# Patient Record
Sex: Male | Born: 1940
Health system: Southern US, Community
[De-identification: ages and names within clinical notes are randomized; demographics above are authoritative.]

## PROBLEM LIST (undated history)

## (undated) DIAGNOSIS — I10 Essential (primary) hypertension: Secondary | ICD-10-CM

## (undated) DIAGNOSIS — I6529 Occlusion and stenosis of unspecified carotid artery: Secondary | ICD-10-CM

## (undated) DIAGNOSIS — I639 Cerebral infarction, unspecified: Secondary | ICD-10-CM

## (undated) DIAGNOSIS — K219 Gastro-esophageal reflux disease without esophagitis: Secondary | ICD-10-CM

## (undated) HISTORY — DX: Occlusion and stenosis of unspecified carotid artery: I65.29

## (undated) HISTORY — PX: PACEMAKER IMPLANT: EP1218

## (undated) HISTORY — PX: TONSILLECTOMY: SUR1361

## (undated) HISTORY — PX: HERNIA REPAIR: SHX51

## (undated) HISTORY — PX: CATARACT EXTRACTION, BILATERAL: SHX1313

---

## 2020-09-11 ENCOUNTER — Emergency Department (HOSPITAL_COMMUNITY): Payer: Medicare Other

## 2020-09-11 ENCOUNTER — Other Ambulatory Visit: Payer: Self-pay

## 2020-09-11 ENCOUNTER — Encounter (HOSPITAL_COMMUNITY): Payer: Self-pay

## 2020-09-11 ENCOUNTER — Inpatient Hospital Stay (HOSPITAL_COMMUNITY)
Admission: EM | Admit: 2020-09-11 | Discharge: 2020-09-14 | DRG: 069 | Disposition: A | Discharge status: Home / Self Care | Payer: Medicare Other | Attending: Internal Medicine | Admitting: Internal Medicine

## 2020-09-11 DIAGNOSIS — Z950 Presence of cardiac pacemaker: Secondary | ICD-10-CM

## 2020-09-11 DIAGNOSIS — R4701 Aphasia: Secondary | ICD-10-CM | POA: Diagnosis present

## 2020-09-11 DIAGNOSIS — E785 Hyperlipidemia, unspecified: Secondary | ICD-10-CM | POA: Diagnosis present

## 2020-09-11 DIAGNOSIS — I10 Essential (primary) hypertension: Secondary | ICD-10-CM | POA: Diagnosis present

## 2020-09-11 DIAGNOSIS — K219 Gastro-esophageal reflux disease without esophagitis: Secondary | ICD-10-CM | POA: Diagnosis present

## 2020-09-11 DIAGNOSIS — G459 Transient cerebral ischemic attack, unspecified: Secondary | ICD-10-CM | POA: Diagnosis not present

## 2020-09-11 DIAGNOSIS — D649 Anemia, unspecified: Secondary | ICD-10-CM | POA: Diagnosis present

## 2020-09-11 DIAGNOSIS — Z87891 Personal history of nicotine dependence: Secondary | ICD-10-CM

## 2020-09-11 DIAGNOSIS — Z20822 Contact with and (suspected) exposure to covid-19: Secondary | ICD-10-CM | POA: Diagnosis present

## 2020-09-11 DIAGNOSIS — Z823 Family history of stroke: Secondary | ICD-10-CM

## 2020-09-11 DIAGNOSIS — I6523 Occlusion and stenosis of bilateral carotid arteries: Secondary | ICD-10-CM | POA: Diagnosis present

## 2020-09-11 DIAGNOSIS — I4892 Unspecified atrial flutter: Secondary | ICD-10-CM | POA: Diagnosis present

## 2020-09-11 DIAGNOSIS — I480 Paroxysmal atrial fibrillation: Secondary | ICD-10-CM | POA: Diagnosis present

## 2020-09-11 DIAGNOSIS — I471 Supraventricular tachycardia: Secondary | ICD-10-CM | POA: Diagnosis not present

## 2020-09-11 HISTORY — DX: Gastro-esophageal reflux disease without esophagitis: K21.9

## 2020-09-11 HISTORY — DX: Essential (primary) hypertension: I10

## 2020-09-11 LAB — CBC
HCT: 31.5 % — ABNORMAL LOW (ref 39.0–52.0)
Hemoglobin: 10.9 g/dL — ABNORMAL LOW (ref 13.0–17.0)
MCH: 34.1 pg — ABNORMAL HIGH (ref 26.0–34.0)
MCHC: 34.6 g/dL (ref 30.0–36.0)
MCV: 98.4 fL (ref 80.0–100.0)
Platelets: 156 10*3/uL (ref 150–400)
RBC: 3.2 MIL/uL — ABNORMAL LOW (ref 4.22–5.81)
RDW: 13.2 % (ref 11.5–15.5)
WBC: 4.1 10*3/uL (ref 4.0–10.5)
nRBC: 0 % (ref 0.0–0.2)

## 2020-09-11 LAB — I-STAT CHEM 8, ED
BUN: 29 mg/dL — ABNORMAL HIGH (ref 8–23)
Calcium, Ion: 1.12 mmol/L — ABNORMAL LOW (ref 1.15–1.40)
Chloride: 106 mmol/L (ref 98–111)
Creatinine, Ser: 1.2 mg/dL (ref 0.61–1.24)
Glucose, Bld: 112 mg/dL — ABNORMAL HIGH (ref 70–99)
HCT: 29 % — ABNORMAL LOW (ref 39.0–52.0)
Hemoglobin: 9.9 g/dL — ABNORMAL LOW (ref 13.0–17.0)
Potassium: 4 mmol/L (ref 3.5–5.1)
Sodium: 139 mmol/L (ref 135–145)
TCO2: 23 mmol/L (ref 22–32)

## 2020-09-11 LAB — COMPREHENSIVE METABOLIC PANEL
ALT: 23 U/L (ref 0–44)
AST: 24 U/L (ref 15–41)
Albumin: 3.4 g/dL — ABNORMAL LOW (ref 3.5–5.0)
Alkaline Phosphatase: 86 U/L (ref 38–126)
Anion gap: 7 (ref 5–15)
BUN: 30 mg/dL — ABNORMAL HIGH (ref 8–23)
CO2: 23 mmol/L (ref 22–32)
Calcium: 8.5 mg/dL — ABNORMAL LOW (ref 8.9–10.3)
Chloride: 105 mmol/L (ref 98–111)
Creatinine, Ser: 1.21 mg/dL (ref 0.61–1.24)
GFR, Estimated: >60 mL/min (ref >60)
Glucose, Bld: 114 mg/dL — ABNORMAL HIGH (ref 70–99)
Potassium: 4 mmol/L (ref 3.5–5.1)
Sodium: 135 mmol/L (ref 135–145)
Total Bilirubin: 0.9 mg/dL (ref 0.3–1.2)
Total Protein: 5.7 g/dL — ABNORMAL LOW (ref 6.5–8.1)

## 2020-09-11 LAB — CBG MONITORING, ED: Glucose-Capillary: 106 mg/dL — ABNORMAL HIGH (ref 70–99)

## 2020-09-11 LAB — DIFFERENTIAL
Abs Immature Granulocytes: 0.01 10*3/uL (ref 0.00–0.07)
Basophils Absolute: 0 10*3/uL (ref 0.0–0.1)
Basophils Relative: 1 %
Eosinophils Absolute: 0 10*3/uL (ref 0.0–0.5)
Eosinophils Relative: 0 %
Immature Granulocytes: 0 %
Lymphocytes Relative: 40 %
Lymphs Abs: 1.6 10*3/uL (ref 0.7–4.0)
Monocytes Absolute: 0.4 10*3/uL (ref 0.1–1.0)
Monocytes Relative: 10 %
Neutro Abs: 2 10*3/uL (ref 1.7–7.7)
Neutrophils Relative %: 49 %

## 2020-09-11 MED ORDER — IOHEXOL 350 MG/ML SOLN
100.0000 mL | Freq: Once | INTRAVENOUS | Status: AC | PRN
  Administered 2020-09-11: 100 mL via INTRAVENOUS

## 2020-09-11 NOTE — ED Triage Notes (Signed)
Patient brought in by ems LKW 2000 patient had a headache and was unable to find his words wife noted slurred speech as well around 2030 EMS arrival 2053 and noticed that speech was starting to clear up. Family history of TIA and Strokes.

## 2020-09-11 NOTE — ED Provider Notes (Signed)
[facility] EMERGENCY DEPARTMENT Provider Note   CSN: [id number] Arrival date & time: 09/11/20  2145     History Chief Complaint  Patient presents with  . Headache  . Aphasia    [name] is a 79 y.o. male.  Patient presents to the emergency department with a chief complaint of garbled speech.  He states the symptoms occurred at approximately 8 PM tonight.  The symptoms lasted for 20 to 30 minutes.  Symptoms were preceded by mild headache.  He denies having had numbness, weakness, or tingling in his extremities.  Denies having had any vision changes.  Denies any chest pain, shortness of breath, difficulty swallowing.  He denies any history of TIA or stroke.  He does have family history of strokes.  He takes a low-dose aspirin.  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  The history is provided by the patient. No language interpreter was used.       Past Medical History:  Diagnosis Date  . GERD (gastroesophageal reflux disease)   . Hypertension     There are no problems to display for this patient.   Past Surgical History:  Procedure Laterality Date  . HERNIA REPAIR    . PACEMAKER IMPLANT    . TONSILLECTOMY         No family history on file.  Social History   Tobacco Use  . Smoking status: Not on file  Substance Use Topics  . Alcohol use: Yes  . Drug use: Not on file    Home Medications Prior to Admission medications   Not on File    Allergies    Patient has no known allergies.  Review of Systems   Review of Systems  All other systems reviewed and are negative.   Physical Exam Updated Vital Signs BP (!) 145/61 (BP Location: Right Arm)   Pulse 60   Temp 98.6 F (37 C) (Oral)   Resp 18   Ht 5\' 10"  (1.778 m)   Wt 72.6 kg   SpO2 98%   BMI 22.96 kg/m   Physical Exam Vitals and nursing note reviewed.  Constitutional:      Appearance: He is well-developed.  HENT:     Head: Normocephalic and atraumatic.  Eyes:      Conjunctiva/sclera: Conjunctivae normal.  Cardiovascular:     Rate and Rhythm: Normal rate and regular rhythm.     Heart sounds: No murmur heard.   Pulmonary:     Effort: Pulmonary effort is normal. No respiratory distress.     Breath sounds: Normal breath sounds.  Abdominal:     Palpations: Abdomen is soft.     Tenderness: There is no abdominal tenderness.  Musculoskeletal:     Cervical back: Neck supple.  Skin:    General: Skin is warm and dry.  Neurological:     Mental Status: He is alert.     Comments: CN III-XII intact, speech is clear, normal finger-to-nose, no pronator drift, range of motion and strength of extremities is 5/5     ED Results / Procedures / Treatments   Labs (all labs ordered are listed, but only abnormal results are displayed) Labs Reviewed  CBG MONITORING, ED - Abnormal; Notable for the following components:      Result Value   Glucose-Capillary 106 (*)    All other components within normal limits    EKG None  Radiology No results found.  Procedures Procedures (including critical care time)  Medications Ordered in  ED Medications - No data to display  ED Course  I have reviewed the triage vital signs and the nursing notes.  Pertinent labs & imaging results that were available during my care of the patient were reviewed by me and considered in my medical decision making (see chart for details).  Clinical Course as of Sep 11 2229  Sat Sep 11, 2020  2216 This is an 50-year-old male present emerge department garbled speech for approximately 20 minutes this evening.  He reports he was feeling well at about 6 PM, and was talking to his wife and his speech became confused.  He said he was able to get the words out but he was mixing up words.  He denies any numbness or weakness at the time.  He denies this ever happened before.  Denies any history of stroke or TIA.  He reports he since returned to his normal baseline mental status, which is also  confirmed by his wife.  On exam he is in no acute distress.  He has a completely benign neurological exam, with no expressive or receptive aphasia.  This presentation continues to be concerning for TIA. He has no active symptoms of stroke at this time.  We'll obtain CT imaging, discuss with neurology, and keep a close eye on him   [MT]  2218 Glucose-Capillary(!): 106 [MT]    Clinical Course User Index [MT] [name], MD   MDM Rules/Calculators/A&P                          This patient complains of garbled speech, this involves an extensive number of treatment options, and is a complaint that carries with it a high risk of complications and morbidity.    Differential Dx TIA, stroke, intoxication, hypoglycemia  Pertinent Labs I ordered, reviewed, and interpreted labs, which included CBG, CBC, CMP, ethanol.  Labs are fairly reassuring.  Imaging Interpretation I ordered imaging studies which included CTA head and neck, which showed no evidence of emergent large vessel occlusion.  Other findings as listed above..   Sources Additional history obtained from spouse, states that patient had trouble saying the right word for about 20 to 30 minutes.  Consultants Dr. Thomasena Edis- Neurology - Recommends adding CTA head and neck.  Will see the patient.  Plan Admit for TIA work-up.    Final Clinical Impression(s) / ED Diagnoses Final diagnoses:  TIA (transient ischemic attack)    Rx / DC Orders ED Discharge Orders    None       [name], PA-C 09/12/20 [id number]    [name], MD 09/12/20 1115

## 2020-09-12 ENCOUNTER — Encounter (HOSPITAL_COMMUNITY): Payer: Self-pay | Admitting: Neurology

## 2020-09-12 ENCOUNTER — Observation Stay (HOSPITAL_COMMUNITY): Payer: Medicare Other

## 2020-09-12 DIAGNOSIS — Z20822 Contact with and (suspected) exposure to covid-19: Secondary | ICD-10-CM | POA: Diagnosis present

## 2020-09-12 DIAGNOSIS — I6389 Other cerebral infarction: Secondary | ICD-10-CM

## 2020-09-12 DIAGNOSIS — I471 Supraventricular tachycardia: Secondary | ICD-10-CM | POA: Diagnosis not present

## 2020-09-12 DIAGNOSIS — Z87891 Personal history of nicotine dependence: Secondary | ICD-10-CM | POA: Diagnosis not present

## 2020-09-12 DIAGNOSIS — G459 Transient cerebral ischemic attack, unspecified: Secondary | ICD-10-CM | POA: Diagnosis present

## 2020-09-12 DIAGNOSIS — E785 Hyperlipidemia, unspecified: Secondary | ICD-10-CM | POA: Diagnosis present

## 2020-09-12 DIAGNOSIS — R4701 Aphasia: Secondary | ICD-10-CM | POA: Diagnosis present

## 2020-09-12 DIAGNOSIS — I482 Chronic atrial fibrillation, unspecified: Secondary | ICD-10-CM | POA: Diagnosis not present

## 2020-09-12 DIAGNOSIS — Z823 Family history of stroke: Secondary | ICD-10-CM | POA: Diagnosis not present

## 2020-09-12 DIAGNOSIS — I6523 Occlusion and stenosis of bilateral carotid arteries: Secondary | ICD-10-CM | POA: Diagnosis present

## 2020-09-12 DIAGNOSIS — I351 Nonrheumatic aortic (valve) insufficiency: Secondary | ICD-10-CM

## 2020-09-12 DIAGNOSIS — I4892 Unspecified atrial flutter: Secondary | ICD-10-CM | POA: Diagnosis present

## 2020-09-12 DIAGNOSIS — D649 Anemia, unspecified: Secondary | ICD-10-CM | POA: Diagnosis present

## 2020-09-12 DIAGNOSIS — I480 Paroxysmal atrial fibrillation: Secondary | ICD-10-CM | POA: Diagnosis present

## 2020-09-12 DIAGNOSIS — K219 Gastro-esophageal reflux disease without esophagitis: Secondary | ICD-10-CM | POA: Diagnosis present

## 2020-09-12 DIAGNOSIS — I6522 Occlusion and stenosis of left carotid artery: Secondary | ICD-10-CM | POA: Diagnosis not present

## 2020-09-12 DIAGNOSIS — Z950 Presence of cardiac pacemaker: Secondary | ICD-10-CM

## 2020-09-12 DIAGNOSIS — R4182 Altered mental status, unspecified: Secondary | ICD-10-CM | POA: Diagnosis not present

## 2020-09-12 DIAGNOSIS — I10 Essential (primary) hypertension: Secondary | ICD-10-CM | POA: Diagnosis present

## 2020-09-12 LAB — LIPID PANEL
Cholesterol: 110 mg/dL (ref 0–200)
HDL: 36 mg/dL — ABNORMAL LOW (ref >40)
LDL Cholesterol: 58 mg/dL (ref 0–99)
Total CHOL/HDL Ratio: 3.1 RATIO
Triglycerides: 80 mg/dL (ref <150)
VLDL: 16 mg/dL (ref 0–40)

## 2020-09-12 LAB — RETICULOCYTES
Immature Retic Fract: 13.4 % (ref 2.3–15.9)
RBC.: 3.21 MIL/uL — ABNORMAL LOW (ref 4.22–5.81)
Retic Count, Absolute: 28.6 10*3/uL (ref 19.0–186.0)
Retic Ct Pct: 0.9 % (ref 0.4–3.1)

## 2020-09-12 LAB — RAPID URINE DRUG SCREEN, HOSP PERFORMED
Amphetamines: NOT DETECTED
Barbiturates: NOT DETECTED
Benzodiazepines: NOT DETECTED
Cocaine: NOT DETECTED
Opiates: NOT DETECTED
Tetrahydrocannabinol: NOT DETECTED

## 2020-09-12 LAB — URINALYSIS, ROUTINE W REFLEX MICROSCOPIC
Bilirubin Urine: NEGATIVE
Glucose, UA: NEGATIVE mg/dL
Hgb urine dipstick: NEGATIVE
Ketones, ur: NEGATIVE mg/dL
Leukocytes,Ua: NEGATIVE
Nitrite: NEGATIVE
Protein, ur: NEGATIVE mg/dL
Specific Gravity, Urine: 1.017 (ref 1.005–1.030)
pH: 7 (ref 5.0–8.0)

## 2020-09-12 LAB — ECHOCARDIOGRAM COMPLETE
AR max vel: 2.52 cm2
AV Area VTI: 2.27 cm2
AV Area mean vel: 2.08 cm2
AV Mean grad: 4 mmHg
AV Peak grad: 6.2 mmHg
Ao pk vel: 1.24 m/s
Area-P 1/2: 5.97 cm2
Height: 70 in
P 1/2 time: 458 msec
S' Lateral: 3.8 cm
Weight: 2560 oz

## 2020-09-12 LAB — ETHANOL: Alcohol, Ethyl (B): <10 mg/dL (ref <10)

## 2020-09-12 LAB — HEMOGLOBIN A1C
Hgb A1c MFr Bld: 5.4 % (ref 4.8–5.6)
Mean Plasma Glucose: 108.28 mg/dL

## 2020-09-12 LAB — RESPIRATORY PANEL BY RT PCR (FLU A&B, COVID)
Influenza A by PCR: NEGATIVE
Influenza B by PCR: NEGATIVE
SARS Coronavirus 2 by RT PCR: NEGATIVE

## 2020-09-12 LAB — PROTIME-INR
INR: 1.1 (ref 0.8–1.2)
Prothrombin Time: 13.5 seconds (ref 11.4–15.2)

## 2020-09-12 LAB — APTT: aPTT: 37 seconds — ABNORMAL HIGH (ref 24–36)

## 2020-09-12 MED ORDER — DM-GUAIFENESIN ER 30-600 MG PO TB12
1.0000 | ORAL_TABLET | Freq: Two times a day (BID) | ORAL | Status: DC | PRN
  Filled 2020-09-12: qty 1

## 2020-09-12 MED ORDER — ENOXAPARIN SODIUM 40 MG/0.4ML ~~LOC~~ SOLN
40.0000 mg | SUBCUTANEOUS | Status: DC
  Administered 2020-09-12 – 2020-09-14 (×3): 40 mg via SUBCUTANEOUS
  Filled 2020-09-12 (×3): qty 0.4

## 2020-09-12 MED ORDER — SENNOSIDES-DOCUSATE SODIUM 8.6-50 MG PO TABS: 1.0000 | ORAL_TABLET | Freq: Every evening | ORAL | Status: DC | PRN

## 2020-09-12 MED ORDER — ASPIRIN EC 81 MG PO TBEC
81.0000 mg | DELAYED_RELEASE_TABLET | Freq: Every day | ORAL | Status: DC
  Administered 2020-09-12 – 2020-09-14 (×3): 81 mg via ORAL
  Filled 2020-09-12 (×3): qty 1

## 2020-09-12 MED ORDER — ACETAMINOPHEN 325 MG PO TABS: 650.0000 mg | ORAL_TABLET | ORAL | Status: DC | PRN

## 2020-09-12 MED ORDER — ACETAMINOPHEN 160 MG/5ML PO SOLN: 650.0000 mg | ORAL | Status: DC | PRN

## 2020-09-12 MED ORDER — STROKE: EARLY STAGES OF RECOVERY BOOK
Freq: Once | Status: AC
  Filled 2020-09-12: qty 1

## 2020-09-12 MED ORDER — ACETAMINOPHEN 650 MG RE SUPP: 650.0000 mg | RECTAL | Status: DC | PRN

## 2020-09-12 MED ORDER — ATORVASTATIN CALCIUM 40 MG PO TABS
40.0000 mg | ORAL_TABLET | Freq: Every day | ORAL | Status: DC
  Administered 2020-09-12 – 2020-09-13 (×2): 40 mg via ORAL
  Filled 2020-09-12 (×2): qty 1

## 2020-09-12 MED ORDER — CLOPIDOGREL BISULFATE 75 MG PO TABS
75.0000 mg | ORAL_TABLET | Freq: Every day | ORAL | Status: DC
  Administered 2020-09-12 – 2020-09-14 (×3): 75 mg via ORAL
  Filled 2020-09-12 (×3): qty 1

## 2020-09-12 NOTE — Progress Notes (Signed)
Talked to [facility] [facility] rep and no atrial fib, did have SVT on 09/01/20 - interrogation will be placed on chart tomorrow.  Dr. Mayford Knife reviewed and one area on the 22nd may have been a fib, will have EP eval the pacer interrogation tomorrow.

## 2020-09-12 NOTE — Evaluation (Signed)
Speech Language Pathology Evaluation Patient Details Name: [name] MRN: [id number] DOB: 08-Nov-1941 Today's Date: 09/12/2020 Time: 7096-2836 SLP Time Calculation (min) (ACUTE ONLY): 15 min  Problem List:  Patient Active Problem List   Diagnosis Date Noted  . 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 Essential hypertension 09/12/2020  . Anemia 09/12/2020  . Aphasia 09/12/2020  . TIA (transient ischemic attack) 09/12/2020   Past Medical History:  Past Medical History:  Diagnosis Date  . GERD (gastroesophageal reflux disease)   . Hypertension    Past Surgical History:  Past Surgical History:  Procedure Laterality Date  . HERNIA REPAIR    . PACEMAKER IMPLANT    . TONSILLECTOMY     HPI:  79 year old with history of arrhythmia status post pacemaker, HTN, GERD admitted for slurred speech.  He follows with a cardiologist in [place].  Most of his work-up was unremarkable, LDL 58, A1c 5.4, UA, UDS both negative.  CTA head and neck negative for any high-grade stenosis or large vessel occlusion but showed left ICA plaque and thyroid nodule   Assessment / Plan / Recommendation Clinical Impression  Pt's language/cognition were assessed. Presents with clear, fluent speech that is back at baseline.  Expressive language is WNL. Comprehension WNL. Follows multistep commands; able to complete higher-level naming tasks.  Reading is WNL.  No aphasia, no persisting deficits. No SLP f/u needed. Reviewed the BE-FAST acronym.  SLP to sign off.     SLP Assessment  SLP Recommendation/Assessment: Patient does not need any further Speech Lanaguage Pathology Services    Follow Up Recommendations       Frequency and Duration           SLP Evaluation Cognition  Overall Cognitive Status: Within Functional Limits for tasks assessed Arousal/Alertness: Awake/alert Orientation Level: Oriented X4 Attention: Alternating Alternating Attention: Appears intact Memory: Appears  intact Safety/Judgment: Appears intact       Comprehension  Auditory Comprehension Overall Auditory Comprehension: Appears within functional limits for tasks assessed Yes/No Questions: Within Functional Limits Commands: Within Functional Limits Conversation: Complex Visual Recognition/Discrimination Discrimination: Within Function Limits Reading Comprehension Reading Status: Within funtional limits    Expression Expression Primary Mode of Expression: Verbal Verbal Expression Overall Verbal Expression: Appears within functional limits for tasks assessed Initiation: No impairment Level of Generative/Spontaneous Verbalization: Conversation Repetition: No impairment Naming: No impairment Pragmatics: No impairment Written Expression Written Expression: Not tested   Oral / Motor  Oral Motor/Sensory Function Overall Oral Motor/Sensory Function: Within functional limits Motor Speech Overall Motor Speech: Appears within functional limits for tasks assessed Respiration: Within functional limits Phonation: Normal Resonance: Within functional limits Articulation: Within functional limitis Intelligibility: Intelligible Motor Planning: Witnin functional limits   GO                    Blenda Mounts Laurice 09/12/2020, 4:35 PM

## 2020-09-12 NOTE — Progress Notes (Signed)
Occupational Therapy Evaluation Patient Details Name: [name] MRN: [id number] DOB: [birth year] Today's Date: 09/12/2020    History of Present Illness Pt presented with slurred speech and word finding difficulty. CT negative. Unable to undergo MRI due to pacer. PMH - pacer, HTN, scoliosis   Clinical Impression   PTA pt living with spouse, functioning at independent community level. Pt and spouse just moved to town for ILF. Their stuff is not yet fully moved in, but they will have a fully accessible apartment to return to post hospitalization. Pt demonstrates ability to complete ADL at mod I level, as well as functional mobility. He is slightly more unsteady than baseline, but able to self correct and be aware when rest breaks are warranted. Reviewed fall prevention strategies with both pt and wife. Reviewed s/s of stroke as well. No further OT needs identified, OT will sign off. Thank you for this consult.     Follow Up Recommendations  No OT follow up    Equipment Recommendations  None recommended by OT    Recommendations for Other Services       Precautions / Restrictions Precautions Precautions: Fall Restrictions Weight Bearing Restrictions: No      Mobility Bed Mobility Overal bed mobility: Modified Independent                  Transfers Overall transfer level: Modified independent                    Balance Overall balance assessment: Needs assistance Sitting-balance support: No upper extremity supported;Feet supported Sitting balance-Leahy Scale: Good     Standing balance support: No upper extremity supported;During functional activity Standing balance-Leahy Scale: Fair                             ADL either performed or assessed with clinical judgement   ADL Overall ADL's : Modified independent                                       General ADL Comments: pt demonstrates ability to complete ADL at mod I level  (increased time and effort)     Vision Patient Visual Report: No change from baseline       Perception     Praxis      Pertinent Vitals/Pain Pain Assessment: No/denies pain     Hand Dominance     Extremity/Trunk Assessment Upper Extremity Assessment Upper Extremity Assessment: Overall WFL for tasks assessed   Lower Extremity Assessment Lower Extremity Assessment: Generalized weakness       Communication Communication Communication: No difficulties   Cognition Arousal/Alertness: Awake/alert Behavior During Therapy: WFL for tasks assessed/performed Overall Cognitive Status: Within Functional Limits for tasks assessed                                     General Comments       Exercises     Shoulder Instructions      Home Living Family/patient expects to be discharged to:: Private residence Living Arrangements: Spouse/significant other Available Help at Discharge: Family;Available 24 hours/day Type of Home: Independent living facility Home Access: Level entry     Home Layout: One level     Bathroom Shower/Tub: Producer, television/film/video: Standard  Home Equipment: None   Additional Comments: Pt and spouse just moved from [place] to independent living apartment in a retirement complex  Lives With: Spouse    Prior Functioning/Environment Level of Independence: Independent                 OT Problem List: Decreased knowledge of use of DME or AE;Decreased activity tolerance;Impaired balance (sitting and/or standing)      OT Treatment/Interventions:      OT Goals(Current goals can be found in the care plan section) Acute Rehab OT Goals Patient Stated Goal: return to new home OT Goal Formulation: All assessment and education complete, DC therapy  OT Frequency:     Barriers to D/C:            Co-evaluation              AM-PAC OT "6 Clicks" Daily Activity     Outcome Measure Help from another person eating  meals?: None Help from another person taking care of personal grooming?: None Help from another person toileting, which includes using toliet, bedpan, or urinal?: None Help from another person bathing (including washing, rinsing, drying)?: None Help from another person to put on and taking off regular upper body clothing?: None Help from another person to put on and taking off regular lower body clothing?: None 6 Click Score: 24   End of Session Nurse Communication: Mobility status  Activity Tolerance: Patient tolerated treatment well Patient left: in chair;with family/visitor present;with call bell/phone within reach  OT Visit Diagnosis: Other abnormalities of gait and mobility (R26.89)                Time: 7209-4709 OT Time Calculation (min): 11 min Charges:  OT General Charges $OT Visit: 1 Visit OT Evaluation $OT Eval Low Complexity: 1 Low  Dalphine Handing, MSOT, OTR/L Acute Rehabilitation Services [facility] Office Number: [phone] Pager: [phone]  Dalphine Handing 09/12/2020, 5:55 PM

## 2020-09-12 NOTE — ED Notes (Signed)
Pt moved to [place]; report given to [place], Charity fundraiser.

## 2020-09-12 NOTE — Consult Note (Signed)
Neurology H&P  CC: word finding difficulty and slurred speech  History is obtained from: patient and his wife  HPI: [name] is a 79 y.o. male right handed recently moved to [facility] from [place] PMHx atrial fibrillation s/p pacemaker implantation 2016 was having dinner and suddenly developed word finding difficulty. Prior to symptoms patient started to feel off with associated headache ~2000 and his wife noticed that he unable to find his words and at times speech did not make sense. The had seen commercials on TV conveying symptoms of stroke and called EMS who noted improvement of speech on arrival at 2053.  His maternal and paternal family history includes TIA and Strokes.  LKW: 2000 tpa given?: No, symptoms resolved. IR Thrombectomy? No,  Modified Rankin Scale: 0-Completely asymptomatic and back to baseline post- stroke NIHSS: 1 very mild phonemic paraphasia.  ROS: A complete ROS was performed and is negative except as noted in the HPI.   Past Medical History:  Diagnosis Date  . GERD (gastroesophageal reflux disease)   . Hypertension    No family history on file.  Social History:  reports current alcohol use. No history on file for tobacco use and drug use.  Prior to Admission medications   Not on File   Exam: Current vital signs: BP 131/65   Pulse 60   Temp 98.6 F (37 C) (Oral)   Resp (!) 8   Ht 5\' 10"  (1.778 m)   Wt 72.6 kg   SpO2 93%   BMI 22.96 kg/m   Physical Exam  Constitutional: Appears well-developed and well-nourished.  Psych: Affect appropriate to situation Eyes: No scleral injection HENT: No OP obstrucion Head: Normocephalic.  Cardiovascular: Normal rate and regular rhythm.  Respiratory: Effort normal and breath sounds normal to anterior ascultation GI: Soft.  No distension. There is no tenderness.  Skin: WDI  Neuro: Mental Status: Patient is awake, alert, oriented to person, place, month, year, and situation. Patient is able to give a  clear and coherent history. Mild phonemic paraphasia. No  neglect Cranial Nerves: II: Visual Fields are full. Pupils are equal, round, and reactive to light.  III,IV, VI: EOMI without ptosis or diploplia.  V: Facial sensation is symmetric to temperature VII: Facial movement is symmetric.  VIII: hearing is intact to voice X: Uvula elevates symmetrically XI: Shoulder shrug is symmetric. XII: tongue is midline without atrophy or fasciculations.  Motor: Tone is normal. Bulk is normal. 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  Sensory: Sensation is symmetric to light touch and temperature in the arms and legs.  Deep Tendon Reflexes: 2+ and symmetric in the biceps and patellae.  Plantars: Toes are downgoing bilaterally.  Cerebellar: FNF and HKS are intact bilaterally.   Assessment: [name] is a 79 y.o. male right handed PMHx atrial fibrillation s/p pacemaker taking aspirin per cardiologist with acute onset aphasia which has mostly resolved. His wife said aphasia was severe and current exam revealed mild phonemic paraphasia and though he reports easy bruising in his arms, at this point benefit of short term clopidogrel outweighs risk.  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  Plan: - MRI brain without contrast. - CTA head and neck pending. - Recommend TTE. - Recommend labs: HbA1c, lipid panel, TSH. - Recommend Statin if LDL > 70 - Aspirin 81mg  daily per his cardiologist. - Clopidogrel 75mg  daily for 3 weeks unless thrombotic etiology is uncovered. - SBP goal -  Permissive hypertension first 24 h < 220/110. Hold home medications for now. - Telemetry monitoring per stroke protocol. - Recommend Stroke education. - Recommend PT/OT/SLP consult.   Electronically signed by: Dr. Marisue Humble Pager: [phone] 09/12/2020, 2:25 AM

## 2020-09-12 NOTE — Procedures (Signed)
Echo attempted. Patient eating. Will attempt again later. 

## 2020-09-12 NOTE — ED Notes (Signed)
ECHO at bedside.

## 2020-09-12 NOTE — Progress Notes (Signed)
Called by RN that pt had 12-13 beat run of V-tach that spontaneously resolved. Pt is asymptomatic. Hemodynamically stable and has no complaints.  Will monitor and if recurs will check potassium and magnesium levels.

## 2020-09-12 NOTE — Progress Notes (Signed)
Physical Therapy Evaluation Patient Details Name: [name] MRN: [id number] DOB: Dec 31, 1940 Today's Date: 09/12/2020   History of Present Illness  Pt presented with slurred speech and word finding difficulty. CT negative. Unable to undergo MRI due to pacer. PMH - pacer, HTN, scoliosis  Clinical Impression  Pt presents to PT with slightly unsteady gait due to illness and inactivity. Expect pt will make good progress back to baseline with mobility. Will follow acutely but doubt pt will need PT after DC.      Follow Up Recommendations No PT follow up    Equipment Recommendations  None recommended by PT    Recommendations for Other Services       Precautions / Restrictions Precautions Precautions: Fall      Mobility  Bed Mobility Overal bed mobility: Modified Independent                  Transfers Overall transfer level: Needs assistance Equipment used: None Transfers: Sit to/from Stand Sit to Stand: Supervision         General transfer comment: For safety and lines  Ambulation/Gait Ambulation/Gait assistance: Min guard Gait Distance (Feet): 175 Feet Assistive device: None Gait Pattern/deviations: Step-through pattern;Decreased stride length Gait velocity: decr Gait velocity interpretation: 1.31 - 2.62 ft/sec, indicative of limited community ambulator General Gait Details: Slightly unsteady gait initially without loss of balance. Improved with distance  Stairs            Wheelchair Mobility    Modified Rankin (Stroke Patients Only)       Balance Overall balance assessment: Needs assistance Sitting-balance support: No upper extremity supported;Feet supported Sitting balance-Leahy Scale: Good     Standing balance support: No upper extremity supported;During functional activity Standing balance-Leahy Scale: Fair                               Pertinent Vitals/Pain Pain Assessment: No/denies pain    Home Living Family/patient  expects to be discharged to:: Private residence Living Arrangements: Spouse/significant other Available Help at Discharge: Family;Available 24 hours/day Type of Home: Independent living facility Home Access: Level entry     Home Layout: One level Home Equipment: None Additional Comments: Pt and spouse just moved from [place] to independent living apartment in a retirement complex    Prior Function Level of Independence: Independent         Comments: Pt drives     Hand Dominance        Extremity/Trunk Assessment   Upper Extremity Assessment Upper Extremity Assessment: Defer to OT evaluation    Lower Extremity Assessment Lower Extremity Assessment: Generalized weakness       Communication   Communication: No difficulties  Cognition Arousal/Alertness: Awake/alert Behavior During Therapy: WFL for tasks assessed/performed Overall Cognitive Status: Within Functional Limits for tasks assessed                                        General Comments      Exercises     Assessment/Plan    PT Assessment Patient needs continued PT services  PT Problem List Decreased strength;Decreased balance;Decreased mobility       PT Treatment Interventions DME instruction;Gait training;Functional mobility training;Therapeutic activities;Therapeutic exercise;Balance training;Patient/family education    PT Goals (Current goals can be found in the Care Plan section)  Acute Rehab PT Goals Patient Stated Goal:  return to new home PT Goal Formulation: With patient Time For Goal Achievement: 09/19/20 Potential to Achieve Goals: Good    Frequency Min 3X/week   Barriers to discharge        Co-evaluation               AM-PAC PT "6 Clicks" Mobility  Outcome Measure Help needed turning from your back to your side while in a flat bed without using bedrails?: None Help needed moving from lying on your back to sitting on the side of a flat bed without using  bedrails?: None Help needed moving to and from a bed to a chair (including a wheelchair)?: A Little Help needed standing up from a chair using your arms (e.g., wheelchair or bedside chair)?: None Help needed to walk in hospital room?: A Little Help needed climbing 3-5 steps with a railing? : A Little 6 Click Score: 21    End of Session   Activity Tolerance: Patient tolerated treatment well Patient left: in chair;with call bell/phone within reach Nurse Communication: Mobility status PT Visit Diagnosis: Unsteadiness on feet (R26.81)    Time: 1007-1219 PT Time Calculation (min) (ACUTE ONLY): 18 min   Charges:   PT Evaluation $PT Eval Low Complexity: 1 Low          [facility] PT Acute Rehabilitation Services Pager [phone] Office [phone]   Angelina Ok [facility] 09/12/2020, 1:55 PM

## 2020-09-12 NOTE — ED Notes (Signed)
Dr. Nelson Chimes messaged regarding patient is unable to get MRI due to having MRI unsafe pacemaker.

## 2020-09-12 NOTE — ED Notes (Signed)
MRI called; pt with pacemaker; unable to do MRI without confirming compatibility of pacer.

## 2020-09-12 NOTE — Progress Notes (Signed)
PROGRESS NOTE    [name]  HDQ:[id number] DOB: 05/19/1941 DOA: 09/11/2020 PCP: Patient, No Pcp Per   Brief Narrative:  79 year old with history of arrhythmia status post pacemaker, HTN, GERD admitted for slurred speech.  He follows with a cardiologist in [place].  Most of his work-up was unremarkable, LDL 58, A1c 5.4, UA, UDS both negative.  CTA head and neck negative for any high-grade stenosis or large vessel occlusion but showed left ICA plaque and thyroid nodule.   Assessment & Plan:   Principal Problem:   Aphasia Active Problems:   Presence of permanent cardiac pacemaker   Essential hypertension   Anemia   Slurred speech probably due to acute TIA/CVA -Concerning for TIA/CVA.  CTA head and neck negative for any large vessel occlusion or high-grade stenosis but showed left ICA plaque -LDL 58, A1c 5.4, UA and UDS-negative -Unable to get MRI as his pacemaker is not safe for MRI -Has [employer] device in place, plans for interrogation today. -Neurology team following -PT/OT -Echocardiogram -Antiplatelet therapy per neurology.  Due to ICA plaque, he should be on statin -N.p.o. until evaluated by speech  Essential hypertension  -Pending medication review by pharmacy -Permissive hypertension for now  GERD  -Pending preadmission med review by pharmacy  Arrhythmia?  With pacemaker in place -Noncompatible with MRI   DVT prophylaxis: enoxaparin (LOVENOX) injection 40 mg Start: 09/12/20 0600  Code Status: Full Family Communication: Wife at bedside    Dispo: The patient is from: Home              Anticipated d/c is to: Home              Anticipated d/c date is: 1 day              Patient currently is not medically stable to d/c.  Ongoing work-up for acute CVA.  Not safe for discharge.       Body mass index is 22.96 kg/m.         Subjective: His symptoms are back to baseline overall feels well.  Does not know if he has any history of A. fib.  Follows  with cardiology in [place] but he moved here couple of days ago.  Review of Systems Otherwise negative except as per HPI, including: General: Denies fever, chills, night sweats or unintended weight loss. Resp: Denies cough, wheezing, shortness of breath. Cardiac: Denies chest pain, palpitations, orthopnea, paroxysmal nocturnal dyspnea. GI: Denies abdominal pain, nausea, vomiting, diarrhea or constipation GU: Denies dysuria, frequency, hesitancy or incontinence MS: Denies muscle aches, joint pain or swelling Neuro: Denies headache, neurologic deficits (focal weakness, numbness, tingling), abnormal gait Psych: Denies anxiety, depression, SI/HI/AVH Skin: Denies new rashes or lesions ID: Denies sick contacts, exotic exposures, travel  Examination:  General exam: Appears calm and comfortable  Respiratory system: Clear to auscultation. Respiratory effort normal. Cardiovascular system: S1 & S2 heard, RRR. No JVD, murmurs, rubs, gallops or clicks. No pedal edema. Gastrointestinal system: Abdomen is nondistended, soft and nontender. No organomegaly or masses felt. Normal bowel sounds heard. Central nervous system: Alert and oriented. No focal neurological deficits. Extremities: Symmetric 5 x 5 power. Skin: No rashes, lesions or ulcers Psychiatry: Judgement and insight appear normal. Mood & affect appropriate.     Objective: Vitals:   09/12/20 0430 09/12/20 0445 09/12/20 0500 09/12/20 0811  BP: 119/63 126/69 125/72 125/66  Pulse: 60 63 (!) 59 (!) 59  Resp: 13 11 12 15   Temp:      TempSrc:  SpO2: 94% 95% 97% 98%  Weight:      Height:       No intake or output data in the 24 hours ending 09/12/20 0838 Filed Weights   09/11/20 2153  Weight: 72.6 kg     Data Reviewed:   CBC: Recent Labs  Lab 09/11/20 2237 09/11/20 2243  WBC 4.1  --   NEUTROABS 2.0  --   HGB 10.9* 9.9*  HCT 31.5* 29.0*  MCV 98.4  --   PLT 156  --    Basic Metabolic Panel: Recent Labs  Lab  09/11/20 2237 09/11/20 2243  NA 135 139  K 4.0 4.0  CL 105 106  CO2 23  --   GLUCOSE 114* 112*  BUN 30* 29*  CREATININE 1.21 1.20  CALCIUM 8.5*  --    GFR: Estimated Creatinine Clearance: 50.4 mL/min (by C-G formula based on SCr of 1.2 mg/dL). Liver Function Tests: Recent Labs  Lab 09/11/20 2237  AST 24  ALT 23  ALKPHOS 86  BILITOT 0.9  PROT 5.7*  ALBUMIN 3.4*   No results for input(s): LIPASE, AMYLASE in the last 168 hours. No results for input(s): AMMONIA in the last 168 hours. Coagulation Profile: Recent Labs  Lab 09/11/20 2237  INR 1.1   Cardiac Enzymes: No results for input(s): CKTOTAL, CKMB, CKMBINDEX, TROPONINI in the last 168 hours. BNP (last 3 results) No results for input(s): PROBNP in the last 8760 hours. HbA1C: Recent Labs    09/12/20 0500  HGBA1C 5.4   CBG: Recent Labs  Lab 09/11/20 2150  GLUCAP 106*   Lipid Profile: Recent Labs    09/12/20 0500  CHOL 110  HDL 36*  LDLCALC 58  TRIG 80  CHOLHDL 3.1   Thyroid Function Tests: No results for input(s): TSH, T4TOTAL, FREET4, T3FREE, THYROIDAB in the last 72 hours. Anemia Panel: Recent Labs    09/12/20 0425  RETICCTPCT 0.9   Sepsis Labs: No results for input(s): PROCALCITON, LATICACIDVEN in the last 168 hours.  No results found for this or any previous visit (from the past 240 hour(s)).       Radiology Studies: CT Angio Head W or Wo Contrast  Result Date: 09/12/2020 CLINICAL DATA:  Stroke/TIA EXAM: CT ANGIOGRAPHY HEAD AND NECK TECHNIQUE: Multidetector CT imaging of the head and neck was performed using the standard protocol during bolus administration of intravenous contrast. Multiplanar CT image reconstructions and MIPs were obtained to evaluate the vascular anatomy. Carotid stenosis measurements (when applicable) are obtained utilizing NASCET criteria, using the distal internal carotid diameter as the denominator. CONTRAST:  100mL OMNIPAQUE IOHEXOL 350 MG/ML SOLN COMPARISON:   None. FINDINGS: CT HEAD FINDINGS Brain: There is no mass, hemorrhage or extra-axial collection. There is generalized atrophy without lobar predilection. There is hypoattenuation of the periventricular white matter, most commonly indicating chronic ischemic microangiopathy. Skull: The visualized skull base, calvarium and extracranial soft tissues are normal. Sinuses/Orbits: No fluid levels or advanced mucosal thickening of the visualized paranasal sinuses. No mastoid or middle ear effusion. The orbits are normal. CTA NECK FINDINGS SKELETON: There is no bony spinal canal stenosis. No lytic or blastic lesion. OTHER NECK: Enlarged heterogeneous right thyroid lobe. UPPER CHEST: No pneumothorax or pleural effusion. No nodules or masses. AORTIC ARCH: There is calcific atherosclerosis of the aortic arch. There is no aneurysm, dissection or hemodynamically significant stenosis of the visualized portion of the aorta. Conventional 3 vessel aortic branching pattern. The visualized proximal subclavian arteries are widely patent. RIGHT CAROTID SYSTEM: No  dissection, occlusion or aneurysm. There is calcified atherosclerosis extending into the proximal ICA, resulting in less than 50% stenosis. LEFT CAROTID SYSTEM: There is eccentric plaque at the left carotid bifurcation extending into the proximal left internal carotid artery. There is no hemodynamically significant stenosis. VERTEBRAL ARTERIES: Left dominant configuration. Both origins are clearly patent. There is no dissection, occlusion or flow-limiting stenosis to the skull base (V1-V3 segments). CTA HEAD FINDINGS POSTERIOR CIRCULATION: --Vertebral arteries: Normal V4 segments. --Inferior cerebellar arteries: Normal. --Basilar artery: Normal. --Superior cerebellar arteries: Normal. --Posterior cerebral arteries (PCA): Normal. ANTERIOR CIRCULATION: --Intracranial internal carotid arteries: Atherosclerotic calcification of the internal carotid arteries at the skull base without  hemodynamically significant stenosis. --Anterior cerebral arteries (ACA): Normal. Both A1 segments are present. Patent anterior communicating artery (a-comm). --Middle cerebral arteries (MCA): Normal. VENOUS SINUSES: As permitted by contrast timing, patent. ANATOMIC VARIANTS: None Review of the MIP images confirms the above findings. IMPRESSION: 1. No emergent large vessel occlusion or high-grade stenosis of the intracranial arteries. 2. Eccentric plaque within the proximal left internal carotid artery may be at increased risk of embolus formation. 3. Bilateral carotid bifurcation atherosclerosis without hemodynamically significant stenosis by NASCET criteria. 4. Enlarged heterogeneous right thyroid lobe. Recommend thyroid ultrasound on a nonemergent basis (ref: J Am Coll Radiol. 2015 Feb;12(2): 143-50). Aortic Atherosclerosis (ICD10-I70.0). Electronically Signed   By: Deatra Robinson M.D.   On: 09/12/2020 03:15   CT Angio Neck W and/or Wo Contrast  Result Date: 09/12/2020 CLINICAL DATA:  Stroke/TIA EXAM: CT ANGIOGRAPHY HEAD AND NECK TECHNIQUE: Multidetector CT imaging of the head and neck was performed using the standard protocol during bolus administration of intravenous contrast. Multiplanar CT image reconstructions and MIPs were obtained to evaluate the vascular anatomy. Carotid stenosis measurements (when applicable) are obtained utilizing NASCET criteria, using the distal internal carotid diameter as the denominator. CONTRAST:  OMNIPAQUE IOHEXOL 350 MG/ML SOLN COMPARISON:  None. FINDINGS: CT HEAD FINDINGS Brain: There is no mass, hemorrhage or extra-axial collection. There is generalized atrophy without lobar predilection. There is hypoattenuation of the periventricular white matter, most commonly indicating chronic ischemic microangiopathy. Skull: The visualized skull base, calvarium and extracranial soft tissues are normal. Sinuses/Orbits: No fluid levels or advanced mucosal thickening of the  visualized paranasal sinuses. No mastoid or middle ear effusion. The orbits are normal. CTA NECK FINDINGS SKELETON: There is no bony spinal canal stenosis. No lytic or blastic lesion. OTHER NECK: Enlarged heterogeneous right thyroid lobe. UPPER CHEST: No pneumothorax or pleural effusion. No nodules or masses. AORTIC ARCH: There is calcific atherosclerosis of the aortic arch. There is no aneurysm, dissection or hemodynamically significant stenosis of the visualized portion of the aorta. Conventional 3 vessel aortic branching pattern. The visualized proximal subclavian arteries are widely patent. RIGHT CAROTID SYSTEM: No dissection, occlusion or aneurysm. There is calcified atherosclerosis extending into the proximal ICA, resulting in less than 50% stenosis. LEFT CAROTID SYSTEM: There is eccentric plaque at the left carotid bifurcation extending into the proximal left internal carotid artery. There is no hemodynamically significant stenosis. VERTEBRAL ARTERIES: Left dominant configuration. Both origins are clearly patent. There is no dissection, occlusion or flow-limiting stenosis to the skull base (V1-V3 segments). CTA HEAD FINDINGS POSTERIOR CIRCULATION: --Vertebral arteries: Normal V4 segments. --Inferior cerebellar arteries: Normal. --Basilar artery: Normal. --Superior cerebellar arteries: Normal. --Posterior cerebral arteries (PCA): Normal. ANTERIOR CIRCULATION: --Intracranial internal carotid arteries: Atherosclerotic calcification of the internal carotid arteries at the skull base without hemodynamically significant stenosis. --Anterior cerebral arteries (ACA): Normal. Both A1 segments are present.  Patent anterior communicating artery (a-comm). --Middle cerebral arteries (MCA): Normal. VENOUS SINUSES: As permitted by contrast timing, patent. ANATOMIC VARIANTS: None Review of the MIP images confirms the above findings. IMPRESSION: 1. No emergent large vessel occlusion or high-grade stenosis of the intracranial  arteries. 2. Eccentric plaque within the proximal left internal carotid artery may be at increased risk of embolus formation. 3. Bilateral carotid bifurcation atherosclerosis without hemodynamically significant stenosis by NASCET criteria. 4. Enlarged heterogeneous right thyroid lobe. Recommend thyroid ultrasound on a nonemergent basis (ref: J Am Coll Radiol. 2015 Feb;12(2): 143-50). Aortic Atherosclerosis (ICD10-I70.0). Electronically Signed   By: Deatra Robinson M.D.   On: 09/12/2020 03:15        Scheduled Meds: . aspirin EC  81 mg Oral Daily  . clopidogrel  75 mg Oral Daily  . enoxaparin (LOVENOX) injection  40 mg Subcutaneous Q24H   Continuous Infusions:   LOS: 0 days   Time spent= 35 mins    Laurna Shetley Joline Maxcy, MD [facility]  If 7PM-7AM, please contact night-coverage  09/12/2020, 8:38 AM

## 2020-09-12 NOTE — Progress Notes (Signed)
STROKE TEAM PROGRESS NOTE   INTERVAL HISTORY His wife and Dr. Nelson Chimes are at the bedside.  Patient had 1 episode of speech difficulty last night, lasting 20 to 25 minutes, and then fully resolved.  Patient just moved from [place] to [place] 2 days ago, has not established any cardiac care yet in [place].  He is not sure whether or not he has A. fib, will do pacemaker interrogation.  His wife is going to give [place] his medication list.  OBJECTIVE Vitals:   09/12/20 0811 09/12/20 0900 09/12/20 0945 09/12/20 1000  BP: 125/66 (!) 148/70 (!) 156/75 (!) 147/76  Pulse: (!) 59 (!) 59 61 (!) 59  Resp: 15 15 12 16   Temp:      TempSrc:      SpO2: 98% 99% 99% 99%  Weight:      Height:        CBC:  Recent Labs  Lab 09/11/20 2237 09/11/20 2243  WBC 4.1  --   NEUTROABS 2.0  --   HGB 10.9* 9.9*  HCT 31.5* 29.0*  MCV 98.4  --   PLT 156  --     Basic Metabolic Panel:  Recent Labs  Lab 09/11/20 2237 09/11/20 2243  NA 135 139  K 4.0 4.0  CL 105 106  CO2 23  --   GLUCOSE 114* 112*  BUN 30* 29*  CREATININE 1.21 1.20  CALCIUM 8.5*  --     Lipid Panel:     Component Value Date/Time   CHOL 110 09/12/2020 0500   TRIG 80 09/12/2020 0500   HDL 36 (L) 09/12/2020 0500   CHOLHDL 3.1 09/12/2020 0500   VLDL 16 09/12/2020 0500   LDLCALC 58 09/12/2020 0500   HgbA1c:  Lab Results  Component Value Date   HGBA1C 5.4 09/12/2020   Urine Drug Screen:     Component Value Date/Time   LABOPIA NONE DETECTED 09/12/2020 0051   COCAINSCRNUR NONE DETECTED 09/12/2020 0051   LABBENZ NONE DETECTED 09/12/2020 0051   AMPHETMU NONE DETECTED 09/12/2020 0051   THCU NONE DETECTED 09/12/2020 0051   LABBARB NONE DETECTED 09/12/2020 0051    Alcohol Level     Component Value Date/Time   ETH <10 09/12/2020 0110    IMAGING  CT Angio Head W or Wo Contrast CT Angio Neck W and/or Wo Contrast 09/12/2020 IMPRESSION:  1. No emergent large vessel occlusion or high-grade stenosis of the intracranial  arteries.  2. Eccentric plaque within the proximal left internal carotid artery may be at increased risk of embolus formation.  3. Bilateral carotid bifurcation atherosclerosis without hemodynamically significant stenosis by NASCET criteria.  4. Enlarged heterogeneous right thyroid lobe. Recommend thyroid ultrasound on a nonemergent basis (ref: J Am Coll Radiol. 2015 Feb;12(2): 143-50).  Aortic Atherosclerosis (ICD10-I70.0).   Transthoracic Echocardiogram  00/00/2021 Pending  ECG - paced rate 62 BPM. (See cardiology reading for complete details)  PHYSICAL EXAM  Temp:  [98.6 F (37 C)] 98.6 F (37 C) (10/30 2150) Pulse Rate:  [59-63] 63 (10/31 1100) Resp:  [8-18] 15 (10/31 1100) BP: (119-156)/(57-80) 138/80 (10/31 1100) SpO2:  [93 %-100 %] 100 % (10/31 1100) Weight:  [72.6 kg] 72.6 kg (10/30 2153)  General - Well nourished, well developed, in no apparent distress.  Ophthalmologic - fundi not visualized due to noncooperation.  Cardiovascular - Regular rhythm and rate.  Mental Status -  Level of arousal and orientation to time, place, and person were intact. Language including expression, naming, repetition, comprehension was assessed and  found intact. Fund of Knowledge was assessed and was intact.  Cranial Nerves II - XII - II - Visual field intact OU. III, IV, VI - Extraocular movements intact. V - Facial sensation intact bilaterally. VII - Facial movement intact bilaterally. VIII - Hearing & vestibular intact bilaterally. X - Palate elevates symmetrically. XI - Chin turning & shoulder shrug intact bilaterally. XII - Tongue protrusion intact.  Motor Strength - The patient's strength was normal in all extremities and pronator drift was absent.  Bulk was normal and fasciculations were absent.   Motor Tone - Muscle tone was assessed at the neck and appendages and was normal.  Reflexes - The patient's reflexes were symmetrical in all extremities and he had no pathological  reflexes.  Sensory - Light touch, temperature/pinprick were assessed and were symmetrical.    Coordination - The patient had normal movements in the hands with no ataxia or dysmetria.  Tremor was absent.  Gait and Station - deferred.    ASSESSMENT/PLAN [name] is a 79 y.o. male who recently moved to [facility] from [place] with a PMHx of Htn, GERD, ? atrial fibrillation, bradycardia s/p pacemaker in 2016. The pt was having dinner and suddenly developed word finding difficulty. Prior to symptoms patient started to feel off with associated headache ~2000 and his wife noticed that he unable to find his words and at times speech did not make sense. He did not receive IV t-PA due to improvement in deficits.  TIA secondary to ?? AF not on AC vs. Soft plaque at left ICA bulb  CT head - no acute abnormality  MRI head - not able to do due to pacer not compatible with MRI  CTA H&N - No emergent large vessel occlusion or high-grade stenosis of the intracranial arteries. Eccentric plaque within the proximal left internal carotid artery may be at increased risk of embolus formation. Bilateral carotid bifurcation atherosclerosis without hemodynamically significant stenosis by NASCET criteria.   CT repeat in a.m.  2D Echo - pending  Pacemaker interrogation pending  Loyal Jacobson Virus 2 - pending  LDL - 58  HgbA1c - 5.4  UDS - negative  VTE prophylaxis - Lovenox  aspirin 81 mg daily prior to admission, now on aspirin 81 mg daily and clopidogrel 75 mg daily DAPT.   Patient counseled to be compliant with his antithrombotic medications  Ongoing aggressive stroke risk factor management  Therapy recommendations:  pending  Disposition:  Pending  ?? PAF  A. fib listed in his medical problem list  Patient seems not aware of it  Pacemaker interrogation pending  May request medical records from his cardiologist in [place]  On DAPT for now  If PAF confirmed, will need to  consider DOAC  Hypertension  Home BP meds: unclear  Current BP meds: none   Stable . Long-term BP goal normotensive  Hyperlipidemia  Home Lipid lowering medication: statin?  LDL 58, goal < 70  Current lipid lowering medication: Lipitor 40 mg daily   Continue statin at discharge  Other Stroke Risk Factors  Advanced age  ETOH use, advised to drink no more than 1 alcoholic beverage per day.  Family hx strokes  Other Active Problems  Code status - Full code   Anemia - Hgb - 10.9->9.9   Aortic Atherosclerosis (ICD10-I70.0)  Bradycardia s/p pacer  Hospital day # 0  Marvel Plan, MD PhD Stroke Neurology 09/12/2020 12:23 PM   To contact Stroke Continuity provider, please refer to [email or web address]. After hours, contact General  Neurology

## 2020-09-12 NOTE — ED Notes (Signed)
Bedside swallow eval was performed per MD request, this was noted in the last progress note read. Pt passed, diet will be ordered.

## 2020-09-12 NOTE — H&P (Signed)
History and Physical    [name] GXQ:[id number] DOB: [birth year] DOA: 09/11/2020  PCP: Patient, No Pcp Per Patient coming from: Home  Chief Complaint: Slurred speech, word finding difficulty  HPI: [name] is a 79 y.o. male with medical history significant of arrhythmia status post pacemaker implantation in 2016, hypertension, GERD presented to ED with complaints of slurred speech and word finding difficulty.  Patient states around 9 PM he had slurred speech and word finding difficulty.  No blurry vision.  No drooping of the face.  No focal weakness or numbness.  Denies history of prior strokes.  States he is from [place] and recently moved to [place].  He is seen by a cardiologist back in [place] as he has a pacemaker which patient states was placed because his heart rate was too slow.  Also reports history of hypertension.  He quit smoking 50 years ago.  He has no other complaints.  ED Course: Hemodynamically stable.  WBC 4.1, hemoglobin 10.9, hematocrit 31.5, MCV 98.4, platelet 156k.  Sodium 135, potassium 4.0, chloride 105, bicarb 23, BUN 30, creatinine 1.2, glucose 114.  LFTs normal.  INR 1.1.  Blood ethanol level pending.  UDS negative.  UA without signs of infection.  Neurology consulted.  CTA head and neck negative for LVO or high-grade stenosis.  Review of Systems:  All systems reviewed and apart from history of presenting illness, are negative.  Past Medical History:  Diagnosis Date  . GERD (gastroesophageal reflux disease)   . Hypertension     Past Surgical History:  Procedure Laterality Date  . HERNIA REPAIR    . PACEMAKER IMPLANT    . TONSILLECTOMY       reports current alcohol use. No history on file for tobacco use and drug use.  No Known Allergies  History reviewed. No pertinent family history.  Prior to Admission medications   Not on File    Physical Exam: Vitals:   09/11/20 2300 09/11/20 2315 09/11/20 2330 09/12/20 0305  BP: (!) 143/59 129/60  131/65 129/61  Pulse: 60 60 60 60  Resp: 17 (!) 8 (!) 8 14  Temp:      TempSrc:      SpO2: 96% 96% 93% 96%  Weight:      Height:        Physical Exam Constitutional:      General: He is not in acute distress. HENT:     Head: Normocephalic and atraumatic.  Eyes:     Extraocular Movements: Extraocular movements intact.     Pupils: Pupils are equal, round, and reactive to light.  Cardiovascular:     Rate and Rhythm: Normal rate and regular rhythm.     Pulses: Normal pulses.  Pulmonary:     Effort: Pulmonary effort is normal. No respiratory distress.     Breath sounds: Normal breath sounds. No wheezing or rales.  Abdominal:     General: Bowel sounds are normal. There is no distension.     Palpations: Abdomen is soft.     Tenderness: There is no abdominal tenderness.  Musculoskeletal:        General: No tenderness.     Cervical back: Normal range of motion and neck supple.     Right lower leg: Edema present.     Left lower leg: Edema present.     Comments: +1 pedal edema  Skin:    General: Skin is warm and dry.  Neurological:     General: No focal deficit present.  Mental Status: He is alert and oriented to person, place, and time.     Sensory: No sensory deficit.     Motor: No weakness.     Labs on Admission: I have personally reviewed following labs and imaging studies  CBC: Recent Labs  Lab 09/11/20 2237 09/11/20 2243  WBC 4.1  --   NEUTROABS 2.0  --   HGB 10.9* 9.9*  HCT 31.5* 29.0*  MCV 98.4  --   PLT 156  --    Basic Metabolic Panel: Recent Labs  Lab 09/11/20 2237 09/11/20 2243  NA 135 139  K 4.0 4.0  CL 105 106  CO2 23  --   GLUCOSE 114* 112*  BUN 30* 29*  CREATININE 1.21 1.20  CALCIUM 8.5*  --    GFR: Estimated Creatinine Clearance: 50.4 mL/min (by C-G formula based on SCr of 1.2 mg/dL). Liver Function Tests: Recent Labs  Lab 09/11/20 2237  AST 24  ALT 23  ALKPHOS 86  BILITOT 0.9  PROT 5.7*  ALBUMIN 3.4*   No results for  input(s): LIPASE, AMYLASE in the last 168 hours. No results for input(s): AMMONIA in the last 168 hours. Coagulation Profile: Recent Labs  Lab 09/11/20 2237  INR 1.1   Cardiac Enzymes: No results for input(s): CKTOTAL, CKMB, CKMBINDEX, TROPONINI in the last 168 hours. BNP (last 3 results) No results for input(s): PROBNP in the last 8760 hours. HbA1C: No results for input(s): HGBA1C in the last 72 hours. CBG: Recent Labs  Lab 09/11/20 2150  GLUCAP 106*   Lipid Profile: No results for input(s): CHOL, HDL, LDLCALC, TRIG, CHOLHDL, LDLDIRECT in the last 72 hours. Thyroid Function Tests: No results for input(s): TSH, T4TOTAL, FREET4, T3FREE, THYROIDAB in the last 72 hours. Anemia Panel: No results for input(s): VITAMINB12, FOLATE, FERRITIN, TIBC, IRON, RETICCTPCT in the last 72 hours. Urine analysis:    Component Value Date/Time   COLORURINE STRAW (A) 09/12/2020 0052   APPEARANCEUR CLEAR 09/12/2020 0052   LABSPEC 1.017 09/12/2020 0052   PHURINE 7.0 09/12/2020 0052   GLUCOSEU NEGATIVE 09/12/2020 0052   HGBUR NEGATIVE 09/12/2020 0052   BILIRUBINUR NEGATIVE 09/12/2020 0052   KETONESUR NEGATIVE 09/12/2020 0052   PROTEINUR NEGATIVE 09/12/2020 0052   NITRITE NEGATIVE 09/12/2020 0052   LEUKOCYTESUR NEGATIVE 09/12/2020 0052    Radiological Exams on Admission: CT Angio Head W or Wo Contrast  Result Date: 09/12/2020 CLINICAL DATA:  Stroke/TIA EXAM: CT ANGIOGRAPHY HEAD AND NECK TECHNIQUE: Multidetector CT imaging of the head and neck was performed using the standard protocol during bolus administration of intravenous contrast. Multiplanar CT image reconstructions and MIPs were obtained to evaluate the vascular anatomy. Carotid stenosis measurements (when applicable) are obtained utilizing NASCET criteria, using the distal internal carotid diameter as the denominator. CONTRAST:  OMNIPAQUE IOHEXOL 350 MG/ML SOLN COMPARISON:  None. FINDINGS: CT HEAD FINDINGS Brain: There is no mass,  hemorrhage or extra-axial collection. There is generalized atrophy without lobar predilection. There is hypoattenuation of the periventricular white matter, most commonly indicating chronic ischemic microangiopathy. Skull: The visualized skull base, calvarium and extracranial soft tissues are normal. Sinuses/Orbits: No fluid levels or advanced mucosal thickening of the visualized paranasal sinuses. No mastoid or middle ear effusion. The orbits are normal. CTA NECK FINDINGS SKELETON: There is no bony spinal canal stenosis. No lytic or blastic lesion. OTHER NECK: Enlarged heterogeneous right thyroid lobe. UPPER CHEST: No pneumothorax or pleural effusion. No nodules or masses. AORTIC ARCH: There is calcific atherosclerosis of the aortic arch. There  is no aneurysm, dissection or hemodynamically significant stenosis of the visualized portion of the aorta. Conventional 3 vessel aortic branching pattern. The visualized proximal subclavian arteries are widely patent. RIGHT CAROTID SYSTEM: No dissection, occlusion or aneurysm. There is calcified atherosclerosis extending into the proximal ICA, resulting in less than 50% stenosis. LEFT CAROTID SYSTEM: There is eccentric plaque at the left carotid bifurcation extending into the proximal left internal carotid artery. There is no hemodynamically significant stenosis. VERTEBRAL ARTERIES: Left dominant configuration. Both origins are clearly patent. There is no dissection, occlusion or flow-limiting stenosis to the skull base (V1-V3 segments). CTA HEAD FINDINGS POSTERIOR CIRCULATION: --Vertebral arteries: Normal V4 segments. --Inferior cerebellar arteries: Normal. --Basilar artery: Normal. --Superior cerebellar arteries: Normal. --Posterior cerebral arteries (PCA): Normal. ANTERIOR CIRCULATION: --Intracranial internal carotid arteries: Atherosclerotic calcification of the internal carotid arteries at the skull base without hemodynamically significant stenosis. --Anterior cerebral  arteries (ACA): Normal. Both A1 segments are present. Patent anterior communicating artery (a-comm). --Middle cerebral arteries (MCA): Normal. VENOUS SINUSES: As permitted by contrast timing, patent. ANATOMIC VARIANTS: None Review of the MIP images confirms the above findings. IMPRESSION: 1. No emergent large vessel occlusion or high-grade stenosis of the intracranial arteries. 2. Eccentric plaque within the proximal left internal carotid artery may be at increased risk of embolus formation. 3. Bilateral carotid bifurcation atherosclerosis without hemodynamically significant stenosis by NASCET criteria. 4. Enlarged heterogeneous right thyroid lobe. Recommend thyroid ultrasound on a nonemergent basis (ref: J Am Coll Radiol. 2015 Feb;12(2): 143-50). Aortic Atherosclerosis (ICD10-I70.0). Electronically Signed   By: Deatra RobinsonKevin  Herman M.D.   On: 09/12/2020 03:15   CT Angio Neck W and/or Wo Contrast  Result Date: 09/12/2020 CLINICAL DATA:  Stroke/TIA EXAM: CT ANGIOGRAPHY HEAD AND NECK TECHNIQUE: Multidetector CT imaging of the head and neck was performed using the standard protocol during bolus administration of intravenous contrast. Multiplanar CT image reconstructions and MIPs were obtained to evaluate the vascular anatomy. Carotid stenosis measurements (when applicable) are obtained utilizing NASCET criteria, using the distal internal carotid diameter as the denominator. CONTRAST:  100mL OMNIPAQUE IOHEXOL 350 MG/ML SOLN COMPARISON:  None. FINDINGS: CT HEAD FINDINGS Brain: There is no mass, hemorrhage or extra-axial collection. There is generalized atrophy without lobar predilection. There is hypoattenuation of the periventricular white matter, most commonly indicating chronic ischemic microangiopathy. Skull: The visualized skull base, calvarium and extracranial soft tissues are normal. Sinuses/Orbits: No fluid levels or advanced mucosal thickening of the visualized paranasal sinuses. No mastoid or middle ear effusion.  The orbits are normal. CTA NECK FINDINGS SKELETON: There is no bony spinal canal stenosis. No lytic or blastic lesion. OTHER NECK: Enlarged heterogeneous right thyroid lobe. UPPER CHEST: No pneumothorax or pleural effusion. No nodules or masses. AORTIC ARCH: There is calcific atherosclerosis of the aortic arch. There is no aneurysm, dissection or hemodynamically significant stenosis of the visualized portion of the aorta. Conventional 3 vessel aortic branching pattern. The visualized proximal subclavian arteries are widely patent. RIGHT CAROTID SYSTEM: No dissection, occlusion or aneurysm. There is calcified atherosclerosis extending into the proximal ICA, resulting in less than 50% stenosis. LEFT CAROTID SYSTEM: There is eccentric plaque at the left carotid bifurcation extending into the proximal left internal carotid artery. There is no hemodynamically significant stenosis. VERTEBRAL ARTERIES: Left dominant configuration. Both origins are clearly patent. There is no dissection, occlusion or flow-limiting stenosis to the skull base (V1-V3 segments). CTA HEAD FINDINGS POSTERIOR CIRCULATION: --Vertebral arteries: Normal V4 segments. --Inferior cerebellar arteries: Normal. --Basilar artery: Normal. --Superior cerebellar arteries: Normal. --Posterior cerebral arteries (  PCA): Normal. ANTERIOR CIRCULATION: --Intracranial internal carotid arteries: Atherosclerotic calcification of the internal carotid arteries at the skull base without hemodynamically significant stenosis. --Anterior cerebral arteries (ACA): Normal. Both A1 segments are present. Patent anterior communicating artery (a-comm). --Middle cerebral arteries (MCA): Normal. VENOUS SINUSES: As permitted by contrast timing, patent. ANATOMIC VARIANTS: None Review of the MIP images confirms the above findings. IMPRESSION: 1. No emergent large vessel occlusion or high-grade stenosis of the intracranial arteries. 2. Eccentric plaque within the proximal left internal  carotid artery may be at increased risk of embolus formation. 3. Bilateral carotid bifurcation atherosclerosis without hemodynamically significant stenosis by NASCET criteria. 4. Enlarged heterogeneous right thyroid lobe. Recommend thyroid ultrasound on a nonemergent basis (ref: J Am Coll Radiol. 2015 Feb;12(2): 143-50). Aortic Atherosclerosis (ICD10-I70.0). Electronically Signed   By: Deatra Robinson M.D.   On: 09/12/2020 03:15    EKG: Independently reviewed.  Sinus rhythm, artifact in multiple leads.  No prior tracing for comparison.  Assessment/Plan Principal Problem:   Aphasia Active Problems:   Presence of permanent cardiac pacemaker   Essential hypertension   Anemia   Mild phonemic aphasia Presenting with complaints of slurred speech and word finding difficulty.  Speech has now improved drastically.  Seen by neurology and exam revealed mild phonemic paraphasia. CTA head and neck negative for LVO or high-grade stenosis. -Neurology following, appreciate recommendations -Telemetry monitoring -Allow for permissive hypertension-treat only if systolic blood pressures are greater than 220/110 -MRI of the brain without contrast -2D echocardiogram -Hemoglobin A1c, fasting lipid panel -Antiplatelet therapy recommendations per neurology: Aspirin 81 mg daily and start Plavix 75 mg daily x3 weeks -Start statin if LDL >70 -Frequent neurochecks -PT, OT, speech therapy. -N.p.o. until cleared by bedside swallow evaluation or formal speech evaluation  Arrhythmia status post PPM implantation in 2016 Followed by cardiology in [place], no records available at this time. -Currently in sinus rhythm  Hypertension -Allow permissive hypertension at this time  Normocytic anemia Hemoglobin 10.9, hematocrit 31.5, MCV 98.4.  No prior labs for comparison.  No signs of active bleeding. -Anemia panel and FOBT  DVT prophylaxis: Lovenox Code Status: Patient wishes to be full code. Family Communication:  Wife at bedside. Disposition Plan: Status is: Observation  The patient remains OBS appropriate and will d/c before 2 midnights.  Dispo: The patient is from: Home              Anticipated d/c is to: Home              Anticipated d/c date is: 2 days              Patient currently is not medically stable to d/c.  The medical decision making on this patient was of high complexity and the patient is at high risk for clinical deterioration, therefore this is a level 3 visit.  John Giovanni MD [facility]  If 7PM-7AM, please contact night-coverage [email or web address]  09/12/2020, 4:29 AM

## 2020-09-12 NOTE — Progress Notes (Signed)
Pt has an MRI UNSAFE pacemaker.   [address] [employer]e Model # [id number] Serial [id number] lead1- 2088 Lead 2- [id number]

## 2020-09-12 NOTE — ED Notes (Signed)
[facility] contacted and provided with pacemaker information. They will research compatibility and advise further.

## 2020-09-12 NOTE — Progress Notes (Signed)
  Echocardiogram 2D Echocardiogram has been performed.  Gerda Diss 09/12/2020, 1:34 PM

## 2020-09-13 ENCOUNTER — Inpatient Hospital Stay (HOSPITAL_COMMUNITY): Payer: Medicare Other

## 2020-09-13 LAB — IRON AND TIBC
Iron: 75 ug/dL (ref 45–182)
Saturation Ratios: 23 % (ref 17.9–39.5)
TIBC: 329 ug/dL (ref 250–450)
UIBC: 254 ug/dL

## 2020-09-13 LAB — BASIC METABOLIC PANEL
Anion gap: 7 (ref 5–15)
BUN: 21 mg/dL (ref 8–23)
CO2: 24 mmol/L (ref 22–32)
Calcium: 8.8 mg/dL — ABNORMAL LOW (ref 8.9–10.3)
Chloride: 107 mmol/L (ref 98–111)
Creatinine, Ser: 1.05 mg/dL (ref 0.61–1.24)
GFR, Estimated: >60 mL/min (ref >60)
Glucose, Bld: 96 mg/dL (ref 70–99)
Potassium: 4.1 mmol/L (ref 3.5–5.1)
Sodium: 138 mmol/L (ref 135–145)

## 2020-09-13 LAB — FERRITIN: Ferritin: 45 ng/mL (ref 24–336)

## 2020-09-13 LAB — CBC
HCT: 33.7 % — ABNORMAL LOW (ref 39.0–52.0)
Hemoglobin: 11.6 g/dL — ABNORMAL LOW (ref 13.0–17.0)
MCH: 33 pg (ref 26.0–34.0)
MCHC: 34.4 g/dL (ref 30.0–36.0)
MCV: 95.7 fL (ref 80.0–100.0)
Platelets: 148 10*3/uL — ABNORMAL LOW (ref 150–400)
RBC: 3.52 MIL/uL — ABNORMAL LOW (ref 4.22–5.81)
RDW: 13.2 % (ref 11.5–15.5)
WBC: 4.2 10*3/uL (ref 4.0–10.5)
nRBC: 0 % (ref 0.0–0.2)

## 2020-09-13 LAB — FOLATE: Folate: 28.1 ng/mL (ref >5.9)

## 2020-09-13 LAB — MAGNESIUM: Magnesium: 2 mg/dL (ref 1.7–2.4)

## 2020-09-13 LAB — TSH: TSH: 1.066 u[IU]/mL (ref 0.350–4.500)

## 2020-09-13 LAB — VITAMIN B12: Vitamin B-12: 240 pg/mL (ref 180–914)

## 2020-09-13 MED ORDER — PRAVASTATIN SODIUM 40 MG PO TABS: 40.0000 mg | ORAL_TABLET | Freq: Every day | ORAL | Status: DC

## 2020-09-13 MED ORDER — CALCIUM GLUCONATE-NACL 2-0.675 GM/100ML-% IV SOLN
2.0000 g | Freq: Once | INTRAVENOUS | Status: AC
  Administered 2020-09-13: 2000 mg via INTRAVENOUS
  Filled 2020-09-13 (×2): qty 100

## 2020-09-13 NOTE — Progress Notes (Signed)
PROGRESS NOTE    [name]  PPI:[id number] DOB: 06-Mar-1941 DOA: 09/11/2020 PCP: Patient, No Pcp Per   Brief Narrative:  79 year old with history of arrhythmia status post pacemaker, HTN, GERD admitted for slurred speech.  He follows with a cardiologist in [place].  Most of his work-up was unremarkable, LDL 58, A1c 5.4, UA, UDS both negative.  CTA head and neck negative for any high-grade stenosis or large vessel occlusion but showed left ICA plaque and thyroid nodule.Pacer was interrogated. Echo showed ef 60%.    Assessment & Plan:   Principal Problem:   Aphasia Active Problems:   Presence of permanent cardiac pacemaker   Essential hypertension   Anemia   TIA (transient ischemic attack)   Slurred speech probably due to acute TIA/CVA -Concerning for TIA/CVA.  CTA head and neck negative for any large vessel occlusion or high-grade stenosis but showed left ICA plaque -LDL 58, A1c 5.4, UA and UDS-negative -Unable to get MRI as his pacemaker is not safe for MRI -Has [employer] device interrogated- General Cardiology to discuss with EP regarding the results.  -Neurology team following. ASA and plavix for now.  -PT/OT - no follow up.  -Echocardiogram = ef 60% with mild LVH -Antiplatelet therapy per neurology.  Due to ICA plaque, he should be on statin  Essential hypertension  -Pending medication review by pharmacy -Permissive hypertension for now  GERD  -Pending preadmission med review by pharmacy  Arrhythmia?  With pacemaker in place -Noncompatible with MRI   DVT prophylaxis: enoxaparin (LOVENOX) injection 40 mg Start: 09/12/20 0600  Code Status: Full Family Communication: Wife at bedside    Dispo: The patient is from: Home              Anticipated d/c is to: Home              Anticipated d/c date is: 1 day              Patient currently is not medically stable to d/c.  On going work up for A fib investigation. Once cleared by Neurology will discharge the patient.  Either later today or tomorrow.   Body mass index is 22.96 kg/m.         Subjective: Patient feels well, nocomplaints. Wants to try and go home later today.   Examination: Constitutional: Not in acute distress Respiratory: Clear to auscultation bilaterally Cardiovascular: Normal sinus rhythm, no rubs Abdomen: Nontender nondistended good bowel sounds Musculoskeletal: No edema noted Skin: No rashes seen Neurologic: CN 2-12 grossly intact.  And nonfocal Psychiatric: Normal judgment and insight. Alert and oriented x 3. Normal mood.     Objective: Vitals:   09/12/20 1542 09/12/20 1543 09/12/20 1749 09/13/20 0537  BP:  (!) 146/73 136/65 (!) 144/64  Pulse:  61 (!) 59 (!) 59  Resp: 16 16 16    Temp:  98.1 F (36.7 C) 98.1 F (36.7 C) (!) 97.4 F (36.3 C)  TempSrc: Oral Oral Oral Oral  SpO2:  99% 99% 96%  Weight:      Height:        Intake/Output Summary (Last 24 hours) at 09/13/2020 1015 Last data filed at 09/12/2020 1200 Gross per 24 hour  Intake --  Output 500 ml  Net -500 ml   Filed Weights   09/11/20 2153  Weight: 72.6 kg     Data Reviewed:   CBC: Recent Labs  Lab 09/11/20 2237 09/11/20 2243 09/13/20 0629  WBC 4.1  --  4.2  NEUTROABS 2.0  --   --  HGB 10.9* 9.9* 11.6*  HCT 31.5* 29.0* 33.7*  MCV 98.4  --  95.7  PLT 156  --  148*   Basic Metabolic Panel: Recent Labs  Lab 09/11/20 2237 09/11/20 2243 09/13/20 0629  NA 135 139 138  K 4.0 4.0 4.1  CL 105 106 107  CO2 23  --  24  GLUCOSE 114* 112* 96  BUN 30* 29* 21  CREATININE 1.21 1.20 1.05  CALCIUM 8.5*  --  8.8*  MG  --   --  2.0   GFR: Estimated Creatinine Clearance: 57.6 mL/min (by C-G formula based on SCr of 1.05 mg/dL). Liver Function Tests: Recent Labs  Lab 09/11/20 2237  AST 24  ALT 23  ALKPHOS 86  BILITOT 0.9  PROT 5.7*  ALBUMIN 3.4*   No results for input(s): LIPASE, AMYLASE in the last 168 hours. No results for input(s): AMMONIA in the last 168 hours. Coagulation  Profile: Recent Labs  Lab 09/11/20 2237  INR 1.1   Cardiac Enzymes: No results for input(s): CKTOTAL, CKMB, CKMBINDEX, TROPONINI in the last 168 hours. BNP (last 3 results) No results for input(s): PROBNP in the last 8760 hours. HbA1C: Recent Labs    09/12/20 0500  HGBA1C 5.4   CBG: Recent Labs  Lab 09/11/20 2150  GLUCAP 106*   Lipid Profile: Recent Labs    09/12/20 0500  CHOL 110  HDL 36*  LDLCALC 58  TRIG 80  CHOLHDL 3.1   Thyroid Function Tests: Recent Labs    09/13/20 0626  TSH 1.066   Anemia Panel: Recent Labs    09/12/20 0425 09/13/20 0626  VITAMINB12  --  240  FOLATE  --  28.1  FERRITIN  --  45  TIBC  --  329  IRON  --  75  RETICCTPCT 0.9  --    Sepsis Labs: No results for input(s): PROCALCITON, LATICACIDVEN in the last 168 hours.  Recent Results (from the past 240 hour(s))  Respiratory Panel by RT PCR (Flu A&B, Covid) - Nasopharyngeal Swab     Status: None   Collection Time: 09/12/20 11:28 AM   Specimen: Nasopharyngeal Swab  Result Value Ref Range Status   SARS Coronavirus 2 by RT PCR NEGATIVE NEGATIVE Final    Comment: (NOTE) SARS-CoV-2 target nucleic acids are NOT DETECTED.  The SARS-CoV-2 RNA is generally detectable in upper respiratoy specimens during the acute phase of infection. The lowest concentration of SARS-CoV-2 viral copies this assay can detect is 131 copies/mL. A negative result does not preclude SARS-Cov-2 infection and should not be used as the sole basis for treatment or other patient management decisions. A negative result may occur with  improper specimen collection/handling, submission of specimen other than nasopharyngeal swab, presence of viral mutation(s) within the areas targeted by this assay, and inadequate number of viral copies (<131 copies/mL). A negative result must be combined with clinical observations, patient history, and epidemiological information. The expected result is Negative.  Fact Sheet for  Patients:  https://www.moore.com/  Fact Sheet for Healthcare Providers:  https://www.young.biz/  This test is no t yet approved or cleared by the [place] FDA and  has been authorized for detection and/or diagnosis of SARS-CoV-2 by FDA under an Emergency Use Authorization (EUA). This EUA will remain  in effect (meaning this test can be used) for the duration of the COVID-19 declaration under Section 564(b)(1) of the Act, 21 U.S.C. section 360bbb-3(b)(1), unless the authorization is terminated or revoked sooner.     Influenza A  by PCR NEGATIVE NEGATIVE Final   Influenza B by PCR NEGATIVE NEGATIVE Final    Comment: (NOTE) The Xpert Xpress SARS-CoV-2/FLU/RSV assay is intended as an aid in  the diagnosis of influenza from Nasopharyngeal swab specimens and  should not be used as a sole basis for treatment. Nasal washings and  aspirates are unacceptable for Xpert Xpress SARS-CoV-2/FLU/RSV  testing.  Fact Sheet for Patients: https://www.moore.com/  Fact Sheet for Healthcare Providers: https://www.young.biz/  This test is not yet approved or cleared by the [place] FDA and  has been authorized for detection and/or diagnosis of SARS-CoV-2 by  FDA under an Emergency Use Authorization (EUA). This EUA will remain  in effect (meaning this test can be used) for the duration of the  Covid-19 declaration under Section 564(b)(1) of the Act, 21  U.S.C. section 360bbb-3(b)(1), unless the authorization is  terminated or revoked. Performed at [facility], [address], [place], [place] [address]          Radiology Studies: CT Angio Head W or Wo Contrast  Result Date: 09/12/2020 CLINICAL DATA:  Stroke/TIA EXAM: CT ANGIOGRAPHY HEAD AND NECK TECHNIQUE: Multidetector CT imaging of the head and neck was performed using the standard protocol during bolus administration of intravenous contrast.  Multiplanar CT image reconstructions and MIPs were obtained to evaluate the vascular anatomy. Carotid stenosis measurements (when applicable) are obtained utilizing NASCET criteria, using the distal internal carotid diameter as the denominator. CONTRAST:  OMNIPAQUE IOHEXOL 350 MG/ML SOLN COMPARISON:  None. FINDINGS: CT HEAD FINDINGS Brain: There is no mass, hemorrhage or extra-axial collection. There is generalized atrophy without lobar predilection. There is hypoattenuation of the periventricular white matter, most commonly indicating chronic ischemic microangiopathy. Skull: The visualized skull base, calvarium and extracranial soft tissues are normal. Sinuses/Orbits: No fluid levels or advanced mucosal thickening of the visualized paranasal sinuses. No mastoid or middle ear effusion. The orbits are normal. CTA NECK FINDINGS SKELETON: There is no bony spinal canal stenosis. No lytic or blastic lesion. OTHER NECK: Enlarged heterogeneous right thyroid lobe. UPPER CHEST: No pneumothorax or pleural effusion. No nodules or masses. AORTIC ARCH: There is calcific atherosclerosis of the aortic arch. There is no aneurysm, dissection or hemodynamically significant stenosis of the visualized portion of the aorta. Conventional 3 vessel aortic branching pattern. The visualized proximal subclavian arteries are widely patent. RIGHT CAROTID SYSTEM: No dissection, occlusion or aneurysm. There is calcified atherosclerosis extending into the proximal ICA, resulting in less than 50% stenosis. LEFT CAROTID SYSTEM: There is eccentric plaque at the left carotid bifurcation extending into the proximal left internal carotid artery. There is no hemodynamically significant stenosis. VERTEBRAL ARTERIES: Left dominant configuration. Both origins are clearly patent. There is no dissection, occlusion or flow-limiting stenosis to the skull base (V1-V3 segments). CTA HEAD FINDINGS POSTERIOR CIRCULATION: --Vertebral arteries: Normal V4  segments. --Inferior cerebellar arteries: Normal. --Basilar artery: Normal. --Superior cerebellar arteries: Normal. --Posterior cerebral arteries (PCA): Normal. ANTERIOR CIRCULATION: --Intracranial internal carotid arteries: Atherosclerotic calcification of the internal carotid arteries at the skull base without hemodynamically significant stenosis. --Anterior cerebral arteries (ACA): Normal. Both A1 segments are present. Patent anterior communicating artery (a-comm). --Middle cerebral arteries (MCA): Normal. VENOUS SINUSES: As permitted by contrast timing, patent. ANATOMIC VARIANTS: None Review of the MIP images confirms the above findings. IMPRESSION: 1. No emergent large vessel occlusion or high-grade stenosis of the intracranial arteries. 2. Eccentric plaque within the proximal left internal carotid artery may be at increased risk of embolus formation. 3. Bilateral carotid  bifurcation atherosclerosis without hemodynamically significant stenosis by NASCET criteria. 4. Enlarged heterogeneous right thyroid lobe. Recommend thyroid ultrasound on a nonemergent basis (ref: J Am Coll Radiol. 2015 Feb;12(2): 143-50). Aortic Atherosclerosis (ICD10-I70.0). Electronically Signed   By: Deatra RobinsonKevin  Herman M.D.   On: 09/12/2020 03:15   CT HEAD WO CONTRAST  Result Date: 09/13/2020 CLINICAL DATA:  Follow-up TIA EXAM: CT HEAD WITHOUT CONTRAST TECHNIQUE: Contiguous axial images were obtained from the base of the skull through the vertex without intravenous contrast. COMPARISON:  CT a of the head neck from yesterday FINDINGS: Brain: No evidence of acute infarction, hemorrhage, hydrocephalus, extra-axial collection or mass lesion/mass effect. Generalized atrophy and confluent chronic small vessel ischemic low-density in the white matter, especially about the frontal horns. Vascular: No hyperdense vessel or unexpected calcification. Skull: Normal. Negative for fracture or focal lesion. Sinuses/Orbits: Negative IMPRESSION: No  hemorrhage or visible infarct. Aging brain. Electronically Signed   By: Marnee SpringJonathon  Watts M.D.   On: 09/13/2020 05:42   CT Angio Neck W and/or Wo Contrast  Result Date: 09/12/2020 CLINICAL DATA:  Stroke/TIA EXAM: CT ANGIOGRAPHY HEAD AND NECK TECHNIQUE: Multidetector CT imaging of the head and neck was performed using the standard protocol during bolus administration of intravenous contrast. Multiplanar CT image reconstructions and MIPs were obtained to evaluate the vascular anatomy. Carotid stenosis measurements (when applicable) are obtained utilizing NASCET criteria, using the distal internal carotid diameter as the denominator. CONTRAST:  100mL OMNIPAQUE IOHEXOL 350 MG/ML SOLN COMPARISON:  None. FINDINGS: CT HEAD FINDINGS Brain: There is no mass, hemorrhage or extra-axial collection. There is generalized atrophy without lobar predilection. There is hypoattenuation of the periventricular white matter, most commonly indicating chronic ischemic microangiopathy. Skull: The visualized skull base, calvarium and extracranial soft tissues are normal. Sinuses/Orbits: No fluid levels or advanced mucosal thickening of the visualized paranasal sinuses. No mastoid or middle ear effusion. The orbits are normal. CTA NECK FINDINGS SKELETON: There is no bony spinal canal stenosis. No lytic or blastic lesion. OTHER NECK: Enlarged heterogeneous right thyroid lobe. UPPER CHEST: No pneumothorax or pleural effusion. No nodules or masses. AORTIC ARCH: There is calcific atherosclerosis of the aortic arch. There is no aneurysm, dissection or hemodynamically significant stenosis of the visualized portion of the aorta. Conventional 3 vessel aortic branching pattern. The visualized proximal subclavian arteries are widely patent. RIGHT CAROTID SYSTEM: No dissection, occlusion or aneurysm. There is calcified atherosclerosis extending into the proximal ICA, resulting in less than 50% stenosis. LEFT CAROTID SYSTEM: There is eccentric plaque  at the left carotid bifurcation extending into the proximal left internal carotid artery. There is no hemodynamically significant stenosis. VERTEBRAL ARTERIES: Left dominant configuration. Both origins are clearly patent. There is no dissection, occlusion or flow-limiting stenosis to the skull base (V1-V3 segments). CTA HEAD FINDINGS POSTERIOR CIRCULATION: --Vertebral arteries: Normal V4 segments. --Inferior cerebellar arteries: Normal. --Basilar artery: Normal. --Superior cerebellar arteries: Normal. --Posterior cerebral arteries (PCA): Normal. ANTERIOR CIRCULATION: --Intracranial internal carotid arteries: Atherosclerotic calcification of the internal carotid arteries at the skull base without hemodynamically significant stenosis. --Anterior cerebral arteries (ACA): Normal. Both A1 segments are present. Patent anterior communicating artery (a-comm). --Middle cerebral arteries (MCA): Normal. VENOUS SINUSES: As permitted by contrast timing, patent. ANATOMIC VARIANTS: None Review of the MIP images confirms the above findings. IMPRESSION: 1. No emergent large vessel occlusion or high-grade stenosis of the intracranial arteries. 2. Eccentric plaque within the proximal left internal carotid artery may be at increased risk of embolus formation. 3. Bilateral carotid bifurcation atherosclerosis without hemodynamically significant stenosis  by NASCET criteria. 4. Enlarged heterogeneous right thyroid lobe. Recommend thyroid ultrasound on a nonemergent basis (ref: J Am Coll Radiol. 2015 Feb;12(2): 143-50). Aortic Atherosclerosis (ICD10-I70.0). Electronically Signed   By: Deatra Robinson M.D.   On: 09/12/2020 03:15   ECHOCARDIOGRAM COMPLETE  Result Date: 09/12/2020    ECHOCARDIOGRAM REPORT   Patient Name:   [name] Date of Exam: 09/12/2020 Medical Rec #:  [id number]   Height:       70.0 in Accession #:    [id number]  Weight:       160.0 lb Date of Birth:  Nov 06, 1941   BSA:          1.898 m Patient Age:    79 years    BP:            125/72 mmHg Patient Gender: M           HR:           60 bpm. Exam Location:  Inpatient Procedure: 2D Echo, Cardiac Doppler and Color Doppler Indications:    Stroke  History:        Patient has no prior history of Echocardiogram examinations.                 Pacemaker; Risk Factors:Hypertension. GERD.  Sonographer:    Ross Ludwig RDCS (AE) Referring Phys: [id number] VASUNDHRA RATHORE IMPRESSIONS  1. Left ventricular ejection fraction, by estimation, is 55 to 60%. The left ventricle has normal function. The left ventricle has no regional wall motion abnormalities. There is mild concentric left ventricular hypertrophy. Left ventricular diastolic parameters are indeterminate.  2. Right ventricular systolic function is normal. The right ventricular size is normal. There is normal pulmonary artery systolic pressure. The estimated right ventricular systolic pressure is 21.8 mmHg.  3. The mitral valve is normal in structure. Trivial mitral valve regurgitation. No evidence of mitral stenosis.  4. The aortic valve is tricuspid. Aortic valve regurgitation is mild. Mild aortic valve sclerosis is present, with no evidence of aortic valve stenosis.  5. Aortic dilatation noted. There is mild dilatation of the aortic root, measuring 39 mm.  6. The inferior vena cava is normal in size with greater than 50% respiratory variability, suggesting right atrial pressure of 3 mmHg. FINDINGS  Left Ventricle: Left ventricular ejection fraction, by estimation, is 55 to 60%. The left ventricle has normal function. The left ventricle has no regional wall motion abnormalities. The left ventricular internal cavity size was normal in size. There is  mild concentric left ventricular hypertrophy. Left ventricular diastolic parameters are indeterminate. Normal left ventricular filling pressure. Right Ventricle: The right ventricular size is normal. No increase in right ventricular wall thickness. Right ventricular systolic function is normal.  There is normal pulmonary artery systolic pressure. The tricuspid regurgitant velocity is 2.17 m/s, and  with an assumed right atrial pressure of 3 mmHg, the estimated right ventricular systolic pressure is 21.8 mmHg. Left Atrium: Left atrial size was normal in size. Right Atrium: Right atrial size was normal in size. Pericardium: There is no evidence of pericardial effusion. Mitral Valve: The mitral valve is normal in structure. Trivial mitral valve regurgitation. No evidence of mitral valve stenosis. Tricuspid Valve: The tricuspid valve is normal in structure. Tricuspid valve regurgitation is trivial. No evidence of tricuspid stenosis. Aortic Valve: The aortic valve is tricuspid. Aortic valve regurgitation is mild. Aortic regurgitation PHT measures 458 msec. Mild aortic valve sclerosis is present, with no evidence of aortic valve stenosis. Aortic valve mean  gradient measures 4.0 mmHg. Aortic valve peak gradient measures 6.2 mmHg. Aortic valve area, by VTI measures 2.27 cm. Pulmonic Valve: The pulmonic valve was normal in structure. Pulmonic valve regurgitation is trivial. No evidence of pulmonic stenosis. Aorta: Aortic dilatation noted. There is mild dilatation of the aortic root, measuring 39 mm. Venous: The inferior vena cava is normal in size with greater than 50% respiratory variability, suggesting right atrial pressure of 3 mmHg. IAS/Shunts: No atrial level shunt detected by color flow Doppler.  LEFT VENTRICLE PLAX 2D LVIDd:         5.20 cm  Diastology LVIDs:         3.80 cm  LV e' medial:    5.77 cm/s LV PW:         1.20 cm  LV E/e' medial:  10.2 LV IVS:        1.30 cm  LV e' lateral:   10.70 cm/s LVOT diam:     2.10 cm  LV E/e' lateral: 5.5 LV SV:         65 LV SV Index:   34 LVOT Area:     3.46 cm  RIGHT VENTRICLE             IVC RV S prime:     15.20 cm/s  IVC diam: 1.30 cm TAPSE (M-mode): 2.4 cm LEFT ATRIUM           Index       RIGHT ATRIUM           Index LA diam:      4.10 cm 2.16 cm/m  RA Area:      21.50 cm LA Vol (A2C): 56.5 ml 29.76 ml/m RA Volume:   57.30 ml  30.18 ml/m LA Vol (A4C): 52.8 ml 27.81 ml/m  AORTIC VALVE AV Area (Vmax):    2.52 cm AV Area (Vmean):   2.08 cm AV Area (VTI):     2.27 cm AV Vmax:           124.00 cm/s AV Vmean:          93.100 cm/s AV VTI:            0.287 m AV Peak Grad:      6.2 mmHg AV Mean Grad:      4.0 mmHg LVOT Vmax:         90.30 cm/s LVOT Vmean:        56.000 cm/s LVOT VTI:          0.188 m LVOT/AV VTI ratio: 0.66 AI PHT:            458 msec  AORTA Ao Root diam: 3.90 cm Ao Asc diam:  3.60 cm MITRAL VALVE               TRICUSPID VALVE MV Area (PHT): 5.97 cm    TR Peak grad:   18.8 mmHg MV Decel Time: 127 msec    TR Vmax:        217.00 cm/s MV E velocity: 58.60 cm/s MV A velocity: 53.50 cm/s  SHUNTS MV E/A ratio:  1.10        Systemic VTI:  0.19 m                            Systemic Diam: 2.10 cm Armanda Magic MD Electronically signed by Armanda Magic MD Signature Date/Time: 09/12/2020/2:17:08 PM    Final  Scheduled Meds: . aspirin EC  81 mg Oral Daily  . atorvastatin  40 mg Oral Daily  . clopidogrel  75 mg Oral Daily  . enoxaparin (LOVENOX) injection  40 mg Subcutaneous Q24H   Continuous Infusions: . calcium gluconate 2,000 mg (09/13/20 0931)     LOS: 1 day   Time spent= 35 mins    Tate Jerkins Joline Maxcy, MD [facility]  If 7PM-7AM, please contact night-coverage  09/13/2020, 10:15 AM

## 2020-09-13 NOTE — Progress Notes (Signed)
Physical Therapy Treatment Patient Details Name: [name] MRN: [id number] DOB: [birth year] Today's Date: 09/13/2020    History of Present Illness Pt presented with slurred speech and word finding difficulty. CT negative. Unable to undergo MRI due to pacer. PMH - pacer, HTN, scoliosis    PT Comments    Pt progressing towards physical therapy goals. Was able to perform transfers with modified independence and ambulation with supervision for safety. Pt antalgic and with L lateral lean 2 scoliosis, however likely at/near baseline of function. Pt enthusiastic about exercising, stating he and his wife walk for exercise at home, however distance has been limited by SOB lately. Pt with questions regarding exercise at home, walking with hand weights, group classes, etc. Therapist advised not to walk with hand weights due to current DOE (VSS with O2 96% and HR 58 bpm), however feel a group exercise class vs personal training with staff at [facility]) would be appropriate and beneficial. Will continue to follow and update therapy recommendations to align with medical POC if needed after Cardiology consult is in.   Follow Up Recommendations  No PT follow up     Equipment Recommendations  None recommended by PT    Recommendations for Other Services       Precautions / Restrictions Precautions Precautions: Fall Restrictions Weight Bearing Restrictions: No    Mobility  Bed Mobility Overal bed mobility: Modified Independent             General bed mobility comments: No assist required to transition to EOB.  Transfers Overall transfer level: Modified independent Equipment used: None Transfers: Sit to/from Stand Sit to Stand: Supervision         General transfer comment: For safety and lines  Ambulation/Gait Ambulation/Gait assistance: Supervision Gait Distance (Feet): 600 Feet Assistive device: None Gait Pattern/deviations: Step-through  pattern;Decreased stride length;Antalgic Gait velocity: Decreased Gait velocity interpretation: >2.62 ft/sec, indicative of community ambulatory General Gait Details: Antalgic with R lateral lean at baseline due to scoliosis. Pt appears unsteady at times however pt ambulating quickly and confidently, stating he is at baseline.    Stairs             Wheelchair Mobility    Modified Rankin (Stroke Patients Only) Modified Rankin (Stroke Patients Only) Pre-Morbid Rankin Score: No significant disability Modified Rankin: Slight disability     Balance Overall balance assessment: Needs assistance Sitting-balance support: No upper extremity supported;Feet supported Sitting balance-Leahy Scale: Good     Standing balance support: No upper extremity supported;During functional activity Standing balance-Leahy Scale: Fair                              Cognition Arousal/Alertness: Awake/alert Behavior During Therapy: WFL for tasks assessed/performed Overall Cognitive Status: Within Functional Limits for tasks assessed                                        Exercises      General Comments        Pertinent Vitals/Pain Pain Assessment: No/denies pain    Home Living                      Prior Function            PT Goals (current goals can now be found in the care plan section) Acute Rehab  PT Goals Patient Stated Goal: return to new home PT Goal Formulation: With patient Time For Goal Achievement: 09/19/20 Potential to Achieve Goals: Good Progress towards PT goals: Progressing toward goals    Frequency    Min 3X/week      PT Plan Current plan remains appropriate    Co-evaluation              AM-PAC PT "6 Clicks" Mobility   Outcome Measure  Help needed turning from your back to your side while in a flat bed without using bedrails?: None Help needed moving from lying on your back to sitting on the side of a flat bed  without using bedrails?: None Help needed moving to and from a bed to a chair (including a wheelchair)?: None Help needed standing up from a chair using your arms (e.g., wheelchair or bedside chair)?: None Help needed to walk in hospital room?: None Help needed climbing 3-5 steps with a railing? : A Little 6 Click Score: 23    End of Session Equipment Utilized During Treatment: Gait belt Activity Tolerance: Patient tolerated treatment well Patient left: in chair;with call bell/phone within reach Nurse Communication: Mobility status PT Visit Diagnosis: Unsteadiness on feet (R26.81)     Time: 5397-6734 PT Time Calculation (min) (ACUTE ONLY): 33 min  Charges:  $Gait Training: 23-37 mins                     Conni Slipper, PT, DPT Acute Rehabilitation Services Pager: [phone] Office: [phone]    Marylynn Pearson 09/13/2020, 3:08 PM

## 2020-09-13 NOTE — Progress Notes (Signed)
STROKE TEAM PROGRESS NOTE   INTERVAL HISTORY Wife at the bedside. Pt walked with PT in the hallway, doing well. Pacemaker interrogation showed SVT and possible afib, pending EP to evaluate. Overnight tele also concerned unsustained Vtach but asymptomatic.  OBJECTIVE Vitals:   09/12/20 1542 09/12/20 1543 09/12/20 1749 09/13/20 0537  BP:  (!) 146/73 136/65 (!) 144/64  Pulse:  61 (!) 59 (!) 59  Resp: 16 16 16    Temp:  98.1 F (36.7 C) 98.1 F (36.7 C) (!) 97.4 F (36.3 C)  TempSrc: Oral Oral Oral Oral  SpO2:  99% 99% 96%  Weight:      Height:       CBC:  Recent Labs  Lab 09/11/20 2237 09/11/20 2237 09/11/20 2243 09/13/20 0629  WBC 4.1  --   --  4.2  NEUTROABS 2.0  --   --   --   HGB 10.9*   < > 9.9* 11.6*  HCT 31.5*   < > 29.0* 33.7*  MCV 98.4  --   --  95.7  PLT 156  --   --  148*   < > = values in this interval not displayed.   Basic Metabolic Panel:  Recent Labs  Lab 09/11/20 2237 09/11/20 2237 09/11/20 2243 09/13/20 0629  NA 135   < > 139 138  K 4.0   < > 4.0 4.1  CL 105   < > 106 107  CO2 23  --   --  24  GLUCOSE 114*   < > 112* 96  BUN 30*   < > 29* 21  CREATININE 1.21   < > 1.20 1.05  CALCIUM 8.5*  --   --  8.8*  MG  --   --   --  2.0   < > = values in this interval not displayed.   Lipid Panel:     Component Value Date/Time   CHOL 110 09/12/2020 0500   TRIG 80 09/12/2020 0500   HDL 36 (L) 09/12/2020 0500   CHOLHDL 3.1 09/12/2020 0500   VLDL 16 09/12/2020 0500   LDLCALC 58 09/12/2020 0500   HgbA1c:  Lab Results  Component Value Date   HGBA1C 5.4 09/12/2020   Urine Drug Screen:     Component Value Date/Time   LABOPIA NONE DETECTED 09/12/2020 0051   COCAINSCRNUR NONE DETECTED 09/12/2020 0051   LABBENZ NONE DETECTED 09/12/2020 0051   AMPHETMU NONE DETECTED 09/12/2020 0051   THCU NONE DETECTED 09/12/2020 0051   LABBARB NONE DETECTED 09/12/2020 0051    Alcohol Level     Component Value Date/Time   ETH <10 09/12/2020 0110     IMAGING  CT Angio Head W or Wo Contrast CT Angio Neck W and/or Wo Contrast 09/12/2020 1. No emergent large vessel occlusion or high-grade stenosis of the intracranial arteries.  2. Eccentric plaque within the proximal left internal carotid artery may be at increased risk of embolus formation.  3. Bilateral carotid bifurcation atherosclerosis without hemodynamically significant stenosis by NASCET criteria.  4. Enlarged heterogeneous right thyroid lobe. Recommend thyroid ultrasound on a nonemergent basis (ref: J Am Coll Radiol. 2015 Feb;12(2): 143-50).  Aortic Atherosclerosis (ICD10-I70.0).   CT HEAD WO CONTRAST 09/13/2020 No hemorrhage or visible infarct. Aging brain.   ECHOCARDIOGRAM COMPLETE 09/12/2020 1. Left ventricular ejection fraction, by estimation, is 55 to 60%. The left ventricle has normal function. The left ventricle has no regional wall motion abnormalities. There is mild concentric left ventricular hypertrophy. Left ventricular diastolic parameters  are indeterminate.   2. Right ventricular systolic function is normal. The right ventricular size is normal. There is normal pulmonary artery systolic pressure. The estimated right ventricular systolic pressure is 21.8 mmHg.   3. The mitral valve is normal in structure. Trivial mitral valve regurgitation. No evidence of mitral stenosis.   4. The aortic valve is tricuspid. Aortic valve regurgitation is mild. Mild aortic valve sclerosis is present, with no evidence of aortic valve stenosis.   5. Aortic dilatation noted. There is mild dilatation of the aortic root, measuring 39 mm.   6. The inferior vena cava is normal in size with greater than 50% respiratory variability, suggesting right atrial pressure of 3 mmHg.   ECG - paced rate 62 BPM. (See cardiology reading for complete details)   PHYSICAL EXAM   Temp:  [97.4 F (36.3 C)-98.1 F (36.7 C)] 97.4 F (36.3 C) (11/01 0537) Pulse Rate:  [59-63] 59 (11/01 0537) Resp:   [13-16] 16 (10/31 1749) BP: (118-146)/(58-73) 144/64 (11/01 0537) SpO2:  [96 %-99 %] 96 % (11/01 0537)  General - Well nourished, well developed, in no apparent distress.  Ophthalmologic - fundi not visualized due to noncooperation.  Cardiovascular - Regular rhythm and rate.  Mental Status -  Level of arousal and orientation to time, place, and person were intact. Language including expression, naming, repetition, comprehension was assessed and found intact. Fund of Knowledge was assessed and was intact.  Cranial Nerves II - XII - II - Visual field intact OU. III, IV, VI - Extraocular movements intact. V - Facial sensation intact bilaterally. VII - Facial movement intact bilaterally. VIII - Hearing & vestibular intact bilaterally. X - Palate elevates symmetrically. XI - Chin turning & shoulder shrug intact bilaterally. XII - Tongue protrusion intact.  Motor Strength - The patient's strength was normal in all extremities and pronator drift was absent.  Bulk was normal and fasciculations were absent.   Motor Tone - Muscle tone was assessed at the neck and appendages and was normal.  Reflexes - The patient's reflexes were symmetrical in all extremities and he had no pathological reflexes.  Sensory - Light touch, temperature/pinprick were assessed and were symmetrical.    Coordination - The patient had normal movements in the hands with no ataxia or dysmetria.  Tremor was absent.  Gait and Station - walking with PT in hallway, no fall tendency, chronic limp on the left side.    ASSESSMENT/PLAN [name] is a 79 y.o. male who recently moved to [facility] from [place] with a PMHx of Htn, GERD, ? atrial fibrillation, bradycardia s/p pacemaker in 2016. The pt was having dinner and suddenly developed word finding difficulty. Prior to symptoms patient started to feel off with associated headache ~2000 and his wife noticed that he unable to find his words and at times speech did  not make sense. He did not receive IV t-PA due to improvement in deficits.  TIA secondary to ?? AF not on AC vs. Soft plaque at left ICA bulb  CT head - no acute abnormality  MRI head - not able to do due to pacer not compatible with MRI  CTA H&N - No emergent large vessel occlusion or high-grade stenosis of the intracranial arteries. Eccentric plaque within the proximal left internal carotid artery may be at increased risk of embolus formation. Bilateral carotid bifurcation atherosclerosis without hemodynamically significant stenosis by NASCET criteria.   CT repeat Unremarkable   2D Echo - EF 55-60%. No source of embolus  Pacemaker interrogation +SVT, ? AF per Dr. Mayford Knife. EP eval pending    [employer] Virus 2 - neg  LDL - 58  HgbA1c - 5.4  UDS - negative  VTE prophylaxis - Lovenox  aspirin 81 mg daily prior to admission, now on aspirin 81 mg daily and clopidogrel 75 mg daily DAPT   Therapy recommendations:  None  Disposition:  Pending  ?? PAF Vtach  SVT  A. fib listed in his medical problem list  Patient seems not aware of it  Pacemaker interrogation +SVT, ? AF per Dr. Mayford Knife. EP eval pending    Asymptomatic Vtach 12-13 beats 10/31  If PAF confirmed, will need to consider DOAC  Pt moved from [place] to [place] days ago, needs cardiology EP follow up as outpt ASAP.   Hypertension  Home BP meds: unclear  Current BP meds: none   Stable . Long-term BP goal normotensive  Hyperlipidemia  Home Lipid lowering medication: pravastatin 40  LDL 58, goal < 70  Resumed pravastatin 40 mg daily   Continue statin at discharge  Other Stroke Risk Factors  Advanced age  ETOH use, advised to drink no more than 2 alcoholic beverage per day.  Family hx strokes    Other Active Problems  Code status - Full code   Anemia - Hgb - 10.9->9.9->11.6  Aortic Atherosclerosis (ICD10-I70.0)  Bradycardia s/p pacer  GERD  Hospital day # 1  Marvel Plan, MD  PhD Stroke Neurology 09/13/2020 11:26 AM   To contact Stroke Continuity provider, please refer to [email or web address]. After hours, contact General Neurology

## 2020-09-14 ENCOUNTER — Inpatient Hospital Stay (HOSPITAL_COMMUNITY)
Admission: EM | Admit: 2020-09-14 | Discharge: 2020-09-17 | DRG: 038 | Disposition: A | Discharge status: Home Health | Payer: Medicare Other | Attending: Internal Medicine | Admitting: Internal Medicine

## 2020-09-14 ENCOUNTER — Other Ambulatory Visit: Payer: Self-pay

## 2020-09-14 ENCOUNTER — Emergency Department (HOSPITAL_COMMUNITY): Payer: Medicare Other

## 2020-09-14 ENCOUNTER — Ambulatory Visit: Payer: Self-pay | Admitting: *Deleted

## 2020-09-14 ENCOUNTER — Observation Stay (HOSPITAL_COMMUNITY): Payer: Medicare Other

## 2020-09-14 ENCOUNTER — Encounter (HOSPITAL_COMMUNITY): Payer: Self-pay | Admitting: Internal Medicine

## 2020-09-14 ENCOUNTER — Encounter (HOSPITAL_COMMUNITY): Payer: Self-pay

## 2020-09-14 DIAGNOSIS — R569 Unspecified convulsions: Secondary | ICD-10-CM | POA: Diagnosis present

## 2020-09-14 DIAGNOSIS — Z823 Family history of stroke: Secondary | ICD-10-CM

## 2020-09-14 DIAGNOSIS — G459 Transient cerebral ischemic attack, unspecified: Secondary | ICD-10-CM | POA: Diagnosis not present

## 2020-09-14 DIAGNOSIS — Z20822 Contact with and (suspected) exposure to covid-19: Secondary | ICD-10-CM | POA: Diagnosis present

## 2020-09-14 DIAGNOSIS — Z79899 Other long term (current) drug therapy: Secondary | ICD-10-CM

## 2020-09-14 DIAGNOSIS — R4701 Aphasia: Secondary | ICD-10-CM | POA: Diagnosis present

## 2020-09-14 DIAGNOSIS — I700 Atherosclerosis of aorta: Secondary | ICD-10-CM | POA: Diagnosis present

## 2020-09-14 DIAGNOSIS — I10 Essential (primary) hypertension: Secondary | ICD-10-CM | POA: Diagnosis present

## 2020-09-14 DIAGNOSIS — E785 Hyperlipidemia, unspecified: Secondary | ICD-10-CM | POA: Diagnosis present

## 2020-09-14 DIAGNOSIS — Z7901 Long term (current) use of anticoagulants: Secondary | ICD-10-CM

## 2020-09-14 DIAGNOSIS — I480 Paroxysmal atrial fibrillation: Secondary | ICD-10-CM | POA: Diagnosis present

## 2020-09-14 DIAGNOSIS — R001 Bradycardia, unspecified: Secondary | ICD-10-CM | POA: Diagnosis present

## 2020-09-14 DIAGNOSIS — K219 Gastro-esophageal reflux disease without esophagitis: Secondary | ICD-10-CM | POA: Diagnosis present

## 2020-09-14 DIAGNOSIS — Z950 Presence of cardiac pacemaker: Secondary | ICD-10-CM

## 2020-09-14 LAB — CBC
HCT: 34.5 % — ABNORMAL LOW (ref 39.0–52.0)
HCT: 38 % — ABNORMAL LOW (ref 39.0–52.0)
Hemoglobin: 12 g/dL — ABNORMAL LOW (ref 13.0–17.0)
Hemoglobin: 13.1 g/dL (ref 13.0–17.0)
MCH: 33.1 pg (ref 26.0–34.0)
MCH: 33.9 pg (ref 26.0–34.0)
MCHC: 34.8 g/dL (ref 30.0–36.0)
MCHC: 34.5 g/dL (ref 30.0–36.0)
MCV: 95.3 fL (ref 80.0–100.0)
MCV: 98.4 fL (ref 80.0–100.0)
Platelets: 161 10*3/uL (ref 150–400)
Platelets: 167 10*3/uL (ref 150–400)
RBC: 3.62 MIL/uL — ABNORMAL LOW (ref 4.22–5.81)
RBC: 3.86 MIL/uL — ABNORMAL LOW (ref 4.22–5.81)
RDW: 13 % (ref 11.5–15.5)
RDW: 13.3 % (ref 11.5–15.5)
WBC: 4.1 10*3/uL (ref 4.0–10.5)
WBC: 5.3 10*3/uL (ref 4.0–10.5)
nRBC: 0 % (ref 0.0–0.2)
nRBC: 0 % (ref 0.0–0.2)

## 2020-09-14 LAB — COMPREHENSIVE METABOLIC PANEL
ALT: 27 U/L (ref 0–44)
AST: 31 U/L (ref 15–41)
Albumin: 3.7 g/dL (ref 3.5–5.0)
Alkaline Phosphatase: 91 U/L (ref 38–126)
Anion gap: 8 (ref 5–15)
BUN: 24 mg/dL — ABNORMAL HIGH (ref 8–23)
CO2: 24 mmol/L (ref 22–32)
Calcium: 9.2 mg/dL (ref 8.9–10.3)
Chloride: 105 mmol/L (ref 98–111)
Creatinine, Ser: 1.12 mg/dL (ref 0.61–1.24)
GFR, Estimated: >60 mL/min (ref >60)
Glucose, Bld: 89 mg/dL (ref 70–99)
Potassium: 4.2 mmol/L (ref 3.5–5.1)
Sodium: 137 mmol/L (ref 135–145)
Total Bilirubin: 0.9 mg/dL (ref 0.3–1.2)
Total Protein: 6.7 g/dL (ref 6.5–8.1)

## 2020-09-14 LAB — DIFFERENTIAL
Abs Immature Granulocytes: 0 10*3/uL (ref 0.00–0.07)
Basophils Absolute: 0 10*3/uL (ref 0.0–0.1)
Basophils Relative: 1 %
Eosinophils Absolute: 0 10*3/uL (ref 0.0–0.5)
Eosinophils Relative: 0 %
Immature Granulocytes: 0 %
Lymphocytes Relative: 42 %
Lymphs Abs: 2.2 10*3/uL (ref 0.7–4.0)
Monocytes Absolute: 0.5 10*3/uL (ref 0.1–1.0)
Monocytes Relative: 10 %
Neutro Abs: 2.6 10*3/uL (ref 1.7–7.7)
Neutrophils Relative %: 47 %

## 2020-09-14 LAB — I-STAT CHEM 8, ED
BUN: 25 mg/dL — ABNORMAL HIGH (ref 8–23)
Calcium, Ion: 1.13 mmol/L — ABNORMAL LOW (ref 1.15–1.40)
Chloride: 103 mmol/L (ref 98–111)
Creatinine, Ser: 1.1 mg/dL (ref 0.61–1.24)
Glucose, Bld: 81 mg/dL (ref 70–99)
HCT: 36 % — ABNORMAL LOW (ref 39.0–52.0)
Hemoglobin: 12.2 g/dL — ABNORMAL LOW (ref 13.0–17.0)
Potassium: 4.1 mmol/L (ref 3.5–5.1)
Sodium: 137 mmol/L (ref 135–145)
TCO2: 23 mmol/L (ref 22–32)

## 2020-09-14 LAB — BASIC METABOLIC PANEL
Anion gap: 8 (ref 5–15)
BUN: 21 mg/dL (ref 8–23)
CO2: 25 mmol/L (ref 22–32)
Calcium: 9.1 mg/dL (ref 8.9–10.3)
Chloride: 105 mmol/L (ref 98–111)
Creatinine, Ser: 1.02 mg/dL (ref 0.61–1.24)
GFR, Estimated: >60 mL/min (ref >60)
Glucose, Bld: 108 mg/dL — ABNORMAL HIGH (ref 70–99)
Potassium: 4.2 mmol/L (ref 3.5–5.1)
Sodium: 138 mmol/L (ref 135–145)

## 2020-09-14 LAB — APTT: aPTT: 41 seconds — ABNORMAL HIGH (ref 24–36)

## 2020-09-14 LAB — PROTIME-INR
INR: 1.2 (ref 0.8–1.2)
Prothrombin Time: 14.4 seconds (ref 11.4–15.2)

## 2020-09-14 LAB — CBG MONITORING, ED: Glucose-Capillary: 86 mg/dL (ref 70–99)

## 2020-09-14 LAB — MAGNESIUM: Magnesium: 1.9 mg/dL (ref 1.7–2.4)

## 2020-09-14 MED ORDER — APIXABAN (ELIQUIS) VTE STARTER PACK (10MG AND 5MG): ORAL_TABLET | ORAL | 0 refills | Status: DC

## 2020-09-14 MED ORDER — IOHEXOL 350 MG/ML SOLN
75.0000 mL | Freq: Once | INTRAVENOUS | Status: AC | PRN
  Administered 2020-09-14: 75 mL via INTRAVENOUS

## 2020-09-14 MED ORDER — ACETAMINOPHEN 650 MG RE SUPP: 650.0000 mg | RECTAL | Status: DC | PRN

## 2020-09-14 MED ORDER — APIXABAN 5 MG PO TABS
5.0000 mg | ORAL_TABLET | Freq: Two times a day (BID) | ORAL | 1 refills | Status: DC
Start: 2020-09-14 — End: 2020-12-27

## 2020-09-14 MED ORDER — APIXABAN 5 MG PO TABS
5.0000 mg | ORAL_TABLET | Freq: Two times a day (BID) | ORAL | Status: DC
  Administered 2020-09-14: 5 mg via ORAL
  Filled 2020-09-14: qty 1

## 2020-09-14 MED ORDER — VITAMIN D 25 MCG (1000 UNIT) PO TABS
1000.0000 [IU] | ORAL_TABLET | Freq: Every day | ORAL | Status: DC
  Administered 2020-09-15 – 2020-09-16 (×3): 1000 [IU] via ORAL
  Filled 2020-09-14 (×3): qty 1

## 2020-09-14 MED ORDER — STROKE: EARLY STAGES OF RECOVERY BOOK: Freq: Once | Status: DC

## 2020-09-14 MED ORDER — BUDESONIDE 0.25 MG/2ML IN SUSP
0.2500 mg | Freq: Two times a day (BID) | RESPIRATORY_TRACT | Status: DC
  Administered 2020-09-15: 0.25 mg via RESPIRATORY_TRACT
  Filled 2020-09-14 (×4): qty 2

## 2020-09-14 MED ORDER — ACETAMINOPHEN 325 MG PO TABS: 650.0000 mg | ORAL_TABLET | ORAL | Status: DC | PRN

## 2020-09-14 MED ORDER — ALBUTEROL SULFATE HFA 108 (90 BASE) MCG/ACT IN AERS
2.0000 | INHALATION_SPRAY | Freq: Four times a day (QID) | RESPIRATORY_TRACT | Status: DC | PRN
  Filled 2020-09-14: qty 6.7

## 2020-09-14 MED ORDER — SODIUM CHLORIDE 0.9% FLUSH: 3.0000 mL | Freq: Once | INTRAVENOUS | Status: DC

## 2020-09-14 MED ORDER — SODIUM CHLORIDE 0.9 % IV SOLN: INTRAVENOUS | Status: DC

## 2020-09-14 MED ORDER — ACETAMINOPHEN 160 MG/5ML PO SOLN: 650.0000 mg | ORAL | Status: DC | PRN

## 2020-09-14 MED FILL — ELIQUIS 5 MG TABLET: 5 | 30 days supply | Qty: 60 | Fill #0

## 2020-09-14 NOTE — ED Notes (Signed)
Pt given a sandwich bag and diet coke

## 2020-09-14 NOTE — Telephone Encounter (Signed)
Pt reports just got home from hospital, TIA.  States showered and was bending over, got lightheaded."From the heat of shower."  Duration 5 minutes or less. States laid down, resolved. Had pt stand while on call, no dizziness. Reminded to hydrate. Unable to check BP at home. [facility] moved into retirement home. Advised to alert nurse of facility of history and any further dizziness go to ED. Advised S/S stroke, pt and wife verbalizes understanding.  Advised to CB if needed, no PCP.   Reason for Disposition . [1] MODERATE dizziness (e.g., interferes with normal activities) AND [2] has NOT been evaluated by physician for this  (Exception: dizziness caused by heat exposure, sudden standing, or poor fluid intake)    No PCP. Will go to ED if reoccurs  Answer Assessment - Initial Assessment Questions 1. DESCRIPTION: "Describe your dizziness."     Dizzy when bending over 2. LIGHTHEADED: "Do you feel lightheaded?" (e.g., somewhat faint, woozy, weak upon standing)     yes 3. VERTIGO: "Do you feel like either you or the room is spinning or tilting?" (i.e. vertigo)    no 4. SEVERITY: "How bad is it?"  "Do you feel like you are going to faint?" "Can you stand and walk?"   - MILD: Feels slightly dizzy, but walking normally.   - MODERATE: Feels very unsteady when walking, but not falling; interferes with normal activities (e.g., school, work) .   - SEVERE: Unable to walk without falling, or requires assistance to walk without falling; feels like passing out now.      moderate 5. ONSET:  "When did the dizziness begin?"    1 episode 5 minutes 6. AGGRAVATING FACTORS: "Does anything make it worse?" (e.g., standing, change in head position)     Bend ing over 7. HEART RATE: "Can you tell me your heart rate?" "How many beats in 15 seconds?"  (Note: not all patients can do this)       no 8. CAUSE: "What do you think is causing the dizziness?"     heat 9. RECURRENT SYMPTOM: "Have you had dizziness before?" If Yes,  ask: "When was the last time?" "What happened that time?"    no 10. OTHER SYMPTOMS: "Do you have any other symptoms?" (e.g., fever, chest pain, vomiting, diarrhea, bleeding)      no  Protocols used: DIZZINESS [facility]

## 2020-09-14 NOTE — Discharge Instructions (Signed)

## 2020-09-14 NOTE — ED Triage Notes (Signed)
Pt arrived to ED via [facility] from home as a Code Stroke w/ aphasia that started today LKW 1250. Per EMS s/s improved en route. Pt reports previous episode of the same on Saturday 09-11-20.

## 2020-09-14 NOTE — Progress Notes (Signed)
EEG Completed; Results Pending  

## 2020-09-14 NOTE — Progress Notes (Signed)
STROKE TEAM PROGRESS NOTE   INTERVAL HISTORY Wife at the bedside. Pt lying in bed, AAO x 3. Neuro intact. Dr. Nelson Chimes has contacted his cardiology in [place] and confirmed he did have short (secs) afib on pacemaker but very low burden, no AC started. This admission, Dr. Lalla Brothers also saw aflutter on pacer interrogation, short duration. Given the TIA episode, will start eliquis for him.   OBJECTIVE Vitals:   09/13/20 1216 09/13/20 1830 09/13/20 2013 09/14/20 0536  BP: 139/67 125/64 131/67 137/68  Pulse: 60 60 63 60  Resp: 16 17 16 15   Temp: 97.6 F (36.4 C) (!) 97.5 F (36.4 C) 98.4 F (36.9 C) 97.9 F (36.6 C)  TempSrc: Oral Oral Oral Oral  SpO2: 98% 97%  97%  Weight:      Height:       CBC:  Recent Labs  Lab 09/11/20 2237 09/11/20 2243 09/13/20 0629 09/14/20 0129  WBC 4.1   < > 4.2 4.1  NEUTROABS 2.0  --   --   --   HGB 10.9*   < > 11.6* 12.0*  HCT 31.5*   < > 33.7* 34.5*  MCV 98.4   < > 95.7 95.3  PLT 156   < > 148* 161   < > = values in this interval not displayed.   Basic Metabolic Panel:  Recent Labs  Lab 09/13/20 0629 09/14/20 0129  NA 138 138  K 4.1 4.2  CL 107 105  CO2 24 25  GLUCOSE 96 108*  BUN 21 21  CREATININE 1.05 1.02  CALCIUM 8.8* 9.1  MG 2.0 1.9   Lipid Panel:     Component Value Date/Time   CHOL 110 09/12/2020 0500   TRIG 80 09/12/2020 0500   HDL 36 (L) 09/12/2020 0500   CHOLHDL 3.1 09/12/2020 0500   VLDL 16 09/12/2020 0500   LDLCALC 58 09/12/2020 0500   HgbA1c:  Lab Results  Component Value Date   HGBA1C 5.4 09/12/2020   Urine Drug Screen:     Component Value Date/Time   LABOPIA NONE DETECTED 09/12/2020 0051   COCAINSCRNUR NONE DETECTED 09/12/2020 0051   LABBENZ NONE DETECTED 09/12/2020 0051   AMPHETMU NONE DETECTED 09/12/2020 0051   THCU NONE DETECTED 09/12/2020 0051   LABBARB NONE DETECTED 09/12/2020 0051    Alcohol Level     Component Value Date/Time   ETH <10 09/12/2020 0110    IMAGING  CT Angio Head W or Wo  Contrast CT Angio Neck W and/or Wo Contrast 09/12/2020 1. No emergent large vessel occlusion or high-grade stenosis of the intracranial arteries.  2. Eccentric plaque within the proximal left internal carotid artery may be at increased risk of embolus formation.  3. Bilateral carotid bifurcation atherosclerosis without hemodynamically significant stenosis by NASCET criteria.  4. Enlarged heterogeneous right thyroid lobe. Recommend thyroid ultrasound on a nonemergent basis (ref: J Am Coll Radiol. 2015 Feb;12(2): 143-50).  Aortic Atherosclerosis (ICD10-I70.0).   CT HEAD WO CONTRAST 09/13/2020 No hemorrhage or visible infarct. Aging brain.   ECHOCARDIOGRAM COMPLETE 09/12/2020 1. Left ventricular ejection fraction, by estimation, is 55 to 60%. The left ventricle has normal function. The left ventricle has no regional wall motion abnormalities. There is mild concentric left ventricular hypertrophy. Left ventricular diastolic parameters are indeterminate.   2. Right ventricular systolic function is normal. The right ventricular size is normal. There is normal pulmonary artery systolic pressure. The estimated right ventricular systolic pressure is 21.8 mmHg.   3. The mitral valve is normal  in structure. Trivial mitral valve regurgitation. No evidence of mitral stenosis.   4. The aortic valve is tricuspid. Aortic valve regurgitation is mild. Mild aortic valve sclerosis is present, with no evidence of aortic valve stenosis.   5. Aortic dilatation noted. There is mild dilatation of the aortic root, measuring 39 mm.   6. The inferior vena cava is normal in size with greater than 50% respiratory variability, suggesting right atrial pressure of 3 mmHg.   ECG - paced rate 62 BPM. (See cardiology reading for complete details)   PHYSICAL EXAM   Temp:  [97.5 F (36.4 C)-98.4 F (36.9 C)] 97.9 F (36.6 C) (11/02 0536) Pulse Rate:  [60-63] 60 (11/02 0536) Resp:  [15-17] 15 (11/02 0536) BP:  (125-139)/(64-68) 137/68 (11/02 0536) SpO2:  [97 %-98 %] 97 % (11/02 0536)  General - Well nourished, well developed, in no apparent distress.  Ophthalmologic - fundi not visualized due to noncooperation.  Cardiovascular - Regular rhythm and rate.  Mental Status -  Level of arousal and orientation to time, place, and person were intact. Language including expression, naming, repetition, comprehension was assessed and found intact. Fund of Knowledge was assessed and was intact.  Cranial Nerves II - XII - II - Visual field intact OU. III, IV, VI - Extraocular movements intact. V - Facial sensation intact bilaterally. VII - Facial movement intact bilaterally. VIII - Hearing & vestibular intact bilaterally. X - Palate elevates symmetrically. XI - Chin turning & shoulder shrug intact bilaterally. XII - Tongue protrusion intact.  Motor Strength - The patient's strength was normal in all extremities and pronator drift was absent.  Bulk was normal and fasciculations were absent.   Motor Tone - Muscle tone was assessed at the neck and appendages and was normal.  Reflexes - The patient's reflexes were symmetrical in all extremities and he had no pathological reflexes.  Sensory - Light touch, temperature/pinprick were assessed and were symmetrical.    Coordination - The patient had normal movements in the hands with no ataxia or dysmetria.  Tremor was absent.  Gait and Station - walking with PT in hallway, no fall tendency, chronic limp on the left side.    ASSESSMENT/PLAN [name] is a 79 y.o. male who recently moved to [facility] from [place] with a PMHx of Htn, GERD, ? atrial fibrillation, bradycardia s/p pacemaker in 2016. The pt was having dinner and suddenly developed word finding difficulty. Prior to symptoms patient started to feel off with associated headache ~2000 and his wife noticed that he unable to find his words and at times speech did not make sense. He did not  receive IV t-PA due to improvement in deficits.  TIA secondary to AF not on AC vs. Soft plaque at left ICA bulb  CT head - no acute abnormality  MRI head - not able to do due to pacer not compatible with MRI  CTA H&N - No emergent large vessel occlusion or high-grade stenosis of the intracranial arteries. Eccentric plaque within the proximal left internal carotid artery may be at increased risk of embolus formation. Bilateral carotid bifurcation atherosclerosis without hemodynamically significant stenosis by NASCET criteria.   CT repeat Unremarkable   2D Echo - EF 55-60%. No source of embolus   Pacemaker interrogation +SVT, short run of Aflutter per Dr. Shelle [employer] Virus 2 - neg  LDL - 58  HgbA1c - 5.4  UDS - negative  VTE prophylaxis - Lovenox  aspirin 81 mg daily prior  to admission, now on aspirin 81 mg daily and clopidogrel 75 mg daily DAPT   Therapy recommendations:  None  Disposition:  Pending  PAF/Aflutter Vtach  SVT  A. fib listed in his medical problem list  Pt cardiologist in [place] Dr. Virgina Organ contacted by Dr. Nelson Chimes and confirmed that pt did have short run of afib but low burden in the past. No AC started   Pacemaker interrogation +SVT, short run of aflutter per Dr. Lalla Brothers    Asymptomatic Vtach 12-13 beats 10/31  Will start eliquis for stroke prevention  OK to continue metoprolol as home meds  Pt moved from [place] to [place] days ago, will set up with Dr. Lalla Brothers follow up as outpt.   Hypertension  Home BP meds: metoprolol 25 daily  Current BP meds: none   OK to continue metoprolol home meds on discharge  Stable . Long-term BP goal normotensive  Hyperlipidemia  Home Lipid lowering medication: pravastatin 40  LDL 58, goal < 70  Resumed pravastatin 40 mg daily   Continue statin at discharge  Other Stroke Risk Factors  Advanced age  ETOH use, advised to drink no more than 2 alcoholic beverage per day.  Family hx  strokes  Other Active Problems  Code status - Full code   Anemia - Hgb - 10.9->9.9->11.6  Aortic Atherosclerosis (ICD10-I70.0)  Bradycardia s/p pacer  GERD  Hospital day # 2  Neurology will sign off. Please call with questions. Pt will follow up with stroke clinic NP at [facility] in about 4 weeks. Thanks for the consult.   Marvel Plan, MD PhD Stroke Neurology 09/14/2020 10:40 AM   To contact Stroke Continuity provider, please refer to [email or web address]. After hours, contact General Neurology

## 2020-09-14 NOTE — H&P (Signed)
History and Physical   [name]:[id number] DOB: July 15, 1941 DOA: 09/14/2020  PCP: Patient, No Pcp Per  Patient coming from: Home  I have personally briefly reviewed patient's old medical records in [facility] EMR.  Chief Concern: Expressive aphasia  HPI: [name] is a 79 y.o. male with medical history significant for hypertension, paroxysmal A. fib, TIA, presented to the emergency department via EMS for chief concern of garbled speech within 15 minutes of arriving home from the hospital.  Patient and spouse unsure of duration of garbled speech, however states EMS says that patient had difficulty speaking for likely an hour.  Spouse further reports that prior to patient's garbled speech he did have a blank stare into the distance for about 15 to 20 minutes.  The blank stare is new.  Review of system was negative for headache, fever, chills, cough, chest pain, shortness of breath, abdominal pain, dysuria, hematuria, diarrhea, constipation, difficulty walking, insomnia.  ED Course: Discussed with ED provider.  Patient was recently discharged today with admission for TIA.  ED provider contacted neurology and neurology recommends observation admission for EEG and reevaluation of CTA for possible vascular consult  Review of Systems: As per HPI otherwise 10 point review of systems negative.  Past Medical History:  Diagnosis Date  . GERD (gastroesophageal reflux disease)   . Hypertension    Past Surgical History:  Procedure Laterality Date  . HERNIA REPAIR    . PACEMAKER IMPLANT    . TONSILLECTOMY     Social History:  reports current alcohol use. No history on file for tobacco use and drug use.  No Known Allergies  History reviewed. No pertinent family history. Family history: Family history reviewed and not pertinent  Prior to Admission medications   Medication Sig Start Date End Date Taking? Authorizing Provider  albuterol (VENTOLIN HFA) 108 (90 Base) MCG/ACT inhaler  Inhale 2 puffs into the lungs 4 (four) times daily as needed for wheezing or shortness of breath.   Yes [provider]  apixaban (ELIQUIS) 5 MG TABS tablet Take 1 tablet (5 mg total) by mouth 2 (two) times daily. 09/14/20  Yes Amin, Loura Halt, MD  cholecalciferol (VITAMIN D) 25 MCG (1000 UNIT) tablet Take 1,000 Units by mouth at bedtime.   Yes [provider]  Fluticasone Furoate (ARNUITY ELLIPTA) 200 MCG/ACT AEPB Inhale 1 puff into the lungs every morning.   Yes [provider]  Magnesium 250 MG TABS Take 250 mg by mouth every morning.   Yes [provider]  metoprolol tartrate (LOPRESSOR) 50 MG tablet Take 25 mg by mouth daily.   Yes [provider]  Multiple Vitamin (MULTIVITAMIN WITH MINERALS) TABS tablet Take 1 tablet by mouth every morning.   Yes [provider]  Omega-3 Fatty Acids (FISH OIL PO) Take 1 capsule by mouth every morning.   Yes [provider]  omeprazole (PRILOSEC) 40 MG capsule Take 40 mg by mouth daily before supper.   Yes [provider]  pravastatin (PRAVACHOL) 40 MG tablet Take 40 mg by mouth at bedtime.   Yes [provider]   Physical Exam: Vitals:   09/14/20 1500 09/14/20 1515 09/14/20 1530 09/14/20 1600  BP: 125/65 130/61 120/87 122/84  Pulse: (!) 59 60 64 62  Resp: 18 14 (!) 23 (!) 24  Temp:      TempSrc:      SpO2: 100% 97% 95% 100%  Weight:      Height:  Constitutional: NAD, calm, comfortable Eyes: PERRL, lids and conjunctivae normal ENMT: Mucous membranes are moist. Posterior pharynx clear of any exudate or lesions.Normal dentition.  Neck: normal, supple, no masses, no thyromegaly Respiratory: clear to auscultation bilaterally, no wheezing, no crackles. Normal respiratory effort. No accessory muscle use.  Cardiovascular: Regular rate and rhythm, no murmurs / rubs / gallops. No extremity edema. 2+ pedal pulses. No carotid bruits.  Abdomen: no tenderness, no masses  palpated. No hepatosplenomegaly. Bowel sounds positive.  Musculoskeletal: no clubbing / cyanosis. No joint deformity upper and lower extremities. Good ROM, no contractures. Normal muscle tone.  Skin: no rashes, lesions, ulcers. No induration Neurologic: CN 2-12 grossly intact. Sensation intact. Strength 5/5 in all 4.  Psychiatric: Normal judgment and insight. Alert and oriented x 3. Normal mood.   Labs on Admission: I have personally reviewed following labs and imaging studies  CBC: Recent Labs  Lab 09/11/20 2237 09/11/20 2237 09/11/20 2243 09/13/20 0629 09/14/20 0129 09/14/20 1355 09/14/20 1408  WBC 4.1  --   --  4.2 4.1 5.3  --   NEUTROABS 2.0  --   --   --   --  2.6  --   HGB 10.9*   < > 9.9* 11.6* 12.0* 13.1 12.2*  HCT 31.5*   < > 29.0* 33.7* 34.5* 38.0* 36.0*  MCV 98.4  --   --  95.7 95.3 98.4  --   PLT 156  --   --  148* 161 167  --    < > = values in this interval not displayed.   Basic Metabolic Panel: Recent Labs  Lab 09/11/20 2237 09/11/20 2237 09/11/20 2243 09/13/20 0629 09/14/20 0129 09/14/20 1355 09/14/20 1408  NA 135   < > 139 138 138 137 137  K 4.0   < > 4.0 4.1 4.2 4.2 4.1  CL 105   < > 106 107 105 105 103  CO2 23  --   --  24 25 24   --   GLUCOSE 114*   < > 112* 96 108* 89 81  BUN 30*   < > 29* 21 21 24* 25*  CREATININE 1.21   < > 1.20 1.05 1.02 1.12 1.10  CALCIUM 8.5*  --   --  8.8* 9.1 9.2  --   MG  --   --   --  2.0 1.9  --   --    < > = values in this interval not displayed.   GFR: Estimated Creatinine Clearance: 55 mL/min (by C-G formula based on SCr of 1.1 mg/dL). Liver Function Tests: Recent Labs  Lab 09/11/20 2237 09/14/20 1355  AST 24 31  ALT 23 27  ALKPHOS 86 91  BILITOT 0.9 0.9  PROT 5.7* 6.7  ALBUMIN 3.4* 3.7   No results for input(s): LIPASE, AMYLASE in the last 168 hours. No results for input(s): AMMONIA in the last 168 hours. Coagulation Profile: Recent Labs  Lab 09/11/20 2237 09/14/20 1355  INR 1.1 1.2    HbA1C: Recent Labs    09/12/20 0500  HGBA1C 5.4   CBG: Recent Labs  Lab 09/11/20 2150 09/14/20 1354  GLUCAP 106* 86   Lipid Profile: Recent Labs    09/12/20 0500  CHOL 110  HDL 36*  LDLCALC 58  TRIG 80  CHOLHDL 3.1   Thyroid Function Tests: Recent Labs    09/13/20 0626  TSH 1.066   Anemia Panel: Recent Labs    09/12/20 0425 09/13/20 0626  VITAMINB12  --  240  FOLATE  --  28.1  FERRITIN  --  45  TIBC  --  329  IRON  --  75  RETICCTPCT 0.9  --    Urine analysis:    Component Value Date/Time   COLORURINE STRAW (A) 09/12/2020 0052   APPEARANCEUR CLEAR 09/12/2020 0052   LABSPEC 1.017 09/12/2020 0052   PHURINE 7.0 09/12/2020 0052   GLUCOSEU NEGATIVE 09/12/2020 0052   HGBUR NEGATIVE 09/12/2020 0052   BILIRUBINUR NEGATIVE 09/12/2020 0052   KETONESUR NEGATIVE 09/12/2020 0052   PROTEINUR NEGATIVE 09/12/2020 0052   NITRITE NEGATIVE 09/12/2020 0052   LEUKOCYTESUR NEGATIVE 09/12/2020 0052   Radiological Exams on Admission: Personally reviewed and I agree with radiologist reading as below.  CT Code Stroke CTA Head W/WO contrast  Result Date: 09/14/2020 CLINICAL DATA:  Aphasia EXAM: CT ANGIOGRAPHY HEAD AND NECK TECHNIQUE: Multidetector CT imaging of the head and neck was performed using the standard protocol during bolus administration of intravenous contrast. Multiplanar CT image reconstructions and MIPs were obtained to evaluate the vascular anatomy. Carotid stenosis measurements (when applicable) are obtained utilizing NASCET criteria, using the distal internal carotid diameter as the denominator. CONTRAST:  75mL OMNIPAQUE IOHEXOL 350 MG/ML SOLN COMPARISON:  09/12/2020 FINDINGS: CTA NECK Aortic arch: Great vessel origins are patent. Right carotid system: Patent. Calcified plaque at the ICA origin with less than 50% stenosis. Left carotid system: Patent. Calcified and irregular fibrofatty plaque with possible ulceration causing less than 50% stenosis. Vertebral  arteries: Patent.  Left vertebral artery is dominant. Skeleton: Stable advanced degenerative changes of the cervical spine. Other neck: No new findings.  Right thyroid as previously described. Upper chest: No apical lung mass. Review of the MIP images confirms the above findings CTA HEAD Anterior circulation: Intracranial internal carotid arteries are patent with mild calcified plaque. Anterior cerebral arteries are patent with anterior communicating artery present. Left A1 ACA is dominant. Middle cerebral arteries are patent. Posterior circulation: Intracranial vertebral arteries are patent with mild calcified plaque on the left. Patent PICA origins. Basilar artery is patent. Superior cerebellar artery origins are patent. Posterior cerebral arteries are patent. Small left posterior communicating artery is present. Venous sinuses: Patent as allowed by contrast bolus timing. Review of the MIP images confirms the above findings IMPRESSION: No significant change since 09/12/2020. No new large vessel occlusion or hemodynamically significant stenosis. 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 Electronically Signed   By: Guadlupe SpanishPraneil  Patel M.D.   On: 09/14/2020 16:08   CT HEAD WO CONTRAST  Result Date: 09/13/2020 CLINICAL DATA:  Follow-up TIA EXAM: CT HEAD WITHOUT CONTRAST TECHNIQUE: Contiguous axial images were obtained from the base of the skull through the vertex without intravenous contrast. COMPARISON:  CT a of the head neck from yesterday FINDINGS: Brain: No evidence of acute infarction, hemorrhage, hydrocephalus, extra-axial collection or mass lesion/mass effect. Generalized atrophy and confluent chronic small vessel ischemic low-density in the white matter, especially about the frontal horns. Vascular: No hyperdense vessel or unexpected calcification. Skull: Normal. Negative for fracture or focal lesion. Sinuses/Orbits: Negative IMPRESSION: No hemorrhage or  visible infarct. Aging brain. Electronically Signed   By: Marnee SpringJonathon  Watts M.D.   On: 09/13/2020 05:42   CT Code Stroke CTA Neck W/WO contrast  Result Date: 09/14/2020 CLINICAL DATA:  Aphasia EXAM: CT ANGIOGRAPHY HEAD AND NECK TECHNIQUE: Multidetector CT imaging of the head and neck was performed using the standard protocol during bolus administration of intravenous contrast. Multiplanar CT  image reconstructions and MIPs were obtained to evaluate the vascular anatomy. Carotid stenosis measurements (when applicable) are obtained utilizing NASCET criteria, using the distal internal carotid diameter as the denominator. CONTRAST:  75mL OMNIPAQUE IOHEXOL 350 MG/ML SOLN COMPARISON:  09/12/2020 FINDINGS: CTA NECK Aortic arch: Great vessel origins are patent. Right carotid system: Patent. Calcified plaque at the ICA origin with less than 50% stenosis. Left carotid system: Patent. Calcified and irregular fibrofatty plaque with possible ulceration causing less than 50% stenosis. Vertebral arteries: Patent.  Left vertebral artery is dominant. Skeleton: Stable advanced degenerative changes of the cervical spine. Other neck: No new findings.  Right thyroid as previously described. Upper chest: No apical lung mass. Review of the MIP images confirms the above findings CTA HEAD Anterior circulation: Intracranial internal carotid arteries are patent with mild calcified plaque. Anterior cerebral arteries are patent with anterior communicating artery present. Left A1 ACA is dominant. Middle cerebral arteries are patent. Posterior circulation: Intracranial vertebral arteries are patent with mild calcified plaque on the left. Patent PICA origins. Basilar artery is patent. Superior cerebellar artery origins are patent. Posterior cerebral arteries are patent. Small left posterior communicating artery is present. Venous sinuses: Patent as allowed by contrast bolus timing. Review of the MIP images confirms the above findings IMPRESSION:  No significant change since 09/12/2020. No new large vessel occlusion or hemodynamically significant stenosis. 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 Electronically Signed   By: Guadlupe Spanish M.D.   On: 09/14/2020 16:08   CT HEAD CODE STROKE WO CONTRAST  Result Date: 09/14/2020 CLINICAL DATA:  Code stroke.  Follow-up stroke presentation. EXAM: CT HEAD WITHOUT CONTRAST TECHNIQUE: Contiguous axial images were obtained from the base of the skull through the vertex without intravenous contrast. COMPARISON:  CT studies 09/12/2020 and 09/14/2019. FINDINGS: Brain: Age related atrophy. Chronic small-vessel ischemic changes of the cerebral hemispheric white matter. No sign of acute infarction, mass lesion, hemorrhage, hydrocephalus or extra-axial collection. Vascular: There is atherosclerotic calcification of the major vessels at the base of the brain. Skull: Negative Sinuses/Orbits: Clear/normal Other: None ASPECTS (Alberta Stroke Program Early CT Score) - Ganglionic level infarction (caudate, lentiform nuclei, internal capsule, insula, M1-M3 cortex): 7 - Supraganglionic infarction (M4-M6 cortex): 3 Total score (0-10 with 10 being normal): 10 IMPRESSION: 1. No acute finding by CT. Age related atrophy and chronic small-vessel ischemic changes of the white matter. 2. ASPECTS is 10. Electronically Signed   By: Paulina Fusi M.D.   On: 09/14/2020 14:05   EKG: Independently reviewed, showing atrial paced rhythm, no acute ST-T wave changes  Assessment/Plan  Active Problems:   TIA (transient ischemic attack)   Aphasia suspect TIA Recent hospitalization for similar symptoms reviewed 09/11/2020 to 09/14/2020: CTA of the head and neck negative for large vessel occlusion or high-grade stenosis but showed left ICA plaque, no MRI due to presence of an older pacemaker, [employer] device interrogated possible a flutter on interrogation for short duration, and  neurology recommended Eliquis twice daily; PT OT no follow-up; echo ordered showed EF of 60% with mild LVH -Follows cardiology in [place] -Neurology has been consulted -Orthostatic vitals completed and negative -Neurology recommends EEG, repeat CTA of the head and neck to evaluate for left ICA clot stability, per neurology should there be significant interval development of stenosis neurology will discuss with vascular surgery -Admit to observation overnight -A1c on 09/12/2020 was 5.4 -Lipid panel on 09/12/2020 showed cholesterol 110, triglycerides 80, HDL 36, LDL  58 -B12 on 09/13/2020 was 240 -TSH was within normal limits, folate 28.1, ferritin 45, normal iron panel on 09/13/2020  Minimal burden A. fib-Eliquis 5 mg twice daily, hold at this time Hypertension-controlled, home medications were not resumed  DVT prophylaxis: On home Eliquis 5 mg twice daily, on hold Code Status: Full code Diet: Resume diet once speech swallow has passed Family Communication: Discussed with wife at bedside Disposition Plan: Pending clinical course Consults called: Neurology Admission status: Observation with telemetry  Nedra Mcinnis N Hasten Sweitzer D.O. [facility]  If 7AM-7PM, please contact day-coverage provider [email or web address]  09/14/2020, 4:52 PM

## 2020-09-14 NOTE — Code Documentation (Signed)
Stroke Response Nurse Documentation Code Documentation  [name] is a 79 y.o. male arriving to [facility] ED via [facility] on 11/2 with past medical hx of TIA. Code stroke was activated by EMS. Patient from home where he was LKW at 1250 and now complaining of trouble speaking. On aspirin 81 mg daily, clopidogrel 75 mg daily and Eliquis (apixaban) daily.   Stroke team at the bedside on patient arrival. Labs drawn and patient cleared for CT by Dr. Charm Barges. Patient to CT with team. NIHSS 0, see documentation for details and code stroke times. No symptoms upon assessment. The following imaging was completed: CT. Patient is not a candidate for tPA due to fully resolving and contraindicated on Eliquis. Care/Plan: q2 Hour mNIHSS/VS. Bedside handoff with ED RN Irving Burton.    Lucila Maine  Stroke Response RN

## 2020-09-14 NOTE — Discharge Summary (Signed)
Physician Discharge Summary  [name] TMH:[id number] DOB: [birth year] DOA: 09/11/2020  PCP: Patient, No Pcp Per  Admit date: 09/11/2020 Discharge date: 09/14/2020  Admitted From: Home Disposition: Home  Recommendations for Outpatient Follow-up:  1. Follow up with PCP in 1-2 weeks 2. Please obtain BMP/CBC in one week your next doctors visit.  3. Eliquis 5 mg twice daily   Discharge Condition: Stable CODE STATUS: Full code Diet recommendation: Heart healthy  Brief/Interim Summary: 79 year old with history of arrhythmia status post pacemaker, HTN, GERD admitted for slurred speech.  He follows with a cardiologist in [place].  Most of his work-up was unremarkable, LDL 58, A1c 5.4, UA, UDS both negative.  CTA head and neck negative for any high-grade stenosis or large vessel occlusion but showed left ICA plaque and thyroid nodule.Pacer was interrogated. Echo showed ef 60%.  There was concerns of A. fib.  Also spoke with Dr. Virgina Organ RN, who discussed the case with him, stated patient had minimal burden of A. fib last year but did not start him on anticoagulation as he was a low risk.  Neurology recommended starting patient on Eliquis.  Patient stable to be discharged outpatient follow-up.   Assessment & Plan:   Principal Problem:   Aphasia Active Problems:   Presence of permanent cardiac pacemaker   Essential hypertension   Anemia   TIA (transient ischemic attack)   Slurred speech probably due to acute TIA/CVA -Concerning for TIA/CVA.  CTA head and neck negative for any large vessel occlusion or high-grade stenosis but showed left ICA plaque -LDL 58, A1c 5.4, UA and UDS-negative -Unable to get MRI as his pacemaker is not safe for MRI -Has [employer] device interrogated-possible A-flutter on interrogation for short duration.  I also discussed case with his primary cardiologist in [place] who stated he had minimal burden A. fib last year but did not warrant anticoagulation at  that time. -Neurology recommending Eliquis twice daily -PT/OT - no follow up.  -Echocardiogram = ef 60% with mild LVH   Essential hypertension  -Resume home meds  GERD  -Resume home meds  Arrhythmia?  With pacemaker in place -Noncompatible with MRI   Body mass index is 22.96 kg/m.         Discharge Diagnoses:  Principal Problem:   Aphasia Active Problems:   Presence of permanent cardiac pacemaker   Essential hypertension   Anemia   TIA (transient ischemic attack)     Subjective: Feels good no complaints wants to go home.  Wife is at bedside as well.  He is at baseline.  Discharge Exam: Vitals:   09/13/20 2013 09/14/20 0536  BP: 131/67 137/68  Pulse: 63 60  Resp: 16 15  Temp: 98.4 F (36.9 C) 97.9 F (36.6 C)  SpO2:  97%   Vitals:   09/13/20 1216 09/13/20 1830 09/13/20 2013 09/14/20 0536  BP: 139/67 125/64 131/67 137/68  Pulse: 60 60 63 60  Resp: 16 17 16 15   Temp: 97.6 F (36.4 C) (!) 97.5 F (36.4 C) 98.4 F (36.9 C) 97.9 F (36.6 C)  TempSrc: Oral Oral Oral Oral  SpO2: 98% 97%  97%  Weight:      Height:        General: Pt is alert, awake, not in acute distress Cardiovascular: RRR, S1/S2 +, no rubs, no gallops Respiratory: CTA bilaterally, no wheezing, no rhonchi Abdominal: Soft, NT, ND, bowel sounds + Extremities: no edema, no cyanosis  Discharge Instructions  Discharge Instructions    Ambulatory referral to  Cardiology   Complete by: As directed    Follow up with Dr. Lalla Brothers in 2 weeks. Thanks.   Ambulatory referral to Neurology   Complete by: As directed    Follow up with stroke clinic NP (Jessica Vanschaick or Darrol Angel, if both not available, consider Manson Allan, or Ahern) at [facility] in about 4 weeks. Thanks.     Allergies as of 09/14/2020   No Known Allergies     Medication List    STOP taking these medications   Advil 200 MG tablet Generic drug: ibuprofen   aspirin EC 81 MG tablet     TAKE these  medications   albuterol 108 (90 Base) MCG/ACT inhaler Commonly known as: VENTOLIN HFA Inhale 2 puffs into the lungs 4 (four) times daily as needed for wheezing or shortness of breath.   apixaban 5 MG Tabs tablet Commonly known as: ELIQUIS Take 1 tablet (5 mg total) by mouth 2 (two) times daily.   Arnuity Ellipta 200 MCG/ACT Aepb Generic drug: Fluticasone Furoate Inhale 1 puff into the lungs every morning.   cholecalciferol 25 MCG (1000 UNIT) tablet Commonly known as: VITAMIN D Take 1,000 Units by mouth at bedtime.   FISH OIL PO Take 1 capsule by mouth every morning.   Magnesium 250 MG Tabs Take 250 mg by mouth every morning.   metoprolol tartrate 50 MG tablet Commonly known as: LOPRESSOR Take 25 mg by mouth daily.   multivitamin with minerals Tabs tablet Take 1 tablet by mouth every morning.   omeprazole 40 MG capsule Commonly known as: PRILOSEC Take 40 mg by mouth daily before supper.   pravastatin 40 MG tablet Commonly known as: PRAVACHOL Take 40 mg by mouth at bedtime.       Follow-up Information    [facility]. Schedule an appointment as soon as possible for a visit in 4 week(s).   Specialty: Neurology Contact information: [address] [place] [address] [phone]       Lanier Prude, MD. Schedule an appointment as soon as possible for a visit in 2 week(s).   Specialties: Cardiology, Radiology Contact information: [address] [place] [address] [phone]              No Known Allergies  You were cared for by a hospitalist during your hospital stay. If you have any questions about your discharge medications or the care you received while you were in the hospital after you are discharged, you can call the unit and asked to speak with the hospitalist on call if the hospitalist that took care of you is not available. Once you are discharged, your primary care physician will handle  any further medical issues. Please note that no refills for any discharge medications will be authorized once you are discharged, as it is imperative that you return to your primary care physician (or establish a relationship with a primary care physician if you do not have one) for your aftercare needs so that they can reassess your need for medications and monitor your lab values.   Procedures/Studies: CT Angio Head W or Wo Contrast  Result Date: 09/12/2020 CLINICAL DATA:  Stroke/TIA EXAM: CT ANGIOGRAPHY HEAD AND NECK TECHNIQUE: Multidetector CT imaging of the head and neck was performed using the standard protocol during bolus administration of intravenous contrast. Multiplanar CT image reconstructions and MIPs were obtained to evaluate the vascular anatomy. Carotid stenosis measurements (when applicable) are obtained utilizing NASCET criteria, using  the distal internal carotid diameter as the denominator. CONTRAST:  OMNIPAQUE IOHEXOL 350 MG/ML SOLN COMPARISON:  None. FINDINGS: CT HEAD FINDINGS Brain: There is no mass, hemorrhage or extra-axial collection. There is generalized atrophy without lobar predilection. There is hypoattenuation of the periventricular white matter, most commonly indicating chronic ischemic microangiopathy. Skull: The visualized skull base, calvarium and extracranial soft tissues are normal. Sinuses/Orbits: No fluid levels or advanced mucosal thickening of the visualized paranasal sinuses. No mastoid or middle ear effusion. The orbits are normal. CTA NECK FINDINGS SKELETON: There is no bony spinal canal stenosis. No lytic or blastic lesion. OTHER NECK: Enlarged heterogeneous right thyroid lobe. UPPER CHEST: No pneumothorax or pleural effusion. No nodules or masses. AORTIC ARCH: There is calcific atherosclerosis of the aortic arch. There is no aneurysm, dissection or hemodynamically significant stenosis of the visualized portion of the aorta. Conventional 3 vessel aortic  branching pattern. The visualized proximal subclavian arteries are widely patent. RIGHT CAROTID SYSTEM: No dissection, occlusion or aneurysm. There is calcified atherosclerosis extending into the proximal ICA, resulting in less than 50% stenosis. LEFT CAROTID SYSTEM: There is eccentric plaque at the left carotid bifurcation extending into the proximal left internal carotid artery. There is no hemodynamically significant stenosis. VERTEBRAL ARTERIES: Left dominant configuration. Both origins are clearly patent. There is no dissection, occlusion or flow-limiting stenosis to the skull base (V1-V3 segments). CTA HEAD FINDINGS POSTERIOR CIRCULATION: --Vertebral arteries: Normal V4 segments. --Inferior cerebellar arteries: Normal. --Basilar artery: Normal. --Superior cerebellar arteries: Normal. --Posterior cerebral arteries (PCA): Normal. ANTERIOR CIRCULATION: --Intracranial internal carotid arteries: Atherosclerotic calcification of the internal carotid arteries at the skull base without hemodynamically significant stenosis. --Anterior cerebral arteries (ACA): Normal. Both A1 segments are present. Patent anterior communicating artery (a-comm). --Middle cerebral arteries (MCA): Normal. VENOUS SINUSES: As permitted by contrast timing, patent. ANATOMIC VARIANTS: None Review of the MIP images confirms the above findings. IMPRESSION: 1. No emergent large vessel occlusion or high-grade stenosis of the intracranial arteries. 2. Eccentric plaque within the proximal left internal carotid artery may be at increased risk of embolus formation. 3. Bilateral carotid bifurcation atherosclerosis without hemodynamically significant stenosis by NASCET criteria. 4. Enlarged heterogeneous right thyroid lobe. Recommend thyroid ultrasound on a nonemergent basis (ref: J Am Coll Radiol. 2015 Feb;12(2): 143-50). Aortic Atherosclerosis (ICD10-I70.0). Electronically Signed   By: Deatra Robinson M.D.   On: 09/12/2020 03:15   CT HEAD WO  CONTRAST  Result Date: 09/13/2020 CLINICAL DATA:  Follow-up TIA EXAM: CT HEAD WITHOUT CONTRAST TECHNIQUE: Contiguous axial images were obtained from the base of the skull through the vertex without intravenous contrast. COMPARISON:  CT a of the head neck from yesterday FINDINGS: Brain: No evidence of acute infarction, hemorrhage, hydrocephalus, extra-axial collection or mass lesion/mass effect. Generalized atrophy and confluent chronic small vessel ischemic low-density in the white matter, especially about the frontal horns. Vascular: No hyperdense vessel or unexpected calcification. Skull: Normal. Negative for fracture or focal lesion. Sinuses/Orbits: Negative IMPRESSION: No hemorrhage or visible infarct. Aging brain. Electronically Signed   By: Marnee Spring M.D.   On: 09/13/2020 05:42   CT Angio Neck W and/or Wo Contrast  Result Date: 09/12/2020 CLINICAL DATA:  Stroke/TIA EXAM: CT ANGIOGRAPHY HEAD AND NECK TECHNIQUE: Multidetector CT imaging of the head and neck was performed using the standard protocol during bolus administration of intravenous contrast. Multiplanar CT image reconstructions and MIPs were obtained to evaluate the vascular anatomy. Carotid stenosis measurements (when applicable) are obtained utilizing NASCET criteria, using the distal internal carotid diameter as  the denominator. CONTRAST:  OMNIPAQUE IOHEXOL 350 MG/ML SOLN COMPARISON:  None. FINDINGS: CT HEAD FINDINGS Brain: There is no mass, hemorrhage or extra-axial collection. There is generalized atrophy without lobar predilection. There is hypoattenuation of the periventricular white matter, most commonly indicating chronic ischemic microangiopathy. Skull: The visualized skull base, calvarium and extracranial soft tissues are normal. Sinuses/Orbits: No fluid levels or advanced mucosal thickening of the visualized paranasal sinuses. No mastoid or middle ear effusion. The orbits are normal. CTA NECK FINDINGS SKELETON: There is no  bony spinal canal stenosis. No lytic or blastic lesion. OTHER NECK: Enlarged heterogeneous right thyroid lobe. UPPER CHEST: No pneumothorax or pleural effusion. No nodules or masses. AORTIC ARCH: There is calcific atherosclerosis of the aortic arch. There is no aneurysm, dissection or hemodynamically significant stenosis of the visualized portion of the aorta. Conventional 3 vessel aortic branching pattern. The visualized proximal subclavian arteries are widely patent. RIGHT CAROTID SYSTEM: No dissection, occlusion or aneurysm. There is calcified atherosclerosis extending into the proximal ICA, resulting in less than 50% stenosis. LEFT CAROTID SYSTEM: There is eccentric plaque at the left carotid bifurcation extending into the proximal left internal carotid artery. There is no hemodynamically significant stenosis. VERTEBRAL ARTERIES: Left dominant configuration. Both origins are clearly patent. There is no dissection, occlusion or flow-limiting stenosis to the skull base (V1-V3 segments). CTA HEAD FINDINGS POSTERIOR CIRCULATION: --Vertebral arteries: Normal V4 segments. --Inferior cerebellar arteries: Normal. --Basilar artery: Normal. --Superior cerebellar arteries: Normal. --Posterior cerebral arteries (PCA): Normal. ANTERIOR CIRCULATION: --Intracranial internal carotid arteries: Atherosclerotic calcification of the internal carotid arteries at the skull base without hemodynamically significant stenosis. --Anterior cerebral arteries (ACA): Normal. Both A1 segments are present. Patent anterior communicating artery (a-comm). --Middle cerebral arteries (MCA): Normal. VENOUS SINUSES: As permitted by contrast timing, patent. ANATOMIC VARIANTS: None Review of the MIP images confirms the above findings. IMPRESSION: 1. No emergent large vessel occlusion or high-grade stenosis of the intracranial arteries. 2. Eccentric plaque within the proximal left internal carotid artery may be at increased risk of embolus formation. 3.  Bilateral carotid bifurcation atherosclerosis without hemodynamically significant stenosis by NASCET criteria. 4. Enlarged heterogeneous right thyroid lobe. Recommend thyroid ultrasound on a nonemergent basis (ref: J Am Coll Radiol. 2015 Feb;12(2): 143-50). Aortic Atherosclerosis (ICD10-I70.0). Electronically Signed   By: Deatra Robinson M.D.   On: 09/12/2020 03:15   ECHOCARDIOGRAM COMPLETE  Result Date: 09/12/2020    ECHOCARDIOGRAM REPORT   Patient Name:   ALSON MCPHEETERS Date of Exam: 09/12/2020 Medical Rec #:  [id number]   Height:       70.0 in Accession #:    [id number]  Weight:       160.0 lb Date of Birth:  1941-07-05   BSA:          1.898 m Patient Age:    79 years    BP:           125/72 mmHg Patient Gender: M           HR:           60 bpm. Exam Location:  Inpatient Procedure: 2D Echo, Cardiac Doppler and Color Doppler Indications:    Stroke  History:        Patient has no prior history of Echocardiogram examinations.                 Pacemaker; Risk Factors:Hypertension. GERD.  Sonographer:    Ross Ludwig RDCS (AE) Referring Phys: [id number] VASUNDHRA RATHORE IMPRESSIONS  1. Left ventricular ejection fraction,  by estimation, is 55 to 60%. The left ventricle has normal function. The left ventricle has no regional wall motion abnormalities. There is mild concentric left ventricular hypertrophy. Left ventricular diastolic parameters are indeterminate.  2. Right ventricular systolic function is normal. The right ventricular size is normal. There is normal pulmonary artery systolic pressure. The estimated right ventricular systolic pressure is 21.8 mmHg.  3. The mitral valve is normal in structure. Trivial mitral valve regurgitation. No evidence of mitral stenosis.  4. The aortic valve is tricuspid. Aortic valve regurgitation is mild. Mild aortic valve sclerosis is present, with no evidence of aortic valve stenosis.  5. Aortic dilatation noted. There is mild dilatation of the aortic root, measuring 39 mm.  6. The  inferior vena cava is normal in size with greater than 50% respiratory variability, suggesting right atrial pressure of 3 mmHg. FINDINGS  Left Ventricle: Left ventricular ejection fraction, by estimation, is 55 to 60%. The left ventricle has normal function. The left ventricle has no regional wall motion abnormalities. The left ventricular internal cavity size was normal in size. There is  mild concentric left ventricular hypertrophy. Left ventricular diastolic parameters are indeterminate. Normal left ventricular filling pressure. Right Ventricle: The right ventricular size is normal. No increase in right ventricular wall thickness. Right ventricular systolic function is normal. There is normal pulmonary artery systolic pressure. The tricuspid regurgitant velocity is 2.17 m/s, and  with an assumed right atrial pressure of 3 mmHg, the estimated right ventricular systolic pressure is 21.8 mmHg. Left Atrium: Left atrial size was normal in size. Right Atrium: Right atrial size was normal in size. Pericardium: There is no evidence of pericardial effusion. Mitral Valve: The mitral valve is normal in structure. Trivial mitral valve regurgitation. No evidence of mitral valve stenosis. Tricuspid Valve: The tricuspid valve is normal in structure. Tricuspid valve regurgitation is trivial. No evidence of tricuspid stenosis. Aortic Valve: The aortic valve is tricuspid. Aortic valve regurgitation is mild. Aortic regurgitation PHT measures 458 msec. Mild aortic valve sclerosis is present, with no evidence of aortic valve stenosis. Aortic valve mean gradient measures 4.0 mmHg. Aortic valve peak gradient measures 6.2 mmHg. Aortic valve area, by VTI measures 2.27 cm. Pulmonic Valve: The pulmonic valve was normal in structure. Pulmonic valve regurgitation is trivial. No evidence of pulmonic stenosis. Aorta: Aortic dilatation noted. There is mild dilatation of the aortic root, measuring 39 mm. Venous: The inferior vena cava is normal  in size with greater than 50% respiratory variability, suggesting right atrial pressure of 3 mmHg. IAS/Shunts: No atrial level shunt detected by color flow Doppler.  LEFT VENTRICLE PLAX 2D LVIDd:         5.20 cm  Diastology LVIDs:         3.80 cm  LV e' medial:    5.77 cm/s LV PW:         1.20 cm  LV E/e' medial:  10.2 LV IVS:        1.30 cm  LV e' lateral:   10.70 cm/s LVOT diam:     2.10 cm  LV E/e' lateral: 5.5 LV SV:         65 LV SV Index:   34 LVOT Area:     3.46 cm  RIGHT VENTRICLE             IVC RV S prime:     15.20 cm/s  IVC diam: 1.30 cm TAPSE (M-mode): 2.4 cm LEFT ATRIUM  Index       RIGHT ATRIUM           Index LA diam:      4.10 cm 2.16 cm/m  RA Area:     21.50 cm LA Vol (A2C): 56.5 ml 29.76 ml/m RA Volume:   57.30 ml  30.18 ml/m LA Vol (A4C): 52.8 ml 27.81 ml/m  AORTIC VALVE AV Area (Vmax):    2.52 cm AV Area (Vmean):   2.08 cm AV Area (VTI):     2.27 cm AV Vmax:           124.00 cm/s AV Vmean:          93.100 cm/s AV VTI:            0.287 m AV Peak Grad:      6.2 mmHg AV Mean Grad:      4.0 mmHg LVOT Vmax:         90.30 cm/s LVOT Vmean:        56.000 cm/s LVOT VTI:          0.188 m LVOT/AV VTI ratio: 0.66 AI PHT:            458 msec  AORTA Ao Root diam: 3.90 cm Ao Asc diam:  3.60 cm MITRAL VALVE               TRICUSPID VALVE MV Area (PHT): 5.97 cm    TR Peak grad:   18.8 mmHg MV Decel Time: 127 msec    TR Vmax:        217.00 cm/s MV E velocity: 58.60 cm/s MV A velocity: 53.50 cm/s  SHUNTS MV E/A ratio:  1.10        Systemic VTI:  0.19 m                            Systemic Diam: 2.10 cm Armanda Magic MD Electronically signed by Armanda Magic MD Signature Date/Time: 09/12/2020/2:17:08 PM    Final       The results of significant diagnostics from this hospitalization (including imaging, microbiology, ancillary and laboratory) are listed below for reference.     Microbiology: Recent Results (from the past 240 hour(s))  Respiratory Panel by RT PCR (Flu A&B, Covid) -  Nasopharyngeal Swab     Status: None   Collection Time: 09/12/20 11:28 AM   Specimen: Nasopharyngeal Swab  Result Value Ref Range Status   SARS Coronavirus 2 by RT PCR NEGATIVE NEGATIVE Final    Comment: (NOTE) SARS-CoV-2 target nucleic acids are NOT DETECTED.  The SARS-CoV-2 RNA is generally detectable in upper respiratoy specimens during the acute phase of infection. The lowest concentration of SARS-CoV-2 viral copies this assay can detect is 131 copies/mL. A negative result does not preclude SARS-Cov-2 infection and should not be used as the sole basis for treatment or other patient management decisions. A negative result may occur with  improper specimen collection/handling, submission of specimen other than nasopharyngeal swab, presence of viral mutation(s) within the areas targeted by this assay, and inadequate number of viral copies (<131 copies/mL). A negative result must be combined with clinical observations, patient history, and epidemiological information. The expected result is Negative.  Fact Sheet for Patients:  https://www.moore.com/  Fact Sheet for Healthcare Providers:  https://www.young.biz/  This test is no t yet approved or cleared by the [place] FDA and  has been authorized for detection and/or diagnosis of SARS-CoV-2 by FDA under  an Emergency Use Authorization (EUA). This EUA will remain  in effect (meaning this test can be used) for the duration of the COVID-19 declaration under Section 564(b)(1) of the Act, 21 U.S.C. section 360bbb-3(b)(1), unless the authorization is terminated or revoked sooner.     Influenza A by PCR NEGATIVE NEGATIVE Final   Influenza B by PCR NEGATIVE NEGATIVE Final    Comment: (NOTE) The Xpert Xpress SARS-CoV-2/FLU/RSV assay is intended as an aid in  the diagnosis of influenza from Nasopharyngeal swab specimens and  should not be used as a sole basis for treatment. Nasal washings and   aspirates are unacceptable for Xpert Xpress SARS-CoV-2/FLU/RSV  testing.  Fact Sheet for Patients: https://www.moore.com/  Fact Sheet for Healthcare Providers: https://www.young.biz/  This test is not yet approved or cleared by the [place] FDA and  has been authorized for detection and/or diagnosis of SARS-CoV-2 by  FDA under an Emergency Use Authorization (EUA). This EUA will remain  in effect (meaning this test can be used) for the duration of the  Covid-19 declaration under Section 564(b)(1) of the Act, 21  U.S.C. section 360bbb-3(b)(1), unless the authorization is  terminated or revoked. Performed at [facility], [address] [place], [place] [address]      Labs: BNP (last 3 results) No results for input(s): BNP in the last 8760 hours. Basic Metabolic Panel: Recent Labs  Lab 09/11/20 2237 09/11/20 2243 09/13/20 0629 09/14/20 0129  NA 135 139 138 138  K 4.0 4.0 4.1 4.2  CL 105 106 107 105  CO2 23  --  24 25  GLUCOSE 114* 112* 96 108*  BUN 30* 29* 21 21  CREATININE 1.21 1.20 1.05 1.02  CALCIUM 8.5*  --  8.8* 9.1  MG  --   --  2.0 1.9   Liver Function Tests: Recent Labs  Lab 09/11/20 2237  AST 24  ALT 23  ALKPHOS 86  BILITOT 0.9  PROT 5.7*  ALBUMIN 3.4*   No results for input(s): LIPASE, AMYLASE in the last 168 hours. No results for input(s): AMMONIA in the last 168 hours. CBC: Recent Labs  Lab 09/11/20 2237 09/11/20 2243 09/13/20 0629 09/14/20 0129  WBC 4.1  --  4.2 4.1  NEUTROABS 2.0  --   --   --   HGB 10.9* 9.9* 11.6* 12.0*  HCT 31.5* 29.0* 33.7* 34.5*  MCV 98.4  --  95.7 95.3  PLT 156  --  148* 161   Cardiac Enzymes: No results for input(s): CKTOTAL, CKMB, CKMBINDEX, TROPONINI in the last 168 hours. BNP: Invalid input(s): POCBNP CBG: Recent Labs  Lab 09/11/20 2150  GLUCAP 106*   D-Dimer No results for input(s): DDIMER in the last 72 hours. Hgb A1c Recent Labs     09/12/20 0500  HGBA1C 5.4   Lipid Profile Recent Labs    09/12/20 0500  CHOL 110  HDL 36*  LDLCALC 58  TRIG 80  CHOLHDL 3.1   Thyroid function studies Recent Labs    09/13/20 0626  TSH 1.066   Anemia work up Recent Labs    09/12/20 0425 09/13/20 0626  VITAMINB12  --  240  FOLATE  --  28.1  FERRITIN  --  45  TIBC  --  329  IRON  --  75  RETICCTPCT 0.9  --    Urinalysis    Component Value Date/Time   COLORURINE STRAW (A) 09/12/2020 0052   APPEARANCEUR CLEAR 09/12/2020 0052   LABSPEC 1.017 09/12/2020 0052  PHURINE 7.0 09/12/2020 0052   GLUCOSEU NEGATIVE 09/12/2020 0052   HGBUR NEGATIVE 09/12/2020 0052   BILIRUBINUR NEGATIVE 09/12/2020 0052   KETONESUR NEGATIVE 09/12/2020 0052   PROTEINUR NEGATIVE 09/12/2020 0052   NITRITE NEGATIVE 09/12/2020 0052   LEUKOCYTESUR NEGATIVE 09/12/2020 0052   Sepsis Labs Invalid input(s): PROCALCITONIN,  WBC,  LACTICIDVEN Microbiology Recent Results (from the past 240 hour(s))  Respiratory Panel by RT PCR (Flu A&B, Covid) - Nasopharyngeal Swab     Status: None   Collection Time: 09/12/20 11:28 AM   Specimen: Nasopharyngeal Swab  Result Value Ref Range Status   SARS Coronavirus 2 by RT PCR NEGATIVE NEGATIVE Final    Comment: (NOTE) SARS-CoV-2 target nucleic acids are NOT DETECTED.  The SARS-CoV-2 RNA is generally detectable in upper respiratoy specimens during the acute phase of infection. The lowest concentration of SARS-CoV-2 viral copies this assay can detect is 131 copies/mL. A negative result does not preclude SARS-Cov-2 infection and should not be used as the sole basis for treatment or other patient management decisions. A negative result may occur with  improper specimen collection/handling, submission of specimen other than nasopharyngeal swab, presence of viral mutation(s) within the areas targeted by this assay, and inadequate number of viral copies (<131 copies/mL). A negative result must be combined with  clinical observations, patient history, and epidemiological information. The expected result is Negative.  Fact Sheet for Patients:  https://www.moore.com/https://www.fda.gov/media/142436/download  Fact Sheet for Healthcare Providers:  https://www.young.biz/https://www.fda.gov/media/142435/download  This test is no t yet approved or cleared by the [place] FDA and  has been authorized for detection and/or diagnosis of SARS-CoV-2 by FDA under an Emergency Use Authorization (EUA). This EUA will remain  in effect (meaning this test can be used) for the duration of the COVID-19 declaration under Section 564(b)(1) of the Act, 21 U.S.C. section 360bbb-3(b)(1), unless the authorization is terminated or revoked sooner.     Influenza A by PCR NEGATIVE NEGATIVE Final   Influenza B by PCR NEGATIVE NEGATIVE Final    Comment: (NOTE) The Xpert Xpress SARS-CoV-2/FLU/RSV assay is intended as an aid in  the diagnosis of influenza from Nasopharyngeal swab specimens and  should not be used as a sole basis for treatment. Nasal washings and  aspirates are unacceptable for Xpert Xpress SARS-CoV-2/FLU/RSV  testing.  Fact Sheet for Patients: https://www.moore.com/https://www.fda.gov/media/142436/download  Fact Sheet for Healthcare Providers: https://www.young.biz/https://www.fda.gov/media/142435/download  This test is not yet approved or cleared by the [place] FDA and  has been authorized for detection and/or diagnosis of SARS-CoV-2 by  FDA under an Emergency Use Authorization (EUA). This EUA will remain  in effect (meaning this test can be used) for the duration of the  Covid-19 declaration under Section 564(b)(1) of the Act, 21  U.S.C. section 360bbb-3(b)(1), unless the authorization is  terminated or revoked. Performed at [facility], [address] [place], [place] [address]      Time coordinating discharge:  I have spent 35 minutes face to face with the patient and on the ward discussing the patients care, assessment, plan and disposition with other care  givers. >50% of the time was devoted counseling the patient about the risks and benefits of treatment/Discharge disposition and coordinating care.   SIGNED:   Dimple NanasAnkit Chirag Lisle Skillman, MD  [facility] 09/14/2020, 11:43 AM   If 7PM-7AM, please contact night-coverage

## 2020-09-14 NOTE — Care Management (Signed)
Eliquis 5mg  PO BID benefit check submitted.  First prescription sent to [facility]. [facility] Pharm[facility] will use 30 day free card and bring 30 day supply to patient's room.   [facility] RN

## 2020-09-14 NOTE — ED Provider Notes (Signed)
[facility] EMERGENCY DEPARTMENT Provider Note   CSN: [id number] Arrival date & time: 09/14/20  1352     History No chief complaint on file.   [name] is a 79 y.o. male.  He was just discharged earlier today after being admitted for a TIA that involved his speech.  He said acutely at 12:50 PM he began having difficulty with word finding.  EMS states that during transport his word finding difficulties resolved.  The history is provided by the patient and the EMS personnel.  Cerebrovascular Accident This is a recurrent problem. The current episode started 1 to 2 hours ago. The problem has been resolved. Pertinent negatives include no chest pain, no abdominal pain, no headaches and no shortness of breath. Nothing aggravates the symptoms. Nothing relieves the symptoms. He has tried nothing for the symptoms. The treatment provided no relief.       Past Medical History:  Diagnosis Date  . GERD (gastroesophageal reflux disease)   . Hypertension     Patient Active Problem List   Diagnosis Date Noted  . 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 Essential hypertension 09/12/2020  . Anemia 09/12/2020  . Aphasia 09/12/2020  . TIA (transient ischemic attack) 09/12/2020    Past Surgical History:  Procedure Laterality Date  . HERNIA REPAIR    . PACEMAKER IMPLANT    . TONSILLECTOMY         No family history on file.  Social History   Tobacco Use  . Smoking status: Not on file  Substance Use Topics  . Alcohol use: Yes  . Drug use: Not on file    Home Medications Prior to Admission medications   Medication Sig Start Date End Date Taking? Authorizing Provider  albuterol (VENTOLIN HFA) 108 (90 Base) MCG/ACT inhaler Inhale 2 puffs into the lungs 4 (four) times daily as needed for wheezing or shortness of breath.    [provider]  apixaban (ELIQUIS) 5 MG TABS tablet Take 1 tablet (5 mg total) by mouth 2 (two) times daily. 09/14/20    Amin, Loura Halt, MD  cholecalciferol (VITAMIN D) 25 MCG (1000 UNIT) tablet Take 1,000 Units by mouth at bedtime.    [provider]  Fluticasone Furoate (ARNUITY ELLIPTA) 200 MCG/ACT AEPB Inhale 1 puff into the lungs every morning.    [provider]  Magnesium 250 MG TABS Take 250 mg by mouth every morning.    [provider]  metoprolol tartrate (LOPRESSOR) 50 MG tablet Take 25 mg by mouth daily.    [provider]  Multiple Vitamin (MULTIVITAMIN WITH MINERALS) TABS tablet Take 1 tablet by mouth every morning.    [provider]  Omega-3 Fatty Acids (FISH OIL PO) Take 1 capsule by mouth every morning.    [provider]  omeprazole (PRILOSEC) 40 MG capsule Take 40 mg by mouth daily before supper.    [provider]  pravastatin (PRAVACHOL) 40 MG tablet Take 40 mg by mouth at bedtime.    [provider]    Allergies    Patient has no known allergies.  Review of Systems   Review of Systems  Constitutional: Negative for fever.  HENT: Negative for sore throat.   Eyes: Negative for visual disturbance.  Respiratory: Negative for shortness of breath.   Cardiovascular: Negative for chest pain.  Gastrointestinal: Negative for abdominal pain.  Genitourinary: Negative for dysuria.  Musculoskeletal: Negative for neck pain.  Skin: Negative for rash.  Neurological: Positive  for speech difficulty. Negative for headaches.    Physical Exam Updated Vital Signs BP 117/64   Pulse (!) 59   Temp 98.2 F (36.8 C) (Oral)   Resp 20   Ht 5\' 10"  (1.778 m)   Wt 72.6 kg Comment: Actual weight not obtained per neurology d/t pt not tPa candidate  SpO2 98%   BMI 22.96 kg/m   Physical Exam Vitals and nursing note reviewed.  Constitutional:      Appearance: Normal appearance. He is well-developed.  HENT:     Head: Normocephalic and atraumatic.  Eyes:     Conjunctiva/sclera: Conjunctivae normal.  Cardiovascular:     Rate and  Rhythm: Normal rate and regular rhythm.     Heart sounds: No murmur heard.   Pulmonary:     Effort: Pulmonary effort is normal. No respiratory distress.     Breath sounds: Normal breath sounds.  Abdominal:     Palpations: Abdomen is soft.     Tenderness: There is no abdominal tenderness.  Musculoskeletal:        General: No deformity or signs of injury. Normal range of motion.     Cervical back: Neck supple.  Skin:    General: Skin is warm and dry.     Capillary Refill: Capillary refill takes less than 2 seconds.  Neurological:     General: No focal deficit present.     Mental Status: He is alert.     Cranial Nerves: No cranial nerve deficit.     Sensory: No sensory deficit.     Motor: No weakness.     ED Results / Procedures / Treatments   Labs (all labs ordered are listed, but only abnormal results are displayed) Labs Reviewed  APTT - Abnormal; Notable for the following components:      Result Value   aPTT 41 (*)    All other components within normal limits  CBC - Abnormal; Notable for the following components:   RBC 3.86 (*)    HCT 38.0 (*)    All other components within normal limits  COMPREHENSIVE METABOLIC PANEL - Abnormal; Notable for the following components:   BUN 24 (*)    All other components within normal limits  I-STAT CHEM 8, ED - Abnormal; Notable for the following components:   BUN 25 (*)    Calcium, Ion 1.13 (*)    Hemoglobin 12.2 (*)    HCT 36.0 (*)    All other components within normal limits  PROTIME-INR  DIFFERENTIAL  CBG MONITORING, ED    EKG EKG Interpretation  Date/Time:  Tuesday September 14 2020 14:32:57 EDT Ventricular Rate:  63 PR Interval:    QRS Duration: 103 QT Interval:  412 QTC Calculation: 422 R Axis:   7 Text Interpretation: Atrial-paced rhythm no acute ST or T wave changes Confirmed by Meridee ScoreButler, Quaran Kedzierski [phone]([id number]) on 09/14/2020 2:35:52 PM   Radiology CT Code Stroke CTA Head W/WO contrast  Result Date: 09/14/2020 CLINICAL  DATA:  Aphasia EXAM: CT ANGIOGRAPHY HEAD AND NECK TECHNIQUE: Multidetector CT imaging of the head and neck was performed using the standard protocol during bolus administration of intravenous contrast. Multiplanar CT image reconstructions and MIPs were obtained to evaluate the vascular anatomy. Carotid stenosis measurements (when applicable) are obtained utilizing NASCET criteria, using the distal internal carotid diameter as the denominator. CONTRAST:  75mL OMNIPAQUE IOHEXOL 350 MG/ML SOLN COMPARISON:  09/12/2020 FINDINGS: CTA NECK Aortic arch: Great vessel origins are patent. Right carotid system: Patent. Calcified plaque at the  ICA origin with less than 50% stenosis. Left carotid system: Patent. Calcified and irregular fibrofatty plaque with possible ulceration causing less than 50% stenosis. Vertebral arteries: Patent.  Left vertebral artery is dominant. Skeleton: Stable advanced degenerative changes of the cervical spine. Other neck: No new findings.  Right thyroid as previously described. Upper chest: No apical lung mass. Review of the MIP images confirms the above findings CTA HEAD Anterior circulation: Intracranial internal carotid arteries are patent with mild calcified plaque. Anterior cerebral arteries are patent with anterior communicating artery present. Left A1 ACA is dominant. Middle cerebral arteries are patent. Posterior circulation: Intracranial vertebral arteries are patent with mild calcified plaque on the left. Patent PICA origins. Basilar artery is patent. Superior cerebellar artery origins are patent. Posterior cerebral arteries are patent. Small left posterior communicating artery is present. Venous sinuses: Patent as allowed by contrast bolus timing. Review of the MIP images confirms the above findings IMPRESSION: No significant change since 09/12/2020. No new large vessel occlusion or hemodynamically significant stenosis. 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 Electronically Signed   By: Guadlupe Spanish M.D.   On: 09/14/2020 16:08   CT HEAD WO CONTRAST  Result Date: 09/13/2020 CLINICAL DATA:  Follow-up TIA EXAM: CT HEAD WITHOUT CONTRAST TECHNIQUE: Contiguous axial images were obtained from the base of the skull through the vertex without intravenous contrast. COMPARISON:  CT a of the head neck from yesterday FINDINGS: Brain: No evidence of acute infarction, hemorrhage, hydrocephalus, extra-axial collection or mass lesion/mass effect. Generalized atrophy and confluent chronic small vessel ischemic low-density in the white matter, especially about the frontal horns. Vascular: No hyperdense vessel or unexpected calcification. Skull: Normal. Negative for fracture or focal lesion. Sinuses/Orbits: Negative IMPRESSION: No hemorrhage or visible infarct. Aging brain. Electronically Signed   By: Marnee Spring M.D.   On: 09/13/2020 05:42   CT Code Stroke CTA Neck W/WO contrast  Result Date: 09/14/2020 CLINICAL DATA:  Aphasia EXAM: CT ANGIOGRAPHY HEAD AND NECK TECHNIQUE: Multidetector CT imaging of the head and neck was performed using the standard protocol during bolus administration of intravenous contrast. Multiplanar CT image reconstructions and MIPs were obtained to evaluate the vascular anatomy. Carotid stenosis measurements (when applicable) are obtained utilizing NASCET criteria, using the distal internal carotid diameter as the denominator. CONTRAST:  58mL OMNIPAQUE IOHEXOL 350 MG/ML SOLN COMPARISON:  09/12/2020 FINDINGS: CTA NECK Aortic arch: Great vessel origins are patent. Right carotid system: Patent. Calcified plaque at the ICA origin with less than 50% stenosis. Left carotid system: Patent. Calcified and irregular fibrofatty plaque with possible ulceration causing less than 50% stenosis. Vertebral arteries: Patent.  Left vertebral artery is dominant. Skeleton: Stable advanced degenerative changes of the cervical spine. Other  neck: No new findings.  Right thyroid as previously described. Upper chest: No apical lung mass. Review of the MIP images confirms the above findings CTA HEAD Anterior circulation: Intracranial internal carotid arteries are patent with mild calcified plaque. Anterior cerebral arteries are patent with anterior communicating artery present. Left A1 ACA is dominant. Middle cerebral arteries are patent. Posterior circulation: Intracranial vertebral arteries are patent with mild calcified plaque on the left. Patent PICA origins. Basilar artery is patent. Superior cerebellar artery origins are patent. Posterior cerebral arteries are patent. Small left posterior communicating artery is present. Venous sinuses: Patent as allowed by contrast bolus timing. Review of the MIP images confirms the above findings IMPRESSION: No significant change since 09/12/2020. No new large vessel  occlusion or hemodynamically significant stenosis. 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 Electronically Signed   By: Guadlupe Spanish M.D.   On: 09/14/2020 16:08   CT HEAD CODE STROKE WO CONTRAST  Result Date: 09/14/2020 CLINICAL DATA:  Code stroke.  Follow-up stroke presentation. EXAM: CT HEAD WITHOUT CONTRAST TECHNIQUE: Contiguous axial images were obtained from the base of the skull through the vertex without intravenous contrast. COMPARISON:  CT studies 09/12/2020 and 09/14/2019. FINDINGS: Brain: Age related atrophy. Chronic small-vessel ischemic changes of the cerebral hemispheric white matter. No sign of acute infarction, mass lesion, hemorrhage, hydrocephalus or extra-axial collection. Vascular: There is atherosclerotic calcification of the major vessels at the base of the brain. Skull: Negative Sinuses/Orbits: Clear/normal Other: None ASPECTS (Alberta Stroke Program Early CT Score) - Ganglionic level infarction (caudate, lentiform nuclei, internal capsule, insula, M1-M3 cortex): 7  - Supraganglionic infarction (M4-M6 cortex): 3 Total score (0-10 with 10 being normal): 10 IMPRESSION: 1. No acute finding by CT. Age related atrophy and chronic small-vessel ischemic changes of the white matter. 2. ASPECTS is 10. Electronically Signed   By: Paulina Fusi M.D.   On: 09/14/2020 14:05    Procedures Procedures (including critical care time)  Medications Ordered in ED Medications  sodium chloride flush (NS) 0.9 % injection 3 mL (has no administration in time range)    ED Course  I have reviewed the triage vital signs and the nursing notes.  Pertinent labs & imaging results that were available during my care of the patient were reviewed by me and considered in my medical decision making (see chart for details).  Clinical Course as of Sep 14 1852  Tue Sep 14, 2020  1441 Discussed case with neurology Dr. Iver Nestle.  He did not get his MRI during last admission because he is a pacemaker.  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  She is going to confer with one of the other neurologist if he would be a vascular candidate.  Still unclear if he needs readmission if there is no intervention to be done as he has had a full work-up already.   [MB]  1452 Neurology got back to me and they said the recommendation is going to be for the patient to be readmitted he will need another CTA to make sure the plaque is stable ,EEG.   [MB]    Clinical Course User Index [MB] Terrilee Files, MD   MDM Rules/Calculators/A&P                         This patient complains of acute word finding difficulties that started just about an hour prior to arrival; this involves an extensive number of treatment Options and is a complaint that carries with it a high risk of complications and Morbidity. The differential includes stroke, TIA, metabolic derangement, arrhythmia, hypoglycemia  I ordered,  reviewed and interpreted labs, which included CBC with a normal white count stable hemoglobin, chemistries fairly normal, LFTs normal, glucose normal I ordered imaging studies which included CT head CTA angio head and neck and I independently    visualized and interpreted imaging which showed no significant change from prior imaging Additional history obtained from EMS Previous records obtained and reviewed in epic including recent admission and discharge for similar I consulted neurology Dr. Iver Nestle  and discussed lab and imaging findings  Critical Interventions: None  After the interventions stated above, I reevaluated the patient and found patient to be currently asymptomatic.  Per neuro recommendations he is going to be admitted to the hospital for serial neuro eval and possible seizure work-up discussed with [facility] Dr. Sedalia Muta who will evaluate the patient for admission.  Patient agreeable to plan.   Final Clinical Impression(s) / ED Diagnoses Final diagnoses:  TIA (transient ischemic attack)    Rx / DC Orders ED Discharge Orders    None       Terrilee Files, MD 09/14/20 1857

## 2020-09-14 NOTE — ED Notes (Signed)
Pt transported to CT ?

## 2020-09-14 NOTE — Consult Note (Signed)
Neurology Consultation Reason for Consult: Code stroke for aphasia Referring Physician:   CC: Transient aphasia  History is obtained from: Patient, wife and chart review  HPI: [name] is a 79 y.o. male with a past medical history significant for hypertension, paroxysmal atrial fibrillation started on anticoagulation today, left internal carotid artery eccentric plaque, cardiac arrhythmia status post pacemaker.  He originally presented on Saturday afternoon with episode of aphasia at home.  He had a TIA work-up and was started on Eliquis.  On his return home he took a shower and when he was leaving the shower felt "dizzy."  He sat down on the bed and was unable to respond to his wife's questions although he retains awareness and understanding of her questions for 5 to 10 minutes.  EMS was activated, while awaiting their arrival his symptoms resolved.  However upon ambulation from his bed to the EMS stretcher once in the ambulance he again had an episode of aphasia which again had largely resolved by the time of my evaluation in [facility] ED  On review of systems he denies any other new symptoms since his discharge.  He additionally denies any prior episodes of seizure activity or any similar spells of aphasia prior to Saturday.  LKW: 12:50 PM  tPA given?: No, due to symptoms rapidly resolving Premorbid modified rankin scale:     0 - No symptoms.  ROS: A 14 point ROS was performed and is negative except as noted in the HPI.   Past Medical History:  Diagnosis Date  . GERD (gastroesophageal reflux disease)   . Hypertension    Past Surgical History:  Procedure Laterality Date  . HERNIA REPAIR    . PACEMAKER IMPLANT    . TONSILLECTOMY     He does have a family history of strokes  Social History:  reports current alcohol use. No history on file for tobacco use and drug use.   Exam: Current vital signs: BP 119/65   Pulse 61   Temp 98.2 F (36.8 C) (Oral)   Resp 20   Ht 5\' 10"   (1.778 m)   Wt 72.6 kg Comment: Actual weight not obtained per neurology d/t pt not tPa candidate  SpO2 100%   BMI 22.96 kg/m  Vital signs in last 24 hours: Temp:  [97.5 F (36.4 C)-98.4 F (36.9 C)] 98.2 F (36.8 C) (11/02 1424) Pulse Rate:  [60-67] 61 (11/02 1445) Resp:  [12-47] 20 (11/02 1445) BP: (119-147)/(64-80) 119/65 (11/02 1445) SpO2:  [97 %-100 %] 100 % (11/02 1445) Weight:  [72.6 kg] 72.6 kg (11/02 1428)   Physical Exam  Constitutional: Appears well-developed and well-nourished.  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  Respiratory: Effort normal, non-labored breathing GI: Soft.  No distension. There is no tenderness.  Skin: WDI  Neuro: Mental Status: Patient is awake, alert, oriented to person, place, month, year, and situation. Patient is able to give a clear and coherent history. On initial evaluation was making some mild errors such as "I feel you telephone me" instead of I feel you touching me, but this also quickly resolved Cranial Nerves: II: Visual Fields are full. Pupils are equal, round, and reactive to light.   III,IV, VI: EOMI with mild bilateral ptosis left greater than right baseline per wife and patient V: Facial sensation is symmetric to temperature VII: Facial movement is symmetric.  VIII: hearing is intact  to voice X: Uvula elevates symmetrically XI: Shoulder shrug is symmetric. XII: tongue is midline without atrophy or fasciculations.  Motor: Tone is normal. Bulk is normal. 5/5 strength was present in all four extremities, mildly pain limited in the shoulder Sensory: Sensation is symmetric to light touch and temperature in the arms and legs. Deep Tendon Reflexes: 2+ and symmetric in the biceps and patellae.  Plantars: Toes are downgoing bilaterally.  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 Considered the possibility of presyncope; given orthostatics are negative this seems unlikely. Will evaluate for the possibility of seizures with EEG and repeat CTA head and neck to determine if the clot previously seen has changed enough to meet criteria for intervention (unlikely but prudent in the setting of recurrent symptoms x 2 episodes today).   Recommendations: - Orthostatic vitals completed, negative  - EEG - CTA head and neck to evaluate for L ICA clot stability   - Should there be a significant interval development of stenosis, will discuss w/ vascular surgery - Observation overnight  - Stroke team will follow  Brooke Dare MD-PhD [facility] [phone]

## 2020-09-15 DIAGNOSIS — R4182 Altered mental status, unspecified: Secondary | ICD-10-CM | POA: Diagnosis not present

## 2020-09-15 DIAGNOSIS — R569 Unspecified convulsions: Secondary | ICD-10-CM | POA: Diagnosis present

## 2020-09-15 DIAGNOSIS — Z79899 Other long term (current) drug therapy: Secondary | ICD-10-CM | POA: Diagnosis not present

## 2020-09-15 DIAGNOSIS — I482 Chronic atrial fibrillation, unspecified: Secondary | ICD-10-CM

## 2020-09-15 DIAGNOSIS — Z20822 Contact with and (suspected) exposure to covid-19: Secondary | ICD-10-CM | POA: Diagnosis present

## 2020-09-15 DIAGNOSIS — G459 Transient cerebral ischemic attack, unspecified: Secondary | ICD-10-CM | POA: Diagnosis present

## 2020-09-15 DIAGNOSIS — Z7901 Long term (current) use of anticoagulants: Secondary | ICD-10-CM | POA: Diagnosis not present

## 2020-09-15 DIAGNOSIS — I700 Atherosclerosis of aorta: Secondary | ICD-10-CM | POA: Diagnosis present

## 2020-09-15 DIAGNOSIS — Z950 Presence of cardiac pacemaker: Secondary | ICD-10-CM | POA: Diagnosis not present

## 2020-09-15 DIAGNOSIS — R4701 Aphasia: Secondary | ICD-10-CM | POA: Diagnosis present

## 2020-09-15 DIAGNOSIS — R001 Bradycardia, unspecified: Secondary | ICD-10-CM | POA: Diagnosis present

## 2020-09-15 DIAGNOSIS — Z823 Family history of stroke: Secondary | ICD-10-CM | POA: Diagnosis not present

## 2020-09-15 DIAGNOSIS — E785 Hyperlipidemia, unspecified: Secondary | ICD-10-CM | POA: Diagnosis present

## 2020-09-15 DIAGNOSIS — K219 Gastro-esophageal reflux disease without esophagitis: Secondary | ICD-10-CM | POA: Diagnosis present

## 2020-09-15 DIAGNOSIS — I10 Essential (primary) hypertension: Secondary | ICD-10-CM | POA: Diagnosis present

## 2020-09-15 DIAGNOSIS — I6522 Occlusion and stenosis of left carotid artery: Secondary | ICD-10-CM

## 2020-09-15 DIAGNOSIS — I480 Paroxysmal atrial fibrillation: Secondary | ICD-10-CM | POA: Diagnosis present

## 2020-09-15 LAB — GLUCOSE, CAPILLARY: Glucose-Capillary: 103 mg/dL — ABNORMAL HIGH (ref 70–99)

## 2020-09-15 MED ORDER — HEPARIN (PORCINE) 25000 UT/250ML-% IV SOLN
1000.0000 [IU]/h | INTRAVENOUS | Status: DC
  Administered 2020-09-16: 1050 [IU]/h via INTRAVENOUS
  Filled 2020-09-15: qty 250

## 2020-09-15 MED ORDER — DOCUSATE SODIUM 100 MG PO CAPS
100.0000 mg | ORAL_CAPSULE | Freq: Two times a day (BID) | ORAL | Status: DC | PRN
  Administered 2020-09-15: 100 mg via ORAL
  Filled 2020-09-15: qty 1

## 2020-09-15 MED ORDER — APIXABAN 5 MG PO TABS
5.0000 mg | ORAL_TABLET | Freq: Two times a day (BID) | ORAL | Status: DC
  Administered 2020-09-15: 5 mg via ORAL
  Filled 2020-09-15: qty 1

## 2020-09-15 MED ORDER — ATORVASTATIN CALCIUM 40 MG PO TABS
40.0000 mg | ORAL_TABLET | Freq: Every day | ORAL | Status: DC
  Administered 2020-09-15 – 2020-09-17 (×2): 40 mg via ORAL
  Filled 2020-09-15 (×3): qty 1

## 2020-09-15 NOTE — Consult Note (Addendum)
Hospital Consult    Reason for Consult:  TIA, carotid stenosis Requesting Physician:  MRN #:  [id number]  History of Present Illness: This is a 79 y.o. male who moved to [place] just last week with his wife. He has past medical history of hypertension and pacemaker for atrial fibrillation who presented on Saturday with an episode of aphasia while at home eating dinner. He reports he started to not feel well and then was unable to find his words. His wife says that his syllables were all switched and was "jumbling his words". She immediately called 911. By the time EMS arrived his symptoms had resolved within 30 minutes. He had a full workup for TIA/ Stroke while admitted.CTA head and neck were negative for any high-grade stenosis or large vessel occlusion but showed left ICA irregular plaque. Echo showed EF 60%. There was concerns of A. fib. He was initiated on Eliquis per Neurology recommendation and was felt stable for discharge home.  He was subsequently discharged yesterday, 09/14/20 around noon and reports that he had gone home and had just taken a shower and told his wife that he felt dizzy so he sat down on the bed and had recurrence of his symptoms. His wife reports that he sort of sat there with a blank stare and unable to speak for about 3-4 minutes. His wife called EMS and by time they arrived his symptoms had resolved. They think the time frame was again about 30 min - 1 hour.   He presently feels back to his baseline. He does not report any amaurosis fugax, facial drooping, upper or lower extremity weakness or numbness.  Repeat CTA continues to show same irregular mixed plaque in the left internal carotid artery otherwise no large vessel occlusion or high grade stenosis of the intracranial arteries.   Vascular surgery has been consulted to evaluate patients carotid stenosis  Past Medical History:  Diagnosis Date  . GERD (gastroesophageal reflux disease)   . Hypertension      Past Surgical History:  Procedure Laterality Date  . HERNIA REPAIR    . PACEMAKER IMPLANT    . TONSILLECTOMY      No Known Allergies  Prior to Admission medications   Medication Sig Start Date End Date Taking? Authorizing Provider  albuterol (VENTOLIN HFA) 108 (90 Base) MCG/ACT inhaler Inhale 2 puffs into the lungs 4 (four) times daily as needed for wheezing or shortness of breath.   Yes [provider]  apixaban (ELIQUIS) 5 MG TABS tablet Take 1 tablet (5 mg total) by mouth 2 (two) times daily. 09/14/20  Yes Amin, Loura Halt, MD  cholecalciferol (VITAMIN D) 25 MCG (1000 UNIT) tablet Take 1,000 Units by mouth at bedtime.   Yes [provider]  Fluticasone Furoate (ARNUITY ELLIPTA) 200 MCG/ACT AEPB Inhale 1 puff into the lungs every morning.   Yes [provider]  Magnesium 250 MG TABS Take 250 mg by mouth every morning.   Yes [provider]  metoprolol tartrate (LOPRESSOR) 50 MG tablet Take 25 mg by mouth daily.   Yes [provider]  Multiple Vitamin (MULTIVITAMIN WITH MINERALS) TABS tablet Take 1 tablet by mouth every morning.   Yes [provider]  Omega-3 Fatty Acids (FISH OIL PO) Take 1 capsule by mouth every morning.   Yes [provider]  omeprazole (PRILOSEC) 40 MG capsule Take 40 mg by mouth daily before supper.   Yes [provider]  pravastatin (PRAVACHOL) 40 MG tablet Take 40 mg  by mouth at bedtime.   Yes [provider]    Social History   Socioeconomic History  . Marital status: Married    Spouse name: Not on file  . Number of children: Not on file  . Years of education: Not on file  . Highest education level: Not on file  Occupational History  . Not on file  Tobacco Use  . Smoking status: Not on file  Substance and Sexual Activity  . Alcohol use: Yes  . Drug use: Not on file  . Sexual activity: Not on file  Other Topics Concern  . Not on file  Social History Narrative  .  Not on file   Social Determinants of Health   Financial Resource Strain:   . Difficulty of Paying Living Expenses: Not on file  Food Insecurity:   . Worried About Programme researcher, broadcasting/film/video in the Last Year: Not on file  . Ran Out of Food in the Last Year: Not on file  Transportation Needs:   . Lack of Transportation (Medical): Not on file  . Lack of Transportation (Non-Medical): Not on file  Physical Activity:   . Days of Exercise per Week: Not on file  . Minutes of Exercise per Session: Not on file  Stress:   . Feeling of Stress : Not on file  Social Connections:   . Frequency of Communication with Friends and Family: Not on file  . Frequency of Social Gatherings with Friends and Family: Not on file  . Attends Religious Services: Not on file  . Active Member of Clubs or Organizations: Not on file  . Attends Banker Meetings: Not on file  . Marital Status: Not on file  Intimate Partner Violence:   . Fear of Current or Ex-Partner: Not on file  . Emotionally Abused: Not on file  . Physically Abused: Not on file  . Sexually Abused: Not on file     History reviewed. No pertinent family history.  ROS: Otherwise negative unless mentioned in HPI  Physical Examination  Vitals:   09/15/20 1300 09/15/20 1330  BP: 107/86 131/71  Pulse: 65 62  Resp: 16 15  Temp:    SpO2: 97% 99%   Body mass index is 22.96 kg/m.  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   CBC    Component Value Date/Time   WBC 5.3 09/14/2020 1355   RBC 3.86 (L) 09/14/2020 1355   HGB 12.2 (L) 09/14/2020 1408    HCT 36.0 (L) 09/14/2020 1408   PLT 167 09/14/2020 1355   MCV 98.4 09/14/2020 1355   MCH 33.9 09/14/2020 1355   MCHC 34.5 09/14/2020 1355   RDW 13.3 09/14/2020 1355   LYMPHSABS 2.2 09/14/2020 1355   MONOABS 0.5 09/14/2020 1355   EOSABS 0.0 09/14/2020 1355   BASOSABS 0.0 09/14/2020 1355    BMET    Component Value Date/Time   NA 137 09/14/2020 1408   K 4.1 09/14/2020 1408   CL 103 09/14/2020 1408   CO2 24 09/14/2020 1355   GLUCOSE 81 09/14/2020 1408   BUN 25 (H) 09/14/2020 1408   CREATININE 1.10 09/14/2020 1408   CALCIUM 9.2  09/14/2020 1355   GFRNONAA >60 09/14/2020 1355    COAGS: Lab Results  Component Value Date   INR 1.2 09/14/2020   INR 1.1 09/11/2020     Non-Invasive Vascular Imaging:   CT ANGIOGRAPHY HEAD AND NECK  TECHNIQUE: Multidetector CT imaging of the head and neck was performed using the standard protocol during bolus administration of intravenous contrast. Multiplanar CT image reconstructions and MIPs were obtained to evaluate the vascular anatomy. Carotid stenosis measurements (when applicable) are obtained utilizing NASCET criteria, using the distal internal carotid diameter as the denominator.  CONTRAST:  68mL OMNIPAQUE IOHEXOL 350 MG/ML SOLN  COMPARISON:  09/12/2020  FINDINGS: CTA NECK  Aortic arch: Great vessel origins are patent.  Right carotid system: Patent. Calcified plaque at the ICA origin with less than 50% stenosis.  Left carotid system: Patent. Calcified and irregular fibrofatty plaque with possible ulceration causing less than 50% stenosis.  Vertebral arteries: Patent.  Left vertebral artery is dominant.  Skeleton: Stable advanced degenerative changes of the cervical spine.  Other neck: No new findings.  Right thyroid as previously described.  Upper chest: No apical lung mass.  Review of the MIP images confirms the above findings  CTA HEAD  Anterior circulation: Intracranial internal carotid arteries  are patent with mild calcified plaque. Anterior cerebral arteries are patent with anterior communicating artery present. Left A1 ACA is dominant. Middle cerebral arteries are patent.  Posterior circulation: Intracranial vertebral arteries are patent with mild calcified plaque on the left. Patent PICA origins. Basilar artery is patent. Superior cerebellar artery origins are patent. Posterior cerebral arteries are patent. Small left posterior communicating artery is present.  Venous sinuses: Patent as allowed by contrast bolus timing.  Review of the MIP images confirms the above findings  IMPRESSION: No significant change since 09/12/2020. No new large vessel occlusion or hemodynamically significant stenosis. 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  Statin:  Yes.   Beta Blocker:  Yes.   Aspirin:  No. ACEI:  No. ARB:  No. CCB use:  No Other antiplatelets/anticoagulants:  Yes.   Eliquis   ASSESSMENT/PLAN: This is a 79 y.o. male who presents with recurrent episodes of aphasia within 4 days. His symptoms have since resolved and he is back to his baseline. CTA neck shows irregular mixed plaque in the left ICA. There is no severe narrowing but there is calcification and what appears to be thrombus. Patient was initiated on Eliquis at time of discharge and had recurrence despite anticoagulant medication. It is possible that this came from cardiac source however with two similar episodes it is more concerning that this is from the left ICA. Patient was seen and evaluated with Dr. Arbie Cookey. Dr. Arbie Cookey discussed with patient surgery vs medical management. Dr. Arbie Cookey plans to talk to Neurology as far as recommendations on proceeding with surgical intervention vs medically optimizing patient. Patient should continue statin we will stop Eliquis and transition to Heparin gtt pending surgery. Dr. Arbie Cookey will provide further recommendations after  he has discussed patient with Neurologist   Graceann Congress PA-C [facility] [phone] 09/15/2020  2:06 PM

## 2020-09-15 NOTE — Procedures (Signed)
Patient Name: [name]  MRN: [id number]  Epilepsy Attending: Charlsie Quest  Referring Physician/Provider: Dr. Londell Moh Date: 09/14/2020 Duration: 23.41 minutes  Patient history: 79 year old male who presented with 15-minute episode of garbled speech as well as blank stare.  EEG to evaluate for seizures.  Level of alertness: Awake,asleep  AEDs during EEG study: None  Technical aspects: This EEG study was done with scalp electrodes positioned according to the 10-20 International system of electrode placement. Electrical activity was acquired at a sampling rate of 500Hz  and reviewed with a high frequency filter of 70Hz  and a low frequency filter of 1Hz . EEG data were recorded continuously and digitally stored.   Description: The posterior dominant rhythm consists of 9 Hz activity of moderate voltage (25-35 uV) seen predominantly in posterior head regions, symmetric and reactive to eye opening and eye closing. Sleep was characterized by vertex waves, sleep spindles (12 to 14 Hz), maximal frontocentral region. Hyperventilation and photic stimulation were not performed.     IMPRESSION: This study is within normal limits. No seizures or epileptiform discharges were seen throughout the recording.  [name] 

## 2020-09-15 NOTE — Progress Notes (Signed)
ANTICOAGULATION CONSULT NOTE  Pharmacy Consult for Heparin Indication: atrial fibrillation  No Known Allergies  Patient Measurements: Height: 5\' 10"  (177.8 cm) Weight: 72.6 kg (160 lb) (Actual weight not obtained per neurology d/t pt not tPa candidate) IBW/kg (Calculated) : 73 Heparin Dosing Weight: 72.6 kg  Vital Signs: Temp: 97.7 F (36.5 C) (11/03 1155) Temp Source: Oral (11/03 1155) BP: 131/71 (11/03 1330) Pulse Rate: 62 (11/03 1330)  Labs: Recent Labs    09/13/20 0629 09/13/20 0629 09/14/20 0129 09/14/20 0129 09/14/20 1355 09/14/20 1408  HGB 11.6*   < > 12.0*   < > 13.1 12.2*  HCT 33.7*   < > 34.5*  --  38.0* 36.0*  PLT 148*  --  161  --  167  --   APTT  --   --   --   --  41*  --   LABPROT  --   --   --   --  14.4  --   INR  --   --   --   --  1.2  --   CREATININE 1.05   < > 1.02  --  1.12 1.10   < > = values in this interval not displayed.    Estimated Creatinine Clearance: 55 mL/min (by C-G formula based on SCr of 1.1 mg/dL).   Medical History: Past Medical History:  Diagnosis Date  . GERD (gastroesophageal reflux disease)   . Hypertension     Assessment: 79 y.o. recently admitted 10/30-11/2/21 after presenting with slurred speech concerning for TIA/CVA and started on Eliquis 5 mg BID for atrial fibrillation per neurology. Patient presented again on 09/14/20 after having recurrence of symptoms. Last dose of Eliquis 09/15/20 at 1315. Transitioning to heparin per vascular surgery pending possible surgery. Pharmacy has been consulted to dose heparin. Will start heparin when next Eliquis dose would be scheduled, with no bolus. Hgb stable ~12, platelet wnl. Baseline aPTT 41. DOAC will falsely elevate heparin levels, will monitor aPTT for now.   Goal of Therapy:  Heparin level 0.3-0.7 units/ml aPTT 66-102 seconds Monitor platelets by anticoagulation protocol: Yes   Plan:  Start IV heparin at 1050 units/hr  6h aPTT Daily CBC, aPTT until heparin levels  correlate F/u surgery plans and transition back to [facility], PharmD PGY1 Pharmacy Resident 09/15/2020 2:50 PM  Please check [email or web address] for unit-specific pharmacy phone numbers.

## 2020-09-15 NOTE — Progress Notes (Signed)
STROKE TEAM PROGRESS NOTE   INTERVAL HISTORY Wife at the bedside.  Patient readmitted yesterday due to episode of dizziness, expressive aphasia.  Now resolved.  CTA head and neck repeat unchanged left ICA soft plaque.  Due to concern of high risk plaque, consulted vascular surgery Dr. Arbie Cookey, will consider left CEA tomorrow.  Patient and family agreed with the plan.  Hold off Eliquis, on heparin IV in provision for left CEA tomorrow.  Vitals:   09/15/20 0600 09/15/20 0645 09/15/20 0700 09/15/20 0816  BP: 137/65  (!) 136/59 125/74  Pulse: 63 62 62 72  Resp: 13 17 18 15   Temp:      TempSrc:      SpO2: 94% 96% 98% 100%  Weight:      Height:       CBC:  Recent Labs  Lab 09/11/20 2237 09/11/20 2243 09/14/20 0129 09/14/20 0129 09/14/20 1355 09/14/20 1408  WBC 4.1   < > 4.1  --  5.3  --   NEUTROABS 2.0  --   --   --  2.6  --   HGB 10.9*   < > 12.0*   < > 13.1 12.2*  HCT 31.5*   < > 34.5*   < > 38.0* 36.0*  MCV 98.4   < > 95.3  --  98.4  --   PLT 156   < > 161  --  167  --    < > = values in this interval not displayed.   Basic Metabolic Panel:  Recent Labs  Lab 09/13/20 0629 09/13/20 0629 09/14/20 0129 09/14/20 0129 09/14/20 1355 09/14/20 1408  NA 138   < > 138   < > 137 137  K 4.1   < > 4.2   < > 4.2 4.1  CL 107   < > 105   < > 105 103  CO2 24   < > 25  --  24  --   GLUCOSE 96   < > 108*   < > 89 81  BUN 21   < > 21   < > 24* 25*  CREATININE 1.05   < > 1.02   < > 1.12 1.10  CALCIUM 8.8*   < > 9.1  --  9.2  --   MG 2.0  --  1.9  --   --   --    < > = values in this interval not displayed.   Lipid Panel:  Recent Labs  Lab 09/12/20 0500  CHOL 110  TRIG 80  HDL 36*  CHOLHDL 3.1  VLDL 16  LDLCALC 58   HgbA1c:  Recent Labs  Lab 09/12/20 0500  HGBA1C 5.4   Urine Drug Screen:  Recent Labs  Lab 09/12/20 0051  LABOPIA NONE DETECTED  COCAINSCRNUR NONE DETECTED  LABBENZ NONE DETECTED  AMPHETMU NONE DETECTED  THCU NONE DETECTED  LABBARB NONE DETECTED     Alcohol Level  Recent Labs  Lab 09/12/20 0110  ETH <10    IMAGING past 24 hours CT Code Stroke CTA Head W/WO contrast  Result Date: 09/14/2020 CLINICAL DATA:  Aphasia EXAM: CT ANGIOGRAPHY HEAD AND NECK TECHNIQUE: Multidetector CT imaging of the head and neck was performed using the standard protocol during bolus administration of intravenous contrast. Multiplanar CT image reconstructions and MIPs were obtained to evaluate the vascular anatomy. Carotid stenosis measurements (when applicable) are obtained utilizing NASCET criteria, using the distal internal carotid diameter as the denominator. CONTRAST:  52mL OMNIPAQUE IOHEXOL  350 MG/ML SOLN COMPARISON:  09/12/2020 FINDINGS: CTA NECK Aortic arch: Great vessel origins are patent. Right carotid system: Patent. Calcified plaque at the ICA origin with less than 50% stenosis. Left carotid system: Patent. Calcified and irregular fibrofatty plaque with possible ulceration causing less than 50% stenosis. Vertebral arteries: Patent.  Left vertebral artery is dominant. Skeleton: Stable advanced degenerative changes of the cervical spine. Other neck: No new findings.  Right thyroid as previously described. Upper chest: No apical lung mass. Review of the MIP images confirms the above findings CTA HEAD Anterior circulation: Intracranial internal carotid arteries are patent with mild calcified plaque. Anterior cerebral arteries are patent with anterior communicating artery present. Left A1 ACA is dominant. Middle cerebral arteries are patent. Posterior circulation: Intracranial vertebral arteries are patent with mild calcified plaque on the left. Patent PICA origins. Basilar artery is patent. Superior cerebellar artery origins are patent. Posterior cerebral arteries are patent. Small left posterior communicating artery is present. Venous sinuses: Patent as allowed by contrast bolus timing. Review of the MIP images confirms the above findings IMPRESSION: No significant  change since 09/12/2020. No new large vessel occlusion or hemodynamically significant stenosis. 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 Electronically Signed   By: Guadlupe Spanish M.D.   On: 09/14/2020 16:08   CT Code Stroke CTA Neck W/WO contrast  Result Date: 09/14/2020 CLINICAL DATA:  Aphasia EXAM: CT ANGIOGRAPHY HEAD AND NECK TECHNIQUE: Multidetector CT imaging of the head and neck was performed using the standard protocol during bolus administration of intravenous contrast. Multiplanar CT image reconstructions and MIPs were obtained to evaluate the vascular anatomy. Carotid stenosis measurements (when applicable) are obtained utilizing NASCET criteria, using the distal internal carotid diameter as the denominator. CONTRAST:  68mL OMNIPAQUE IOHEXOL 350 MG/ML SOLN COMPARISON:  09/12/2020 FINDINGS: CTA NECK Aortic arch: Great vessel origins are patent. Right carotid system: Patent. Calcified plaque at the ICA origin with less than 50% stenosis. Left carotid system: Patent. Calcified and irregular fibrofatty plaque with possible ulceration causing less than 50% stenosis. Vertebral arteries: Patent.  Left vertebral artery is dominant. Skeleton: Stable advanced degenerative changes of the cervical spine. Other neck: No new findings.  Right thyroid as previously described. Upper chest: No apical lung mass. Review of the MIP images confirms the above findings CTA HEAD Anterior circulation: Intracranial internal carotid arteries are patent with mild calcified plaque. Anterior cerebral arteries are patent with anterior communicating artery present. Left A1 ACA is dominant. Middle cerebral arteries are patent. Posterior circulation: Intracranial vertebral arteries are patent with mild calcified plaque on the left. Patent PICA origins. Basilar artery is patent. Superior cerebellar artery origins are patent. Posterior cerebral arteries are patent. Small left  posterior communicating artery is present. Venous sinuses: Patent as allowed by contrast bolus timing. Review of the MIP images confirms the above findings IMPRESSION: No significant change since 09/12/2020. No new large vessel occlusion or hemodynamically significant stenosis. 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 Electronically Signed   By: Guadlupe Spanish M.D.   On: 09/14/2020 16:08   CT HEAD CODE STROKE WO CONTRAST  Result Date: 09/14/2020 CLINICAL DATA:  Code stroke.  Follow-up stroke presentation. EXAM: CT HEAD WITHOUT CONTRAST TECHNIQUE: Contiguous axial images were obtained from the base of the skull through the vertex without intravenous contrast. COMPARISON:  CT studies 09/12/2020 and 09/14/2019. FINDINGS: Brain: Age related atrophy. Chronic small-vessel ischemic changes of  the cerebral hemispheric white matter. No sign of acute infarction, mass lesion, hemorrhage, hydrocephalus or extra-axial collection. Vascular: There is atherosclerotic calcification of the major vessels at the base of the brain. Skull: Negative Sinuses/Orbits: Clear/normal Other: None ASPECTS (Alberta Stroke Program Early CT Score) - Ganglionic level infarction (caudate, lentiform nuclei, internal capsule, insula, M1-M3 cortex): 7 - Supraganglionic infarction (M4-M6 cortex): 3 Total score (0-10 with 10 being normal): 10 IMPRESSION: 1. No acute finding by CT. Age related atrophy and chronic small-vessel ischemic changes of the white matter. 2. ASPECTS is 10. Electronically Signed   By: Paulina Fusi M.D.   On: 09/14/2020 14:05    PHYSICAL EXAM General - Well nourished, well developed, in no apparent distress.  Ophthalmologic - fundi not visualized due to noncooperation.  Cardiovascular - Regular rhythm and rate.  Mental Status -  Level of arousal and orientation to time, place, and person were intact. Language including expression, naming, repetition,  comprehension was assessed and found intact. Fund of Knowledge was assessed and was intact.  Cranial Nerves II - XII - II - Visual field intact OU. III, IV, VI - Extraocular movements intact. V - Facial sensation intact bilaterally. VII - Facial movement intact bilaterally. VIII - Hearing & vestibular intact bilaterally. X - Palate elevates symmetrically. XI - Chin turning & shoulder shrug intact bilaterally. XII - Tongue protrusion intact.  Motor Strength - The patient's strength was normal in all extremities and pronator drift was absent.  Bulk was normal and fasciculations were absent.   Motor Tone - Muscle tone was assessed at the neck and appendages and was normal.  Reflexes - The patient's reflexes were symmetrical in all extremities and he had no pathological reflexes.  Sensory - Light touch, temperature/pinprick were assessed and were symmetrical.    Coordination - The patient had normal movements in the hands with no ataxia or dysmetria.  Tremor was absent.  Gait and Station - walking with PT in hallway, no fall tendency, chronic limp on the left side.    ASSESSMENT/PLAN [name] is a 79 y.o. male with history of HTN, GERD, bradycardia s/p pacemaker in 2016, L ICA plaque who was discharged from [facility]. [facility] earler that day after presenting with word finding difficulty. Dx w/ TIA and started on Eliquis for confirmed afib. After arrival home, he took a shower and had subsequent recurrent transient aphasia.   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 ASPECTS 10  CTA H&N repeat - stable. No LVO or significant stenosis. Irregular L ICA plaque at origin, unchanged.   LDL - 58  HgbA1c - 5.4  UDS - negative  VTE prophylaxis - Eliquis  aspirin 81 mg daily prior to admission, now on Eliquis.  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 Arbie Cookey on board  Plan for left CEA tomorrow  On heparin IV, holding off Eliquis tonight  PAF/Aflutter Vtach SVT  A. fib listed in his medical problem list  Pt cardiologist in [place] Dr. Virgina Organ contacted by Dr. Nelson Chimes and confirmed that pt did have short run of afib but low burden in the  past. No AC started   Pacemaker interrogation +SVT, short run of aflutter per Dr. Lalla BrothersLambert    Asymptomatic Vtach 12-13 beats 10/31  On eliquis for stroke prevention, holding off tonight, on heparin IV in preparation for tomorrow procedure  [place] moved from [place] to [place] days ago, set up with Dr. Lalla BrothersLambert follow up as outpt.   Hypertension  Home BP meds: metoprolol 25 daily  BP stable   BP goal normotensive  Hyperlipidemia  Home Lipid lowering medication: pravastatin 40  LDL 58, goal < 70  Change to Lipitor 40 mg daily   Continue statin at discharge  Other Stroke Risk Factors  Advanced age  ETOH use, advised to drink no more than 2 alcoholic beverage per day.  Family hx strokes  Other Active Problems  Aortic Atherosclerosis   Bradycardia s/p pacer  GERD  Hospital day # 0  Patient condition critical, has developed recurrent expressive aphasia. I discussed with Dr. Arbie CookeyEarly from vascular surgery.  I also discussed with Dr. Renford DillsAdhikari attending physician. I spent  35 minutes in total face-to-face time with the patient, more than 50% of which was spent in counseling and coordination of care, reviewing test results, images and medication, and discussing the diagnosis, treatment plan and potential prognosis. This patient's care requiresreview of multiple databases, neurological assessment, discussion with family, other  specialists and medical decision making of high complexity. I had long discussion with patient and wife at bedside, updated pt current condition, treatment plan and potential prognosis, and answered all the questions.  They expressed understanding and appreciation.   Marvel PlanJindong Natash Berman, MD PhD Stroke Neurology 09/15/2020 8:37 PM    To contact Stroke Continuity provider, please refer to [email or web address]. After hours, contact General Neurology

## 2020-09-15 NOTE — H&P (View-Only) (Signed)
Patient ID: [name], male   DOB: 09/13/1941, 79 y.o.   MRN: [id number] I discussed the patient with Dr.Xu.  I do feel the most prudent treatment would be left carotid endarterectomy since this is the most likely source of his 2 TIA events.  I again discussed this with the patient and his wife in detail including the details of the surgery and potential risk to include intraoperative stroke, cranial nerve injury, bleeding and infection.  I did also explain the expected recovery following surgery.  I am not in town tomorrow for the rest of the week.  I did review his films with Dr.Cain who will be doing the surgery tomorrow.  I discussed this with the patient and his wife as well 

## 2020-09-15 NOTE — Progress Notes (Signed)
PROGRESS NOTE    [name]  MAU:[id number] DOB: Dec 16, 1940 DOA: 09/14/2020 PCP: Patient, No Pcp Per   Chief Complain: Slurred speech  Brief Narrative: Patient is a 79 year old male with history of arrhythmia status post pacemaker placement, hypertension, GERD who was just discharged from here after management of TIA when he presented with slurred speech.  He presented with same complaint this time.  On his last admission CTA head and neck was negative for large vessel occlusion or high-grade stenosis but showed left ICA plaque.  His pacemaker was interrogated on last admission which shows possible atrial flutter and was started on Eliquis.  As soon as he went home, he started having symptoms of slurred speech and presented back to the emergency department.  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  Vascular surgery was consulted at this time, now the plan is for left carotid endarterectomy tomorrow.  Assessment & Plan:   Active Problems:   Presence of permanent cardiac pacemaker   Essential hypertension   TIA (transient ischemic attack)   Slurred speech/TIA:  On his last admission CTA head and neck was negative for large vessel occlusion or high-grade stenosis but showed left ICA plaque.  His pacemaker was interrogated on last admission which shows possible atrial flutter and was started on Eliquis.  As soon as he went home, he started having symptoms of slurred speech and presented back to the emergency department.  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  Vascular surgery was consulted at this time, now the plan is for left carotid endarterectomy tomorrow. Neurology following.  PT/OT did not recommend any follow-up on last admission. Echocardiogram on  last admission showed ejection fraction of 60% with mild left ventricular hypertrophy. LDL of 58, hemoglobin A1C of 5.4.  Continue Lipitor. EEG was done for the suspicion of seizures but did not show any epileptiform activities or seizures. His speech is  clear now.  He does not have any focal neurological deficits.  History of arrhythmia: Follows with cardiologist at [facility].  Has pacemaker.  As per his cardiologist, he has history of minimal burden A. fib so was not put on any anticoagulation.  Eliquis started on last admission, currently on hold for surgery..  Hypertension: Monitor blood pressure.  Currently blood pressure stable.  Antihypertensives on hold.           DVT prophylaxis:eliquis Code Status: Full Family Communication: Wife at bedside Status is: Observation  The patient remains OBS appropriate and will d/c before 2 midnights.  Dispo: The patient is from: Home              Anticipated d/c is to: Home              Anticipated d/c date is: 2 days              Patient currently is not medically stable to d/c.     Consultants: Vascular surgery, neurology  Procedures: None  Antimicrobials:  Anti-infectives (From admission, onward)   None      Subjective: Patient seen and examined at the bedside this afternoon in the emerge department.  Hemodynamically stable during my evaluation.  Speech is clear.  Denies any complaints.  Objective: Vitals:   09/15/20 1300 09/15/20 1330  09/15/20 1400 09/15/20 1430  BP: 107/86 131/71 (!) 150/63 (!) 143/62  Pulse: 65 62 63 62  Resp: 16 15 15 15   Temp:      TempSrc:      SpO2: 97% 99% 99% 99%  Weight:      Height:       No intake or output data in the 24 hours ending 09/15/20 1501 Filed Weights   09/14/20 1428  Weight: 72.6 kg    Examination:  General exam: Appears calm and comfortable ,Not in distress,average built HEENT:PERRL,Oral mucosa moist, Ear/Nose normal on gross exam Respiratory system: Bilateral  equal air entry, normal vesicular breath sounds, no wheezes or crackles  Cardiovascular system: S1 & S2 heard, RRR. No JVD, murmurs, rubs, gallops or clicks. No pedal edema. Gastrointestinal system: Abdomen is nondistended, soft and nontender. No organomegaly or masses felt. Normal bowel sounds heard. Central nervous system: Alert and oriented. No focal neurological deficits. Extremities: No edema, no clubbing ,no cyanosis Skin: No rashes, lesions or ulcers,no icterus ,no pallor   Data Reviewed: I have personally reviewed following labs and imaging studies  CBC: Recent Labs  Lab 09/11/20 2237 09/11/20 2237 09/11/20 2243 09/13/20 0629 09/14/20 0129 09/14/20 1355 09/14/20 1408  WBC 4.1  --   --  4.2 4.1 5.3  --   NEUTROABS 2.0  --   --   --   --  2.6  --   HGB 10.9*   < > 9.9* 11.6* 12.0* 13.1 12.2*  HCT 31.5*   < > 29.0* 33.7* 34.5* 38.0* 36.0*  MCV 98.4  --   --  95.7 95.3 98.4  --   PLT 156  --   --  148* 161 167  --    < > = values in this interval not displayed.   Basic Metabolic Panel: Recent Labs  Lab 09/11/20 2237 09/11/20 2237 09/11/20 2243 09/13/20 0629 09/14/20 0129 09/14/20 1355 09/14/20 1408  NA 135   < > 139 138 138 137 137  K 4.0   < > 4.0 4.1 4.2 4.2 4.1  CL 105   < > 106 107 105 105 103  CO2 23  --   --  24 25 24   --   GLUCOSE 114*   < > 112* 96 108* 89 81  BUN 30*   < > 29* 21 21 24* 25*  CREATININE 1.21   < > 1.20 1.05 1.02 1.12 1.10  CALCIUM 8.5*  --   --  8.8* 9.1 9.2  --   MG  --   --   --  2.0 1.9  --   --    < > = values in this interval not displayed.   GFR: Estimated Creatinine Clearance: 55 mL/min (by C-G formula based on SCr of 1.1 mg/dL). Liver Function Tests: Recent Labs  Lab 09/11/20 2237 09/14/20 1355  AST 24 31  ALT 23 27  ALKPHOS 86 91  BILITOT 0.9 0.9  PROT 5.7* 6.7  ALBUMIN 3.4* 3.7   No results for input(s): LIPASE, AMYLASE in the last 168 hours. No results for input(s): AMMONIA in the last 168 hours. Coagulation  Profile: Recent Labs  Lab 09/11/20 2237 09/14/20 1355  INR 1.1 1.2   Cardiac Enzymes: No results for input(s): CKTOTAL, CKMB, CKMBINDEX, TROPONINI in the last 168 hours. BNP (last 3 results) No results for input(s): PROBNP in the last 8760 hours. HbA1C: No results for input(s): HGBA1C in the last 72 hours. CBG: Recent Labs  Lab 09/11/20 2150 09/14/20 1354  GLUCAP 106* 86   Lipid Profile: No results for input(s): CHOL, HDL, LDLCALC, TRIG, CHOLHDL, LDLDIRECT in the last 72 hours. Thyroid Function Tests: Recent Labs    09/13/20 0626  TSH 1.066   Anemia Panel: Recent Labs    09/13/20 0626  VITAMINB12 240  FOLATE 28.1  FERRITIN 45  TIBC 329  IRON 75   Sepsis Labs: No results for input(s): PROCALCITON, LATICACIDVEN in the last 168 hours.  Recent Results (from the past 240 hour(s))  Respiratory Panel by RT PCR (Flu A&B, Covid) - Nasopharyngeal Swab     Status: None   Collection Time: 09/12/20 11:28 AM   Specimen: Nasopharyngeal Swab  Result Value Ref Range Status   SARS Coronavirus 2 by RT PCR NEGATIVE NEGATIVE Final    Comment: (NOTE) SARS-CoV-2 target nucleic acids are NOT DETECTED.  The SARS-CoV-2 RNA is generally detectable in upper respiratoy specimens during the acute phase of infection. The lowest concentration of SARS-CoV-2 viral copies this assay can detect is 131 copies/mL. A negative result does not preclude SARS-Cov-2 infection and should not be used as the sole basis for treatment or other patient management decisions. A negative result may occur with  improper specimen collection/handling, submission of specimen other than nasopharyngeal swab, presence of viral mutation(s) within the areas targeted by this assay, and inadequate number of viral copies (<131 copies/mL). A negative result must be combined with clinical observations, patient history, and epidemiological information. The expected result is Negative.  Fact Sheet for Patients:    https://www.moore.com/  Fact Sheet for Healthcare Providers:  https://www.young.biz/  This test is no t yet approved or cleared by the [place] FDA and  has been authorized for detection and/or diagnosis of SARS-CoV-2 by FDA under an Emergency Use Authorization (EUA). This EUA will remain  in effect (meaning this test can be used) for the duration of the COVID-19 declaration under Section 564(b)(1) of the Act, 21 U.S.C. section 360bbb-3(b)(1), unless the authorization is terminated or revoked sooner.     Influenza A by PCR NEGATIVE NEGATIVE Final   Influenza B by PCR NEGATIVE NEGATIVE Final    Comment: (NOTE) The Xpert Xpress SARS-CoV-2/FLU/RSV assay is intended as an aid in  the diagnosis of influenza from Nasopharyngeal swab specimens and  should not be used as a sole basis for treatment. Nasal washings and  aspirates are unacceptable for Xpert Xpress SARS-CoV-2/FLU/RSV  testing.  Fact Sheet for Patients: https://www.moore.com/  Fact Sheet for Healthcare Providers: https://www.young.biz/  This test is not yet approved or cleared by the [place] FDA and  has been authorized for detection and/or diagnosis of SARS-CoV-2 by  FDA under an Emergency Use Authorization (EUA). This EUA will remain  in effect (meaning this test can be used) for the duration of the  Covid-19 declaration under Section 564(b)(1) of the Act, 21  U.S.C. section 360bbb-3(b)(1), unless the authorization is  terminated or revoked. Performed at [facility], [address] [place], [place] [address]          Radiology Studies: EEG  Result Date: 09/15/2020 Charlsie Quest, MD     09/15/2020  9:58 AM Patient Name: [name] MRN: [id number] Epilepsy Attending: Charlsie Quest Referring Physician/Provider: Dr. Londell Moh Date: 09/14/2020 Duration: 23.41 minutes Patient history: 79 year old male who presented with  15-minute episode of garbled speech as well as blank stare.  EEG to evaluate for seizures. Level of alertness: Awake,asleep AEDs during EEG study: None  Technical aspects: This EEG study was done with scalp electrodes positioned according to the 10-20 International system of electrode placement. Electrical activity was acquired at a sampling rate of  and reviewed with a high frequency filter of  and a low frequency filter of . EEG data were recorded continuously and digitally stored. Description: The posterior dominant rhythm consists of 9 Hz activity of moderate voltage (25-35 uV) seen predominantly in posterior head regions, symmetric and reactive to eye opening and eye closing. Sleep was characterized by vertex waves, sleep spindles (12 to 14 Hz), maximal frontocentral region. Hyperventilation and photic stimulation were not performed.   IMPRESSION: This study is within normal limits. No seizures or epileptiform discharges were seen throughout the recording. Charlsie Quest   CT Code Stroke CTA Head W/WO contrast  Result Date: 09/14/2020 CLINICAL DATA:  Aphasia EXAM: CT ANGIOGRAPHY HEAD AND NECK TECHNIQUE: Multidetector CT imaging of the head and neck was performed using the standard protocol during bolus administration of intravenous contrast. Multiplanar CT image reconstructions and MIPs were obtained to evaluate the vascular anatomy. Carotid stenosis measurements (when applicable) are obtained utilizing NASCET criteria, using the distal internal carotid diameter as the denominator. CONTRAST:  75mL OMNIPAQUE IOHEXOL 350 MG/ML SOLN COMPARISON:  09/12/2020 FINDINGS: CTA NECK Aortic arch: Great vessel origins are patent. Right carotid system: Patent. Calcified plaque at the ICA origin with less than 50% stenosis. Left carotid system: Patent. Calcified and irregular fibrofatty plaque with possible ulceration causing less than 50% stenosis. Vertebral arteries: Patent.  Left vertebral artery is  dominant. Skeleton: Stable advanced degenerative changes of the cervical spine. Other neck: No new findings.  Right thyroid as previously described. Upper chest: No apical lung mass. Review of the MIP images confirms the above findings CTA HEAD Anterior circulation: Intracranial internal carotid arteries are patent with mild calcified plaque. Anterior cerebral arteries are patent with anterior communicating artery present. Left A1 ACA is dominant. Middle cerebral arteries are patent. Posterior circulation: Intracranial vertebral arteries are patent with mild calcified plaque on the left. Patent PICA origins. Basilar artery is patent. Superior cerebellar artery origins are patent. Posterior cerebral arteries are patent. Small left posterior communicating artery is present. Venous sinuses: Patent as allowed by contrast bolus timing. Review of the MIP images confirms the above findings IMPRESSION: No significant change since 09/12/2020. No new large vessel occlusion or hemodynamically significant stenosis. 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 Electronically Signed   By: Guadlupe Spanish M.D.   On: 09/14/2020 16:08   CT Code Stroke CTA Neck W/WO contrast  Result Date: 09/14/2020 CLINICAL DATA:  Aphasia EXAM: CT ANGIOGRAPHY HEAD AND NECK TECHNIQUE: Multidetector CT imaging of the head and neck was performed using the standard protocol during bolus administration of intravenous contrast. Multiplanar CT image reconstructions and MIPs were obtained to evaluate the vascular anatomy. Carotid stenosis measurements (when applicable) are obtained utilizing NASCET criteria, using the distal internal carotid diameter as the denominator. CONTRAST:  75mL OMNIPAQUE IOHEXOL 350 MG/ML SOLN COMPARISON:  09/12/2020 FINDINGS: CTA NECK Aortic arch: Great vessel origins are patent. Right carotid system: Patent. Calcified plaque at the ICA origin with less than 50% stenosis.  Left carotid system: Patent. Calcified and irregular fibrofatty plaque with possible ulceration causing less than 50% stenosis. Vertebral arteries: Patent.  Left vertebral artery is dominant. Skeleton: Stable advanced degenerative changes of the cervical spine. Other neck: No new findings.  Right thyroid as previously described. Upper chest: No  apical lung mass. Review of the MIP images confirms the above findings CTA HEAD Anterior circulation: Intracranial internal carotid arteries are patent with mild calcified plaque. Anterior cerebral arteries are patent with anterior communicating artery present. Left A1 ACA is dominant. Middle cerebral arteries are patent. Posterior circulation: Intracranial vertebral arteries are patent with mild calcified plaque on the left. Patent PICA origins. Basilar artery is patent. Superior cerebellar artery origins are patent. Posterior cerebral arteries are patent. Small left posterior communicating artery is present. Venous sinuses: Patent as allowed by contrast bolus timing. Review of the MIP images confirms the above findings IMPRESSION: No significant change since 09/12/2020. No new large vessel occlusion or hemodynamically significant stenosis. 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 Electronically Signed   By: Guadlupe Spanish M.D.   On: 09/14/2020 16:08   CT HEAD CODE STROKE WO CONTRAST  Result Date: 09/14/2020 CLINICAL DATA:  Code stroke.  Follow-up stroke presentation. EXAM: CT HEAD WITHOUT CONTRAST TECHNIQUE: Contiguous axial images were obtained from the base of the skull through the vertex without intravenous contrast. COMPARISON:  CT studies 09/12/2020 and 09/14/2019. FINDINGS: Brain: Age related atrophy. Chronic small-vessel ischemic changes of the cerebral hemispheric white matter. No sign of acute infarction, mass lesion, hemorrhage, hydrocephalus or extra-axial collection. Vascular: There is  atherosclerotic calcification of the major vessels at the base of the brain. Skull: Negative Sinuses/Orbits: Clear/normal Other: None ASPECTS (Alberta Stroke Program Early CT Score) - Ganglionic level infarction (caudate, lentiform nuclei, internal capsule, insula, M1-M3 cortex): 7 - Supraganglionic infarction (M4-M6 cortex): 3 Total score (0-10 with 10 being normal): 10 IMPRESSION: 1. No acute finding by CT. Age related atrophy and chronic small-vessel ischemic changes of the white matter. 2. ASPECTS is 10. Electronically Signed   By: Paulina Fusi M.D.   On: 09/14/2020 14:05        Scheduled Meds: .  stroke: mapping our early stages of recovery book   Does not apply Once  . atorvastatin  40 mg Oral Daily  . budesonide  0.25 mg Nebulization BID  . cholecalciferol  1,000 Units Oral QHS  . sodium chloride flush  3 mL Intravenous Once   Continuous Infusions:   LOS: 0 days    Time spent: 25 mins.More than 50% of that time was spent in counseling and/or coordination of care.      Burnadette Pop, MD [facility] P11/01/2020, 3:01 PM

## 2020-09-15 NOTE — Progress Notes (Signed)
Patient ID: [name], male   DOB: [birth year], 79 y.o.   MRN: [id number] I discussed the patient with Dr.Xu.  I do feel the most prudent treatment would be left carotid endarterectomy since this is the most likely source of his 2 TIA events.  I again discussed this with the patient and his wife in detail including the details of the surgery and potential risk to include intraoperative stroke, cranial nerve injury, bleeding and infection.  I did also explain the expected recovery following surgery.  I am not in town tomorrow for the rest of the week.  I did review his films with Dr.Cain who will be doing the surgery tomorrow.  I discussed this with the patient and his wife as well

## 2020-09-16 ENCOUNTER — Inpatient Hospital Stay (HOSPITAL_COMMUNITY): Payer: Medicare Other | Admitting: Anesthesiology

## 2020-09-16 ENCOUNTER — Encounter (HOSPITAL_COMMUNITY)
Admission: EM | Disposition: A | Discharge status: Home Health | Payer: Self-pay | Source: Home / Self Care | Attending: Internal Medicine

## 2020-09-16 HISTORY — PX: ENDARTERECTOMY: SHX5162

## 2020-09-16 LAB — CBC
HCT: 34.6 % — ABNORMAL LOW (ref 39.0–52.0)
Hemoglobin: 12 g/dL — ABNORMAL LOW (ref 13.0–17.0)
MCH: 33.5 pg (ref 26.0–34.0)
MCHC: 34.7 g/dL (ref 30.0–36.0)
MCV: 96.6 fL (ref 80.0–100.0)
Platelets: 165 10*3/uL (ref 150–400)
RBC: 3.58 MIL/uL — ABNORMAL LOW (ref 4.22–5.81)
RDW: 13 % (ref 11.5–15.5)
WBC: 5.2 10*3/uL (ref 4.0–10.5)
nRBC: 0 % (ref 0.0–0.2)

## 2020-09-16 LAB — ABO/RH: ABO/RH(D): O POS

## 2020-09-16 LAB — TYPE AND SCREEN
ABO/RH(D): O POS
Antibody Screen: NEGATIVE

## 2020-09-16 LAB — APTT: aPTT: 98 seconds — ABNORMAL HIGH (ref 24–36)

## 2020-09-16 LAB — MRSA PCR SCREENING: MRSA by PCR: NEGATIVE

## 2020-09-16 SURGERY — ENDARTERECTOMY, CAROTID
Anesthesia: General | Site: Neck | Laterality: Left

## 2020-09-16 MED ORDER — HEPARIN SODIUM (PORCINE) 1000 UNIT/ML IJ SOLN
INTRAMUSCULAR | Status: DC | PRN
  Administered 2020-09-16: 8000 [IU] via INTRAVENOUS

## 2020-09-16 MED ORDER — SODIUM CHLORIDE 0.9 % IV SOLN
INTRAVENOUS | Status: DC | PRN
  Administered 2020-09-16: 500 mL

## 2020-09-16 MED ORDER — PHENYLEPHRINE 40 MCG/ML (10ML) SYRINGE FOR IV PUSH (FOR BLOOD PRESSURE SUPPORT)
PREFILLED_SYRINGE | INTRAVENOUS | Status: AC
  Filled 2020-09-16: qty 10

## 2020-09-16 MED ORDER — ALUM & MAG HYDROXIDE-SIMETH 200-200-20 MG/5ML PO SUSP: 15.0000 mL | ORAL | Status: DC | PRN

## 2020-09-16 MED ORDER — ROCURONIUM BROMIDE 10 MG/ML (PF) SYRINGE
PREFILLED_SYRINGE | INTRAVENOUS | Status: DC | PRN
  Administered 2020-09-16: 50 mg via INTRAVENOUS

## 2020-09-16 MED ORDER — 0.9 % SODIUM CHLORIDE (POUR BTL) OPTIME
TOPICAL | Status: DC | PRN
  Administered 2020-09-16 (×3): 1000 mL

## 2020-09-16 MED ORDER — DOCUSATE SODIUM 100 MG PO CAPS
100.0000 mg | ORAL_CAPSULE | Freq: Every day | ORAL | Status: DC
  Administered 2020-09-17: 100 mg via ORAL
  Filled 2020-09-16: qty 1

## 2020-09-16 MED ORDER — LIDOCAINE HCL (PF) 1 % IJ SOLN
INTRAMUSCULAR | Status: AC
  Filled 2020-09-16: qty 30

## 2020-09-16 MED ORDER — LIDOCAINE 2% (20 MG/ML) 5 ML SYRINGE
INTRAMUSCULAR | Status: AC
  Filled 2020-09-16: qty 5

## 2020-09-16 MED ORDER — BUDESONIDE 0.25 MG/2ML IN SUSP
0.2500 mg | Freq: Two times a day (BID) | RESPIRATORY_TRACT | Status: DC
  Administered 2020-09-16 – 2020-09-17 (×3): 0.25 mg via RESPIRATORY_TRACT
  Filled 2020-09-16 (×3): qty 2

## 2020-09-16 MED ORDER — SODIUM CHLORIDE 0.9 % IV SOLN: INTRAVENOUS | Status: DC

## 2020-09-16 MED ORDER — FENTANYL CITRATE (PF) 100 MCG/2ML IJ SOLN: 25.0000 ug | INTRAMUSCULAR | Status: DC | PRN

## 2020-09-16 MED ORDER — CEFAZOLIN SODIUM-DEXTROSE 2-4 GM/100ML-% IV SOLN
2.0000 g | Freq: Three times a day (TID) | INTRAVENOUS | Status: AC
  Administered 2020-09-16 – 2020-09-17 (×2): 2 g via INTRAVENOUS
  Filled 2020-09-16 (×2): qty 100

## 2020-09-16 MED ORDER — PHENOL 1.4 % MT LIQD
1.0000 | OROMUCOSAL | Status: DC | PRN
  Administered 2020-09-17: 1 via OROMUCOSAL
  Filled 2020-09-16: qty 177

## 2020-09-16 MED ORDER — EPHEDRINE 5 MG/ML INJ
INTRAVENOUS | Status: AC
  Filled 2020-09-16: qty 10

## 2020-09-16 MED ORDER — OXYCODONE HCL 5 MG PO TABS
5.0000 mg | ORAL_TABLET | ORAL | Status: DC | PRN
  Administered 2020-09-16: 5 mg via ORAL
  Administered 2020-09-16 – 2020-09-17 (×2): 10 mg via ORAL
  Filled 2020-09-16: qty 2
  Filled 2020-09-16: qty 1
  Filled 2020-09-16: qty 2

## 2020-09-16 MED ORDER — DEXAMETHASONE SODIUM PHOSPHATE 10 MG/ML IJ SOLN
INTRAMUSCULAR | Status: DC | PRN
  Administered 2020-09-16: 5 mg via INTRAVENOUS

## 2020-09-16 MED ORDER — PHENYLEPHRINE HCL-NACL 10-0.9 MG/250ML-% IV SOLN
INTRAVENOUS | Status: DC | PRN
  Administered 2020-09-16: 25 ug/min via INTRAVENOUS

## 2020-09-16 MED ORDER — ONDANSETRON HCL 4 MG/2ML IJ SOLN
INTRAMUSCULAR | Status: DC | PRN
  Administered 2020-09-16: 4 mg via INTRAVENOUS

## 2020-09-16 MED ORDER — ROCURONIUM BROMIDE 10 MG/ML (PF) SYRINGE
PREFILLED_SYRINGE | INTRAVENOUS | Status: AC
  Filled 2020-09-16: qty 10

## 2020-09-16 MED ORDER — FENTANYL CITRATE (PF) 100 MCG/2ML IJ SOLN
INTRAMUSCULAR | Status: AC
  Administered 2020-09-16: 50 ug via INTRAVENOUS
  Filled 2020-09-16: qty 2

## 2020-09-16 MED ORDER — LIDOCAINE 2% (20 MG/ML) 5 ML SYRINGE
INTRAMUSCULAR | Status: DC | PRN
  Administered 2020-09-16: 80 mg via INTRAVENOUS

## 2020-09-16 MED ORDER — SODIUM CHLORIDE 0.9 % IV SOLN: 500.0000 mL | Freq: Once | INTRAVENOUS | Status: DC | PRN

## 2020-09-16 MED ORDER — MAGNESIUM SULFATE 2 GM/50ML IV SOLN: 2.0000 g | Freq: Every day | INTRAVENOUS | Status: DC | PRN

## 2020-09-16 MED ORDER — SODIUM CHLORIDE 0.9 % IV SOLN
0.0125 ug/kg/min | INTRAVENOUS | Status: DC
  Administered 2020-09-16: .1 ug/kg/min via INTRAVENOUS
  Filled 2020-09-16: qty 2000

## 2020-09-16 MED ORDER — BISACODYL 5 MG PO TBEC: 5.0000 mg | DELAYED_RELEASE_TABLET | Freq: Every day | ORAL | Status: DC | PRN

## 2020-09-16 MED ORDER — ONDANSETRON HCL 4 MG/2ML IJ SOLN
INTRAMUSCULAR | Status: AC
  Filled 2020-09-16: qty 2

## 2020-09-16 MED ORDER — PROPOFOL 10 MG/ML IV BOLUS
INTRAVENOUS | Status: DC | PRN
  Administered 2020-09-16: 140 mg via INTRAVENOUS

## 2020-09-16 MED ORDER — POTASSIUM CHLORIDE CRYS ER 20 MEQ PO TBCR: 20.0000 meq | EXTENDED_RELEASE_TABLET | Freq: Every day | ORAL | Status: DC | PRN

## 2020-09-16 MED ORDER — LABETALOL HCL 5 MG/ML IV SOLN: 10.0000 mg | INTRAVENOUS | Status: DC | PRN

## 2020-09-16 MED ORDER — HYDROMORPHONE HCL 1 MG/ML IJ SOLN: 0.5000 mg | INTRAMUSCULAR | Status: DC | PRN

## 2020-09-16 MED ORDER — PHENYLEPHRINE 40 MCG/ML (10ML) SYRINGE FOR IV PUSH (FOR BLOOD PRESSURE SUPPORT)
PREFILLED_SYRINGE | INTRAVENOUS | Status: DC | PRN
  Administered 2020-09-16: 80 ug via INTRAVENOUS

## 2020-09-16 MED ORDER — SUGAMMADEX SODIUM 200 MG/2ML IV SOLN
INTRAVENOUS | Status: DC | PRN
  Administered 2020-09-16: 200 mg via INTRAVENOUS

## 2020-09-16 MED ORDER — PROTAMINE SULFATE 10 MG/ML IV SOLN
INTRAVENOUS | Status: DC | PRN
  Administered 2020-09-16: 20 mg via INTRAVENOUS
  Administered 2020-09-16: 30 mg via INTRAVENOUS

## 2020-09-16 MED ORDER — CHLORHEXIDINE GLUCONATE 0.12 % MT SOLN
OROMUCOSAL | Status: AC
  Administered 2020-09-16: 15 mL via OROMUCOSAL
  Filled 2020-09-16: qty 15

## 2020-09-16 MED ORDER — HYDRALAZINE HCL 20 MG/ML IJ SOLN: 5.0000 mg | INTRAMUSCULAR | Status: DC | PRN

## 2020-09-16 MED ORDER — PROPOFOL 10 MG/ML IV BOLUS
INTRAVENOUS | Status: AC
  Filled 2020-09-16: qty 20

## 2020-09-16 MED ORDER — CHLORHEXIDINE GLUCONATE 0.12 % MT SOLN
15.0000 mL | Freq: Once | OROMUCOSAL | Status: AC
  Filled 2020-09-16: qty 15

## 2020-09-16 MED ORDER — METOPROLOL TARTRATE 5 MG/5ML IV SOLN: 2.0000 mg | INTRAVENOUS | Status: DC | PRN

## 2020-09-16 MED ORDER — LACTATED RINGERS IV SOLN: INTRAVENOUS | Status: DC

## 2020-09-16 MED ORDER — ACETAMINOPHEN 500 MG PO TABS
1000.0000 mg | ORAL_TABLET | Freq: Once | ORAL | Status: AC
  Administered 2020-09-16: 1000 mg via ORAL
  Filled 2020-09-16: qty 2

## 2020-09-16 MED ORDER — HEMOSTATIC AGENTS (NO CHARGE) OPTIME
TOPICAL | Status: DC | PRN
  Administered 2020-09-16 (×2): 1 via TOPICAL

## 2020-09-16 MED ORDER — CEFAZOLIN SODIUM-DEXTROSE 2-3 GM-%(50ML) IV SOLR
INTRAVENOUS | Status: DC | PRN
  Administered 2020-09-16: 2 g via INTRAVENOUS

## 2020-09-16 MED ORDER — PROTAMINE SULFATE 10 MG/ML IV SOLN
INTRAVENOUS | Status: AC
  Filled 2020-09-16: qty 5

## 2020-09-16 MED ORDER — SODIUM CHLORIDE 0.9 % IV SOLN
INTRAVENOUS | Status: AC
  Filled 2020-09-16: qty 1.2

## 2020-09-16 MED ORDER — PANTOPRAZOLE SODIUM 40 MG PO TBEC
40.0000 mg | DELAYED_RELEASE_TABLET | Freq: Every day | ORAL | Status: DC
  Administered 2020-09-16 – 2020-09-17 (×2): 40 mg via ORAL
  Filled 2020-09-16 (×2): qty 1

## 2020-09-16 MED ORDER — ASPIRIN EC 81 MG PO TBEC
81.0000 mg | DELAYED_RELEASE_TABLET | Freq: Every day | ORAL | Status: DC
  Administered 2020-09-17: 81 mg via ORAL
  Filled 2020-09-16: qty 1

## 2020-09-16 MED ORDER — ONDANSETRON HCL 4 MG/2ML IJ SOLN: 4.0000 mg | Freq: Four times a day (QID) | INTRAMUSCULAR | Status: DC | PRN

## 2020-09-16 MED ORDER — GUAIFENESIN-DM 100-10 MG/5ML PO SYRP: 15.0000 mL | ORAL_SOLUTION | ORAL | Status: DC | PRN

## 2020-09-16 MED ORDER — SENNOSIDES-DOCUSATE SODIUM 8.6-50 MG PO TABS: 1.0000 | ORAL_TABLET | Freq: Every evening | ORAL | Status: DC | PRN

## 2020-09-16 MED ORDER — ONDANSETRON HCL 4 MG/2ML IJ SOLN: 4.0000 mg | Freq: Once | INTRAMUSCULAR | Status: DC | PRN

## 2020-09-16 SURGICAL SUPPLY — 47 items
BAG DECANTER FOR FLEXI CONT (MISCELLANEOUS) ×3 IMPLANT
CANISTER SUCT 3000ML PPV (MISCELLANEOUS) ×3 IMPLANT
CATH ROBINSON RED A/P 18FR (CATHETERS) ×3 IMPLANT
CLIP VESOCCLUDE MED 24/CT (CLIP) ×3 IMPLANT
CLIP VESOCCLUDE SM WIDE 24/CT (CLIP) ×3 IMPLANT
COVER PROBE W GEL 5X96 (DRAPES) ×3 IMPLANT
COVER WAND RF STERILE (DRAPES) ×1 IMPLANT
DERMABOND ADVANCED (GAUZE/BANDAGES/DRESSINGS) ×2
DERMABOND ADVANCED .7 DNX12 (GAUZE/BANDAGES/DRESSINGS) ×1 IMPLANT
DRAIN CHANNEL 15F RND FF W/TCR (WOUND CARE) IMPLANT
ELECT REM PT RETURN 9FT ADLT (ELECTROSURGICAL) ×3
ELECTRODE REM PT RTRN 9FT ADLT (ELECTROSURGICAL) ×1 IMPLANT
EVACUATOR SILICONE 100CC (DRAIN) IMPLANT
GLOVE BIO SURGEON STRL SZ7.5 (GLOVE) ×3 IMPLANT
GOWN STRL REUS W/ TWL LRG LVL3 (GOWN DISPOSABLE) ×2 IMPLANT
GOWN STRL REUS W/ TWL XL LVL3 (GOWN DISPOSABLE) ×1 IMPLANT
GOWN STRL REUS W/TWL LRG LVL3 (GOWN DISPOSABLE) ×4
GOWN STRL REUS W/TWL XL LVL3 (GOWN DISPOSABLE) ×2
HEMOSTAT SNOW SURGICEL 2X4 (HEMOSTASIS) ×4 IMPLANT
INSERT FOGARTY SM (MISCELLANEOUS) IMPLANT
IV ADAPTER SYR DOUBLE MALE LL (MISCELLANEOUS) IMPLANT
KIT BASIN OR (CUSTOM PROCEDURE TRAY) ×3 IMPLANT
KIT SHUNT ARGYLE CAROTID ART 6 (VASCULAR PRODUCTS) ×3 IMPLANT
KIT TURNOVER KIT B (KITS) ×3 IMPLANT
NDL HYPO 25GX1X1/2 BEV (NEEDLE) IMPLANT
NDL SPNL 20GX3.5 QUINCKE YW (NEEDLE) IMPLANT
NEEDLE HYPO 25GX1X1/2 BEV (NEEDLE) IMPLANT
NEEDLE SPNL 20GX3.5 QUINCKE YW (NEEDLE) IMPLANT
NS IRRIG 1000ML POUR BTL (IV SOLUTION) ×9 IMPLANT
PACK CAROTID (CUSTOM PROCEDURE TRAY) ×3 IMPLANT
PAD ARMBOARD 7.5X6 YLW CONV (MISCELLANEOUS) ×6 IMPLANT
PATCH VASC XENOSURE 1CMX6CM (Vascular Products) ×2 IMPLANT
PATCH VASC XENOSURE 1X6 (Vascular Products) IMPLANT
POSITIONER HEAD DONUT 9IN (MISCELLANEOUS) ×3 IMPLANT
STOPCOCK 4 WAY LG BORE MALE ST (IV SETS) IMPLANT
SUT ETHILON 3 0 PS 1 (SUTURE) IMPLANT
SUT MNCRL AB 4-0 PS2 18 (SUTURE) ×3 IMPLANT
SUT PROLENE 6 0 BV (SUTURE) ×5 IMPLANT
SUT SILK 3 0 (SUTURE)
SUT SILK 3-0 18XBRD TIE 12 (SUTURE) IMPLANT
SUT VIC AB 3-0 SH 27 (SUTURE) ×2
SUT VIC AB 3-0 SH 27X BRD (SUTURE) ×1 IMPLANT
SYR BULB IRRIG 60ML STRL (SYRINGE) ×2 IMPLANT
SYR CONTROL 10ML LL (SYRINGE) IMPLANT
TOWEL GREEN STERILE (TOWEL DISPOSABLE) ×3 IMPLANT
TUBING ART PRESS 48 MALE/FEM (TUBING) IMPLANT
WATER STERILE IRR 1000ML POUR (IV SOLUTION) ×3 IMPLANT

## 2020-09-16 NOTE — Progress Notes (Signed)
ANTICOAGULATION CONSULT NOTE  Pharmacy Consult for Heparin Indication: atrial fibrillation  No Known Allergies  Patient Measurements: Height: 5\' 10"  (177.8 cm) Weight: 72.6 kg (160 lb) (Actual weight not obtained per neurology d/t pt not tPa candidate) IBW/kg (Calculated) : 73 Heparin Dosing Weight: 72.6 kg  Vital Signs: Temp: 97.9 F (36.6 C) (11/04 0803) Temp Source: Oral (11/04 0803) BP: 119/62 (11/04 0803) Pulse Rate: 60 (11/04 0803)  Labs: Recent Labs    09/14/20 0129 09/14/20 0129 09/14/20 1355 09/14/20 1355 09/14/20 1408 09/16/20 0557  HGB 12.0*   < > 13.1   < > 12.2* 12.0*  HCT 34.5*   < > 38.0*  --  36.0* 34.6*  PLT 161  --  167  --   --  165  APTT  --   --  41*  --   --  98*  LABPROT  --   --  14.4  --   --   --   INR  --   --  1.2  --   --   --   CREATININE 1.02  --  1.12  --  1.10  --    < > = values in this interval not displayed.    Estimated Creatinine Clearance: 55 mL/min (by C-G formula based on SCr of 1.1 mg/dL).   Medical History: Past Medical History:  Diagnosis Date  . GERD (gastroesophageal reflux disease)   . Hypertension     Assessment: 79 y.o. M recently admitted 10/30-11/2/21 after presenting with slurred speech concerning for TIA/CVA and started on Eliquis 5 mg BID for atrial fibrillation per neurology. Patient returned to ED on 09/14/20 after having recurrence of symptoms. Last dose of Eliquis 09/15/20 at 1315.   Pt was started on heparin 11/4 0100 with plans for L CEA today.  aPTT utilized for heparin dosing given recent apixaban use.  aPTT near upper end of goal.  No bleeding noted.  Goal of Therapy:  Heparin level 0.3-0.7 units/ml aPTT 66-102 seconds Monitor platelets by anticoagulation protocol: Yes   Plan:  Reduce heparin to 1000 units/hr  OR scheduled for 11AM Follow-up anticoag plans after OR.   13/4, Pharm.D., BCPS Clinical Pharmacist Clinical phone for 09/16/2020 from 8:30-4:00 is [phone].  **Pharmacist  phone directory can be found on [email or web address] listed under [facility].  09/16/2020 9:34 AM

## 2020-09-16 NOTE — Anesthesia Postprocedure Evaluation (Signed)
Anesthesia Post Note  Patient: Wardell Pokorski  Procedure(s) Performed: LEFT ENDARTERECTOMY CAROTID (Left Neck)     Patient location during evaluation: PACU Anesthesia Type: General Level of consciousness: awake Pain management: pain level controlled Vital Signs Assessment: post-procedure vital signs reviewed and stable Respiratory status: spontaneous breathing, nonlabored ventilation, respiratory function stable and patient connected to nasal cannula oxygen Cardiovascular status: blood pressure returned to baseline and stable Postop Assessment: no apparent nausea or vomiting Anesthetic complications: no   No complications documented.  Last Vitals:  Vitals:   09/16/20 1520 09/16/20 1556  BP: (!) 117/52 130/66  Pulse: 60   Resp: 13   Temp:  36.6 C  SpO2: 95%     Last Pain:  Vitals:   09/16/20 1556  TempSrc: Oral  PainSc:                  Tarrence Enck P Zander Ingham

## 2020-09-16 NOTE — Progress Notes (Signed)
SLP Cancellation Note  Patient Details Name: Elek Holderness MRN: [id number] DOB: 1941/04/11   Cancelled treatment:       Reason Eval/Treat Not Completed: Patient at procedure or test/unavailable; pt in surgery when SLP attempted evaluation; will continue efforts.   Tressie Stalker, M.S., CCC-SLP 09/16/2020, 12:06 PM

## 2020-09-16 NOTE — Progress Notes (Signed)
Spoke to [place] with [employer] about patients pacemaker. According to Arlys John, nothing needs to be done to the pacemaker. Dr. Desmond Lope was informed as well.

## 2020-09-16 NOTE — Progress Notes (Signed)
PROGRESS NOTE    [name]  ZOX:[id number] DOB: 27-Oct-1941 DOA: 09/14/2020 PCP: Patient, No Pcp Per   Chief Complain: Slurred speech  Brief Narrative: Patient is a 79 year old male with history of arrhythmia status post pacemaker placement, hypertension, GERD who was just discharged from here after management of TIA when he presented with slurred speech.  He presented with same complaint this time.  On his last admission CTA head and neck was negative for large vessel occlusion or high-grade stenosis but showed left ICA plaque.  His pacemaker was interrogated on last admission which shows possible atrial flutter and was started on Eliquis.  As soon as he went home, he started having symptoms of slurred speech and presented back to the emergency department.  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  Vascular surgery was consulted at this time, now the plan is for left carotid endarterectomy today.  Assessment & Plan:   Active Problems:   Presence of permanent cardiac pacemaker   Essential hypertension   TIA (transient ischemic attack)   Slurred speech/TIA:  On his last admission CTA head and neck was negative for large vessel occlusion or high-grade stenosis but showed left ICA plaque.  His pacemaker was interrogated on last admission which shows possible atrial flutter and was started on Eliquis.  As soon as he went home, he started having symptoms of slurred speech and presented back to the emergency department.  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  Vascular surgery was consulted at this time, now the plan is for left carotid endarterectomy today. Neurology following.  PT/OT did not recommend any follow-up on last admission. Echocardiogram on last  admission showed ejection fraction of 60% with mild left ventricular hypertrophy. LDL of 58, hemoglobin A1C of 5.4.  Continue Lipitor. EEG was done for the suspicion of seizures but did not show any epileptiform activities or seizures. His speech is  clear now.  He does not have any focal neurological deficits.  History of arrhythmia: Follows with [facility]ologist at [facility].  Has pacemaker.  As per his cardiologist, he has history of minimal burden A. fib so was not put on any anticoagulation.  Eliquis started on last admission, currently on hold for surgery..  Hypertension: Monitor blood pressure.  Currently blood pressure stable.  Antihypertensives on hold.           DVT prophylaxis:eliquis Code Status: Full Family Communication: Wife at bedside Status is: Inpatient    Dispo: The patient is from: Home              Anticipated d/c is to: Home              Anticipated d/c date is: 1 day              Patient currently is not medically stable to d/c.     Consultants: Vascular surgery, neurology  Procedures: None  Antimicrobials:  Anti-infectives (From admission, onward)   None      Subjective: Patient seen and examined at the bedside this morning.  Hemodynamically stable.  Comfortable without any new complaints.  Waiting for surgery  Objective: Vitals:   09/15/20 1730 09/15/20 2107 09/16/20 0051 09/16/20 0803  BP: (!) 129/59 123/60 128/75 119/62  Pulse: 60 78  64 60  Resp:  Temp: 98 F (36.7 C) 98.2 F (36.8 C) 98.3 F (36.8 C) 97.9 F (36.6 C)  TempSrc: Oral Oral Oral Oral  SpO2: 100% 98%    Weight:      Height:        Intake/Output Summary (Last 24 hours) at 09/16/2020 0848 Last data filed at 09/16/2020 1610 Gross per 24 hour  Intake 52.5 ml  Output 2300 ml  Net -2247.5 ml   Filed Weights   09/14/20 1428  Weight: 72.6 kg    Examination:   General exam: Appears calm and comfortable ,Not in distress,average built HEENT:PERRL,Oral mucosa  moist, Ear/Nose normal on gross exam Respiratory system: Bilateral equal air entry, normal vesicular breath sounds, no wheezes or crackles  Cardiovascular system: S1 & S2 heard, RRR. No JVD, murmurs, rubs, gallops or clicks. Gastrointestinal system: Abdomen is nondistended, soft and nontender. No organomegaly or masses felt. Normal bowel sounds heard. Central nervous system: Alert and oriented. No focal neurological deficits. Extremities: No edema, no clubbing ,no cyanosis Skin: No rashes, lesions or ulcers,no icterus ,no pallor  Data Reviewed: I have personally reviewed following labs and imaging studies  CBC: Recent Labs  Lab 09/11/20 2237 09/11/20 2243 09/13/20 0629 09/14/20 0129 09/14/20 1355 09/14/20 1408 09/16/20 0557  WBC 4.1  --  4.2 4.1 5.3  --  5.2  NEUTROABS 2.0  --   --   --  2.6  --   --   HGB 10.9*   < > 11.6* 12.0* 13.1 12.2* 12.0*  HCT 31.5*   < > 33.7* 34.5* 38.0* 36.0* 34.6*  MCV 98.4  --  95.7 95.3 98.4  --  96.6  PLT 156  --  148* 161 167  --  165   < > = values in this interval not displayed.   Basic Metabolic Panel: Recent Labs  Lab 09/11/20 2237 09/11/20 2237 09/11/20 2243 09/13/20 0629 09/14/20 0129 09/14/20 1355 09/14/20 1408  NA 135   < > 139 138 138 137 137  K 4.0   < > 4.0 4.1 4.2 4.2 4.1  CL 105   < > 106 107 105 105 103  CO2 23  --   --  --   GLUCOSE 114*   < > 112* 96 108* 89 81  BUN 30*   < > 29* 21 21 24* 25*  CREATININE 1.21   < > 1.20 1.05 1.02 1.12 1.10  CALCIUM 8.5*  --   --  8.8* 9.1 9.2  --   MG  --   --   --  2.0 1.9  --   --    < > = values in this interval not displayed.   GFR: Estimated Creatinine Clearance: 55 mL/min (by C-G formula based on SCr of 1.1 mg/dL). Liver Function Tests: Recent Labs  Lab 09/11/20 2237 09/14/20 1355  AST 24 31  ALT 23 27  ALKPHOS 86 91  BILITOT 0.9 0.9  PROT 5.7* 6.7  ALBUMIN 3.4* 3.7   No results for input(s): LIPASE, AMYLASE in the last 168 hours. No results for input(s):  AMMONIA in the last 168 hours. Coagulation Profile: Recent Labs  Lab 09/11/20 2237 09/14/20 1355  INR 1.1 1.2   Cardiac Enzymes: No results for input(s): CKTOTAL, CKMB, CKMBINDEX, TROPONINI in the last 168 hours. BNP (last 3 results) No results for input(s): PROBNP in the last 8760 hours. HbA1C: No results for input(s): HGBA1C in  the last 72 hours. CBG: Recent Labs  Lab 09/11/20 2150 09/14/20 1354 09/15/20 2110  GLUCAP 106* 86 103*   Lipid Profile: No results for input(s): CHOL, HDL, LDLCALC, TRIG, CHOLHDL, LDLDIRECT in the last 72 hours. Thyroid Function Tests: No results for input(s): TSH, T4TOTAL, FREET4, T3FREE, THYROIDAB in the last 72 hours. Anemia Panel: No results for input(s): VITAMINB12, FOLATE, FERRITIN, TIBC, IRON, RETICCTPCT in the last 72 hours. Sepsis Labs: No results for input(s): PROCALCITON, LATICACIDVEN in the last 168 hours.  Recent Results (from the past 240 hour(s))  Respiratory Panel by RT PCR (Flu A&B, Covid) - Nasopharyngeal Swab     Status: None   Collection Time: 09/12/20 11:28 AM   Specimen: Nasopharyngeal Swab  Result Value Ref Range Status   SARS Coronavirus 2 by RT PCR NEGATIVE NEGATIVE Final    Comment: (NOTE) SARS-CoV-2 target nucleic acids are NOT DETECTED.  The SARS-CoV-2 RNA is generally detectable in upper respiratoy specimens during the acute phase of infection. The lowest concentration of SARS-CoV-2 viral copies this assay can detect is 131 copies/mL. A negative result does not preclude SARS-Cov-2 infection and should not be used as the sole basis for treatment or other patient management decisions. A negative result may occur with  improper specimen collection/handling, submission of specimen other than nasopharyngeal swab, presence of viral mutation(s) within the areas targeted by this assay, and inadequate number of viral copies (<131 copies/mL). A negative result must be combined with clinical observations, patient history,  and epidemiological information. The expected result is Negative.  Fact Sheet for Patients:  https://www.moore.com/  Fact Sheet for Healthcare Providers:  https://www.young.biz/  This test is no t yet approved or cleared by the [place] FDA and  has been authorized for detection and/or diagnosis of SARS-CoV-2 by FDA under an Emergency Use Authorization (EUA). This EUA will remain  in effect (meaning this test can be used) for the duration of the COVID-19 declaration under Section 564(b)(1) of the Act, 21 U.S.C. section 360bbb-3(b)(1), unless the authorization is terminated or revoked sooner.     Influenza A by PCR NEGATIVE NEGATIVE Final   Influenza B by PCR NEGATIVE NEGATIVE Final    Comment: (NOTE) The Xpert Xpress SARS-CoV-2/FLU/RSV assay is intended as an aid in  the diagnosis of influenza from Nasopharyngeal swab specimens and  should not be used as a sole basis for treatment. Nasal washings and  aspirates are unacceptable for Xpert Xpress SARS-CoV-2/FLU/RSV  testing.  Fact Sheet for Patients: https://www.moore.com/  Fact Sheet for Healthcare Providers: https://www.young.biz/  This test is not yet approved or cleared by the [place] FDA and  has been authorized for detection and/or diagnosis of SARS-CoV-2 by  FDA under an Emergency Use Authorization (EUA). This EUA will remain  in effect (meaning this test can be used) for the duration of the  Covid-19 declaration under Section 564(b)(1) of the Act, 21  U.S.C. section 360bbb-3(b)(1), unless the authorization is  terminated or revoked. Performed at [facility], [address], [place], [place] [address]   MRSA PCR Screening     Status: None   Collection Time: 09/15/20  6:03 PM   Specimen: Nasopharyngeal  Result Value Ref Range Status   MRSA by PCR NEGATIVE NEGATIVE Final    Comment:        The GeneXpert MRSA Assay  (FDA approved for NASAL specimens only), is one component of a comprehensive MRSA colonization surveillance program. It is not intended to diagnose MRSA infection nor to  guide or monitor treatment for MRSA infections. Performed at [facility], [address] [place], [place] [address]          Radiology Studies: EEG  Result Date: 09/15/2020 Charlsie Quest, MD     09/15/2020  9:58 AM Patient Name: Bonham Zingale MRN: [id number] Epilepsy Attending: Charlsie Quest Referring Physician/Provider: Dr. Londell Moh Date: 09/14/2020 Duration: 23.41 minutes Patient history: 79 year old male who presented with 15-minute episode of garbled speech as well as blank stare.  EEG to evaluate for seizures. Level of alertness: Awake,asleep AEDs during EEG study: None Technical aspects: This EEG study was done with scalp electrodes positioned according to the 10-20 International system of electrode placement. Electrical activity was acquired at a sampling rate of 500Hz  and reviewed with a high frequency filter of 70Hz  and a low frequency filter of 1Hz . EEG data were recorded continuously and digitally stored. Description: The posterior dominant rhythm consists of 9 Hz activity of moderate voltage (25-35 uV) seen predominantly in posterior head regions, symmetric and reactive to eye opening and eye closing. Sleep was characterized by vertex waves, sleep spindles (12 to 14 Hz), maximal frontocentral region. Hyperventilation and photic stimulation were not performed.   IMPRESSION: This study is within normal limits. No seizures or epileptiform discharges were seen throughout the recording.   CT Code Stroke CTA Head W/WO contrast  Result Date: 09/14/2020 CLINICAL DATA:  Aphasia EXAM: CT ANGIOGRAPHY HEAD AND NECK TECHNIQUE: Multidetector CT imaging of the head and neck was performed using the standard protocol during bolus administration of intravenous contrast. Multiplanar CT image  reconstructions and MIPs were obtained to evaluate the vascular anatomy. Carotid stenosis measurements (when applicable) are obtained utilizing NASCET criteria, using the distal internal carotid diameter as the denominator. CONTRAST:  21mL OMNIPAQUE IOHEXOL 350 MG/ML SOLN COMPARISON:  09/12/2020 FINDINGS: CTA NECK Aortic arch: Great vessel origins are patent. Right carotid system: Patent. Calcified plaque at the ICA origin with less than 50% stenosis. Left carotid system: Patent. Calcified and irregular fibrofatty plaque with possible ulceration causing less than 50% stenosis. Vertebral arteries: Patent.  Left vertebral artery is dominant. Skeleton: Stable advanced degenerative changes of the cervical spine. Other neck: No new findings.  Right thyroid as previously described. Upper chest: No apical lung mass. Review of the MIP images confirms the above findings CTA HEAD Anterior circulation: Intracranial internal carotid arteries are patent with mild calcified plaque. Anterior cerebral arteries are patent with anterior communicating artery present. Left A1 ACA is dominant. Middle cerebral arteries are patent. Posterior circulation: Intracranial vertebral arteries are patent with mild calcified plaque on the left. Patent PICA origins. Basilar artery is patent. Superior cerebellar artery origins are patent. Posterior cerebral arteries are patent. Small left posterior communicating artery is present. Venous sinuses: Patent as allowed by contrast bolus timing. Review of the MIP images confirms the above findings IMPRESSION: No significant change since 09/12/2020. No new large vessel occlusion or hemodynamically significant stenosis. 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 Electronically Signed   By: 72m M.D.   On: 09/14/2020 16:08   CT Code Stroke CTA Neck W/WO contrast  Result Date: 09/14/2020 CLINICAL DATA:  Aphasia EXAM: CT ANGIOGRAPHY HEAD  AND NECK TECHNIQUE: Multidetector CT imaging of the head and neck was performed using the standard protocol during bolus administration of intravenous contrast. Multiplanar CT image reconstructions and MIPs were obtained to evaluate the vascular anatomy. Carotid stenosis measurements (  when applicable) are obtained utilizing NASCET criteria, using the distal internal carotid diameter as the denominator. CONTRAST:  75mL OMNIPAQUE IOHEXOL 350 MG/ML SOLN COMPARISON:  09/12/2020 FINDINGS: CTA NECK Aortic arch: Great vessel origins are patent. Right carotid system: Patent. Calcified plaque at the ICA origin with less than 50% stenosis. Left carotid system: Patent. Calcified and irregular fibrofatty plaque with possible ulceration causing less than 50% stenosis. Vertebral arteries: Patent.  Left vertebral artery is dominant. Skeleton: Stable advanced degenerative changes of the cervical spine. Other neck: No new findings.  Right thyroid as previously described. Upper chest: No apical lung mass. Review of the MIP images confirms the above findings CTA HEAD Anterior circulation: Intracranial internal carotid arteries are patent with mild calcified plaque. Anterior cerebral arteries are patent with anterior communicating artery present. Left A1 ACA is dominant. Middle cerebral arteries are patent. Posterior circulation: Intracranial vertebral arteries are patent with mild calcified plaque on the left. Patent PICA origins. Basilar artery is patent. Superior cerebellar artery origins are patent. Posterior cerebral arteries are patent. Small left posterior communicating artery is present. Venous sinuses: Patent as allowed by contrast bolus timing. Review of the MIP images confirms the above findings IMPRESSION: No significant change since 09/12/2020. No new large vessel occlusion or hemodynamically significant stenosis. 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  Electronically Signed   By: Guadlupe SpanishPraneil  Patel M.D.   On: 09/14/2020 16:08   CT HEAD CODE STROKE WO CONTRAST  Result Date: 09/14/2020 CLINICAL DATA:  Code stroke.  Follow-up stroke presentation. EXAM: CT HEAD WITHOUT CONTRAST TECHNIQUE: Contiguous axial images were obtained from the base of the skull through the vertex without intravenous contrast. COMPARISON:  CT studies 09/12/2020 and 09/14/2019. FINDINGS: Brain: Age related atrophy. Chronic small-vessel ischemic changes of the cerebral hemispheric white matter. No sign of acute infarction, mass lesion, hemorrhage, hydrocephalus or extra-axial collection. Vascular: There is atherosclerotic calcification of the major vessels at the base of the brain. Skull: Negative Sinuses/Orbits: Clear/normal Other: None ASPECTS ([place] Stroke Program Early CT Score) - Ganglionic level infarction (caudate, lentiform nuclei, internal capsule, insula, M1-M3 cortex): 7 - Supraganglionic infarction (M4-M6 cortex): 3 Total score (0-10 with 10 being normal): 10 IMPRESSION: 1. No acute finding by CT. Age related atrophy and chronic small-vessel ischemic changes of the white matter. 2. ASPECTS is 10. Electronically Signed   By: Paulina FusiMark  Shogry M.D.   On: 09/14/2020 14:05        Scheduled Meds: .  stroke: mapping our early stages of recovery book   Does not apply Once  . acetaminophen  1,000 mg Oral Once  . atorvastatin  40 mg Oral Daily  . budesonide  0.25 mg Nebulization BID  . cholecalciferol  1,000 Units Oral QHS  . sodium chloride flush  3 mL Intravenous Once   Continuous Infusions: . heparin 1,050 Units/hr (09/16/20 0109)     LOS: 1 day    Time spent: 25 mins.More than 50% of that time was spent in counseling and/or coordination of care.      Burnadette PopAmrit Zahriah Roes, MD [facility] P11/02/2020, 8:48 AM

## 2020-09-16 NOTE — Progress Notes (Signed)
Dr. Randie Heinz questioned regarding Heparin drip. New order received and carried out to turn Heparin off at this time prior to surgery.

## 2020-09-16 NOTE — Discharge Instructions (Signed)
   [facility]  Discharge Instructions   Carotid Endarterectomy (CEA)  Please refer to the following instructions for your post-procedure care. Your surgeon or physician assistant will discuss any changes with you.  Activity  You are encouraged to walk as much as you can. You can slowly return to normal activities but must avoid strenuous activity and heavy lifting until your doctor tell you it's OK. Avoid activities such as vacuuming or swinging a golf club. You can drive after one week if you are comfortable and you are no longer taking prescription pain medications. It is normal to feel tired for serval weeks after your surgery. It is also normal to have difficulty with sleep habits, eating, and bowel movements after surgery. These will go away with time.  Bathing/Showering  You may shower after you come home. Do not soak in a bathtub, hot tub, or swim until the incision heals completely.  Incision Care  Shower every day. Clean your incision with mild soap and water. Pat the area dry with a clean towel. You do not need a bandage unless otherwise instructed. Do not apply any ointments or creams to your incision. You may have skin glue on your incision. Do not peel it off. It will come off on its own in about one week. Your incision may feel thickened and raised for several weeks after your surgery. This is normal and the skin will soften over time. For Men Only: It's OK to shave around the incision but do not shave the incision itself for 2 weeks. It is common to have numbness under your chin that could last for several months.  Diet  Resume your normal diet. There are no special food restrictions following this procedure. A low fat/low cholesterol diet is recommended for all patients with vascular disease. In order to heal from your surgery, it is CRITICAL to get adequate nutrition. Your body requires vitamins, minerals, and protein. Vegetables are the best  source of vitamins and minerals. Vegetables also provide the perfect balance of protein. Processed food has little nutritional value, so try to avoid this.        Medications  Resume taking all of your medications unless your doctor or physician assistant tells you not to. If your incision is causing pain, you may take over-the- counter pain relievers such as acetaminophen (Tylenol). If you were prescribed a stronger pain medication, please be aware these medications can cause nausea and constipation. Prevent nausea by taking the medication with a snack or meal. Avoid constipation by drinking plenty of fluids and eating foods with a high amount of fiber, such as fruits, vegetables, and grains. Do not take Tylenol if you are taking prescription pain medications.  Follow Up  Our office will schedule a follow up appointment 2-3 weeks following discharge.  Please call us immediately for any of the following conditions  Increased pain, redness, drainage (pus) from your incision site. Fever of 101 degrees or higher. If you should develop stroke (slurred speech, difficulty swallowing, weakness on one side of your body, loss of vision) you should call 911 and go to the nearest emergency room.  Reduce your risk of vascular disease:  Stop smoking. If you would like help call [facility] at 1-800-QUIT-NOW (1-800-784-8669) or [facility] at 336-586-4000. Manage your cholesterol Maintain a desired weight Control your diabetes Keep your blood pressure down  If you have any questions, please call the office at [phone].   

## 2020-09-16 NOTE — Anesthesia Procedure Notes (Signed)
Arterial Line Insertion Start/End11/02/2020 10:55 AM, 09/16/2020 11:05 AM Performed by: Tillman Abide, CRNA, CRNA  Preanesthetic checklist: patient identified, IV checked, site marked, risks and benefits discussed, surgical consent, monitors and equipment checked, pre-op evaluation, timeout performed and anesthesia consent Right, radial was placed Catheter size: 20 G Hand hygiene performed , maximum sterile barriers used  and Seldinger technique used Allen's test indicative of satisfactory collateral circulation Attempts: 1 Procedure performed without using ultrasound guided technique. Ultrasound Notes:anatomy identified, needle tip was noted to be adjacent to the nerve/plexus identified and no ultrasound evidence of intravascular and/or intraneural injection Following insertion, dressing applied and Biopatch. Post procedure assessment: normal  Patient tolerated the procedure well with no immediate complications.

## 2020-09-16 NOTE — Transfer of Care (Signed)
Immediate Anesthesia Transfer of Care Note  Patient: [name]  Procedure(s) Performed: LEFT ENDARTERECTOMY CAROTID (Left Neck)  Patient Location: PACU  Anesthesia Type:General  Level of Consciousness: awake, alert , oriented and patient cooperative  Airway & Oxygen Therapy: Patient Spontanous Breathing and Patient connected to face mask oxygen  Post-op Assessment: Report given to RN, Post -op Vital signs reviewed and stable and Patient moving all extremities  Post vital signs: Reviewed and stable  Last Vitals:  Vitals Value Taken Time  BP 129/52 09/16/20 1350  Temp 36.4 C 09/16/20 1350  Pulse 60 09/16/20 1353  Resp 15 09/16/20 1353  SpO2 99 % 09/16/20 1353  Vitals shown include unvalidated device data.  Last Pain:  Vitals:   09/16/20 0803  TempSrc: Oral  PainSc:          Complications: No complications documented.

## 2020-09-16 NOTE — Progress Notes (Signed)
Patient arrived to 4E09 from PACU. VSS, incision c/d/i and NIH 0. Education provided to patient about NIH Stroke Scale and frequent assessments. Patient oriented to room and equipment. Bed low and locked, and call bell placed within reach of patient. Will continue to monitor closely.   Allegra Grana RN

## 2020-09-16 NOTE — Progress Notes (Signed)
STROKE TEAM PROGRESS NOTE   INTERVAL HISTORY Wife at bedside. Pt lying in bed, no distress. AAO x 3. He had left carotid endarterectomy with bovine pericardial patch angioplasty performed by Dr Randie Heinz today. Procedure successful.   Vitals:   09/16/20 1450 09/16/20 1505 09/16/20 1520 09/16/20 1556  BP: (!) 124/50 (!) 123/52 (!) 117/52 130/66  Pulse: 65 63 60   Resp: 20 20 13    Temp:    97.8 F (36.6 C)  TempSrc:    Oral  SpO2: 95% 94% 95%   Weight:      Height:       CBC:  Recent Labs  Lab 09/11/20 2237 09/11/20 2243 09/14/20 1355 09/14/20 1355 09/14/20 1408 09/16/20 0557  WBC 4.1   < > 5.3  --   --  5.2  NEUTROABS 2.0  --  2.6  --   --   --   HGB 10.9*   < > 13.1   < > 12.2* 12.0*  HCT 31.5*   < > 38.0*   < > 36.0* 34.6*  MCV 98.4   < > 98.4  --   --  96.6  PLT 156   < > 167  --   --  165   < > = values in this interval not displayed.   Basic Metabolic Panel:  Recent Labs  Lab 09/13/20 0629 09/13/20 0629 09/14/20 0129 09/14/20 0129 09/14/20 1355 09/14/20 1408  NA 138   < > 138   < > 137 137  K 4.1   < > 4.2   < > 4.2 4.1  CL 107   < > 105   < > 105 103  CO2 24   < > 25  --  24  --   GLUCOSE 96   < > 108*   < > 89 81  BUN 21   < > 21   < > 24* 25*  CREATININE 1.05   < > 1.02   < > 1.12 1.10  CALCIUM 8.8*   < > 9.1  --  9.2  --   MG 2.0  --  1.9  --   --   --    < > = values in this interval not displayed.   Lipid Panel:  Recent Labs  Lab 09/12/20 0500  CHOL 110  TRIG 80  HDL 36*  CHOLHDL 3.1  VLDL 16  LDLCALC 58   HgbA1c:  Recent Labs  Lab 09/12/20 0500  HGBA1C 5.4   Urine Drug Screen:  Recent Labs  Lab 09/12/20 0051  LABOPIA NONE DETECTED  COCAINSCRNUR NONE DETECTED  LABBENZ NONE DETECTED  AMPHETMU NONE DETECTED  THCU NONE DETECTED  LABBARB NONE DETECTED    Alcohol Level  Recent Labs  Lab 09/12/20 0110  ETH <10    IMAGING past 24 hours No results found.  PHYSICAL EXAM General - Well nourished, well developed, in no apparent  distress. Left neck wound dry clean  Ophthalmologic - fundi not visualized due to noncooperation.  Cardiovascular - Regular rhythm and rate.  Mental Status -  Level of arousal and orientation to time, place, and person were intact. Language including expression, naming, repetition, comprehension was assessed and found intact. Fund of Knowledge was assessed and was intact.  Cranial Nerves II - XII - II - Visual field intact OU. III, IV, VI - Extraocular movements intact. V - Facial sensation intact bilaterally. VII - Facial movement intact bilaterally. VIII - Hearing & vestibular intact bilaterally.  X - Palate elevates symmetrically. XI - Chin turning & shoulder shrug intact bilaterally. XII - Tongue protrusion intact.  Motor Strength - The patient's strength was normal in all extremities and pronator drift was absent.  Bulk was normal and fasciculations were absent.   Motor Tone - Muscle tone was assessed at the neck and appendages and was normal.  Reflexes - The patient's reflexes were symmetrical in all extremities and he had no pathological reflexes.  Sensory - Light touch, temperature/pinprick were assessed and were symmetrical.    Coordination - The patient had normal movements in the hands with no ataxia or dysmetria.  Tremor was absent.  Gait and Station - walking with PT in hallway, no fall tendency, chronic limp on the left side.    ASSESSMENT/PLAN [name] is a 79 y.o. male with history of HTN, GERD, bradycardia s/p pacemaker in 2016, L ICA plaque who was discharged from [facility]. [facility] earler that day after presenting with word finding difficulty. Dx w/ TIA and started on Eliquis for confirmed afib. After arrival home, he took a shower and had subsequent recurrent transient aphasia.   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 ASPECTS 10  CTA H&N repeat - stable. No  LVO or significant stenosis. Irregular L ICA plaque at origin, unchanged.   LDL - 58  HgbA1c - 5.4  UDS - negative  VTE prophylaxis - Eliquis  aspirin 81 mg daily prior to admission, on ASA 81mg  post left CEA.  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 on board  s/p left CEA 11/4  On ASA 81 post op  PAF/Aflutter Vtach SVT  A. fib listed in his medical problem list  [place] cardiologist in [place]. [place] contacted by Dr. Virgina Organ and confirmed that pt did have short run of afib but low burden in the past. No AC started   Pacemaker interrogation +SVT, short run of aflutter per Dr. Nelson Chimes    Asymptomatic Vtach 12-13 beats 10/31  Was put on eliquis, currently off pos op  Pt moved from [place] to [place] days ago, set up with Dr. [place] follow up as outpt.   Hypertension  Home BP meds: metoprolol 25 daily  BP stable   BP goal normotensive  Hyperlipidemia  Home Lipid lowering medication: pravastatin 40  LDL 58, goal < 70  Change to Lipitor 40 mg daily   Continue statin at discharge  Other Stroke Risk Factors  Advanced age  ETOH use, advised to drink no more than 2 alcoholic beverage per day.  Family hx strokes  Other Active Problems  Aortic Atherosclerosis   Bradycardia s/p pacer  GERD  Hospital day # 1  Lalla Brothers, MD PhD Stroke Neurology 09/16/2020 8:56 PM      To contact Stroke Continuity provider, please refer to 13/02/2020. After hours, contact General Neurology

## 2020-09-16 NOTE — Anesthesia Preprocedure Evaluation (Addendum)
Anesthesia Evaluation  Patient identified by MRN, date of birth, ID band Patient awake    Reviewed: Allergy & Precautions, NPO status , Patient's Chart, lab work & pertinent test results, reviewed documented beta blocker date and time   Airway Mallampati: II  TM Distance: >3 FB Neck ROM: Full    Dental  (+) Teeth Intact, Dental Advisory Given   Pulmonary neg pulmonary ROS,    Pulmonary exam normal breath sounds clear to auscultation       Cardiovascular hypertension, Pt. on home beta blockers Normal cardiovascular exam+ pacemaker  Rhythm:Regular Rate:Normal     Neuro/Psych TIAnegative psych ROS   GI/Hepatic Neg liver ROS, GERD  Medicated,  Endo/Other  negative endocrine ROS  Renal/GU negative Renal ROS     Musculoskeletal negative musculoskeletal ROS (+)   Abdominal   Peds  Hematology  (+) Blood dyscrasia (Eliquis), anemia ,   Anesthesia Other Findings Day of surgery medications reviewed with the patient.  Reproductive/Obstetrics                            Anesthesia Physical Anesthesia Plan  ASA: III  Anesthesia Plan: General   Post-op Pain Management:    Induction: Intravenous  PONV Risk Score and Plan: 2 and Dexamethasone and Ondansetron  Airway Management Planned: Oral ETT  Additional Equipment: Arterial line  Intra-op Plan:   Post-operative Plan: Extubation in OR  Informed Consent: I have reviewed the patients History and Physical, chart, labs and discussed the procedure including the risks, benefits and alternatives for the proposed anesthesia with the patient or authorized representative who has indicated his/her understanding and acceptance.       Plan Discussed with: CRNA  Anesthesia Plan Comments:         Anesthesia Quick Evaluation

## 2020-09-16 NOTE — Interval H&P Note (Signed)
History and Physical Interval Note:  09/16/2020 11:34 AM  [name]  has presented today for surgery, with the diagnosis of TIA.  The various methods of treatment have been discussed with the patient and family. After consideration of risks, benefits and other options for treatment, the patient has consented to  Procedure(s): LEFT ENDARTERECTOMY CAROTID (Left) as a surgical intervention.  The patient's history has been reviewed, patient examined, no change in status, stable for surgery.  I have reviewed the patient's chart and labs.  Questions were answered to the patient's satisfaction.     Lemar Livings

## 2020-09-16 NOTE — Progress Notes (Signed)
   Left neck incision clean and dry without hematoma Moving all 4 ext. No tongue deviation or facial droop.  S/P left CEA for symptomatic carotid stenosis   Mosetta Pigeon PA-C

## 2020-09-16 NOTE — Op Note (Signed)
    Patient name: [name] MRN: [id number] DOB: 1941-03-11 Sex: male  09/16/2020 Pre-operative Diagnosis: Symptomatic left ICA stenosis Post-operative diagnosis:  Same Surgeon:  Apolinar Junes C. Randie Heinz, MD Assistant: Clinton Gallant, PA Procedure Performed:  Left carotid endarterectomy with bovine pericardial patch angioplasty  Indications: 79 year old male admitted with 2 episodes of aphasia thought to be secondary to the left carotid artery what appeared to be friable plaque.  He is indicated for left carotid endarterectomy.  Assistant was necessary for suction, retraction, assistance with anastomosis and wound closure.  Findings: There is very friable appearing plaque right at the carotid bifurcation.  At completion there were expected signals with flow throughout diastole in the ICA distally patient was neurologically intact upon awakening.   Procedure:  The patient was identified in the holding area and taken to the operating was placed supine on operative table and general anesthesia was induced.  He was sterilely prepped and draped in the neck and chest in the usual fashion antibiotics were ministered a timeout was called.  Longitudinal incision was made along the anterior border the sternocleidomastoid.  We dissected down reflected the sternocleidomastoid laterally.  There were multiple vein branches in the neck these were divided between clips and ties.  We identified the common carotid artery we placed umbilical tape around this patient was fully heparinized at that time.  We then began dissecting through even more branches there was one large facial branch that was divided between ties.  Ultimately identified the hypoglossal nerve I was unable to protect this dissected out the external carotid artery.  His vessels were noted to be quite large at the bifurcation.  A vessel loop was placed around the external carotid artery.  We then dissected higher onto the internal carotid artery.  Where it looked  normal we placed a vessel loop around this.  The vessels were quite large we prepared a 10 [place] shunt.  We clamped the ICA followed by common carotid artery followed by ECA.  We opened the vessel longitudinally.  We irrigated with heparinized saline where there was friable plaque and suctioned this out.  I then attempted to place the 10 [place] shunt distally but I could not get it to pass more than 1 cm.  We did have pulsatile backbleeding and with this I elected not to shunt.  I reclamped the ICA again irrigated with heparinized saline.  I proceeded with endarterectomy including eversion of the external.  We had smooth tapering distally.  We irrigated the endarterectomy bed with heparinized saline.  We then prepared a bovine pericardial patch and sewed this in place with 6-0 Prolene suture.  Prior to completion we allowed flushing all directions.  Upon completion we then opened our clamp on the external followed by the common carotid artery and after several cardiac cycles the internal.  Then placed one repair stitch.  Doppler was used there was good signal in the ICA distally.  There was a high resistance waveform in the ECA.  50 mg of protamine was administered.  We obtain hemostasis irrigated the wound and closed in layers of Vicryl and Monocryl.  Patient was awakened from anesthesia he was neurologically intact in the operating room transferred to the recovery room in stable condition.  All counts were correct at completion.  EBL: 100 cc     Eilam Shrewsbury C. Randie Heinz, MD [facility] Office: [phone] Pager: [phone]

## 2020-09-16 NOTE — Anesthesia Procedure Notes (Signed)
Procedure Name: Intubation Date/Time: 09/16/2020 11:54 AM Performed by: Myna Bright, CRNA Pre-anesthesia Checklist: Patient identified, Emergency Drugs available, Suction available and Patient being monitored Patient Re-evaluated:Patient Re-evaluated prior to induction Oxygen Delivery Method: Circle system utilized Preoxygenation: Pre-oxygenation with 100% oxygen Induction Type: IV induction Ventilation: Mask ventilation without difficulty Laryngoscope Size: Mac and 4 Grade View: Grade I Tube type: Oral Tube size: 7.5 mm Number of attempts: 1 Airway Equipment and Method: Stylet Placement Confirmation: ETT inserted through vocal cords under direct vision,  positive ETCO2 and breath sounds checked- equal and bilateral Secured at: 22 cm Tube secured with: Tape Dental Injury: Teeth and Oropharynx as per pre-operative assessment

## 2020-09-17 ENCOUNTER — Encounter (HOSPITAL_COMMUNITY): Payer: Self-pay | Admitting: Vascular Surgery

## 2020-09-17 LAB — CBC
HCT: 31.8 % — ABNORMAL LOW (ref 39.0–52.0)
Hemoglobin: 11.2 g/dL — ABNORMAL LOW (ref 13.0–17.0)
MCH: 34.1 pg — ABNORMAL HIGH (ref 26.0–34.0)
MCHC: 35.2 g/dL (ref 30.0–36.0)
MCV: 97 fL (ref 80.0–100.0)
Platelets: 153 10*3/uL (ref 150–400)
RBC: 3.28 MIL/uL — ABNORMAL LOW (ref 4.22–5.81)
RDW: 13 % (ref 11.5–15.5)
WBC: 5.9 10*3/uL (ref 4.0–10.5)
nRBC: 0 % (ref 0.0–0.2)

## 2020-09-17 LAB — BASIC METABOLIC PANEL
Anion gap: 10 (ref 5–15)
BUN: 17 mg/dL (ref 8–23)
CO2: 22 mmol/L (ref 22–32)
Calcium: 8.6 mg/dL — ABNORMAL LOW (ref 8.9–10.3)
Chloride: 104 mmol/L (ref 98–111)
Creatinine, Ser: 0.99 mg/dL (ref 0.61–1.24)
GFR, Estimated: >60 mL/min (ref >60)
Glucose, Bld: 123 mg/dL — ABNORMAL HIGH (ref 70–99)
Potassium: 4.4 mmol/L (ref 3.5–5.1)
Sodium: 136 mmol/L (ref 135–145)

## 2020-09-17 LAB — POCT ACTIVATED CLOTTING TIME: Activated Clotting Time: 252 seconds

## 2020-09-17 MED ORDER — APIXABAN 5 MG PO TABS
5.0000 mg | ORAL_TABLET | Freq: Two times a day (BID) | ORAL | Status: DC
  Administered 2020-09-17: 5 mg via ORAL
  Filled 2020-09-17: qty 1

## 2020-09-17 MED ORDER — ASPIRIN EC 81 MG PO TBEC: 81.0000 mg | DELAYED_RELEASE_TABLET | Freq: Every day | ORAL | 1 refills | Status: AC

## 2020-09-17 MED ORDER — SENNOSIDES-DOCUSATE SODIUM 8.6-50 MG PO TABS: 1.0000 | ORAL_TABLET | Freq: Every evening | ORAL | Status: DC | PRN

## 2020-09-17 MED ORDER — ATORVASTATIN CALCIUM 40 MG PO TABS
40.0000 mg | ORAL_TABLET | Freq: Every day | ORAL | 1 refills | Status: DC
Start: 2020-09-18 — End: 2022-04-17

## 2020-09-17 NOTE — TOC Transition Note (Signed)
Transition of Care (TOC) - CM/SW Discharge Note Donn Pierini RN, BSN Transitions of Care Unit 4E- RN Case Manager See Treatment Team for direct phone #    Patient Details  Name: [name] MRN: [id number] Date of Birth: 09/15/1941  Transition of Care [facility]) CM/SW Contact:  Darrold Span, RN Phone Number: 09/17/2020, 11:57 AM   Clinical Narrative:    Pt stable for transition home today with wife. Per bedside RN pt will need RW for home, order placed and call made to [employer] line- RW to be delivered to room prior to discharge.  Notified by Tiffany with [facility] that they received a pre-op referral for any HH needs- they will f/u with pt post discharge.    Final next level of care: Home w Home Health Services Barriers to Discharge: No Barriers Identified   Patient Goals and CMS Choice Patient states their goals for this hospitalization and ongoing recovery are:: return home   Choice offered to / list presented to : NA  Discharge Placement                 Home with [facility]      Discharge Plan and Services   Discharge Planning Services: CM Consult Post Acute Care Choice: Durable Medical Equipment          DME Arranged: Walker rolling DME Agency: [employer] Date DME Agency Contacted: 09/17/20 Time DME Agency Contacted: [phone] Representative spoke with at DME Agency: Silvio Pate HH Arranged: RN HH Agency: [facility] Date HH Agency Contacted: 09/17/20 Time HH Agency Contacted: [phone] Representative spoke with at [facility] Agency: Tiffany  Social Determinants of Health (SDOH) Interventions     Readmission Risk Interventions Readmission Risk Prevention Plan 09/17/2020  Transportation Screening Complete  PCP or Specialist Appt within 5-7 Days Complete  Home Care Screening Complete  Medication Review (RN CM) Complete

## 2020-09-17 NOTE — Discharge Summary (Addendum)
Physician Discharge Summary  [name] GNF:[id number] DOB: 1941/03/07 DOA: 09/14/2020  PCP: Patient, No Pcp Per  Admit date: 09/14/2020 Discharge date: 09/17/2020  Admitted From: Home Disposition:  Home  Discharge Condition:Stable CODE STATUS:FULL Diet recommendation: Heart Healthy  Brief/Interim Summary:   Patient is a 79 year old male with history of arrhythmia status post pacemaker placement, hypertension, GERD who was just discharged from here after management of TIA when he presented with slurred speech.  He presented with same complaint this time.  On his last admission CTA head and neck was negative for large vessel occlusion or high-grade stenosis but showed left ICA plaque.  His pacemaker was interrogated on last admission which shows possible atrial flutter and was started on Eliquis.  As soon as he went home, he started having symptoms of slurred speech and presented back to the emergency department.  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  Vascular surgery was consulted at this time, now the plan is for left carotid endarterectomy on 09/16/20.  Patient is medically stable for discharge home today.  Following problems were addressed during his hospitalization:  Slurred speech/TIA:  On his last admission CTA head and neck was negative for large vessel occlusion or high-grade stenosis but showed left ICA plaque.  His pacemaker was interrogated on last admission which shows possible atrial flutter and was started on Eliquis.  As soon as he went home, he started having symptoms of slurred speech and presented back to the emergency department.  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  Vascular surgery was consulted and he underwent left carotid  endarterectomy . Neurology were following.  PT/OT did not recommend any follow-up on last admission. Echocardiogram on last admission showed ejection fraction of 60% with mild left ventricular hypertrophy. LDL of 58, hemoglobin A1C of 5.4.  Continue Lipitor. EEG was also done for the suspicion of seizures on last admission but did not show any epileptiform activities or seizures. His speech is  clear now.  He does not have any focal neurological deficits.Also started aspirin.  History of arrhythmia: Follows with cardiologist at [facility].  Has pacemaker.  As per his cardiologist, he has history of minimal burden A. fib so was not put on any anticoagulation.  Eliquis started on last admission  Hypertension: Continue blood pressure medication at home.     Discharge Diagnoses:  Active Problems:   Presence of permanent cardiac pacemaker   Essential hypertension   TIA (transient ischemic attack)    Discharge Instructions  Discharge Instructions    Diet - low sodium heart healthy   Complete by: As directed    Discharge instructions   Complete by: As directed    1) Please follow up with vascular surgery as an outpatient in 2 weeks.  Name and number the provider has been attached 2)Follow up with your PCP in a week. 3)Take prescribed medication as instructed.   Increase activity slowly   Complete by: As directed    No wound care   Complete by: As directed      Allergies as of 09/17/2020   No Known Allergies     Medication List    STOP taking these medications   pravastatin 40 MG tablet Commonly known as: PRAVACHOL  TAKE these medications   albuterol 108 (90 Base) MCG/ACT inhaler Commonly known as: VENTOLIN HFA Inhale 2 puffs into the lungs 4 (four) times daily as needed for wheezing or shortness of breath.   apixaban 5 MG Tabs tablet Commonly known as: ELIQUIS Take 1 tablet (5 mg total) by mouth 2 (two) times daily.   Arnuity Ellipta 200 MCG/ACT Aepb Generic  drug: Fluticasone Furoate Inhale 1 puff into the lungs every morning.   aspirin EC 81 MG tablet Take 1 tablet (81 mg total) by mouth daily. Swallow whole.   atorvastatin 40 MG tablet Commonly known as: LIPITOR Take 1 tablet (40 mg total) by mouth daily. Start taking on: September 18, 2020   cholecalciferol 25 MCG (1000 UNIT) tablet Commonly known as: VITAMIN D Take 1,000 Units by mouth at bedtime.   FISH OIL PO Take 1 capsule by mouth every morning.   Magnesium 250 MG Tabs Take 250 mg by mouth every morning.   metoprolol tartrate 50 MG tablet Commonly known as: LOPRESSOR Take 25 mg by mouth daily.   multivitamin with minerals Tabs tablet Take 1 tablet by mouth every morning.   omeprazole 40 MG capsule Commonly known as: PRILOSEC Take 40 mg by mouth daily before supper.            Durable Medical Equipment  (From admission, onward)         Start     Ordered   09/17/20 1053  For home use only DME Walker rolling  Once       Comments: Post op  Question Answer Comment  Walker: With 5 Inch Wheels   Patient needs a walker to treat with the following condition Weakness      09/17/20 1053          Follow-up Information    Maeola Harman, MD In 2 weeks.   Specialties: Vascular Surgery, Cardiology Why: Office will call you to arrange your appt (sent) Contact information: [address] [place] [address] [phone]        [facility] Follow up.   Why: rolling walker arranged- to be delivered to room prior to discharge Contact information[address] [place] [address] [phone]        [facility] Follow up.   Specialty: Home Health Services Why: Pre-op referral by vascular given for [facility] needs- they will call you post discharge to check in for any [facility] needs Contact information: [address] [place] [address] [phone]              No Known  Allergies  Consultations:  Neurology, vascular surgery   Procedures/Studies: EEG  Result Date: 09/15/2020 Charlsie Quest, MD     09/15/2020  9:58 AM Patient Name: [name] MRN: [id number] Epilepsy Attending: Charlsie Quest Referring Physician/Provider: Dr. Londell Moh Date: 09/14/2020 Duration: 23.41 minutes Patient history: 79 year old male who presented with 15-minute episode of garbled speech as well as blank stare.  EEG to evaluate for seizures. Level of alertness: Awake,asleep AEDs during EEG study: None Technical aspects: This EEG study was done with scalp electrodes positioned according to the 10-20 International system of electrode placement. Electrical activity was acquired at a sampling rate of 500Hz  and reviewed with a high frequency filter of 70Hz  and a low frequency filter of 1Hz . EEG data were recorded continuously and digitally stored. Description: The posterior dominant rhythm consists of 9 Hz activity of moderate voltage (25-35 uV) seen predominantly  in posterior head regions, symmetric and reactive to eye opening and eye closing. Sleep was characterized by vertex waves, sleep spindles (12 to 14 Hz), maximal frontocentral region. Hyperventilation and photic stimulation were not performed.   IMPRESSION: This study is within normal limits. No seizures or epileptiform discharges were seen throughout the recording. Charlsie Quest   CT Code Stroke CTA Head W/WO contrast  Result Date: 09/14/2020 CLINICAL DATA:  Aphasia EXAM: CT ANGIOGRAPHY HEAD AND NECK TECHNIQUE: Multidetector CT imaging of the head and neck was performed using the standard protocol during bolus administration of intravenous contrast. Multiplanar CT image reconstructions and MIPs were obtained to evaluate the vascular anatomy. Carotid stenosis measurements (when applicable) are obtained utilizing NASCET criteria, using the distal internal carotid diameter as the denominator. CONTRAST:  75mL OMNIPAQUE IOHEXOL 350 MG/ML  SOLN COMPARISON:  09/12/2020 FINDINGS: CTA NECK Aortic arch: Great vessel origins are patent. Right carotid system: Patent. Calcified plaque at the ICA origin with less than 50% stenosis. Left carotid system: Patent. Calcified and irregular fibrofatty plaque with possible ulceration causing less than 50% stenosis. Vertebral arteries: Patent.  Left vertebral artery is dominant. Skeleton: Stable advanced degenerative changes of the cervical spine. Other neck: No new findings.  Right thyroid as previously described. Upper chest: No apical lung mass. Review of the MIP images confirms the above findings CTA HEAD Anterior circulation: Intracranial internal carotid arteries are patent with mild calcified plaque. Anterior cerebral arteries are patent with anterior communicating artery present. Left A1 ACA is dominant. Middle cerebral arteries are patent. Posterior circulation: Intracranial vertebral arteries are patent with mild calcified plaque on the left. Patent PICA origins. Basilar artery is patent. Superior cerebellar artery origins are patent. Posterior cerebral arteries are patent. Small left posterior communicating artery is present. Venous sinuses: Patent as allowed by contrast bolus timing. Review of the MIP images confirms the above findings IMPRESSION: No significant change since 09/12/2020. No new large vessel occlusion or hemodynamically significant stenosis. 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 Electronically Signed   By: Guadlupe Spanish M.D.   On: 09/14/2020 16:08   CT Angio Head W or Wo Contrast  Result Date: 09/12/2020 CLINICAL DATA:  Stroke/TIA EXAM: CT ANGIOGRAPHY HEAD AND NECK TECHNIQUE: Multidetector CT imaging of the head and neck was performed using the standard protocol during bolus administration of intravenous contrast. Multiplanar CT image reconstructions and MIPs were obtained to evaluate the vascular anatomy. Carotid stenosis  measurements (when applicable) are obtained utilizing NASCET criteria, using the distal internal carotid diameter as the denominator. CONTRAST:  OMNIPAQUE IOHEXOL 350 MG/ML SOLN COMPARISON:  None. FINDINGS: CT HEAD FINDINGS Brain: There is no mass, hemorrhage or extra-axial collection. There is generalized atrophy without lobar predilection. There is hypoattenuation of the periventricular white matter, most commonly indicating chronic ischemic microangiopathy. Skull: The visualized skull base, calvarium and extracranial soft tissues are normal. Sinuses/Orbits: No fluid levels or advanced mucosal thickening of the visualized paranasal sinuses. No mastoid or middle ear effusion. The orbits are normal. CTA NECK FINDINGS SKELETON: There is no bony spinal canal stenosis. No lytic or blastic lesion. OTHER NECK: Enlarged heterogeneous right thyroid lobe. UPPER CHEST: No pneumothorax or pleural effusion. No nodules or masses. AORTIC ARCH: There is calcific atherosclerosis of the aortic arch. There is no aneurysm, dissection or hemodynamically significant stenosis of the visualized portion of the aorta. Conventional 3 vessel aortic branching pattern. The visualized proximal subclavian arteries are widely patent. RIGHT CAROTID  SYSTEM: No dissection, occlusion or aneurysm. There is calcified atherosclerosis extending into the proximal ICA, resulting in less than 50% stenosis. LEFT CAROTID SYSTEM: There is eccentric plaque at the left carotid bifurcation extending into the proximal left internal carotid artery. There is no hemodynamically significant stenosis. VERTEBRAL ARTERIES: Left dominant configuration. Both origins are clearly patent. There is no dissection, occlusion or flow-limiting stenosis to the skull base (V1-V3 segments). CTA HEAD FINDINGS POSTERIOR CIRCULATION: --Vertebral arteries: Normal V4 segments. --Inferior cerebellar arteries: Normal. --Basilar artery: Normal. --Superior cerebellar arteries: Normal.  --Posterior cerebral arteries (PCA): Normal. ANTERIOR CIRCULATION: --Intracranial internal carotid arteries: Atherosclerotic calcification of the internal carotid arteries at the skull base without hemodynamically significant stenosis. --Anterior cerebral arteries (ACA): Normal. Both A1 segments are present. Patent anterior communicating artery (a-comm). --Middle cerebral arteries (MCA): Normal. VENOUS SINUSES: As permitted by contrast timing, patent. ANATOMIC VARIANTS: None Review of the MIP images confirms the above findings. IMPRESSION: 1. No emergent large vessel occlusion or high-grade stenosis of the intracranial arteries. 2. Eccentric plaque within the proximal left internal carotid artery may be at increased risk of embolus formation. 3. Bilateral carotid bifurcation atherosclerosis without hemodynamically significant stenosis by NASCET criteria. 4. Enlarged heterogeneous right thyroid lobe. Recommend thyroid ultrasound on a nonemergent basis (ref: J Am Coll Radiol. 2015 Feb;12(2): 143-50). Aortic Atherosclerosis (ICD10-I70.0). Electronically Signed   By: Deatra Robinson M.D.   On: 09/12/2020 03:15   CT HEAD WO CONTRAST  Result Date: 09/13/2020 CLINICAL DATA:  Follow-up TIA EXAM: CT HEAD WITHOUT CONTRAST TECHNIQUE: Contiguous axial images were obtained from the base of the skull through the vertex without intravenous contrast. COMPARISON:  CT a of the head neck from yesterday FINDINGS: Brain: No evidence of acute infarction, hemorrhage, hydrocephalus, extra-axial collection or mass lesion/mass effect. Generalized atrophy and confluent chronic small vessel ischemic low-density in the white matter, especially about the frontal horns. Vascular: No hyperdense vessel or unexpected calcification. Skull: Normal. Negative for fracture or focal lesion. Sinuses/Orbits: Negative IMPRESSION: No hemorrhage or visible infarct. Aging brain. Electronically Signed   By: Marnee Spring M.D.   On: 09/13/2020 05:42   CT  Code Stroke CTA Neck W/WO contrast  Result Date: 09/14/2020 CLINICAL DATA:  Aphasia EXAM: CT ANGIOGRAPHY HEAD AND NECK TECHNIQUE: Multidetector CT imaging of the head and neck was performed using the standard protocol during bolus administration of intravenous contrast. Multiplanar CT image reconstructions and MIPs were obtained to evaluate the vascular anatomy. Carotid stenosis measurements (when applicable) are obtained utilizing NASCET criteria, using the distal internal carotid diameter as the denominator. CONTRAST:  67mL OMNIPAQUE IOHEXOL 350 MG/ML SOLN COMPARISON:  09/12/2020 FINDINGS: CTA NECK Aortic arch: Great vessel origins are patent. Right carotid system: Patent. Calcified plaque at the ICA origin with less than 50% stenosis. Left carotid system: Patent. Calcified and irregular fibrofatty plaque with possible ulceration causing less than 50% stenosis. Vertebral arteries: Patent.  Left vertebral artery is dominant. Skeleton: Stable advanced degenerative changes of the cervical spine. Other neck: No new findings.  Right thyroid as previously described. Upper chest: No apical lung mass. Review of the MIP images confirms the above findings CTA HEAD Anterior circulation: Intracranial internal carotid arteries are patent with mild calcified plaque. Anterior cerebral arteries are patent with anterior communicating artery present. Left A1 ACA is dominant. Middle cerebral arteries are patent. Posterior circulation: Intracranial vertebral arteries are patent with mild calcified plaque on the left. Patent PICA origins. Basilar artery is patent. Superior cerebellar artery origins are patent. Posterior cerebral arteries are  patent. Small left posterior communicating artery is present. Venous sinuses: Patent as allowed by contrast bolus timing. Review of the MIP images confirms the above findings IMPRESSION: No significant change since 09/12/2020. No new large vessel occlusion or hemodynamically significant  stenosis. 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 Electronically Signed   By: Guadlupe Spanish M.D.   On: 09/14/2020 16:08   CT Angio Neck W and/or Wo Contrast  Result Date: 09/12/2020 CLINICAL DATA:  Stroke/TIA EXAM: CT ANGIOGRAPHY HEAD AND NECK TECHNIQUE: Multidetector CT imaging of the head and neck was performed using the standard protocol during bolus administration of intravenous contrast. Multiplanar CT image reconstructions and MIPs were obtained to evaluate the vascular anatomy. Carotid stenosis measurements (when applicable) are obtained utilizing NASCET criteria, using the distal internal carotid diameter as the denominator. CONTRAST:  OMNIPAQUE IOHEXOL 350 MG/ML SOLN COMPARISON:  None. FINDINGS: CT HEAD FINDINGS Brain: There is no mass, hemorrhage or extra-axial collection. There is generalized atrophy without lobar predilection. There is hypoattenuation of the periventricular white matter, most commonly indicating chronic ischemic microangiopathy. Skull: The visualized skull base, calvarium and extracranial soft tissues are normal. Sinuses/Orbits: No fluid levels or advanced mucosal thickening of the visualized paranasal sinuses. No mastoid or middle ear effusion. The orbits are normal. CTA NECK FINDINGS SKELETON: There is no bony spinal canal stenosis. No lytic or blastic lesion. OTHER NECK: Enlarged heterogeneous right thyroid lobe. UPPER CHEST: No pneumothorax or pleural effusion. No nodules or masses. AORTIC ARCH: There is calcific atherosclerosis of the aortic arch. There is no aneurysm, dissection or hemodynamically significant stenosis of the visualized portion of the aorta. Conventional 3 vessel aortic branching pattern. The visualized proximal subclavian arteries are widely patent. RIGHT CAROTID SYSTEM: No dissection, occlusion or aneurysm. There is calcified atherosclerosis extending into the proximal ICA, resulting in less  than 50% stenosis. LEFT CAROTID SYSTEM: There is eccentric plaque at the left carotid bifurcation extending into the proximal left internal carotid artery. There is no hemodynamically significant stenosis. VERTEBRAL ARTERIES: Left dominant configuration. Both origins are clearly patent. There is no dissection, occlusion or flow-limiting stenosis to the skull base (V1-V3 segments). CTA HEAD FINDINGS POSTERIOR CIRCULATION: --Vertebral arteries: Normal V4 segments. --Inferior cerebellar arteries: Normal. --Basilar artery: Normal. --Superior cerebellar arteries: Normal. --Posterior cerebral arteries (PCA): Normal. ANTERIOR CIRCULATION: --Intracranial internal carotid arteries: Atherosclerotic calcification of the internal carotid arteries at the skull base without hemodynamically significant stenosis. --Anterior cerebral arteries (ACA): Normal. Both A1 segments are present. Patent anterior communicating artery (a-comm). --Middle cerebral arteries (MCA): Normal. VENOUS SINUSES: As permitted by contrast timing, patent. ANATOMIC VARIANTS: None Review of the MIP images confirms the above findings. IMPRESSION: 1. No emergent large vessel occlusion or high-grade stenosis of the intracranial arteries. 2. Eccentric plaque within the proximal left internal carotid artery may be at increased risk of embolus formation. 3. Bilateral carotid bifurcation atherosclerosis without hemodynamically significant stenosis by NASCET criteria. 4. Enlarged heterogeneous right thyroid lobe. Recommend thyroid ultrasound on a nonemergent basis (ref: J Am Coll Radiol. 2015 Feb;12(2): 143-50). Aortic Atherosclerosis (ICD10-I70.0). Electronically Signed   By: Deatra Robinson M.D.   On: 09/12/2020 03:15   ECHOCARDIOGRAM COMPLETE  Result Date: 09/12/2020    ECHOCARDIOGRAM REPORT   Patient Name:   DURIEL DEERY Date of Exam: 09/12/2020 Medical Rec #:  [id number]   Height:       70.0 in Accession #:    [id number]  Weight:  160.0 lb Date of Birth:   1940/12/25   BSA:          1.898 m Patient Age:    79 years    BP:           125/72 mmHg Patient Gender: M           HR:           60 bpm. Exam Location:  Inpatient Procedure: 2D Echo, Cardiac Doppler and Color Doppler Indications:    Stroke  History:        Patient has no prior history of Echocardiogram examinations.                 Pacemaker; Risk Factors:Hypertension. GERD.  Sonographer:    Ross Ludwig RDCS (AE) Referring Phys: [id number] VASUNDHRA RATHORE IMPRESSIONS  1. Left ventricular ejection fraction, by estimation, is 55 to 60%. The left ventricle has normal function. The left ventricle has no regional wall motion abnormalities. There is mild concentric left ventricular hypertrophy. Left ventricular diastolic parameters are indeterminate.  2. Right ventricular systolic function is normal. The right ventricular size is normal. There is normal pulmonary artery systolic pressure. The estimated right ventricular systolic pressure is 21.8 mmHg.  3. The mitral valve is normal in structure. Trivial mitral valve regurgitation. No evidence of mitral stenosis.  4. The aortic valve is tricuspid. Aortic valve regurgitation is mild. Mild aortic valve sclerosis is present, with no evidence of aortic valve stenosis.  5. Aortic dilatation noted. There is mild dilatation of the aortic root, measuring 39 mm.  6. The inferior vena cava is normal in size with greater than 50% respiratory variability, suggesting right atrial pressure of 3 mmHg. FINDINGS  Left Ventricle: Left ventricular ejection fraction, by estimation, is 55 to 60%. The left ventricle has normal function. The left ventricle has no regional wall motion abnormalities. The left ventricular internal cavity size was normal in size. There is  mild concentric left ventricular hypertrophy. Left ventricular diastolic parameters are indeterminate. Normal left ventricular filling pressure. Right Ventricle: The right ventricular size is normal. No increase in right  ventricular wall thickness. Right ventricular systolic function is normal. There is normal pulmonary artery systolic pressure. The tricuspid regurgitant velocity is 2.17 m/s, and  with an assumed right atrial pressure of 3 mmHg, the estimated right ventricular systolic pressure is 21.8 mmHg. Left Atrium: Left atrial size was normal in size. Right Atrium: Right atrial size was normal in size. Pericardium: There is no evidence of pericardial effusion. Mitral Valve: The mitral valve is normal in structure. Trivial mitral valve regurgitation. No evidence of mitral valve stenosis. Tricuspid Valve: The tricuspid valve is normal in structure. Tricuspid valve regurgitation is trivial. No evidence of tricuspid stenosis. Aortic Valve: The aortic valve is tricuspid. Aortic valve regurgitation is mild. Aortic regurgitation PHT measures 458 msec. Mild aortic valve sclerosis is present, with no evidence of aortic valve stenosis. Aortic valve mean gradient measures 4.0 mmHg. Aortic valve peak gradient measures 6.2 mmHg. Aortic valve area, by VTI measures 2.27 cm. Pulmonic Valve: The pulmonic valve was normal in structure. Pulmonic valve regurgitation is trivial. No evidence of pulmonic stenosis. Aorta: Aortic dilatation noted. There is mild dilatation of the aortic root, measuring 39 mm. Venous: The inferior vena cava is normal in size with greater than 50% respiratory variability, suggesting right atrial pressure of 3 mmHg. IAS/Shunts: No atrial level shunt detected by color flow Doppler.  LEFT VENTRICLE PLAX 2D LVIDd:  5.20 cm  Diastology LVIDs:         3.80 cm  LV e' medial:    5.77 cm/s LV PW:         1.20 cm  LV E/e' medial:  10.2 LV IVS:        1.30 cm  LV e' lateral:   10.70 cm/s LVOT diam:     2.10 cm  LV E/e' lateral: 5.5 LV SV:         65 LV SV Index:   34 LVOT Area:     3.46 cm  RIGHT VENTRICLE             IVC RV S prime:     15.20 cm/s  IVC diam: 1.30 cm TAPSE (M-mode): 2.4 cm LEFT ATRIUM           Index        RIGHT ATRIUM           Index LA diam:      4.10 cm 2.16 cm/m  RA Area:     21.50 cm LA Vol (A2C): 56.5 ml 29.76 ml/m RA Volume:   57.30 ml  30.18 ml/m LA Vol (A4C): 52.8 ml 27.81 ml/m  AORTIC VALVE AV Area (Vmax):    2.52 cm AV Area (Vmean):   2.08 cm AV Area (VTI):     2.27 cm AV Vmax:           124.00 cm/s AV Vmean:          93.100 cm/s AV VTI:            0.287 m AV Peak Grad:      6.2 mmHg AV Mean Grad:      4.0 mmHg LVOT Vmax:         90.30 cm/s LVOT Vmean:        56.000 cm/s LVOT VTI:          0.188 m LVOT/AV VTI ratio: 0.66 AI PHT:            458 msec  AORTA Ao Root diam: 3.90 cm Ao Asc diam:  3.60 cm MITRAL VALVE               TRICUSPID VALVE MV Area (PHT): 5.97 cm    TR Peak grad:   18.8 mmHg MV Decel Time: 127 msec    TR Vmax:        217.00 cm/s MV E velocity: 58.60 cm/s MV A velocity: 53.50 cm/s  SHUNTS MV E/A ratio:  1.10        Systemic VTI:  0.19 m                            Systemic Diam: 2.10 cm Armanda Magicraci Turner MD Electronically signed by Armanda Magicraci Turner MD Signature Date/Time: 09/12/2020/2:17:08 PM    Final    CT HEAD CODE STROKE WO CONTRAST  Result Date: 09/14/2020 CLINICAL DATA:  Code stroke.  Follow-up stroke presentation. EXAM: CT HEAD WITHOUT CONTRAST TECHNIQUE: Contiguous axial images were obtained from the base of the skull through the vertex without intravenous contrast. COMPARISON:  CT studies 09/12/2020 and 09/14/2019. FINDINGS: Brain: Age related atrophy. Chronic small-vessel ischemic changes of the cerebral hemispheric white matter. No sign of acute infarction, mass lesion, hemorrhage, hydrocephalus or extra-axial collection. Vascular: There is atherosclerotic calcification of the major vessels at the base of the brain. Skull: Negative Sinuses/Orbits: Clear/normal Other: None ASPECTS [facility]([place] Stroke Program Early  CT Score) - Ganglionic level infarction (caudate, lentiform nuclei, internal capsule, insula, M1-M3 cortex): 7 - Supraganglionic infarction (M4-M6 cortex): 3 Total score  (0-10 with 10 being normal): 10 IMPRESSION: 1. No acute finding by CT. Age related atrophy and chronic small-vessel ischemic changes of the white matter. 2. ASPECTS is 10. Electronically Signed   By: Paulina Fusi M.D.   On: 09/14/2020 14:05      Subjective: Patient seen and examined at the bedside this morning.  Hemodynamically stable for discharge.  Discharge Exam: Vitals:   09/17/20 0728 09/17/20 0759  BP: (!) 150/59   Pulse: 69   Resp: 18   Temp: 98.6 F (37 C)   SpO2: 100% 99%   Vitals:   09/17/20 0100 09/17/20 0400 09/17/20 0728 09/17/20 0759  BP: (!) 122/47 (!) 123/56 (!) 150/59   Pulse: 63 62 69   Resp: 12 11 18    Temp: 97.9 F (36.6 C) 97.6 F (36.4 C) 98.6 F (37 C)   TempSrc: Oral Oral Oral   SpO2:   100% 99%  Weight:      Height:        General: Pt is alert, awake, not in acute distress Cardiovascular: RRR, S1/S2 +, no rubs, no gallops Respiratory: CTA bilaterally, no wheezing, no rhonchi Abdominal: Soft, NT, ND, bowel sounds + Extremities: no edema, no cyanosis    The results of significant diagnostics from this hospitalization (including imaging, microbiology, ancillary and laboratory) are listed below for reference.     Microbiology: Recent Results (from the past 240 hour(s))  Respiratory Panel by RT PCR (Flu A&B, Covid) - Nasopharyngeal Swab     Status: None   Collection Time: 09/12/20 11:28 AM   Specimen: Nasopharyngeal Swab  Result Value Ref Range Status   SARS Coronavirus 2 by RT PCR NEGATIVE NEGATIVE Final    Comment: (NOTE) SARS-CoV-2 target nucleic acids are NOT DETECTED.  The SARS-CoV-2 RNA is generally detectable in upper respiratoy specimens during the acute phase of infection. The lowest concentration of SARS-CoV-2 viral copies this assay can detect is 131 copies/mL. A negative result does not preclude SARS-Cov-2 infection and should not be used as the sole basis for treatment or other patient management decisions. A negative result  may occur with  improper specimen collection/handling, submission of specimen other than nasopharyngeal swab, presence of viral mutation(s) within the areas targeted by this assay, and inadequate number of viral copies (<131 copies/mL). A negative result must be combined with clinical observations, patient history, and epidemiological information. The expected result is Negative.  Fact Sheet for Patients:  09/14/20  Fact Sheet for Healthcare Providers:  https://www.moore.com/  This test is no t yet approved or cleared by the https://www.young.biz/ FDA and  has been authorized for detection and/or diagnosis of SARS-CoV-2 by FDA under an Emergency Use Authorization (EUA). This EUA will remain  in effect (meaning this test can be used) for the duration of the COVID-19 declaration under Section 564(b)(1) of the Act, 21 U.S.C. section 360bbb-3(b)(1), unless the authorization is terminated or revoked sooner.     Influenza A by PCR NEGATIVE NEGATIVE Final   Influenza B by PCR NEGATIVE NEGATIVE Final    Comment: (NOTE) The Xpert Xpress SARS-CoV-2/FLU/RSV assay is intended as an aid in  the diagnosis of influenza from Nasopharyngeal swab specimens and  should not be used as a sole basis for treatment. Nasal washings and  aspirates are unacceptable for Xpert Xpress SARS-CoV-2/FLU/RSV  testing.  Fact Sheet for Patients: [place]  Fact Sheet for Healthcare Providers: https://www.young.biz/  This test is not yet approved or cleared by the [place] FDA and  has been authorized for detection and/or diagnosis of SARS-CoV-2 by  FDA under an Emergency Use Authorization (EUA). This EUA will remain  in effect (meaning this test can be used) for the duration of the  Covid-19 declaration under Section 564(b)(1) of the Act, 21  U.S.C. section 360bbb-3(b)(1), unless the authorization is  terminated  or revoked. Performed at [facility], [address] [place], [place] [address]   MRSA PCR Screening     Status: None   Collection Time: 09/15/20  6:03 PM   Specimen: Nasopharyngeal  Result Value Ref Range Status   MRSA by PCR NEGATIVE NEGATIVE Final    Comment:        The GeneXpert MRSA Assay (FDA approved for NASAL specimens only), is one component of a comprehensive MRSA colonization surveillance program. It is not intended to diagnose MRSA infection nor to guide or monitor treatment for MRSA infections. Performed at [facility], [address] [place], [place] [address]      Labs: BNP (last 3 results) No results for input(s): BNP in the last 8760 hours. Basic Metabolic Panel: Recent Labs  Lab 09/11/20 2237 09/11/20 2243 09/13/20 0629 09/14/20 0129 09/14/20 1355 09/14/20 1408 09/17/20 0330  NA 135   < > 138 138 137 137 136  K 4.0   < > 4.1 4.2 4.2 4.1 4.4  CL 105   < > 107 105 105 103 104  CO2 23  --  24 25 24   --  22  GLUCOSE 114*   < > 96 108* 89 81 123*  BUN 30*   < > 21 21 24* 25* 17  CREATININE 1.21   < > 1.05 1.02 1.12 1.10 0.99  CALCIUM 8.5*  --  8.8* 9.1 9.2  --  8.6*  MG  --   --  2.0 1.9  --   --   --    < > = values in this interval not displayed.   Liver Function Tests: Recent Labs  Lab 09/11/20 2237 09/14/20 1355  AST 24 31  ALT 23 27  ALKPHOS 86 91  BILITOT 0.9 0.9  PROT 5.7* 6.7  ALBUMIN 3.4* 3.7   No results for input(s): LIPASE, AMYLASE in the last 168 hours. No results for input(s): AMMONIA in the last 168 hours. CBC: Recent Labs  Lab 09/11/20 2237 09/11/20 2243 09/13/20 0629 09/13/20 0629 09/14/20 0129 09/14/20 1355 09/14/20 1408 09/16/20 0557 09/17/20 0330  WBC 4.1   < > 4.2  --  4.1 5.3  --  5.2 5.9  NEUTROABS 2.0  --   --   --   --  2.6  --   --   --   HGB 10.9*   < > 11.6*   < > 12.0* 13.1 12.2* 12.0* 11.2*  HCT 31.5*   < > 33.7*   < > 34.5* 38.0* 36.0* 34.6* 31.8*  MCV 98.4   < > 95.7  --  95.3  98.4  --  96.6 97.0  PLT 156   < > 148*  --  161 167  --  165 153   < > = values in this interval not displayed.   Cardiac Enzymes: No results for input(s): CKTOTAL, CKMB, CKMBINDEX, TROPONINI in the last 168 hours. BNP: Invalid input(s): POCBNP CBG: Recent Labs  Lab 09/11/20 2150 09/14/20 1354 09/15/20  2110  GLUCAP 106* 86 103*   D-Dimer No results for input(s): DDIMER in the last 72 hours. Hgb A1c No results for input(s): HGBA1C in the last 72 hours. Lipid Profile No results for input(s): CHOL, HDL, LDLCALC, TRIG, CHOLHDL, LDLDIRECT in the last 72 hours. Thyroid function studies No results for input(s): TSH, T4TOTAL, T3FREE, THYROIDAB in the last 72 hours.  Invalid input(s): FREET3 Anemia work up No results for input(s): VITAMINB12, FOLATE, FERRITIN, TIBC, IRON, RETICCTPCT in the last 72 hours. Urinalysis    Component Value Date/Time   COLORURINE STRAW (A) 09/12/2020 0052   APPEARANCEUR CLEAR 09/12/2020 0052   LABSPEC 1.017 09/12/2020 0052   PHURINE 7.0 09/12/2020 0052   GLUCOSEU NEGATIVE 09/12/2020 0052   HGBUR NEGATIVE 09/12/2020 0052   BILIRUBINUR NEGATIVE 09/12/2020 0052   KETONESUR NEGATIVE 09/12/2020 0052   PROTEINUR NEGATIVE 09/12/2020 0052   NITRITE NEGATIVE 09/12/2020 0052   LEUKOCYTESUR NEGATIVE 09/12/2020 0052   Sepsis Labs Invalid input(s): PROCALCITONIN,  WBC,  LACTICIDVEN Microbiology Recent Results (from the past 240 hour(s))  Respiratory Panel by RT PCR (Flu A&B, Covid) - Nasopharyngeal Swab     Status: None   Collection Time: 09/12/20 11:28 AM   Specimen: Nasopharyngeal Swab  Result Value Ref Range Status   SARS Coronavirus 2 by RT PCR NEGATIVE NEGATIVE Final    Comment: (NOTE) SARS-CoV-2 target nucleic acids are NOT DETECTED.  The SARS-CoV-2 RNA is generally detectable in upper respiratoy specimens during the acute phase of infection. The lowest concentration of SARS-CoV-2 viral copies this assay can detect is 131 copies/mL. A negative  result does not preclude SARS-Cov-2 infection and should not be used as the sole basis for treatment or other patient management decisions. A negative result may occur with  improper specimen collection/handling, submission of specimen other than nasopharyngeal swab, presence of viral mutation(s) within the areas targeted by this assay, and inadequate number of viral copies (<131 copies/mL). A negative result must be combined with clinical observations, patient history, and epidemiological information. The expected result is Negative.  Fact Sheet for Patients:  https://www.moore.com/  Fact Sheet for Healthcare Providers:  https://www.young.biz/  This test is no t yet approved or cleared by the [place] FDA and  has been authorized for detection and/or diagnosis of SARS-CoV-2 by FDA under an Emergency Use Authorization (EUA). This EUA will remain  in effect (meaning this test can be used) for the duration of the COVID-19 declaration under Section 564(b)(1) of the Act, 21 U.S.C. section 360bbb-3(b)(1), unless the authorization is terminated or revoked sooner.     Influenza A by PCR NEGATIVE NEGATIVE Final   Influenza B by PCR NEGATIVE NEGATIVE Final    Comment: (NOTE) The Xpert Xpress SARS-CoV-2/FLU/RSV assay is intended as an aid in  the diagnosis of influenza from Nasopharyngeal swab specimens and  should not be used as a sole basis for treatment. Nasal washings and  aspirates are unacceptable for Xpert Xpress SARS-CoV-2/FLU/RSV  testing.  Fact Sheet for Patients: https://www.moore.com/  Fact Sheet for Healthcare Providers: https://www.young.biz/  This test is not yet approved or cleared by the [place] FDA and  has been authorized for detection and/or diagnosis of SARS-CoV-2 by  FDA under an Emergency Use Authorization (EUA). This EUA will remain  in effect (meaning this test can be used)  for the duration of the  Covid-19 declaration under Section 564(b)(1) of the Act, 21  U.S.C. section 360bbb-3(b)(1), unless the authorization is  terminated or revoked. Performed at [facility],  [address], [place], [place] [address]   MRSA PCR Screening     Status: None   Collection Time: 09/15/20  6:03 PM   Specimen: Nasopharyngeal  Result Value Ref Range Status   MRSA by PCR NEGATIVE NEGATIVE Final    Comment:        The GeneXpert MRSA Assay (FDA approved for NASAL specimens only), is one component of a comprehensive MRSA colonization surveillance program. It is not intended to diagnose MRSA infection nor to guide or monitor treatment for MRSA infections. Performed at [facility], [address] [place], [place] [address]     Please note: You were cared for by a hospitalist during your hospital stay. Once you are discharged, your primary care physician will handle any further medical issues. Please note that NO REFILLS for any discharge medications will be authorized once you are discharged, as it is imperative that you return to your primary care physician (or establish a relationship with a primary care physician if you do not have one) for your post hospital discharge needs so that they can reassess your need for medications and monitor your lab values.    Time coordinating discharge: 40 minutes  SIGNED:   Burnadette Pop, MD  [facility] 09/17/2020, 11:59 AM Pager [phone]  If 7PM-7AM, please contact night-coverage [email or web address] Password [email or web address]

## 2020-09-17 NOTE — Progress Notes (Signed)
Discharge instructions (including medications) discussed with and copy provided to patient/caregiver 

## 2020-09-17 NOTE — Progress Notes (Signed)
STROKE TEAM PROGRESS NOTE   INTERVAL HISTORY Wife at bedside. Pt is dressed up for being discharged. He is doing well. He will be discharged on ASA 81 and eliquis. He will follow up with VVS and neurology.   Vitals:   09/17/20 0100 09/17/20 0400 09/17/20 0728 09/17/20 0759  BP: (!) 122/47 (!) 123/56 (!) 150/59   Pulse: 63 62 69   Resp: 12 11 18    Temp: 97.9 F (36.6 C) 97.6 F (36.4 C) 98.6 F (37 C)   TempSrc: Oral Oral Oral   SpO2:   100% 99%  Weight:      Height:       CBC:  Recent Labs  Lab 09/11/20 2237 09/11/20 2243 09/14/20 1355 09/14/20 1408 09/16/20 0557 09/17/20 0330  WBC 4.1   < > 5.3   < > 5.2 5.9  NEUTROABS 2.0  --  2.6  --   --   --   HGB 10.9*   < > 13.1   < > 12.0* 11.2*  HCT 31.5*   < > 38.0*   < > 34.6* 31.8*  MCV 98.4   < > 98.4   < > 96.6 97.0  PLT 156   < > 167   < > 165 153   < > = values in this interval not displayed.   Basic Metabolic Panel:  Recent Labs  Lab 09/13/20 0629 09/13/20 0629 09/14/20 0129 09/14/20 0129 09/14/20 1355 09/14/20 1355 09/14/20 1408 09/17/20 0330  NA 138   < > 138   < > 137   < > 137 136  K 4.1   < > 4.2   < > 4.2   < > 4.1 4.4  CL 107   < > 105   < > 105   < > 103 104  CO2 24   < > 25   < > 24  --   --  22  GLUCOSE 96   < > 108*   < > 89   < > 81 123*  BUN 21   < > 21   < > 24*   < > 25* 17  CREATININE 1.05   < > 1.02   < > 1.12   < > 1.10 0.99  CALCIUM 8.8*   < > 9.1   < > 9.2  --   --  8.6*  MG 2.0  --  1.9  --   --   --   --   --    < > = values in this interval not displayed.   Lipid Panel:  Recent Labs  Lab 09/12/20 0500  CHOL 110  TRIG 80  HDL 36*  CHOLHDL 3.1  VLDL 16  LDLCALC 58   HgbA1c:  Recent Labs  Lab 09/12/20 0500  HGBA1C 5.4   Urine Drug Screen:  Recent Labs  Lab 09/12/20 0051  LABOPIA NONE DETECTED  COCAINSCRNUR NONE DETECTED  LABBENZ NONE DETECTED  AMPHETMU NONE DETECTED  THCU NONE DETECTED  LABBARB NONE DETECTED    Alcohol Level  Recent Labs  Lab 09/12/20 0110   ETH <10    IMAGING past 24 hours No results found.  PHYSICAL EXAM General - Well nourished, well developed, in no apparent distress. Left neck wound dry clean  Ophthalmologic - fundi not visualized due to noncooperation.  Cardiovascular - Regular rhythm and rate.  Mental Status -  Level of arousal and orientation to time, place, and person were intact. Language including expression,  naming, repetition, comprehension was assessed and found intact. Fund of Knowledge was assessed and was intact.  Cranial Nerves II - XII - II - Visual field intact OU. III, IV, VI - Extraocular movements intact. V - Facial sensation intact bilaterally. VII - Facial movement intact bilaterally. VIII - Hearing & vestibular intact bilaterally. X - Palate elevates symmetrically. XI - Chin turning & shoulder shrug intact bilaterally. XII - Tongue protrusion intact.  Motor Strength - The patient's strength was normal in all extremities and pronator drift was absent.  Bulk was normal and fasciculations were absent.   Motor Tone - Muscle tone was assessed at the neck and appendages and was normal.  Reflexes - The patient's reflexes were symmetrical in all extremities and he had no pathological reflexes.  Sensory - Light touch, temperature/pinprick were assessed and were symmetrical.    Coordination - The patient had normal movements in the hands with no ataxia or dysmetria.  Tremor was absent.  Gait and Station - deferred   ASSESSMENT/PLAN [name] is a 79 y.o. male with history of HTN, GERD, bradycardia s/p pacemaker in 2016, L ICA plaque who was discharged from [facility]. [facility] earler that day after presenting with word finding difficulty. Dx w/ TIA and started on Eliquis for confirmed afib. After arrival home, he took a shower and had subsequent recurrent transient aphasia.   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 ASPECTS 10  CTA H&N repeat - stable. No LVO or significant stenosis. Irregular L ICA plaque at origin, unchanged.   LDL - 58  HgbA1c - 5.4  UDS - negative  VTE prophylaxis - Eliquis  aspirin 81 mg daily prior to admission, on ASA 81mg  post left CEA. Will be discharged with ASA 81 and eliquis 5mg  bid.  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 on board  s/p left CEA 11/4  On ASA 81 post op, continue on discharge  PAF/Aflutter Vtach SVT  A. fib listed in his medical problem list  Pt cardiologist in [place] Dr. 13/4 contacted by Dr. [place] and confirmed that pt did have short run of afib but low burden in the past. No AC started   Pacemaker interrogation +SVT, short run of aflutter per Dr. Virgina Organ    Asymptomatic Vtach 12-13 beats 10/31  On eliquis 5mg  bid  Pt moved from [place] to [place] days ago, set up with Dr. 11/31 follow up as outpt.   Hypertension  Home BP meds: metoprolol 25 daily  BP stable   BP goal normotensive  Hyperlipidemia  Home Lipid lowering medication: pravastatin 40  LDL 58, goal < 70  Now on Lipitor 40 mg daily   Continue statin at discharge  Other Stroke Risk Factors  Advanced age  ETOH use, advised to drink no more than 2 alcoholic beverage per day.  Family hx strokes  Other Active Problems  Aortic Atherosclerosis   Bradycardia s/p pacer  GERD  Hospital day # 2  Neurology will sign off. Please call with questions. Pt will follow up with stroke clinic NP at [facility] in about 4 weeks. Thanks for the consult.   [place], MD PhD  Stroke Neurology 09/17/2020 9:35 AM      To contact Stroke Continuity provider, please refer to [email or web address]. After hours,  contact General Neurology

## 2020-09-17 NOTE — Progress Notes (Addendum)
[facility]  Subjective  - Doing well over all, a little soreness at incision.   Objective (!) 150/59 69 98.6 F (37 C) (Oral) 18 100%  Intake/Output Summary (Last 24 hours) at 09/17/2020 0736 Last data filed at 09/17/2020 1062 Gross per 24 hour  Intake 1005.33 ml  Output 3950 ml  Net -2944.67 ml    Left neck incision soft without hematoma, min ecchymosis No tongue deviation and no facial droop Moving all 4 ext Lungs non labored breathing  Assessment/Planning: POD # 1 left CEA by Dr. Randie Heinz for symptomatic left ICA stenosis  Stable disposition from a vascular surgery point of view. No neurologic deficits post op. F/U with Dr. Randie Heinz in 2-3 weeks  Mosetta Pigeon 09/17/2020 7:36 AM --  Laboratory Lab Results: Recent Labs    09/16/20 0557 09/17/20 0330  WBC 5.2 5.9  HGB 12.0* 11.2*  HCT 34.6* 31.8*  PLT 165 153   BMET Recent Labs    09/14/20 1355 09/14/20 1355 09/14/20 1408 09/17/20 0330  NA 137   < > 137 136  K 4.2   < > 4.1 4.4  CL 105   < > 103 104  CO2 24  --   --  22  GLUCOSE 89   < > 81 123*  BUN 24*   < > 25* 17  CREATININE 1.12   < > 1.10 0.99  CALCIUM 9.2  --   --  8.6*   < > = values in this interval not displayed.    COAG Lab Results  Component Value Date   INR 1.2 09/14/2020   INR 1.1 09/11/2020   No results found for: PTT  I have independently interviewed and examined patient and agree with PA assessment and plan above.   Joyice Magda C. Randie Heinz, MD [facility] Office: [phone] Pager: [phone]

## 2020-09-28 ENCOUNTER — Institutional Professional Consult (permissible substitution): Payer: Medicare Other | Admitting: Cardiology

## 2020-09-28 ENCOUNTER — Encounter: Payer: Self-pay | Admitting: Cardiology

## 2020-09-28 ENCOUNTER — Ambulatory Visit (INDEPENDENT_AMBULATORY_CARE_PROVIDER_SITE_OTHER): Payer: Medicare Other | Admitting: Cardiology

## 2020-09-28 ENCOUNTER — Other Ambulatory Visit: Payer: Self-pay

## 2020-09-28 VITALS — BP 132/60 | HR 60 | Ht 70.0 in | Wt 160.0 lb

## 2020-09-28 DIAGNOSIS — Z950 Presence of cardiac pacemaker: Secondary | ICD-10-CM | POA: Diagnosis not present

## 2020-09-28 DIAGNOSIS — I4892 Unspecified atrial flutter: Secondary | ICD-10-CM

## 2020-09-28 DIAGNOSIS — G459 Transient cerebral ischemic attack, unspecified: Secondary | ICD-10-CM

## 2020-09-28 DIAGNOSIS — I10 Essential (primary) hypertension: Secondary | ICD-10-CM | POA: Diagnosis not present

## 2020-09-28 NOTE — Patient Instructions (Addendum)
Medication Instructions:  Your physician recommends that you continue on your current medications as directed. Please refer to the Current Medication list given to you today.  Labwork: None ordered.  Testing/Procedures: None ordered.  Follow-Up: Your physician wants you to follow-up in: 6 months with Dr. Lalla Brothers.   You will receive a reminder letter in the mail two months in advance. If you don't receive a letter, please call our office to schedule the follow-up appointment.  Remote monitoring is used to monitor your Pacemaker from home.  This will be set up by the device clinic.  [phone]  Any Other Special Instructions Will Be Listed Below (If Applicable).  If you need a refill on your cardiac medications before your next appointment, please call your pharmacy.

## 2020-09-28 NOTE — Progress Notes (Signed)
Electrophysiology Office Note:    Date:  09/28/2020   ID:  [name], DOB Apr 05, 1941, MRN [id number]  PCP:  Patient, No Pcp Per  [facility] Cardiologist:  No primary care provider on file.  [facility] Electrophysiologist:  None   Referring MD: Marvel Plan, MD   Chief Complaint: Atrial flutter and pacemaker  History of Present Illness:    [name] is a 79 y.o. male who presents for an evaluation of atrial flutter and pacemaker at the request of DrXu. Their medical history includes hypertension, GERD.  He was recently hospitalized from November 2 through November 5 for slurred speech/TIA.  During the hospitalization he underwent a left carotid endarterectomy.  He is doing well after hospitalization.  He says that he is slowly recovering and still has a little residual fogginess from the anesthesia.  Past Medical History:  Diagnosis Date   GERD (gastroesophageal reflux disease)    Hypertension     Past Surgical History:  Procedure Laterality Date   ENDARTERECTOMY Left 09/16/2020   Procedure: LEFT ENDARTERECTOMY CAROTID;  Surgeon: Maeola Harman, MD;  Location: [facility] OR;  Service: Vascular;  Laterality: Left;   HERNIA REPAIR     PACEMAKER IMPLANT     TONSILLECTOMY      Current Medications: Current Meds  Medication Sig   albuterol (VENTOLIN HFA) 108 (90 Base) MCG/ACT inhaler Inhale 2 puffs into the lungs 4 (four) times daily as needed for wheezing or shortness of breath.   apixaban (ELIQUIS) 5 MG TABS tablet Take 1 tablet (5 mg total) by mouth 2 (two) times daily.   aspirin EC 81 MG tablet Take 1 tablet (81 mg total) by mouth daily. Swallow whole.   atorvastatin (LIPITOR) 40 MG tablet Take 1 tablet (40 mg total) by mouth daily.   cholecalciferol (VITAMIN D) 25 MCG (1000 UNIT) tablet Take 1,000 Units by mouth at bedtime.   Fluticasone Furoate (ARNUITY ELLIPTA) 200 MCG/ACT AEPB Inhale 1 puff into the lungs every morning.   Magnesium 250 MG TABS  Take 250 mg by mouth every morning.   metoprolol tartrate (LOPRESSOR) 50 MG tablet Take 25 mg by mouth daily.   Multiple Vitamin (MULTIVITAMIN WITH MINERALS) TABS tablet Take 1 tablet by mouth every morning.   Omega-3 Fatty Acids (FISH OIL PO) Take 1 capsule by mouth every morning.   omeprazole (PRILOSEC) 40 MG capsule Take 40 mg by mouth daily before supper.     Allergies:   Patient has no known allergies.   Social History   Socioeconomic History   Marital status: Married    Spouse name: Not on file   Number of children: Not on file   Years of education: Not on file   Highest education level: Not on file  Occupational History   Not on file  Tobacco Use   Smoking status: Former Smoker   Smokeless tobacco: Never Used  Substance and Sexual Activity   Alcohol use: Yes    Comment: occassional   Drug use: Not on file   Sexual activity: Not Currently  Other Topics Concern   Not on file  Social History Narrative   Not on file   Social Determinants of Health   Financial Resource Strain:    Difficulty of Paying Living Expenses: Not on file  Food Insecurity:    Worried About Running Out of Food in the Last Year: Not on file   Ran Out of Food in the Last Year: Not on file  Transportation Needs:  Lack of Transportation (Medical): Not on file   Lack of Transportation (Non-Medical): Not on file  Physical Activity:    Days of Exercise per Week: Not on file   Minutes of Exercise per Session: Not on file  Stress:    Feeling of Stress : Not on file  Social Connections:    Frequency of Communication with Friends and Family: Not on file   Frequency of Social Gatherings with Friends and Family: Not on file   Attends Religious Services: Not on file   Active Member of Clubs or Organizations: Not on file   Attends Banker Meetings: Not on file   Marital Status: Not on file     Family History: The patient's family history is not on  file.  ROS:   Please see the history of present illness.    All other systems reviewed and are negative.  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  In person device interrogation personally reviewed Longevity 10.5 years Presenting rhythm atrially paced, ventricular sensed Underlying rhythm sinus bradycardia 35 bpm 3 mode switch episodes, longest 12 seconds Atrial lead impedance 400 ohms, capture threshold 0.750.4, sensing 1.8 mV Ventricular lead impedance 540 ohms, capture threshold 0.875 0.4, sensing 2 mV DDDR 60-1 20   EKG:  The ekg ordered today demonstrates atrial paced, ventricular sensed rhythm at 60 bpm  Recent Labs: 09/13/2020: TSH 1.066 09/14/2020: ALT 27; Magnesium 1.9 09/17/2020: BUN 17; Creatinine, Ser 0.99; Hemoglobin 11.2; Platelets 153; Potassium 4.4; Sodium 136  Recent Lipid Panel    Component Value Date/Time   CHOL 110 09/12/2020 0500   TRIG 80 09/12/2020 0500   HDL 36 (L) 09/12/2020 0500   CHOLHDL 3.1 09/12/2020 0500   VLDL 16 09/12/2020 0500   LDLCALC 58 09/12/2020 0500    Physical Exam:    VS:  BP 132/60    Pulse 60    Ht 5\' 10"  (1.778 m)    Wt 160 lb (72.6 kg)    SpO2 98%    BMI 22.96 kg/m     Wt Readings from Last 3 Encounters:  09/28/20 160 lb (72.6 kg)  09/14/20 160 lb (72.6 kg)  09/11/20 160 lb (72.6 kg)     GEN:  Well nourished, well developed in no acute distress HEENT: Normal NECK: No JVD; No carotid bruits.  Left carotid endarterectomy scar well-healing LYMPHATICS: No lymphadenopathy CARDIAC: RRR, no murmurs, rubs, gallops.  Pacemaker pocket without pain, incision well-healed RESPIRATORY:  Clear to auscultation without rales, wheezing or rhonchi  ABDOMEN: Soft,  non-tender, non-distended MUSCULOSKELETAL:  No edema; No deformity  SKIN: Warm and dry NEUROLOGIC:  Alert and oriented x 3 PSYCHIATRIC:  Normal affect   ASSESSMENT:    1. Atrial flutter, unspecified type (HCC)   2. TIA (transient ischemic attack)   3. Essential hypertension   4. Presence of permanent cardiac pacemaker    PLAN:    In order of problems listed above:  1. Atrial flutter Patient with a history of device detected atrial flutter.  Previously was not anticoagulated and after his recent TIA, has been started on Eliquis.  No bleeding issues while on Eliquis.  2.  TIA Now post carotid endarterectomy.  Successfully started on Eliquis 5 mg.  He is tolerating the blood thinner without any bleeding  issues.  3.  Hypertension Controlled during today's visit.  Continue metoprolol.  4.  Pacemaker in situ Lead parameters stable, longevity 10.5 years  Follow-up 6 months  Medication Adjustments/Labs and Tests Ordered: Current medicines are reviewed at length with the patient today.  Concerns regarding medicines are outlined above.  Orders Placed This Encounter  Procedures   EKG 12-Lead   No orders of the defined types were placed in this encounter.    Signed, Steffanie Dunn, MD, [facility]  09/28/2020 1:59 PM    Electrophysiology  [facility]

## 2020-10-15 ENCOUNTER — Other Ambulatory Visit: Payer: Self-pay

## 2020-10-15 ENCOUNTER — Encounter: Payer: Self-pay | Admitting: Vascular Surgery

## 2020-10-15 ENCOUNTER — Ambulatory Visit (INDEPENDENT_AMBULATORY_CARE_PROVIDER_SITE_OTHER): Payer: Self-pay | Admitting: Vascular Surgery

## 2020-10-15 VITALS — BP 117/73 | HR 70 | Temp 98.0°F | Resp 20 | Ht 70.0 in | Wt 160.0 lb

## 2020-10-15 DIAGNOSIS — I6523 Occlusion and stenosis of bilateral carotid arteries: Secondary | ICD-10-CM

## 2020-10-15 NOTE — Progress Notes (Signed)
    Subjective:     Patient ID: [name], male   DOB: 26-Nov-1940, 79 y.o.   MRN: [id number]  HPI 79 year old male follows up after left carotid endarterectomy.  This was done for symptomatic disease with TIAs.  Since the procedure he has no further TIAs.  Wound is healing well.  Initially had some voice changes these have resolved.  He is swallowing without issue.  He does feel a little rundown but that his energy is returning.   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  Contralateral side less than 50% stenosis by CT angio.    Plan:     Follow-up 9 months with carotid duplex     Timmie Dugue C. Randie Heinz, MD [facility] Office: [phone] Pager: [phone]

## 2020-10-19 ENCOUNTER — Other Ambulatory Visit: Payer: Self-pay

## 2020-10-19 DIAGNOSIS — I6523 Occlusion and stenosis of bilateral carotid arteries: Secondary | ICD-10-CM

## 2020-10-21 ENCOUNTER — Encounter: Payer: Self-pay | Admitting: Adult Health

## 2020-10-21 ENCOUNTER — Other Ambulatory Visit: Payer: Self-pay

## 2020-10-21 ENCOUNTER — Ambulatory Visit (INDEPENDENT_AMBULATORY_CARE_PROVIDER_SITE_OTHER): Payer: Medicare Other | Admitting: Adult Health

## 2020-10-21 VITALS — BP 136/61 | HR 59 | Ht 70.0 in | Wt 160.0 lb

## 2020-10-21 DIAGNOSIS — G459 Transient cerebral ischemic attack, unspecified: Secondary | ICD-10-CM | POA: Diagnosis not present

## 2020-10-21 DIAGNOSIS — I10 Essential (primary) hypertension: Secondary | ICD-10-CM | POA: Diagnosis not present

## 2020-10-21 DIAGNOSIS — Z9889 Other specified postprocedural states: Secondary | ICD-10-CM

## 2020-10-21 DIAGNOSIS — I480 Paroxysmal atrial fibrillation: Secondary | ICD-10-CM

## 2020-10-21 DIAGNOSIS — Z9189 Other specified personal risk factors, not elsewhere classified: Secondary | ICD-10-CM | POA: Diagnosis not present

## 2020-10-21 DIAGNOSIS — E785 Hyperlipidemia, unspecified: Secondary | ICD-10-CM | POA: Diagnosis not present

## 2020-10-21 NOTE — Patient Instructions (Addendum)
Continue aspirin 81 mg daily and Eliquis (apixaban) daily  and atorvastatin 40 mg daily for secondary stroke prevention  F/u with vascular surgery Dr. Randie Heinz in 9 months for carotid ultrasound  F/u with cardiology Dr. Lalla Brothers routine monitoring and management of atrial fibrillation and prescribing of Eliquis  Referral to sleep clinic for underlying sleep apnea - untreated sleep apnea can greatly increase your risk of additional stroke and heart disease   Continue to follow up with PCP regarding cholesterol and blood pressure management  Maintain strict control of hypertension with blood pressure goal below 130/90 and cholesterol with LDL cholesterol (bad cholesterol) goal below 70 mg/dL.      Followup in the future with me in 4 months or call earlier if needed      Thank you for coming to see [name] at [facility]. I hope we have been able to provide you high quality care today.  You may receive a patient satisfaction survey over the next few weeks. We would appreciate your feedback and comments so that we may continue to improve ourselves and the health of our patients.   Sleep Apnea Sleep apnea is a condition in which breathing pauses or becomes shallow during sleep. Episodes of sleep apnea usually last 10 seconds or longer, and they may occur as many as 20 times an hour. Sleep apnea disrupts your sleep and keeps your body from getting the rest that it needs. This condition can increase your risk of certain health problems, including:  Heart attack.  Stroke.  Obesity.  Diabetes.  Heart failure.  Irregular heartbeat. What are the causes? There are three kinds of sleep apnea:  Obstructive sleep apnea. This kind is caused by a blocked or collapsed airway.  Central sleep apnea. This kind happens when the part of the brain that controls breathing does not send the correct signals to the muscles that control breathing.  Mixed sleep apnea. This is a combination  of obstructive and central sleep apnea. The most common cause of this condition is a collapsed or blocked airway. An airway can collapse or become blocked if:  Your throat muscles are abnormally relaxed.  Your tongue and tonsils are larger than normal.  You are overweight.  Your airway is smaller than normal. What increases the risk? You are more likely to develop this condition if you:  Are overweight.  Smoke.  Have a smaller than normal airway.  Are elderly.  Are male.  Drink alcohol.  Take sedatives or tranquilizers.  Have a family history of sleep apnea. What are the signs or symptoms? Symptoms of this condition include:  Trouble staying asleep.  Daytime sleepiness and tiredness.  Irritability.  Loud snoring.  Morning headaches.  Trouble concentrating.  Forgetfulness.  Decreased interest in sex.  Unexplained sleepiness.  Mood swings.  Personality changes.  Feelings of depression.  Waking up often during the night to urinate.  Dry mouth.  Sore throat. How is this diagnosed? This condition may be diagnosed with:  A medical history.  A physical exam.  A series of tests that are done while you are sleeping (sleep study). These tests are usually done in a sleep lab, but they may also be done at home. How is this treated? Treatment for this condition aims to restore normal breathing and to ease symptoms during sleep. It may involve managing health issues that can affect breathing, such as high blood pressure or obesity. Treatment may include:  Sleeping on your side.  Using a decongestant  if you have nasal congestion.  Avoiding the use of depressants, including alcohol, sedatives, and narcotics.  Losing weight if you are overweight.  Making changes to your diet.  Quitting smoking.  Using a device to open your airway while you sleep, such as: ? An oral appliance. This is a custom-made mouthpiece that shifts your lower jaw forward. ? A  continuous positive airway pressure (CPAP) device. This device blows air through a mask when you breathe out (exhale). ? A nasal expiratory positive airway pressure (EPAP) device. This device has valves that you put into each nostril. ? A bi-level positive airway pressure (BPAP) device. This device blows air through a mask when you breathe in (inhale) and breathe out (exhale).  Having surgery if other treatments do not work. During surgery, excess tissue is removed to create a wider airway. It is important to get treatment for sleep apnea. Without treatment, this condition can lead to:  High blood pressure.  Coronary artery disease.  In men, an inability to achieve or maintain an erection (impotence).  Reduced thinking abilities. Follow these instructions at home: Lifestyle  Make any lifestyle changes that your health care provider recommends.  Eat a healthy, well-balanced diet.  Take steps to lose weight if you are overweight.  Avoid using depressants, including alcohol, sedatives, and narcotics.  Do not use any products that contain nicotine or tobacco, such as cigarettes, e-cigarettes, and chewing tobacco. If you need help quitting, ask your health care provider. General instructions  Take over-the-counter and prescription medicines only as told by your health care provider.  If you were given a device to open your airway while you sleep, use it only as told by your health care provider.  If you are having surgery, make sure to tell your health care provider you have sleep apnea. You may need to bring your device with you.  Keep all follow-up visits as told by your health care provider. This is important. Contact a health care provider if:  The device that you received to open your airway during sleep is uncomfortable or does not seem to be working.  Your symptoms do not improve.  Your symptoms get worse. Get help right away if:  You develop: ? Chest pain. ? Shortness  of breath. ? Discomfort in your back, arms, or stomach.  You have: ? Trouble speaking. ? Weakness on one side of your body. ? Drooping in your face. These symptoms may represent a serious problem that is an emergency. Do not wait to see if the symptoms will go away. Get medical help right away. Call your local emergency services (911 in the [place] Do not drive yourself to the hospital. Summary  Sleep apnea is a condition in which breathing pauses or becomes shallow during sleep.  The most common cause is a collapsed or blocked airway.  The goal of treatment is to restore normal breathing and to ease symptoms during sleep. This information is not intended to replace advice given to you by your health care provider. Make sure you discuss any questions you have with your health care provider. Document Revised: 04/16/2019 Document Reviewed: 06/25/2018 Elsevier Patient Education  2020 [employer].

## 2020-10-21 NOTE — Progress Notes (Signed)
[facility] [address] [place]. [place] [address] [phone]       HOSPITAL FOLLOW UP NOTE  [name] Date of Birth:  [birth year] Medical Record Number:  [id number]   Reason for Referral:  hospital stroke follow up    SUBJECTIVE:   CHIEF COMPLAINT:  Chief Complaint  Patient presents with  . Hospitalization Follow-up    Rm 9,with wife, pt states he is doing well, no PCP at this time, just moved from [place]    HPI:   [name] is a 79 y.o. male who recently moved to [facility] from [place] with a PMHx of Htn, GERD, ? atrial fibrillation, bradycardia s/p pacemaker in 2016.  Presented on 09/11/2020 with word finding difficulty and headache.  Personally reviewed pertinent hospitalization progress notes, lab work and imaging with summary provided.  Evaluated by Dr. Roda Shutters with likely TIA secondary to AF not on AC vs soft plaque at left ICA bulb.  MRI unable to be obtained d/t noncompatible pacer.  Pacer interrogation +SVT and short run of a flutter.  Questionable history of A. fib which was confirmed by prior cardiologist but low burden therefore AC not started.  In setting of recent TIA, initiated Eliquis for secondary stroke prevention. Hx of HTN stable and resumed home dose metoprolol. Hx of HLD on pravastatin with LDL 58.  Other stroke risk factors include advanced age, EtOH use and family history of strokes but no personal prior history of strokes.  Other active problems include anemia, aortic arthrosclerosis, bradycardia s/p pacer and GERD.  Evaluated by therapies without additional therapy needs and discharged home in stable condition on 09/14/2020.  He returned shortly after discharge on 09/14/2020 with recurrence of speech and language difficulty.  CTA head/neck repeated negative LVO or worsening stenosis with stable fibrofatty plaque of left ICA origin.  Vascular surgery consulted and underwent left CEA without complication.  Initiated aspirin in addition  to Eliquis at discharge.  Symptoms resolved and discharged home without therapy needs on 09/17/2020.   09/11/2020 TIA secondary to AF not on AC vs. Soft plaque at left ICA bulb  CT head - no acute abnormality  MRI head - not able to do due to pacer not compatible with MRI  CTA H&N - No emergent large vessel occlusion or high-grade stenosis of the intracranial arteries. Eccentric plaque within the proximal left internal carotid artery may be at increased risk of embolus formation. Bilateral carotid bifurcation atherosclerosis without hemodynamically significant stenosis by NASCET criteria.   CT repeat Unremarkable   2D Echo - EF 55-60%. No source of embolus   Pacemaker interrogation +SVT, short run of Aflutter per Dr. 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 ASPECTS 10  CTA H&N repeat - stable. No LVO or significant stenosis. Irregular L ICA plaque at origin, unchanged.   S/p L CEA for high risk soft plaque left ICA  LDL - 58 -initiated atorvastatin 40 mg daily  HgbA1c - 5.4  UDS - negative  VTE prophylaxis - Eliquis  aspirin 81 mg dailyprior to admission,  on ASA 81mg  post left CEA. Will be discharged with ASA 81 and eliquis 5mg  bid.  Today, 10/21/2020, [name] is being seen for hospital follow-up accompanied by his wife.  He has been doing well since discharge without new or reoccurring stroke/TIA symptoms.  Remains on aspirin and Eliquis for secondary stroke prevention with known AF and s/p L CEA without bleeding or bruising.  Remains on atorvastatin 40 mg daily without myalgias.  Blood pressure today satisfactory  136/61.  Wife concerned regarding possible apnea with sleep study 3 [place] with mild sleep apnea per patient (unable to view via epic) and no treatment indicated.  Since that time, increased nocturia q2-3hrs, witnessed apnea and snoring.  Use of some type of mouthguard and head elevation while sleeping which has decreased snoring and apnea. No further concerns.     ROS:   14 system review of systems performed and negative with exception of those listed in HPI  PMH:  Past Medical History:  Diagnosis Date  . Carotid artery occlusion   . GERD (gastroesophageal reflux disease)   . Hypertension     PSH:  Past Surgical History:  Procedure Laterality Date  . ENDARTERECTOMY Left 09/16/2020   Procedure: LEFT ENDARTERECTOMY CAROTID;  Surgeon: Maeola Harmanain, Brandon Christopher, MD;  [facility];  Service: Vascular;  Laterality: Left;  . HERNIA REPAIR    . PACEMAKER IMPLANT    . TONSILLECTOMY      Social History:  Social History   Socioeconomic History  . Marital status: Married    Spouse name: Not on file  . Number of children: Not on file  . Years of education: Not on file  . Highest education level: Not on file  Occupational History  . Not on file  Tobacco Use  . Smoking status: Former Games developermoker  . Smokeless tobacco: Never Used  Vaping Use  . Vaping Use: Never used  Substance and Sexual Activity  . Alcohol use: Yes    Comment: occassional  . Drug use: Not on file  . Sexual activity: Not Currently  Other Topics Concern  . Not on file  Social History Narrative  . Not on file   Social Determinants of Health   Financial Resource Strain: Not on file  Food Insecurity: Not on file  Transportation Needs: Not on file  Physical Activity: Not on file  Stress: Not on file  Social Connections: Not on file  Intimate Partner Violence: Not on file    Family History: No family history on file.  Medications:   Current Outpatient Medications on File Prior to  Visit  Medication Sig Dispense Refill  . albuterol (VENTOLIN HFA) 108 (90 Base) MCG/ACT inhaler Inhale 2 puffs into the lungs 4 (four) times daily as needed for wheezing or shortness of breath.    Marland Kitchen. apixaban (ELIQUIS) 5 MG TABS tablet Take 1 tablet (5 mg total) by mouth 2 (two) times daily. 60 tablet 1  . aspirin EC 81 MG tablet Take 1 tablet (81 mg total) by mouth daily. Swallow whole. 30 tablet 1  . atorvastatin (LIPITOR) 40 MG tablet Take 1 tablet (40 mg total) by mouth daily. 30 tablet 1  . cholecalciferol (VITAMIN D) 25 MCG (1000 UNIT) tablet Take 1,000 Units by mouth at bedtime.    . Fluticasone Furoate (ARNUITY ELLIPTA) 200 MCG/ACT AEPB Inhale 1 puff into the lungs every morning.    . Magnesium 250 MG TABS Take 250 mg by mouth every morning.    .Marland Kitchen  metoprolol tartrate (LOPRESSOR) 50 MG tablet Take 25 mg by mouth daily.    . Multiple Vitamin (MULTIVITAMIN WITH MINERALS) TABS tablet Take 1 tablet by mouth every morning.    . Omega-3 Fatty Acids (FISH OIL PO) Take 1 capsule by mouth every morning.    Marland Kitchen omeprazole (PRILOSEC) 40 MG capsule Take 40 mg by mouth daily before supper.     No current facility-administered medications on file prior to visit.    Allergies:  No Known Allergies    OBJECTIVE:  Physical Exam  Vitals:   10/21/20 1026  BP: 136/61  Pulse: (!) 59  Weight: 160 lb (72.6 kg)  Height: 5\' 10"  (1.778 m)   Body mass index is 22.96 kg/m. No exam data present  General: well developed, well nourished,  very pleasant elderly Caucasian male, seated, in no evident distress Head: head normocephalic and atraumatic.   Neck: supple with no carotid or supraclavicular bruits Cardiovascular: regular rate and rhythm, no murmurs Musculoskeletal: no deformity Skin:  no rash/petichiae Vascular:  Normal pulses all extremities   Neurologic Exam Mental Status: Awake and fully alert.   Fluent speech and language.  Oriented to place and time. Recent and remote memory intact.  Attention span, concentration and fund of knowledge appropriate. Mood and affect appropriate.  Cranial Nerves: Fundoscopic exam reveals sharp disc margins. Pupils equal, briskly reactive to light. Extraocular movements full without nystagmus. Visual fields full to confrontation.  HOH bilaterally. Facial sensation intact. Face, tongue, palate moves normally and symmetrically.  Motor: Normal bulk and tone. Normal strength in all tested extremity muscles. Sensory.: intact to touch , pinprick , position and vibratory sensation.  Coordination: Rapid alternating movements normal in all extremities. Finger-to-nose and heel-to-shin performed accurately bilaterally. Gait and Station: Arises from chair without difficulty. Stance is normal. Gait demonstrates normal stride length and balance without use of assistive device. Reflexes: 1+ and symmetric. Toes downgoing.     NIHSS  0 Modified Rankin  0     ASSESSMENT: [name] is a 79 y.o. year old male presented with aphasia on 09/11/2020 likely TIA secondary to AF not on AC vs L ICA bulb soft plaque.  Pacer interrogated which showed short run of a flutter w/ confirmed history of A. Fib (initially questioned but confirmed with cardiologist in [place]) not previously on [facility] due to low burden therefore Eliquis initiated.  Returned on 09/14/2020 with recurrence of aphasia likely recurrent TIA in setting of high risk soft plaque of left ICA bulb s/p L CEA placed on aspirin in addition to Eliquis.  Vascular risk factors include A. fib/flutter, HTN, HLD, bradycardia s/p pacer, advanced age and EtOH use.      PLAN:  1. TIA, recurrent TIA:  a. No reoccurring or new stroke/TIA symptoms.   b. Continue aspirin 81 mg daily and Eliquis (apixaban) daily  and atorvastatin 40 mg daily for secondary stroke prevention.  c. Discussed secondary stroke prevention measures and importance of close PCP follow up for aggressive stroke risk factor management  2. L ICA soft  plaque: s/p CEA placed on aspirin.  Followed by [facility] with plans on carotid duplex in 9 months 3. A fib/flutter: CHA2DS2-VASc score 6 indicating AC.  On Eliquis for secondary stroke prevention.  He has established care with cardiologist Dr. 13/12/2019  4. HTN: BP goal <130/90. Stable on metoprolol per cardiology 5. HLD: LDL goal <70. Recent LDL 58. On pravastatin PTA and switched to atorvastatin 40 mg daily.  Continuation of statin.  Recently moved  from [place], [place] to [place] area and has not yet established care with PCP.  We will plan on repeating lipid panel at follow-up visit if he has not established care with PCP at that time 6. At risk for sleep apnea: Referral placed to [facility] sleep clinic to further evaluate for sleep apnea in setting of recent TIAx2 and known A. fib/flutter    Follow up in 4 months or call earlier if needed   CC:  [facility] provider: Dr. Pearlean Brownie Patient, No Pcp Per    I spent 60 minutes of face-to-face and non-face-to-face time with patient and wife.  This included previsit chart review including hospitalization pertinent progress notes, lab work and imaging, lab review, study review, order entry, electronic health record documentation, patient education regarding recent stroke, A. fib and indication for [facility], s/p CEA and indication for surveillance monitoring with [facility], at risk for sleep apnea and prolonged discussion, importance of managing stroke risk factors and answered all questions to patient and wife's satisfaction   Ihor Austin, AGNP-BC  [facility] [address] [place], [place] [address]  Phone [phone] Fax [phone] Note: This document was prepared with digital dictation and possible smart phrase technology. Any transcriptional errors that result from this process are unintentional.

## 2020-10-21 NOTE — Progress Notes (Signed)
I agree with the above plan 

## 2020-11-02 ENCOUNTER — Telehealth: Payer: Self-pay

## 2020-11-02 NOTE — Telephone Encounter (Signed)
Gave verbal orders to [place] from PT. May extend 1 time per week for 2 weeks to work on higher level balance issues.

## 2020-11-30 ENCOUNTER — Institutional Professional Consult (permissible substitution): Payer: Medicare Other | Admitting: Neurology

## 2020-12-24 ENCOUNTER — Emergency Department (HOSPITAL_COMMUNITY): Payer: Medicare Other

## 2020-12-24 ENCOUNTER — Telehealth: Payer: Self-pay

## 2020-12-24 ENCOUNTER — Inpatient Hospital Stay (HOSPITAL_COMMUNITY)
Admission: EM | Admit: 2020-12-24 | Discharge: 2020-12-27 | DRG: 069 | Disposition: A | Discharge status: Home Health | Payer: Medicare Other | Attending: Internal Medicine | Admitting: Internal Medicine

## 2020-12-24 ENCOUNTER — Other Ambulatory Visit: Payer: Self-pay

## 2020-12-24 ENCOUNTER — Encounter (HOSPITAL_COMMUNITY): Payer: Self-pay

## 2020-12-24 DIAGNOSIS — W19XXXA Unspecified fall, initial encounter: Secondary | ICD-10-CM

## 2020-12-24 DIAGNOSIS — Z7982 Long term (current) use of aspirin: Secondary | ICD-10-CM

## 2020-12-24 DIAGNOSIS — G459 Transient cerebral ischemic attack, unspecified: Secondary | ICD-10-CM | POA: Diagnosis not present

## 2020-12-24 DIAGNOSIS — E785 Hyperlipidemia, unspecified: Secondary | ICD-10-CM | POA: Diagnosis present

## 2020-12-24 DIAGNOSIS — D631 Anemia in chronic kidney disease: Secondary | ICD-10-CM | POA: Diagnosis present

## 2020-12-24 DIAGNOSIS — Z79899 Other long term (current) drug therapy: Secondary | ICD-10-CM

## 2020-12-24 DIAGNOSIS — K219 Gastro-esophageal reflux disease without esophagitis: Secondary | ICD-10-CM | POA: Diagnosis present

## 2020-12-24 DIAGNOSIS — Z20822 Contact with and (suspected) exposure to covid-19: Secondary | ICD-10-CM | POA: Diagnosis present

## 2020-12-24 DIAGNOSIS — R7303 Prediabetes: Secondary | ICD-10-CM | POA: Diagnosis present

## 2020-12-24 DIAGNOSIS — Z7951 Long term (current) use of inhaled steroids: Secondary | ICD-10-CM

## 2020-12-24 DIAGNOSIS — N182 Chronic kidney disease, stage 2 (mild): Secondary | ICD-10-CM | POA: Diagnosis present

## 2020-12-24 DIAGNOSIS — I480 Paroxysmal atrial fibrillation: Secondary | ICD-10-CM | POA: Diagnosis not present

## 2020-12-24 DIAGNOSIS — Z950 Presence of cardiac pacemaker: Secondary | ICD-10-CM

## 2020-12-24 DIAGNOSIS — M47812 Spondylosis without myelopathy or radiculopathy, cervical region: Secondary | ICD-10-CM | POA: Diagnosis present

## 2020-12-24 DIAGNOSIS — Z87891 Personal history of nicotine dependence: Secondary | ICD-10-CM

## 2020-12-24 DIAGNOSIS — S40212A Abrasion of left shoulder, initial encounter: Secondary | ICD-10-CM | POA: Diagnosis present

## 2020-12-24 DIAGNOSIS — I4892 Unspecified atrial flutter: Secondary | ICD-10-CM | POA: Diagnosis present

## 2020-12-24 DIAGNOSIS — Z7901 Long term (current) use of anticoagulants: Secondary | ICD-10-CM

## 2020-12-24 DIAGNOSIS — I129 Hypertensive chronic kidney disease with stage 1 through stage 4 chronic kidney disease, or unspecified chronic kidney disease: Secondary | ICD-10-CM | POA: Diagnosis present

## 2020-12-24 DIAGNOSIS — I10 Essential (primary) hypertension: Secondary | ICD-10-CM | POA: Diagnosis not present

## 2020-12-24 DIAGNOSIS — S51012A Laceration without foreign body of left elbow, initial encounter: Secondary | ICD-10-CM | POA: Diagnosis present

## 2020-12-24 DIAGNOSIS — W010XXA Fall on same level from slipping, tripping and stumbling without subsequent striking against object, initial encounter: Secondary | ICD-10-CM | POA: Diagnosis present

## 2020-12-24 DIAGNOSIS — R55 Syncope and collapse: Secondary | ICD-10-CM | POA: Diagnosis not present

## 2020-12-24 DIAGNOSIS — I639 Cerebral infarction, unspecified: Secondary | ICD-10-CM | POA: Diagnosis not present

## 2020-12-24 DIAGNOSIS — Z8673 Personal history of transient ischemic attack (TIA), and cerebral infarction without residual deficits: Secondary | ICD-10-CM

## 2020-12-24 DIAGNOSIS — R4701 Aphasia: Secondary | ICD-10-CM | POA: Diagnosis present

## 2020-12-24 HISTORY — DX: Cerebral infarction, unspecified: I63.9

## 2020-12-24 LAB — I-STAT CHEM 8, ED
BUN: 27 mg/dL — ABNORMAL HIGH (ref 8–23)
Calcium, Ion: 1.11 mmol/L — ABNORMAL LOW (ref 1.15–1.40)
Chloride: 104 mmol/L (ref 98–111)
Creatinine, Ser: 1.3 mg/dL — ABNORMAL HIGH (ref 0.61–1.24)
Glucose, Bld: 104 mg/dL — ABNORMAL HIGH (ref 70–99)
HCT: 37 % — ABNORMAL LOW (ref 39.0–52.0)
Hemoglobin: 12.6 g/dL — ABNORMAL LOW (ref 13.0–17.0)
Potassium: 4.7 mmol/L (ref 3.5–5.1)
Sodium: 138 mmol/L (ref 135–145)
TCO2: 23 mmol/L (ref 22–32)

## 2020-12-24 LAB — DIFFERENTIAL
Abs Immature Granulocytes: 0.01 10*3/uL (ref 0.00–0.07)
Basophils Absolute: 0.1 10*3/uL (ref 0.0–0.1)
Basophils Relative: 1 %
Eosinophils Absolute: 0 10*3/uL (ref 0.0–0.5)
Eosinophils Relative: 0 %
Immature Granulocytes: 0 %
Lymphocytes Relative: 40 %
Lymphs Abs: 1.9 10*3/uL (ref 0.7–4.0)
Monocytes Absolute: 0.5 10*3/uL (ref 0.1–1.0)
Monocytes Relative: 11 %
Neutro Abs: 2.3 10*3/uL (ref 1.7–7.7)
Neutrophils Relative %: 48 %

## 2020-12-24 LAB — COMPREHENSIVE METABOLIC PANEL
ALT: 34 U/L (ref 0–44)
AST: 29 U/L (ref 15–41)
Albumin: 3.8 g/dL (ref 3.5–5.0)
Alkaline Phosphatase: 103 U/L (ref 38–126)
Anion gap: 12 (ref 5–15)
BUN: 25 mg/dL — ABNORMAL HIGH (ref 8–23)
CO2: 21 mmol/L — ABNORMAL LOW (ref 22–32)
Calcium: 9.2 mg/dL (ref 8.9–10.3)
Chloride: 105 mmol/L (ref 98–111)
Creatinine, Ser: 1.28 mg/dL — ABNORMAL HIGH (ref 0.61–1.24)
GFR, Estimated: 57 mL/min — ABNORMAL LOW (ref >60)
Glucose, Bld: 103 mg/dL — ABNORMAL HIGH (ref 70–99)
Potassium: 4.8 mmol/L (ref 3.5–5.1)
Sodium: 138 mmol/L (ref 135–145)
Total Bilirubin: 1 mg/dL (ref 0.3–1.2)
Total Protein: 6.4 g/dL — ABNORMAL LOW (ref 6.5–8.1)

## 2020-12-24 LAB — CBC
HCT: 38.2 % — ABNORMAL LOW (ref 39.0–52.0)
Hemoglobin: 12.7 g/dL — ABNORMAL LOW (ref 13.0–17.0)
MCH: 32.1 pg (ref 26.0–34.0)
MCHC: 33.2 g/dL (ref 30.0–36.0)
MCV: 96.5 fL (ref 80.0–100.0)
Platelets: 160 10*3/uL (ref 150–400)
RBC: 3.96 MIL/uL — ABNORMAL LOW (ref 4.22–5.81)
RDW: 13.1 % (ref 11.5–15.5)
WBC: 4.9 10*3/uL (ref 4.0–10.5)
nRBC: 0 % (ref 0.0–0.2)

## 2020-12-24 LAB — PROTIME-INR
INR: 1.2 (ref 0.8–1.2)
Prothrombin Time: 14.6 seconds (ref 11.4–15.2)

## 2020-12-24 LAB — CBG MONITORING, ED: Glucose-Capillary: 98 mg/dL (ref 70–99)

## 2020-12-24 LAB — APTT: aPTT: 37 seconds — ABNORMAL HIGH (ref 24–36)

## 2020-12-24 MED ORDER — BUDESONIDE 0.25 MG/2ML IN SUSP
0.2500 mg | Freq: Two times a day (BID) | RESPIRATORY_TRACT | Status: DC
  Administered 2020-12-25 – 2020-12-27 (×4): 0.25 mg via RESPIRATORY_TRACT
  Filled 2020-12-24 (×5): qty 2

## 2020-12-24 MED ORDER — METOPROLOL TARTRATE 25 MG PO TABS: 25.0000 mg | ORAL_TABLET | Freq: Every day | ORAL | Status: DC

## 2020-12-24 MED ORDER — ATORVASTATIN CALCIUM 40 MG PO TABS
40.0000 mg | ORAL_TABLET | Freq: Every day | ORAL | Status: DC
  Administered 2020-12-25 – 2020-12-27 (×3): 40 mg via ORAL
  Filled 2020-12-24 (×4): qty 1

## 2020-12-24 MED ORDER — PANTOPRAZOLE SODIUM 40 MG PO TBEC
40.0000 mg | DELAYED_RELEASE_TABLET | Freq: Every day | ORAL | Status: DC
  Administered 2020-12-25 – 2020-12-27 (×2): 40 mg via ORAL
  Filled 2020-12-24 (×4): qty 1

## 2020-12-24 MED ORDER — ASPIRIN EC 81 MG PO TBEC
81.0000 mg | DELAYED_RELEASE_TABLET | Freq: Every day | ORAL | Status: DC
Start: 2020-12-25 — End: 2020-12-27
  Administered 2020-12-25 – 2020-12-27 (×3): 81 mg via ORAL
  Filled 2020-12-24 (×4): qty 1

## 2020-12-24 MED ORDER — IOHEXOL 350 MG/ML SOLN
75.0000 mL | Freq: Once | INTRAVENOUS | Status: AC | PRN
  Administered 2020-12-24: 75 mL via INTRAVENOUS

## 2020-12-24 MED ORDER — STROKE: EARLY STAGES OF RECOVERY BOOK
Freq: Once | Status: AC
  Filled 2020-12-24: qty 1

## 2020-12-24 MED ORDER — MAGNESIUM OXIDE 400 (241.3 MG) MG PO TABS
200.0000 mg | ORAL_TABLET | Freq: Every morning | ORAL | Status: DC
  Administered 2020-12-25 – 2020-12-27 (×3): 200 mg via ORAL
  Filled 2020-12-24 (×3): qty 1

## 2020-12-24 MED ORDER — OMEGA-3-ACID ETHYL ESTERS 1 G PO CAPS
1.0000 g | ORAL_CAPSULE | Freq: Two times a day (BID) | ORAL | Status: DC
  Administered 2020-12-24 – 2020-12-27 (×6): 1 g via ORAL
  Filled 2020-12-24 (×7): qty 1

## 2020-12-24 MED ORDER — SODIUM CHLORIDE 0.9 % IV SOLN: INTRAVENOUS | Status: DC

## 2020-12-24 MED ORDER — ALBUTEROL SULFATE HFA 108 (90 BASE) MCG/ACT IN AERS
2.0000 | INHALATION_SPRAY | Freq: Four times a day (QID) | RESPIRATORY_TRACT | Status: DC | PRN
  Filled 2020-12-24: qty 6.7

## 2020-12-24 MED ORDER — SODIUM CHLORIDE 0.9% FLUSH: 3.0000 mL | Freq: Once | INTRAVENOUS | Status: DC

## 2020-12-24 MED ORDER — ACETAMINOPHEN 650 MG RE SUPP: 650.0000 mg | RECTAL | Status: DC | PRN

## 2020-12-24 MED ORDER — ADULT MULTIVITAMIN W/MINERALS CH
1.0000 | ORAL_TABLET | Freq: Every morning | ORAL | Status: DC
  Administered 2020-12-25 – 2020-12-27 (×3): 1 via ORAL
  Filled 2020-12-24 (×3): qty 1

## 2020-12-24 MED ORDER — APIXABAN 5 MG PO TABS
5.0000 mg | ORAL_TABLET | Freq: Two times a day (BID) | ORAL | Status: DC
  Administered 2020-12-24 – 2020-12-26 (×4): 5 mg via ORAL
  Filled 2020-12-24 (×4): qty 1

## 2020-12-24 MED ORDER — ACETAMINOPHEN 325 MG PO TABS: 650.0000 mg | ORAL_TABLET | ORAL | Status: DC | PRN

## 2020-12-24 MED ORDER — ACETAMINOPHEN 160 MG/5ML PO SOLN: 650.0000 mg | ORAL | Status: DC | PRN

## 2020-12-24 NOTE — H&P (Addendum)
History and Physical    [name] TIW:[id number] DOB: [birth year] DOA: 12/24/2020  PCP: Patient, No Pcp Per  Patient coming from: Home.  Chief Complaint: Difficulty speaking.  HPI: [name] is a 80 y.o. male with history of TIA underwent carotid endarterectomy in November 2021 with history of proximal atrial fibrillation and history of pacemaker placement presently on Eliquis had a fall today when patient was trying to get caught for his granddaughter.  After the fall patient states he did well and went back home and had a bruise on his left forearm which was getting cleaned by his wife.  When his wife noticed that patient had about 15 minutes of difficulty speaking where patient was not able to bring out words.  Did not have any weakness of the extremities or any visual symptoms.  Symptoms resolved by the time patient reached the ER.  ED Course: In the ER CT angiogram of the head and neck and CT head was unremarkable.  Neurologist on-call was consulted admitted for further observation.  Patient passed swallow evaluation.  On exam patient is able to move all extremities.  Patient does have a skin tear on the left upper extremity.  EKG shows normal sinus rhythm.  Labs show hemoglobin of 12.7 creatinine 1.2 Covid test negative.  Review of Systems: As per HPI, rest all negative.   Past Medical History:  Diagnosis Date  . Carotid artery occlusion   . GERD (gastroesophageal reflux disease)   . Hypertension   . Stroke [facility])    TIA    Past Surgical History:  Procedure Laterality Date  . CATARACT EXTRACTION, BILATERAL    . ENDARTERECTOMY Left 09/16/2020   Procedure: LEFT ENDARTERECTOMY CAROTID;  Surgeon: Maeola Harman, MD;  Location: [facility] OR;  Service: Vascular;  Laterality: Left;  . HERNIA REPAIR    . PACEMAKER IMPLANT    . TONSILLECTOMY       reports that he has quit smoking. He has never used smokeless tobacco. He reports current alcohol use. No history on file for drug  use.  No Known Allergies  Family History  Family history unknown: Yes    Prior to Admission medications   Medication Sig Start Date End Date Taking? Authorizing Provider  albuterol (VENTOLIN HFA) 108 (90 Base) MCG/ACT inhaler Inhale 2 puffs into the lungs 4 (four) times daily as needed for wheezing or shortness of breath.    [provider]  apixaban (ELIQUIS) 5 MG TABS tablet Take 1 tablet (5 mg total) by mouth 2 (two) times daily. 09/14/20   Amin, Loura Halt, MD  aspirin EC 81 MG tablet Take 1 tablet (81 mg total) by mouth daily. Swallow whole. 09/17/20 09/17/21  Burnadette Pop, MD  atorvastatin (LIPITOR) 40 MG tablet Take 1 tablet (40 mg total) by mouth daily. 09/18/20   Burnadette Pop, MD  cholecalciferol (VITAMIN D) 25 MCG (1000 UNIT) tablet Take 1,000 Units by mouth at bedtime.    [provider]  Fluticasone Furoate (ARNUITY ELLIPTA) 200 MCG/ACT AEPB Inhale 1 puff into the lungs every morning.    [provider]  Magnesium 250 MG TABS Take 250 mg by mouth every morning.    [provider]  metoprolol tartrate (LOPRESSOR) 50 MG tablet Take 25 mg by mouth daily. Half tab of 50mg      [provider]  Multiple Vitamin (MULTIVITAMIN WITH MINERALS) TABS tablet Take 1 tablet by mouth every morning.    [provider]  Omega-3 Fatty Acids (FISH  OIL PO) Take 1 capsule by mouth every morning.    [provider]  omeprazole (PRILOSEC) 40 MG capsule Take 40 mg by mouth daily before supper.    [provider]    Physical Exam: Constitutional: Moderately built and nourished. Vitals:   12/24/20 1815 12/24/20 1830 12/24/20 1915 12/24/20 2023  BP: (!) 151/67 136/73 138/70 139/70  Pulse: (!) 59 60 60 61  Resp: 14 13 13 15   Temp:      TempSrc:      SpO2: 100% 100% 99% 100%  Weight:      Height:       Eyes: Anicteric no pallor. ENMT: No discharge from the ears eyes nose or mouth. Neck: No mass felt.  No neck  rigidity. Respiratory: No rhonchi or crepitations. Cardiovascular: S1-S2 heard. Abdomen: Soft nontender bowel sounds present. Musculoskeletal: Left upper extremity under dressing. Skin: Left upper extremity in a dressing for skin tear. Neurologic: Alert awake oriented to time place and person.  Moving all extremities 5 x 5.  No facial asymmetry tongue is midline pupils are equal and reacting to light. Psychiatric: Appears normal.  Normal affect.   Labs on Admission: I have personally reviewed following labs and imaging studies  CBC: Recent Labs  Lab 12/24/20 1712 12/24/20 1724  WBC 4.9  --   NEUTROABS 2.3  --   HGB 12.7* 12.6*  HCT 38.2* 37.0*  MCV 96.5  --   PLT 160  --    Basic Metabolic Panel: Recent Labs  Lab 12/24/20 1712 12/24/20 1724  NA 138 138  K 4.8 4.7  CL 105 104  CO2 21*  --   GLUCOSE 103* 104*  BUN 25* 27*  CREATININE 1.28* 1.30*  CALCIUM 9.2  --    GFR: Estimated Creatinine Clearance: 46.8 mL/min (A) (by C-G formula based on SCr of 1.3 mg/dL (H)). Liver Function Tests: Recent Labs  Lab 12/24/20 1712  AST 29  ALT 34  ALKPHOS 103  BILITOT 1.0  PROT 6.4*  ALBUMIN 3.8   No results for input(s): LIPASE, AMYLASE in the last 168 hours. No results for input(s): AMMONIA in the last 168 hours. Coagulation Profile: Recent Labs  Lab 12/24/20 1712  INR 1.2   Cardiac Enzymes: No results for input(s): CKTOTAL, CKMB, CKMBINDEX, TROPONINI in the last 168 hours. BNP (last 3 results) No results for input(s): PROBNP in the last 8760 hours. HbA1C: No results for input(s): HGBA1C in the last 72 hours. CBG: Recent Labs  Lab 12/24/20 1713  GLUCAP 98   Lipid Profile: No results for input(s): CHOL, HDL, LDLCALC, TRIG, CHOLHDL, LDLDIRECT in the last 72 hours. Thyroid Function Tests: No results for input(s): TSH, T4TOTAL, FREET4, T3FREE, THYROIDAB in the last 72 hours. Anemia Panel: No results for input(s): VITAMINB12, FOLATE, FERRITIN, TIBC, IRON,  RETICCTPCT in the last 72 hours. Urine analysis:    Component Value Date/Time   COLORURINE STRAW (A) 09/12/2020 0052   APPEARANCEUR CLEAR 09/12/2020 0052   LABSPEC 1.017 09/12/2020 0052   PHURINE 7.0 09/12/2020 0052   GLUCOSEU NEGATIVE 09/12/2020 0052   HGBUR NEGATIVE 09/12/2020 0052   BILIRUBINUR NEGATIVE 09/12/2020 0052   KETONESUR NEGATIVE 09/12/2020 0052   PROTEINUR NEGATIVE 09/12/2020 0052   NITRITE NEGATIVE 09/12/2020 0052   LEUKOCYTESUR NEGATIVE 09/12/2020 0052   Sepsis Labs: @LABRCNTIP (procalcitonin:4,lacticidven:4) )No results found for this or any previous visit (from the past 240 hour(s)).   Radiological Exams on Admission: CT ANGIO HEAD W OR WO CONTRAST  Addendum Date: 12/24/2020  ADDENDUM REPORT: 12/24/2020 18:37 ADDENDUM: No large vessel occlusion. These results were called by telephone at the time of interpretation on 12/24/2020 at 5:55 pm to provider [facility] , who verbally acknowledged these results. Dr. 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 Electronically Signed   By: Jackey LogeKyle  Golden DO   On: 12/24/2020 18:37   Result Date: 12/24/2020 CLINICAL DATA:  Code stroke.  Aphasia, now nearly resolved. EXAM: CT ANGIOGRAPHY HEAD AND NECK TECHNIQUE: Multidetector CT imaging of the head and neck was performed using the standard protocol during bolus administration of intravenous contrast. Multiplanar CT image reconstructions and MIPs were obtained to evaluate the vascular anatomy. Carotid stenosis measurements (when applicable) are obtained utilizing NASCET criteria, using the distal internal carotid diameter as the denominator. CONTRAST:  75mL OMNIPAQUE IOHEXOL 350 MG/ML SOLN COMPARISON:  CT angiogram head/neck 09/14/2020. FINDINGS: CTA NECK FINDINGS Aortic arch: Standard aortic branching. Atherosclerotic plaque within the visualized aortic arch and  proximal major branch vessels of the neck. No hemodynamically significant innominate or proximal subclavian artery stenosis. Right carotid system: CCA and ICA patent within the neck without significant stenosis (50% or greater). Mild to moderate calcified plaque within the carotid bifurcation and proximal ICA. Left carotid system: Interval left carotid endarterectomy. CCA and ICA patent within the neck without stenosis. 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 No more than mild vessel narrowing at this site Vertebral arteries: Patent within the neck bilaterally. Left vertebral artery dominant. Redemonstrated mild/moderate stenosis at the origin of the left vertebral artery. Skeleton: Cervical dextrocurvature. Reversal of the expected cervical lordosis. 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 No acute bony abnormality or aggressive osseous lesion. Other neck: Redemonstrated enlarged heterogeneous right thyroid lobe. Upper chest: No consolidation within the imaged lung apices. Review of the MIP images confirms the above findings CTA HEAD FINDINGS Anterior circulation: The intracranial internal carotid arteries are patent. Calcified plaque within both vessels with no more than mild stenosis. The M1 middle cerebral arteries are patent. No M2 proximal branch occlusion or high-grade proximal stenosis is identified. The anterior cerebral arteries are patent. No intracranial aneurysm is identified. Posterior circulation: The intracranial vertebral arteries are patent. Calcified plaque within the V4 left vertebral artery with mild stenosis. The basilar artery is patent. The posterior cerebral arteries are patent. A left posterior communicating artery is present. The right posterior communicating artery is hypoplastic or absent Venous sinuses: As permitted by contrast timing, patent. Anatomic variants: As described Review of the MIP  images confirms the above findings IMPRESSION: CTA neck: 1. Interval left carotid endarterectomy. The left common and internal carotid arteries are now patent within the neck without stenosis. 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 Right common carotid and internal carotid arteries patent within the neck without hemodynamically significant stenosis. Unchanged mild to moderate calcified plaque within the carotid bifurcation and proximal ICA. 3. Vertebral arteries patent within the neck. Unchanged mild/moderate atherosclerotic narrowing at the origin of the left vertebral artery 4. Redemonstrated enlarged heterogeneous right thyroid lobe. Nonemergent thyroid ultrasound is recommended for further evaluation. CTA head: 1. No intracranial large vessel occlusion or proximal high-grade arterial stenosis. 2. Calcified plaque within the intracranial ICAs with no more than mild  stenosis. 3. Calcified plaque within the V4 left vertebral artery with mild stenosis at this site. Electronically Signed: By: Jackey Loge DO On: 12/24/2020 18:06   CT HEAD CODE STROKE WO CONTRAST  Result Date: 12/24/2020 CLINICAL DATA:  Code stroke.  Aphasia, now nearly resolved. EXAM: CT HEAD WITHOUT CONTRAST TECHNIQUE: Contiguous axial images were obtained from the base of the skull through the vertex without intravenous contrast. COMPARISON:  Prior head CT 09/14/2020, CT angiogram head/neck 09/14/2020. FINDINGS: Brain: Mild cerebral and cerebellar atrophy. Moderate patchy and confluent ill-defined hypoattenuation within the cerebral white matter is nonspecific, but compatible with chronic small vessel ischemic disease. There is no acute intracranial hemorrhage. No demarcated cortical infarct. No extra-axial fluid collection. No evidence of intracranial mass. No midline shift. Vascular: No hyperdense vessel.   Atherosclerotic calcifications Skull: Normal. Negative for fracture or focal lesion. Sinuses/Orbits: Visualized orbits show no acute finding. No significant paranasal sinus disease at the imaged levels ASPECTS [facility] Stroke Program Early CT Score) - Ganglionic level infarction (caudate, lentiform nuclei, internal capsule, insula, M1-M3 cortex): 7 - Supraganglionic infarction (M4-M6 cortex): 3 Total score (0-10 with 10 being normal): 10 These results were called by telephone at the time of interpretation on 12/24/2020 at 5:36 pm to provider Dr. Otelia Limes, who verbally acknowledged these results. IMPRESSION: No evidence of acute intracranial hemorrhage or acute demarcated cortical infarction. 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 Electronically Signed   By: Jackey Loge DO   On: 12/24/2020 17:41   CT ANGIO NECK CODE STROKE  Addendum Date: 12/24/2020   ADDENDUM REPORT: 12/24/2020 18:37 ADDENDUM: No large vessel occlusion. These results were called by telephone at the time of interpretation on 12/24/2020 at 5:55 pm to provider ERIC [facility] , who verbally acknowledged these results. Dr. 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 Electronically Signed   By: Jackey Loge DO   On: 12/24/2020 18:37   Result Date: 12/24/2020 CLINICAL DATA:  Code stroke.  Aphasia, now nearly resolved. EXAM: CT ANGIOGRAPHY HEAD AND NECK TECHNIQUE: Multidetector CT imaging of the head and neck was performed using the standard protocol during bolus administration of intravenous contrast. Multiplanar CT image reconstructions and MIPs were obtained to evaluate the vascular anatomy. Carotid stenosis measurements (when applicable) are obtained utilizing NASCET criteria, using the distal internal  carotid diameter as the denominator. CONTRAST:  61mL OMNIPAQUE IOHEXOL 350 MG/ML SOLN COMPARISON:  CT angiogram head/neck 09/14/2020. FINDINGS: CTA NECK FINDINGS Aortic arch: Standard aortic branching. Atherosclerotic plaque within the visualized aortic arch and proximal major branch vessels of the neck. No hemodynamically significant innominate or proximal subclavian artery stenosis. Right carotid system: CCA and ICA patent within the neck without significant stenosis (50% or greater). Mild to moderate calcified plaque within the carotid bifurcation and proximal ICA. Left carotid system: Interval left carotid endarterectomy. CCA and ICA patent within the neck without stenosis. 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 No more than mild vessel narrowing at this site Vertebral arteries: Patent within the neck bilaterally. Left vertebral artery dominant. Redemonstrated mild/moderate stenosis at the origin of the left vertebral artery. Skeleton: Cervical dextrocurvature. Reversal of the expected cervical lordosis. 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 No acute bony abnormality or aggressive  osseous lesion. Other neck: Redemonstrated enlarged heterogeneous right thyroid lobe. Upper chest: No consolidation within the imaged lung apices. Review of the MIP images confirms the above findings CTA HEAD FINDINGS Anterior circulation: The intracranial internal carotid arteries are patent. Calcified plaque within both vessels with no more than mild stenosis. The M1 middle cerebral arteries are patent. No M2 proximal branch occlusion or high-grade proximal stenosis is identified. The anterior cerebral arteries are patent. No intracranial aneurysm is identified. Posterior circulation: The intracranial vertebral arteries are patent. Calcified plaque within the V4 left vertebral artery with mild stenosis. The basilar artery is patent. The  posterior cerebral arteries are patent. A left posterior communicating artery is present. The right posterior communicating artery is hypoplastic or absent Venous sinuses: As permitted by contrast timing, patent. Anatomic variants: As described Review of the MIP images confirms the above findings IMPRESSION: CTA neck: 1. Interval left carotid endarterectomy. The left common and internal carotid arteries are now patent within the neck without stenosis. 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 Right common carotid and internal carotid arteries patent within the neck without hemodynamically significant stenosis. Unchanged mild to moderate calcified plaque within the carotid bifurcation and proximal ICA. 3. Vertebral arteries patent within the neck. Unchanged mild/moderate atherosclerotic narrowing at the origin of the left vertebral artery 4. Redemonstrated enlarged heterogeneous right thyroid lobe. Nonemergent thyroid ultrasound is recommended for further evaluation. CTA head: 1. No intracranial large vessel occlusion or proximal high-grade arterial stenosis. 2. Calcified plaque within the intracranial ICAs with no more than mild stenosis. 3. Calcified plaque within the V4 left vertebral artery with mild stenosis at this site. Electronically Signed: By: Jackey Loge DO On: 12/24/2020 18:06    EKG: Independently reviewed.  Normal sinus rhythm.  Assessment/Plan Principal Problem:   TIA (transient ischemic attack) Active Problems:   Presence of permanent cardiac pacemaker   Essential hypertension   PAF (paroxysmal atrial fibrillation) (HCC)    1. TIA -appreciate neurology consult and recommendations.  We will place patient on neurochecks.  Patient passed swallow.  Patient is on apixaban and aspirin and has been continued as requested by neurologist.  Patient is on statins.   Unable to do MRI because patient's pacemaker is not compatible with MRI.  2D echo.  Physical therapy consult. 2. Paroxysmal atrial fibrillation and history of pacemaker placement presently on beta-blockers and apixaban. 3. Anemia follow CBC. 4. Left carotid endarterectomy in November 2021.   DVT prophylaxis: Apixaban. Code Status: Full code. Family Communication: Patient's wife. Disposition Plan: Home when stable. Consults called: Neurology. Admission status: Observation.   Eduard Clos MD [facility] Pager [phone].  If 7PM-7AM, please contact night-coverage [email or web address] Password [facility]  12/24/2020, 9:25 PM

## 2020-12-24 NOTE — Code Documentation (Signed)
Stroke Response Nurse Documentation Code Documentation  [name] is a 80 y.o. male arriving to [facility] ED via [facility] on 12/24/2020 with past medical hx of atrial flutter with PPM, TIA, left carotid endarterectomy. Code stroke was activated by EMS. Patient from [facility] assisted living where he was LKW at [phone] and now complaining of difficulty with his speech. Per EMS, expressive aphasia improved en route. Pt states he fell today walking into the drug store around 1500. He states that he tripped on the curb as a result of not picking his foot up high enough. On Eliquis (apixaban) daily. Stroke team at the bedside on patient arrival. Labs drawn and patient cleared for CT by Dr. Hyacinth Meeker. Patient to CT with team. NIHSS 3, see documentation for details and code stroke times. Patient with left limb ataxia and left neglect on exam. Sensory neglect improved while in CT. Ataxia to his left arm and leg remained, but pt stated he injured his left arm and left knee when he fell earlier today. EDP to examine. The following imaging was completed: CT, CTA head and neck. Patient is not a candidate for tPA due to being on Eliquis.   Plan: TIA alert- q2h NIHSS & VS Bedside handoff with ED RN Baron Sane L Sheketa Ende  Rapid Response RN

## 2020-12-24 NOTE — Consult Note (Signed)
NEURO HOSPITALIST CONSULT NOTE   Requesting physician: Dr. Hyacinth Meeker  Reason for Consult: Acute onset of expressive aphasia  History obtained from:  Patient, EMS and Chart     HPI:                                                                                                                                          [name] is an 80 y.o. male with a history of carotid artery occlusion s/p left CEA, atrial flutter, HTN, pacemaker implantation and GERD, presenting to the ED via EMS as a Code Stroke after he was noted by his wife to be having difficulty speaking this afternoon. LKN was 1615. Symptoms consisted of difficulty with word finding but no comprehension deficit or dysarthria. Also without facial droop or weakness. Of note, he fell about one hour before symptom onset while at a store, abrading his LUE but not hitting his head. He is on a blood thinner at home.   Past Medical History:  Diagnosis Date  . Carotid artery occlusion   . GERD (gastroesophageal reflux disease)   . Hypertension     Past Surgical History:  Procedure Laterality Date  . ENDARTERECTOMY Left 09/16/2020   Procedure: LEFT ENDARTERECTOMY CAROTID;  Surgeon: Maeola Harman, MD;  Location: [facility] OR;  Service: Vascular;  Laterality: Left;  . HERNIA REPAIR    . PACEMAKER IMPLANT    . TONSILLECTOMY      No family history on file.           Social History:  reports that he has quit smoking. He has never used smokeless tobacco. He reports current alcohol use. No history on file for drug use.  No Known Allergies  HOME MEDICATIONS:                                                                                                                      No current facility-administered medications on file prior to encounter.   Current Outpatient Medications on File Prior to Encounter  Medication Sig Dispense Refill  . albuterol (VENTOLIN HFA) 108 (90 Base) MCG/ACT inhaler Inhale 2 puffs into  the lungs 4 (four) times daily as needed for wheezing or shortness of breath.    Marland Kitchen apixaban (ELIQUIS) 5 MG TABS  tablet Take 1 tablet (5 mg total) by mouth 2 (two) times daily. 60 tablet 1  . aspirin EC 81 MG tablet Take 1 tablet (81 mg total) by mouth daily. Swallow whole. 30 tablet 1  . atorvastatin (LIPITOR) 40 MG tablet Take 1 tablet (40 mg total) by mouth daily. 30 tablet 1  . cholecalciferol (VITAMIN D) 25 MCG (1000 UNIT) tablet Take 1,000 Units by mouth at bedtime.    . Fluticasone Furoate (ARNUITY ELLIPTA) 200 MCG/ACT AEPB Inhale 1 puff into the lungs every morning.    . Magnesium 250 MG TABS Take 250 mg by mouth every morning.    . metoprolol tartrate (LOPRESSOR) 50 MG tablet Take 25 mg by mouth daily. Half tab of 50mg      . Multiple Vitamin (MULTIVITAMIN WITH MINERALS) TABS tablet Take 1 tablet by mouth every morning.    . Omega-3 Fatty Acids (FISH OIL PO) Take 1 capsule by mouth every morning.    omeprazole (PRILOSEC) 40 MG capsule Take 40 mg by mouth daily before supper.       ROS:                                                                                                                                       As per HPI. Detailed ROS deferred due to acuity of presentation.   Weight 76.5 kg.   General Examination:                                                                                                       Physical Exam  HEENT-  [place]/AT    Lungs- Respirations unlabored Extremities- Warm and well perfused  Neurological Examination Mental Status: Alert, fully oriented, thought content appropriate.  Speech fluent with the exception of subtle word finding deficit. Comprehension intact; able to follow all commands. Repetition intact.  Cranial Nerves: II: Visual fields intact.   III,IV, VI: No ptosis. EOMI.  V,VII: Smile symmetric, facial light touch sensation equal bilaterally VIII: hearing intact to voice IX,X: No hypophonia XI: Symmetric XII: Midline tongue  extension Motor: Right : Upper extremity   5/5    Left:     Upper extremity   5/5  Lower extremity   5/5     Lower extremity   5/5 No pronator drift.  Sensory: Temp and light touch intact when limbs are tested individually, but with extinction to LUE and LLE with DSS Deep Tendon Reflexes: 2+ bilateral brachioradialis, biceps, patellar and achilles.  Cerebellar: No ataxia with FNF bilaterally. Dyssinergia with H-S bilaterally  Gait: Deferred   Lab Results: Basic Metabolic Panel: No results for input(s): NA, K, CL, CO2, GLUCOSE, BUN, CREATININE, CALCIUM, MG, PHOS in the last 168 hours.  CBC: Recent Labs  Lab 12/24/20 1712  WBC 4.9  NEUTROABS 2.3  HGB 12.7*  HCT 38.2*  MCV 96.5  PLT 160    Cardiac Enzymes: No results for input(s): CKTOTAL, CKMB, CKMBINDEX, TROPONINI in the last 168 hours.  Lipid Panel: No results for input(s): CHOL, TRIG, HDL, CHOLHDL, VLDL, LDLCALC in the last 168 hours.  Imaging: No results found.  Assessment: 80 year old male presenting with acute onset of expressive aphasia 1. Exam reveals left sided sensory extinction and essentially normal speech except for minimal difficulty with naming objects presented to him  2. CT head with chronic small vessel ischemic changes, somewhat more prominent in the left basal ganglia. The latter finding was on prior CT scan from this fall, but there may be subtle increased prominence on the current study. Will need MRI brain to fully assess.  3. CTA of head and neck: No LVO or proximal high-grade arterial stenosis. Calcified plaque within the intracranial ICAs with no more than mild stenosis. Calcified plaque within the V4 left vertebral artery with mild stenosis at this site. 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   Recommendations: 1. HgbA1c, fasting lipid panel 2. MRI of the brain without contrast 3. PT consult, OT consult, Speech  consult 4. TTE 5. Continue ASA and Eliquis.  6. Risk factor modification 7. Telemetry monitoring 8. Frequent neuro checks 9. Permissive HTN x 24 hours with modified parameters due to advanced age: Treat if SBP > 180.  10 NPO until passes stroke swallow screen 11. Stroke team to follow in the AM.   Electronically signed: Dr. Caryl Pina 12/24/2020, 5:32 PM

## 2020-12-24 NOTE — ED Triage Notes (Addendum)
Pt arrived to ED via EMS as a CODE STROKE. LKW 1615. Pt is from [place] [facility]. Pt was out by himself getting his granddaughter a valentine when he tripped over a curb stepping up on the sidewalk. Pt denies hitting his head, but that impact was on his L arm and L knee. Pt has large skin tear to L arm. Pt drove home and upon arrival to his home pt's wife cleaned his wounds and pt reports feeling lightheaded and she called his doctor and was told to call EMS. EMS reports upon arrival to pt's home that pt was having expressive aphasia that slowly improved en route. Pt has hx of multiple TIAs. Pt is on Eliquis.  EMS IV's 18 L AC 18 R FA VS: 98% RA, BP 140/70 HR 70, CBG 115

## 2020-12-24 NOTE — Telephone Encounter (Signed)
Patient's wife left a VM at 1626 that the patient fell in a store. Patient reports that he did not hit his head, but wife says there is a scrape on his face. He drove home and was okay, but then was dizzy "and a bit out of it". He is now responding appropriately. Wife says that patient refuses to call an ambulance. He is s/p CEA in November and taking Eliquis. Called back and left VM that our office was closed for the weekend and strongly suggested patient be seen today for evaluation either by ED or Urgent Care, but that someone should evaluate him today.

## 2020-12-24 NOTE — ED Provider Notes (Signed)
[facility] EMERGENCY DEPARTMENT Provider Note   CSN: [id number] Arrival date & time: 12/24/20  1708  An emergency department physician performed an initial assessment on this suspected stroke patient at 1712.  History Chief Complaint  Patient presents with  . Code Stroke    [name] is a 80 y.o. male.  HPI   This patient is an 80 year old male, he has a known history of a prior carotid obstruction requiring endarterectomy, this was associated with a TIA, he has had TIAs in the past, he does have atrial flutter and is on Eliquis.  He presents today from his assisted care facility where he currently resides over the last 4 months since moving here from [place].  There was report of having a fall earlier in the day, he was able to drive himself back home, was then having difficulty with word finding.  His last seen normal was at approximately 3:00 PM.  He arrives as a code stroke.  The patient states he was having some difficulty finding words, he denies pain, denies changes in vision, denies changes in strength or sensation that he is aware of.  Symptoms were acute in onset, seem to have improved according to the paramedics who report that he is much better at this time with regards to his speech.  There was no significant abnormal vital signs prehospital.  Prior electronic medical record was reviewed, the patient was seen in the office in December by neurology, had been admitted to the hospital in November with a transient ischemic attack and underwent surgery on 4 November.   Past Medical History:  Diagnosis Date  . Carotid artery occlusion   . GERD (gastroesophageal reflux disease)   . Hypertension   . Stroke [facility])    TIA    Patient Active Problem List   Diagnosis Date Noted  . 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 Essential hypertension 09/12/2020  . Anemia 09/12/2020  . Aphasia 09/12/2020  . TIA (transient ischemic attack)  09/12/2020    Past Surgical History:  Procedure Laterality Date  . CATARACT EXTRACTION, BILATERAL    . ENDARTERECTOMY Left 09/16/2020   Procedure: LEFT ENDARTERECTOMY CAROTID;  Surgeon: Maeola Harman, MD;  Location: [facility] OR;  Service: Vascular;  Laterality: Left;  . HERNIA REPAIR    . PACEMAKER IMPLANT    . TONSILLECTOMY         History reviewed. No pertinent family history.  Social History   Tobacco Use  . Smoking status: Former Games developer  . Smokeless tobacco: Never Used  Vaping Use  . Vaping Use: Never used  Substance Use Topics  . Alcohol use: Yes    Comment: occassional    Home Medications Prior to Admission medications   Medication Sig Start Date End Date Taking? Authorizing Provider  albuterol (VENTOLIN HFA) 108 (90 Base) MCG/ACT inhaler Inhale 2 puffs into the lungs 4 (four) times daily as needed for wheezing or shortness of breath.    [provider]  apixaban (ELIQUIS) 5 MG TABS tablet Take 1 tablet (5 mg total) by mouth 2 (two) times daily. 09/14/20   Amin, Loura Halt, MD  aspirin EC 81 MG tablet Take 1 tablet (81 mg total) by mouth daily. Swallow whole. 09/17/20 09/17/21  Burnadette Pop, MD  atorvastatin (LIPITOR) 40 MG tablet Take 1 tablet (40 mg total) by mouth daily. 09/18/20   Burnadette Pop, MD  cholecalciferol (VITAMIN D) 25 MCG (1000 UNIT) tablet Take 1,000 Units by mouth at  bedtime.    [provider]  Fluticasone Furoate (ARNUITY ELLIPTA) 200 MCG/ACT AEPB Inhale 1 puff into the lungs every morning.    [provider]  Magnesium 250 MG TABS Take 250 mg by mouth every morning.    [provider]  metoprolol tartrate (LOPRESSOR) 50 MG tablet Take 25 mg by mouth daily. Half tab of 50mg      [provider]  Multiple Vitamin (MULTIVITAMIN WITH MINERALS) TABS tablet Take 1 tablet by mouth every morning.    [provider]  Omega-3 Fatty Acids (FISH OIL PO) Take 1 capsule by mouth every morning.     [provider]  omeprazole (PRILOSEC) 40 MG capsule Take 40 mg by mouth daily before supper.    [provider]    Allergies    Patient has no known allergies.  Review of Systems   Review of Systems  All other systems reviewed and are negative.   Physical Exam Updated Vital Signs BP 139/70   Pulse 61   Temp 98 F (36.7 C) (Oral)   Resp 15   Ht 1.778 m (5\' 10" )   Wt 76.5 kg   SpO2 100%   BMI 24.20 kg/m   Physical Exam Vitals and nursing note reviewed.  Constitutional:      General: He is not in acute distress.    Appearance: He is well-developed and well-nourished.  HENT:     Head: Normocephalic and atraumatic.     Mouth/Throat:     Mouth: Oropharynx is clear and moist.     Pharynx: No oropharyngeal exudate.  Eyes:     General: No scleral icterus.       Right eye: No discharge.        Left eye: No discharge.     Extraocular Movements: EOM normal.     Conjunctiva/sclera: Conjunctivae normal.     Pupils: Pupils are equal, round, and reactive to light.  Neck:     Thyroid: No thyromegaly.     Vascular: No JVD.  Cardiovascular:     Rate and Rhythm: Normal rate and regular rhythm.     Pulses: Intact distal pulses.     Heart sounds: Normal heart sounds. No murmur heard. No friction rub. No gallop.   Pulmonary:     Effort: Pulmonary effort is normal. No respiratory distress.     Breath sounds: Normal breath sounds. No wheezing or rales.  Abdominal:     General: Bowel sounds are normal. There is no distension.     Palpations: Abdomen is soft. There is no mass.     Tenderness: There is no abdominal tenderness.  Musculoskeletal:        General: No tenderness or edema. Normal range of motion.     Cervical back: Normal range of motion and neck supple.     Comments: Despite skin tears and abrasions and contusions there is totally normal range of motion of all joints of the left and right upper extremities as well as the bilateral hips.   Lymphadenopathy:     Cervical: No cervical adenopathy.  Skin:    General: Skin is warm and dry.     Findings: No erythema or rash.     Comments: Skin tears over the left elbow, abrasion over the left shoulder  Neurological:     Mental Status: He is alert.     Coordination: Coordination normal.     Comments: Cranial nerves III through XII are intact.  He has no difficulty  with word finding or speech, he has equal grips bilaterally, equal sensation bilaterally however he does have extinction to the left side.  He is able to follow commands perfectly, he is able to name objects perfectly, he has memory which is intact.  There is no pronator drift.  Psychiatric:        Mood and Affect: Mood and affect normal.        Behavior: Behavior normal.     ED Results / Procedures / Treatments   Labs (all labs ordered are listed, but only abnormal results are displayed) Labs Reviewed  APTT - Abnormal; Notable for the following components:      Result Value   aPTT 37 (*)    All other components within normal limits  CBC - Abnormal; Notable for the following components:   RBC 3.96 (*)    Hemoglobin 12.7 (*)    HCT 38.2 (*)    All other components within normal limits  COMPREHENSIVE METABOLIC PANEL - Abnormal; Notable for the following components:   CO2 21 (*)    Glucose, Bld 103 (*)    BUN 25 (*)    Creatinine, Ser 1.28 (*)    Total Protein 6.4 (*)    GFR, Estimated 57 (*)    All other components within normal limits  I-STAT CHEM 8, ED - Abnormal; Notable for the following components:   BUN 27 (*)    Creatinine, Ser 1.30 (*)    Glucose, Bld 104 (*)    Calcium, Ion 1.11 (*)    Hemoglobin 12.6 (*)    HCT 37.0 (*)    All other components within normal limits  PROTIME-INR  DIFFERENTIAL  CBG MONITORING, ED    EKG EKG Interpretation  Date/Time:  Friday December 24 2020 18:01:53 EST Ventricular Rate:  60 PR Interval:    QRS Duration: 110 QT Interval:  419 QTC Calculation: 419 R  Axis:   25 Text Interpretation: Sinus rhythm Abnormal R-wave progression, early transition no pacing spikes seen Confirmed by Eber Hong ([id number]) on 12/24/2020 6:07:09 PM   Radiology CT ANGIO HEAD W OR WO CONTRAST  Addendum Date: 12/24/2020   ADDENDUM REPORT: 12/24/2020 18:37 ADDENDUM: No large vessel occlusion. These results were called by telephone at the time of interpretation on 12/24/2020 at 5:55 pm to provider ERIC [facility] , who verbally acknowledged these results. Dr. 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 Electronically Signed   By: Jackey Loge DO   On: 12/24/2020 18:37   Result Date: 12/24/2020 CLINICAL DATA:  Code stroke.  Aphasia, now nearly resolved. EXAM: CT ANGIOGRAPHY HEAD AND NECK TECHNIQUE: Multidetector CT imaging of the head and neck was performed using the standard protocol during bolus administration of intravenous contrast. Multiplanar CT image reconstructions and MIPs were obtained to evaluate the vascular anatomy. Carotid stenosis measurements (when applicable) are obtained utilizing NASCET criteria, using the distal internal carotid diameter as the denominator. CONTRAST:  39mL OMNIPAQUE IOHEXOL 350 MG/ML SOLN COMPARISON:  CT angiogram head/neck 09/14/2020. FINDINGS: CTA NECK FINDINGS Aortic arch: Standard aortic branching. Atherosclerotic plaque within the visualized aortic arch and proximal major branch vessels of the neck. No hemodynamically significant innominate or proximal subclavian artery stenosis. Right carotid system: CCA and ICA patent within the neck without significant stenosis (50% or greater). Mild to moderate calcified plaque within the carotid bifurcation and proximal ICA. Left carotid system: Interval left  carotid endarterectomy. CCA and ICA patent within the neck without stenosis. New from the prior exam, there is an apparent intimal  flap within the proximal ECA, likely reflecting a dissection. No more than mild vessel narrowing at this site Vertebral arteries: Patent within the neck bilaterally. Left vertebral artery dominant. Redemonstrated mild/moderate stenosis at the origin of the left vertebral artery. Skeleton: Cervical dextrocurvature. Reversal of the expected cervical lordosis. 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 No acute bony abnormality or aggressive osseous lesion. Other neck: Redemonstrated enlarged heterogeneous right thyroid lobe. Upper chest: No consolidation within the imaged lung apices. Review of the MIP images confirms the above findings CTA HEAD FINDINGS Anterior circulation: The intracranial internal carotid arteries are patent. Calcified plaque within both vessels with no more than mild stenosis. The M1 middle cerebral arteries are patent. No M2 proximal branch occlusion or high-grade proximal stenosis is identified. The anterior cerebral arteries are patent. No intracranial aneurysm is identified. Posterior circulation: The intracranial vertebral arteries are patent. Calcified plaque within the V4 left vertebral artery with mild stenosis. The basilar artery is patent. The posterior cerebral arteries are patent. A left posterior communicating artery is present. The right posterior communicating artery is hypoplastic or absent Venous sinuses: As permitted by contrast timing, patent. Anatomic variants: As described Review of the MIP images confirms the above findings IMPRESSION: CTA neck: 1. Interval left carotid endarterectomy. The left common and internal carotid arteries are now patent within the neck without stenosis. 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 Right common carotid and internal carotid arteries patent within the neck without hemodynamically significant  stenosis. Unchanged mild to moderate calcified plaque within the carotid bifurcation and proximal ICA. 3. Vertebral arteries patent within the neck. Unchanged mild/moderate atherosclerotic narrowing at the origin of the left vertebral artery 4. Redemonstrated enlarged heterogeneous right thyroid lobe. Nonemergent thyroid ultrasound is recommended for further evaluation. CTA head: 1. No intracranial large vessel occlusion or proximal high-grade arterial stenosis. 2. Calcified plaque within the intracranial ICAs with no more than mild stenosis. 3. Calcified plaque within the V4 left vertebral artery with mild stenosis at this site. Electronically Signed: By: Jackey Loge DO On: 12/24/2020 18:06   CT HEAD CODE STROKE WO CONTRAST  Result Date: 12/24/2020 CLINICAL DATA:  Code stroke.  Aphasia, now nearly resolved. EXAM: CT HEAD WITHOUT CONTRAST TECHNIQUE: Contiguous axial images were obtained from the base of the skull through the vertex without intravenous contrast. COMPARISON:  Prior head CT 09/14/2020, CT angiogram head/neck 09/14/2020. FINDINGS: Brain: Mild cerebral and cerebellar atrophy. Moderate patchy and confluent ill-defined hypoattenuation within the cerebral white matter is nonspecific, but compatible with chronic small vessel ischemic disease. There is no acute intracranial hemorrhage. No demarcated cortical infarct. No extra-axial fluid collection. No evidence of intracranial mass. No midline shift. Vascular: No hyperdense vessel.  Atherosclerotic calcifications Skull: Normal. Negative for fracture or focal lesion. Sinuses/Orbits: Visualized orbits show no acute finding. No significant paranasal sinus disease at the imaged levels ASPECTS [facility] Stroke Program Early CT Score) - Ganglionic level infarction (caudate, lentiform nuclei, internal capsule, insula, M1-M3 cortex): 7 - Supraganglionic infarction (M4-M6 cortex): 3 Total score (0-10 with 10 being normal): 10 These results were called by telephone  at the time of interpretation on 12/24/2020 at 5:36 pm to provider Dr. Otelia Limes, who verbally acknowledged these results. IMPRESSION: No evidence of acute intracranial hemorrhage or  acute demarcated cortical infarction. 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 Electronically Signed   By: Jackey LogeKyle  Golden DO   On: 12/24/2020 17:41   CT ANGIO NECK CODE STROKE  Addendum Date: 12/24/2020   ADDENDUM REPORT: 12/24/2020 18:37 ADDENDUM: No large vessel occlusion. These results were called by telephone at the time of interpretation on 12/24/2020 at 5:55 pm to provider [facility] , who verbally acknowledged these results. Dr. 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 Electronically Signed   By: Jackey LogeKyle  Golden DO   On: 12/24/2020 18:37   Result Date: 12/24/2020 CLINICAL DATA:  Code stroke.  Aphasia, now nearly resolved. EXAM: CT ANGIOGRAPHY HEAD AND NECK TECHNIQUE: Multidetector CT imaging of the head and neck was performed using the standard protocol during bolus administration of intravenous contrast. Multiplanar CT image reconstructions and MIPs were obtained to evaluate the vascular anatomy. Carotid stenosis measurements (when applicable) are obtained utilizing NASCET criteria, using the distal internal carotid diameter as the denominator. CONTRAST:  75mL OMNIPAQUE IOHEXOL 350 MG/ML SOLN COMPARISON:  CT angiogram head/neck 09/14/2020. FINDINGS: CTA NECK FINDINGS Aortic arch: Standard aortic branching. Atherosclerotic plaque within the visualized aortic arch and proximal major branch vessels of the neck. No hemodynamically significant innominate or proximal subclavian artery stenosis. Right carotid system: CCA and ICA patent within the neck without significant stenosis (50% or  greater). Mild to moderate calcified plaque within the carotid bifurcation and proximal ICA. Left carotid system: Interval left carotid endarterectomy. CCA and ICA patent within the neck without stenosis. 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 No more than mild vessel narrowing at this site Vertebral arteries: Patent within the neck bilaterally. Left vertebral artery dominant. Redemonstrated mild/moderate stenosis at the origin of the left vertebral artery. Skeleton: Cervical dextrocurvature. Reversal of the expected cervical lordosis. 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 No acute bony abnormality or aggressive osseous lesion. Other neck: Redemonstrated enlarged heterogeneous right thyroid lobe. Upper chest: No consolidation within the imaged lung apices. Review of the MIP images confirms the above findings CTA HEAD FINDINGS Anterior circulation: The intracranial internal carotid arteries are patent. Calcified plaque within both vessels with no more than mild stenosis. The M1 middle cerebral arteries are patent. No M2 proximal branch occlusion or high-grade proximal stenosis is identified. The anterior cerebral arteries are patent. No intracranial aneurysm is identified. Posterior circulation: The intracranial vertebral arteries are patent. Calcified plaque within the V4 left vertebral artery with mild stenosis. The basilar artery is patent. The posterior cerebral arteries are patent. A left posterior communicating artery is present. The right posterior communicating artery is hypoplastic or absent Venous sinuses: As permitted by contrast timing, patent. Anatomic variants: As described Review of the MIP images confirms the above findings IMPRESSION: CTA neck: 1. Interval left carotid endarterectomy. The left common and internal carotid arteries are now patent within the neck without stenosis. 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 Right common carotid and internal carotid arteries patent within the neck without hemodynamically significant stenosis. Unchanged mild to moderate calcified plaque within the carotid bifurcation and proximal ICA. 3. Vertebral arteries  patent within the neck. Unchanged mild/moderate atherosclerotic narrowing at the origin of the left vertebral artery 4. Redemonstrated enlarged heterogeneous right thyroid lobe. Nonemergent thyroid ultrasound is recommended for further evaluation. CTA head: 1. No intracranial large vessel occlusion or proximal high-grade arterial stenosis. 2. Calcified plaque within the intracranial ICAs with no more than mild stenosis. 3. Calcified plaque within the V4 left vertebral artery with mild stenosis at this site. Electronically Signed: By: Jackey Loge DO On: 12/24/2020 18:06    Procedures Procedures   Medications Ordered in ED Medications  sodium chloride flush (NS) 0.9 % injection 3 mL (3 mLs Intravenous Not Given 12/24/20 1841)  iohexol (OMNIPAQUE) 350 MG/ML injection 75 mL (75 mLs Intravenous Contrast Given 12/24/20 1746)    ED Course  I have reviewed the triage vital signs and the nursing notes.  Pertinent labs & imaging results that were available during my care of the patient were reviewed by me and considered in my medical decision making (see chart for details).    MDM Rules/Calculators/A&P                          This patient has symptoms of a TIA or possibly a stroke given his extinction.  Neurology is at the bedside examining the patient with me, Dr. Otelia Limes will accompany the patient to the CT scan machine.  We will follow their recommendations at this point.  CTA not indicated according to neurology.  Labs reassuring, CT's seen and d/w Neuro  D/W Neurology - Dr. Otelia Limes recommends medical admission,  MRI and monitoring,  D/w DR. Toniann Fail who will admit  Final Clinical Impression(s) / ED Diagnoses Final diagnoses:  TIA (transient ischemic attack)      Eber Hong, MD 12/24/20 2053

## 2020-12-25 ENCOUNTER — Observation Stay (HOSPITAL_COMMUNITY): Payer: Medicare Other

## 2020-12-25 DIAGNOSIS — G459 Transient cerebral ischemic attack, unspecified: Secondary | ICD-10-CM

## 2020-12-25 DIAGNOSIS — I10 Essential (primary) hypertension: Secondary | ICD-10-CM | POA: Diagnosis not present

## 2020-12-25 LAB — LIPID PANEL
Cholesterol: 87 mg/dL (ref 0–200)
HDL: 39 mg/dL — ABNORMAL LOW (ref >40)
LDL Cholesterol: 34 mg/dL (ref 0–99)
Total CHOL/HDL Ratio: 2.2 RATIO
Triglycerides: 69 mg/dL (ref <150)
VLDL: 14 mg/dL (ref 0–40)

## 2020-12-25 LAB — ECHOCARDIOGRAM COMPLETE
AV Vena cont: 0.4 cm
Area-P 1/2: 2.36 cm2
Height: 70 in
S' Lateral: 2.6 cm
Weight: 2698.43 oz

## 2020-12-25 LAB — SARS CORONAVIRUS 2 (TAT 6-24 HRS): SARS Coronavirus 2: NEGATIVE

## 2020-12-25 LAB — HEMOGLOBIN A1C
Hgb A1c MFr Bld: 5.6 % (ref 4.8–5.6)
Mean Plasma Glucose: 114.02 mg/dL

## 2020-12-25 NOTE — Progress Notes (Signed)
Echocardiogram 2D Echocardiogram has been performed.  Warren Lacy Derrico Zhong 12/25/2020, 11:06 AM

## 2020-12-25 NOTE — Progress Notes (Addendum)
Physical Therapy Evaluation Patient Details Name: [name] MRN: [id number] DOB: March 12, 1941 Today's Date: 12/25/2020   History of Present Illness  Pt is an 78 male s/p fall missing the curb and hitting LUE.  15 mins later, wife reports slurred speech and difficulty speaking.  Has cleared L elbow for fracture.  PMH - pacer, HTN, TIA.  Clinical Impression  Pt was seen for mobility assessment and discussion about protecting LUE during movement.  His L elbow is now cleared for fracture but cautioned pt to avoid relying on L UE during eval.  Used min guard support and then [facility] to walk, which is more normalizing for his gait pattern.  Noted both coordination and strength changes on LLE that are contributing to need for [facility].  Follow up with therapy at his [facility] community, and provide inpt support with PT until he is discharged.  See for acute PT goals of therapy.    Follow Up Recommendations Home health PT;Outpatient PT    Equipment Recommendations  None recommended by PT    Recommendations for Other Services       Precautions / Restrictions Precautions Precautions: Fall Precaution Comments: L knee contusion Restrictions Weight Bearing Restrictions: No      Mobility  Bed Mobility               General bed mobility comments: up in chair when PT arrived    Transfers Overall transfer level: Needs assistance Equipment used: 1 person hand held assist Transfers: Sit to/from Stand Sit to Stand: Min assist         General transfer comment: pt asking PT to pull him up but with RUE on chair arm is min guard to min assist  Ambulation/Gait Ambulation/Gait assistance: Min guard Gait Distance (Feet): 200 Feet (100 x 2) Assistive device: 1 person hand held assist;Straight cane Gait Pattern/deviations: Step-through pattern;Decreased stride length;Wide base of support;Drifts right/left (tends to squat a little to accommodate his L knee)     General Gait Details: pt has  tendency to walk in a minor squat pattern but corrects it with [facility]  Stairs            Wheelchair Mobility    Modified Rankin (Stroke Patients Only) Modified Rankin (Stroke Patients Only) Pre-Morbid Rankin Score: No symptoms Modified Rankin: Slight disability     Balance Overall balance assessment: Needs assistance;History of Falls Sitting-balance support: Feet supported Sitting balance-Leahy Scale: Good     Standing balance support: Single extremity supported Standing balance-Leahy Scale: Fair Standing balance comment: fair with out cane but fair+ with cane                             Pertinent Vitals/Pain Pain Assessment: No/denies pain Pain Score: 1  Pain Location: L elbow Pain Descriptors / Indicators: Discomfort Pain Intervention(s): Repositioned;Premedicated before session;Monitored during session    Home Living Family/patient expects to be discharged to:: Private residence Living Arrangements: Spouse/significant other Available Help at Discharge: Family;Available 24 hours/day Type of Home: Independent living facility Home Access: Level entry     Home Layout: One level Home Equipment: Walker - 2 wheels;Cane - single point Additional Comments: pt has access to PT in his new home community    Prior Function Level of Independence: Independent         Comments: just completed PT a week ago and was walking fine     Hand Dominance   Dominant Hand: Right  Extremity/Trunk Assessment   Upper Extremity Assessment Upper Extremity Assessment: Defer to OT evaluation    Lower Extremity Assessment Lower Extremity Assessment: LLE deficits/detail LLE Deficits / Details: 4+ strength L hip and knee LLE Coordination: decreased gross motor (moderate incoordination)    Cervical / Trunk Assessment Cervical / Trunk Assessment: Normal  Communication   Communication: No difficulties  Cognition Arousal/Alertness: Awake/alert Behavior During  Therapy: WFL for tasks assessed/performed Overall Cognitive Status: Within Functional Limits for tasks assessed                                        General Comments General comments (skin integrity, edema, etc.): Pt is up to walk with help, but with [facility] is supervision level    Exercises     Assessment/Plan    PT Assessment Patient needs continued PT services  PT Problem List Decreased strength;Decreased activity tolerance;Decreased balance;Decreased mobility;Decreased coordination;Decreased safety awareness       PT Treatment Interventions DME instruction;Gait training;Functional mobility training;Therapeutic activities;Therapeutic exercise;Balance training;Neuromuscular re-education;Patient/family education    PT Goals (Current goals can be found in the Care Plan section)  Acute Rehab PT Goals Patient Stated Goal: to get back to walking with no help PT Goal Formulation: With patient/family Time For Goal Achievement: 01/08/21 Potential to Achieve Goals: Good    Frequency Min 3X/week   Barriers to discharge   home with wife and fully accessible home    Co-evaluation               AM-PAC PT "6 Clicks" Mobility  Outcome Measure Help needed turning from your back to your side while in a flat bed without using bedrails?: None Help needed moving from lying on your back to sitting on the side of a flat bed without using bedrails?: None Help needed moving to and from a bed to a chair (including a wheelchair)?: A Little Help needed standing up from a chair using your arms (e.g., wheelchair or bedside chair)?: A Little Help needed to walk in hospital room?: A Little Help needed climbing 3-5 steps with a railing? : A Little 6 Click Score: 20    End of Session   Activity Tolerance: Patient tolerated treatment well;Treatment limited secondary to medical complications (Comment) Patient left: in chair;with call bell/phone within reach;with family/visitor  present Nurse Communication: Mobility status PT Visit Diagnosis: Unsteadiness on feet (R26.81);Muscle weakness (generalized) (M62.81);History of falling (Z91.81);Hemiplegia and hemiparesis Hemiplegia - Right/Left: Left Hemiplegia - dominant/non-dominant: Non-dominant Hemiplegia - caused by: Unspecified    Time: 6606-3016 PT Time Calculation (min) (ACUTE ONLY): 27 min   Charges:   PT Evaluation $PT Eval Moderate Complexity: 1 Mod PT Treatments $Gait Training: 8-22 mins       Ivar Drape 12/25/2020, 1:58 PM  Samul Dada, PT MS Acute Rehab Dept. Number: [facility] [id number] and [facility] [phone]

## 2020-12-25 NOTE — Evaluation (Signed)
Occupational Therapy Evaluation Patient Details Name: [name] MRN: [id number] DOB: 1941/05/03 Today's Date: 12/25/2020    History of Present Illness Pt is an 65 male s/p fall missing the curb and hitting LUE.  15 mins later, wife reports slurred speech and difficulty speaking.  Has cleared L elbow for fracture.  PMH - pacer, HTN, TIA.   Clinical Impression   Pt PTA: pt living with spouse in ILF with occasional use of SPC and independent with ADL. Pt currently, modified independent with ADL and mobility. Pt pt's spouse in room for session and reports that she is home all of the time to assist as needed. BP in sitting: 138/66; BP in standing 137/70. No focal deficits noted- LUE elbow is bleeding, but no deficits/weakness noted. Pt does not require continued OT skilled services. OT signing off. Thank you.    Follow Up Recommendations  No OT follow up;Supervision - Intermittent    Equipment Recommendations  None recommended by OT    Recommendations for Other Services       Precautions / Restrictions Precautions Precautions: Fall Precaution Comments: L knee contusion Restrictions Weight Bearing Restrictions: No      Mobility Bed Mobility Overal bed mobility: Modified Independent             General bed mobility comments: up in chair when PT arrived    Transfers Overall transfer level: Needs assistance Equipment used: None Transfers: Sit to/from [employer] Sit to Stand: Supervision Stand pivot transfers: Min guard       General transfer comment: no AD    Balance Overall balance assessment: Needs assistance;History of Falls Sitting-balance support: Feet supported Sitting balance-Leahy Scale: Good     Standing balance support: Single extremity supported Standing balance-Leahy Scale: Fair Standing balance comment: fair with out cane but fair+ with cane                           ADL either performed or assessed with clinical  judgement   ADL Overall ADL's : Modified independent;At baseline                                       General ADL Comments: No physical assist required.     Vision Baseline Vision/History: No visual deficits Patient Visual Report: No change from baseline Vision Assessment?: No apparent visual deficits;Yes Eye Alignment: Within Functional Limits Ocular Range of Motion: Within Functional Limits Alignment/Gaze Preference: Within Defined Limits Tracking/Visual Pursuits: Able to track stimulus in all quads without difficulty     Perception     Praxis      Pertinent Vitals/Pain Pain Assessment: No/denies pain Pain Score: 1  Pain Location: L elbow Pain Descriptors / Indicators: Discomfort Pain Intervention(s): Repositioned;Premedicated before session;Monitored during session     Hand Dominance Right   Extremity/Trunk Assessment Upper Extremity Assessment Upper Extremity Assessment: Overall WFL for tasks assessed   Lower Extremity Assessment Lower Extremity Assessment: Defer to PT evaluation;LLE deficits/detail LLE Deficits / Details: weakness LLE Coordination: decreased gross motor (moderate incoordination)   Cervical / Trunk Assessment Cervical / Trunk Assessment: Normal   Communication Communication Communication: No difficulties   Cognition Arousal/Alertness: Awake/alert Behavior During Therapy: WFL for tasks assessed/performed Overall Cognitive Status: Within Functional Limits for tasks assessed  General Comments  pt's spouse in room; SPC use at home, but in room, pt did not require an AD BP in sitting: 138/66; BP in standing 137/70.    Exercises Exercises: Other exercises (LLE is 4+ strength wiht coordination changes)   Shoulder Instructions      Home Living Family/patient expects to be discharged to:: Private residence Living Arrangements: Spouse/significant other Available Help at  Discharge: Family;Available 24 hours/day Type of Home: Independent living facility Home Access: Level entry     Home Layout: One level     Bathroom Shower/Tub: Producer, television/film/video: Standard Bathroom Accessibility: Yes   Home Equipment: Environmental consultant - 2 wheels;Cane - single point   Additional Comments: pt has access to PT in his new home community      Prior Functioning/Environment Level of Independence: Independent        Comments: just completed PT a week ago and was walking fine        OT Problem List:        OT Treatment/Interventions:      OT Goals(Current goals can be found in the care plan section) Acute Rehab OT Goals Patient Stated Goal: to get back to walking with no help OT Goal Formulation: All assessment and education complete, DC therapy Potential to Achieve Goals: Good  OT Frequency:     Barriers to D/C:            Co-evaluation              AM-PAC OT "6 Clicks" Daily Activity     Outcome Measure Help from another person eating meals?: None Help from another person taking care of personal grooming?: None Help from another person toileting, which includes using toliet, bedpan, or urinal?: None Help from another person bathing (including washing, rinsing, drying)?: A Little Help from another person to put on and taking off regular upper body clothing?: None Help from another person to put on and taking off regular lower body clothing?: None 6 Click Score: 23   End of Session Equipment Utilized During Treatment: Gait belt Nurse Communication: Mobility status  Activity Tolerance: Patient tolerated treatment well Patient left: in chair;with call bell/phone within reach;with family/visitor present  OT Visit Diagnosis: Unsteadiness on feet (R26.81)                Time: 0017-4944 OT Time Calculation (min): 34 min Charges:  OT General Charges $OT Visit: 1 Visit OT Evaluation $OT Eval Moderate Complexity: 1 Mod OT Treatments $Self  Care/Home Management : 8-22 mins  Flora Lipps, OTR/L Acute Rehabilitation Services Pager: [phone] Office: [phone]   Kissy Cielo C 12/25/2020, 2:50 PM

## 2020-12-25 NOTE — Consult Note (Signed)
Reason for Consult:bleeding L elbow skin tears on anticoagulation  Referring Physician: Koden Hunzeker is an 80 y.o. male.  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 Randie Heinz, on Eliquis, hypertension, atrial flutter and stroke was out at the drugstore yesterday afternoon when he fell and scraped his left elbow.  After he arrived home, his wife noted he was having difficulty speaking and he came in as a code stroke.  He has undergone extensive work-up by Dr. Otelia Limes.  During his work-up, he was noted to have skin tears on his left elbow.  A hemostatic dressing was placed in the ED but this continued to bleed today and Dr. Allena Katz asked me to evaluate it further.  He just finished ambulating with therapies and his wife is also at the bedside.  He denies any significant pain in his elbow.  Past Medical History:  Diagnosis Date  . Carotid artery occlusion   . GERD (gastroesophageal reflux disease)   . Hypertension   . Stroke [facility])    TIA    Past Surgical History:  Procedure Laterality Date  . CATARACT EXTRACTION, BILATERAL    . ENDARTERECTOMY Left 09/16/2020   Procedure: LEFT ENDARTERECTOMY CAROTID;  Surgeon: Maeola Harman, MD;  Location: [facility] OR;  Service: Vascular;  Laterality: Left;  . HERNIA REPAIR    . PACEMAKER IMPLANT    . TONSILLECTOMY      Family History  Family history unknown: Yes    Social History:  reports that he has quit smoking. He has never used smokeless tobacco. He reports current alcohol use. No history on file for drug use.  Allergies: No Known Allergies  Medications: I have reviewed the patient's current medications.  Results for orders placed or performed during the hospital encounter of 12/24/20 (from the past 48 hour(s))  Protime-INR     Status: None   Collection Time: 12/24/20  5:12 PM  Result Value Ref Range   Prothrombin Time 14.6 11.4 - 15.2 seconds   INR 1.2 0.8 - 1.2    Comment:  (NOTE) INR goal varies based on device and disease states. Performed at [facility], [address] [place], [place] [address]   APTT     Status: Abnormal   Collection Time: 12/24/20  5:12 PM  Result Value Ref Range   aPTT 37 (H) 24 - 36 seconds    Comment:        IF BASELINE aPTT IS ELEVATED, SUGGEST PATIENT RISK ASSESSMENT BE USED TO DETERMINE APPROPRIATE ANTICOAGULANT THERAPY. Performed at [facility], [address] [place], [place] [address]   CBC     Status: Abnormal   Collection Time: 12/24/20  5:12 PM  Result Value Ref Range   WBC 4.9 4.0 - 10.5 K/uL   RBC 3.96 (L) 4.22 - 5.81 MIL/uL   Hemoglobin 12.7 (L) 13.0 - 17.0 g/dL   HCT 47.8 (L) 29.5 - 62.1 %   MCV 96.5 80.0 - 100.0 fL   MCH 32.1 26.0 - 34.0 pg   MCHC 33.2 30.0 - 36.0 g/dL   RDW 30.8 65.7 - 84.6 %   Platelets 160 150 - 400 K/uL   nRBC 0.0 0.0 - 0.2 %    Comment: Performed at [facility], [address] [place], [place] [address]  Differential     Status: None   Collection Time: 12/24/20  5:12 PM  Result Value Ref Range  Neutrophils Relative % 48 %   Neutro Abs 2.3 1.7 - 7.7 K/uL   Lymphocytes Relative 40 %   Lymphs Abs 1.9 0.7 - 4.0 K/uL   Monocytes Relative 11 %   Monocytes Absolute 0.5 0.1 - 1.0 K/uL   Eosinophils Relative 0 %   Eosinophils Absolute 0.0 0.0 - 0.5 K/uL   Basophils Relative 1 %   Basophils Absolute 0.1 0.0 - 0.1 K/uL   Immature Granulocytes 0 %   Abs Immature Granulocytes 0.01 0.00 - 0.07 K/uL    Comment: Performed at [facility], [address], [place], [place] [address]  Comprehensive metabolic panel     Status: Abnormal   Collection Time: 12/24/20  5:12 PM  Result Value Ref Range   Sodium 138 135 - 145 mmol/L   Potassium 4.8 3.5 - 5.1 mmol/L   Chloride 105 98 - 111 mmol/L   CO2 21 (L) 22 - 32 mmol/L   Glucose, Bld 103 (H) 70 - 99 mg/dL    Comment: Glucose reference range applies only to samples taken after fasting for at least 8 hours.   BUN 25  (H) 8 - 23 mg/dL   Creatinine, Ser 0.10 (H) 0.61 - 1.24 mg/dL   Calcium 9.2 8.9 - 93.2 mg/dL   Total Protein 6.4 (L) 6.5 - 8.1 g/dL   Albumin 3.8 3.5 - 5.0 g/dL   AST 29 15 - 41 U/L   ALT 34 0 - 44 U/L   Alkaline Phosphatase 103 38 - 126 U/L   Total Bilirubin 1.0 0.3 - 1.2 mg/dL   GFR, Estimated 57 (L) >60 mL/min    Comment: (NOTE) Calculated using the CKD-EPI Creatinine Equation (2021)    Anion gap 12 5 - 15    Comment: Performed at [facility], [address] [place], [place] [address]  CBG monitoring, ED     Status: None   Collection Time: 12/24/20  5:13 PM  Result Value Ref Range   Glucose-Capillary 98 70 - 99 mg/dL    Comment: Glucose reference range applies only to samples taken after fasting for at least 8 hours.  I-stat chem 8, ED     Status: Abnormal   Collection Time: 12/24/20  5:24 PM  Result Value Ref Range   Sodium 138 135 - 145 mmol/L   Potassium 4.7 3.5 - 5.1 mmol/L   Chloride 104 98 - 111 mmol/L   BUN 27 (H) 8 - 23 mg/dL   Creatinine, Ser 2.20 (H) 0.61 - 1.24 mg/dL   Glucose, Bld 254 (H) 70 - 99 mg/dL    Comment: Glucose reference range applies only to samples taken after fasting for at least 8 hours.   Calcium, Ion 1.11 (L) 1.15 - 1.40 mmol/L   TCO2 23 22 - 32 mmol/L   Hemoglobin 12.6 (L) 13.0 - 17.0 g/dL   HCT 27.0 (L) 62.3 - 76.2 %  SARS CORONAVIRUS 2 (TAT 6-24 HRS) Nasopharyngeal Nasopharyngeal Swab     Status: None   Collection Time: 12/24/20 11:54 PM   Specimen: Nasopharyngeal Swab  Result Value Ref Range   SARS Coronavirus 2 NEGATIVE NEGATIVE    Comment: (NOTE) SARS-CoV-2 target nucleic acids are NOT DETECTED.  The SARS-CoV-2 RNA is generally detectable in upper and lower respiratory specimens during the acute phase of infection. Negative results do not preclude SARS-CoV-2 infection, do not rule out co-infections with other pathogens, and should not be used as the sole basis for treatment or other patient  management decisions. Negative  results must be combined with clinical observations, patient history, and epidemiological information. The expected result is Negative.  Fact Sheet for Patients: HairSlick.no  Fact Sheet for Healthcare Providers: quierodirigir.com  This test is not yet approved or cleared by the [place] FDA and  has been authorized for detection and/or diagnosis of SARS-CoV-2 by FDA under an Emergency Use Authorization (EUA). This EUA will remain  in effect (meaning this test can be used) for the duration of the COVID-19 declaration under Se ction 564(b)(1) of the Act, 21 U.S.C. section 360bbb-3(b)(1), unless the authorization is terminated or revoked sooner.  Performed at [facility], [address], [place], [place] [address]   Hemoglobin A1c     Status: None   Collection Time: 12/25/20  3:31 AM  Result Value Ref Range   Hgb A1c MFr Bld 5.6 4.8 - 5.6 %    Comment: (NOTE) Pre diabetes:          5.7%-6.4%  Diabetes:              >6.4%  Glycemic control for   <7.0% adults with diabetes    Mean Plasma Glucose 114.02 mg/dL    Comment: Performed at [facility], [address], [place], [place] [address]  Lipid panel     Status: Abnormal   Collection Time: 12/25/20  3:31 AM  Result Value Ref Range   Cholesterol 87 0 - 200 mg/dL   Triglycerides 69 <098 mg/dL   HDL 39 (L) >11 mg/dL   Total CHOL/HDL Ratio 2.2 RATIO   VLDL 14 0 - 40 mg/dL   LDL Cholesterol 34 0 - 99 mg/dL    Comment:        Total Cholesterol/HDL:CHD Risk Coronary Heart Disease Risk Table                     Men   Women  1/2 Average Risk   3.4   3.3  Average Risk       5.0   4.4  2 X Average Risk   9.6   7.1  3 X Average Risk  23.4   11.0        Use the calculated Patient Ratio above and the CHD Risk Table to determine the patient's CHD Risk.        ATP III CLASSIFICATION (LDL):  <100     mg/dL   Optimal  914-782  mg/dL   Near or Above                     Optimal  130-159  mg/dL   Borderline  956-213  mg/dL   High  >086     mg/dL   Very High Performed at [facility], [address] [place], [place] [address]     CT ANGIO HEAD W OR WO CONTRAST  Addendum Date: 12/24/2020   ADDENDUM REPORT: 12/24/2020 18:37 ADDENDUM: No large vessel occlusion. These results were called by telephone at the time of interpretation on 12/24/2020 at 5:55 pm to provider ERIC [facility] , who verbally acknowledged these results. Dr. 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 Electronically Signed   By: Jackey Loge DO   On: 12/24/2020 18:37   Result Date: 12/24/2020 CLINICAL DATA:  Code stroke.  Aphasia, now nearly resolved. EXAM: CT ANGIOGRAPHY  HEAD AND NECK TECHNIQUE: Multidetector CT imaging of the head and neck was performed using the standard protocol during bolus administration of intravenous contrast. Multiplanar CT image reconstructions and MIPs were obtained to evaluate the vascular anatomy. Carotid stenosis measurements (when applicable) are obtained utilizing NASCET criteria, using the distal internal carotid diameter as the denominator. CONTRAST:  75mL OMNIPAQUE IOHEXOL 350 MG/ML SOLN COMPARISON:  CT angiogram head/neck 09/14/2020. FINDINGS: CTA NECK FINDINGS Aortic arch: Standard aortic branching. Atherosclerotic plaque within the visualized aortic arch and proximal major branch vessels of the neck. No hemodynamically significant innominate or proximal subclavian artery stenosis. Right carotid system: CCA and ICA patent within the neck without significant stenosis (50% or greater). Mild to moderate calcified plaque within the carotid bifurcation and proximal ICA. Left carotid system: Interval left carotid endarterectomy. CCA and ICA patent within the neck without stenosis. New from the prior exam, there is an apparent intimal flap  within the proximal ECA, likely reflecting a dissection. No more than mild vessel narrowing at this site Vertebral arteries: Patent within the neck bilaterally. Left vertebral artery dominant. Redemonstrated mild/moderate stenosis at the origin of the left vertebral artery. Skeleton: Cervical dextrocurvature. Reversal of the expected cervical lordosis. 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 No acute bony abnormality or aggressive osseous lesion. Other neck: Redemonstrated enlarged heterogeneous right thyroid lobe. Upper chest: No consolidation within the imaged lung apices. Review of the MIP images confirms the above findings CTA HEAD FINDINGS Anterior circulation: The intracranial internal carotid arteries are patent. Calcified plaque within both vessels with no more than mild stenosis. The M1 middle cerebral arteries are patent. No M2 proximal branch occlusion or high-grade proximal stenosis is identified. The anterior cerebral arteries are patent. No intracranial aneurysm is identified. Posterior circulation: The intracranial vertebral arteries are patent. Calcified plaque within the V4 left vertebral artery with mild stenosis. The basilar artery is patent. The posterior cerebral arteries are patent. A left posterior communicating artery is present. The right posterior communicating artery is hypoplastic or absent Venous sinuses: As permitted by contrast timing, patent. Anatomic variants: As described Review of the MIP images confirms the above findings IMPRESSION: CTA neck: 1. Interval left carotid endarterectomy. The left common and internal carotid arteries are now patent within the neck without stenosis. 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 Right common carotid and internal carotid arteries patent within the neck without hemodynamically significant  stenosis. Unchanged mild to moderate calcified plaque within the carotid bifurcation and proximal ICA. 3. Vertebral arteries patent within the neck. Unchanged mild/moderate atherosclerotic narrowing at the origin of the left vertebral artery 4. Redemonstrated enlarged heterogeneous right thyroid lobe. Nonemergent thyroid ultrasound is recommended for further evaluation. CTA head: 1. No intracranial large vessel occlusion or proximal high-grade arterial stenosis. 2. Calcified plaque within the intracranial ICAs with no more than mild stenosis. 3. Calcified plaque within the V4 left vertebral artery with mild stenosis at this site. Electronically Signed: By: Jackey Loge DO On: 12/24/2020 18:06   CT HEAD CODE STROKE WO CONTRAST  Result Date: 12/24/2020 CLINICAL DATA:  Code stroke.  Aphasia, now nearly resolved. EXAM: CT HEAD WITHOUT CONTRAST TECHNIQUE: Contiguous axial images were obtained from the base of the skull through the vertex without intravenous contrast. COMPARISON:  Prior head CT 09/14/2020, CT angiogram head/neck 09/14/2020. FINDINGS: Brain: Mild cerebral and cerebellar atrophy. Moderate patchy and confluent ill-defined hypoattenuation  within the cerebral white matter is nonspecific, but compatible with chronic small vessel ischemic disease. There is no acute intracranial hemorrhage. No demarcated cortical infarct. No extra-axial fluid collection. No evidence of intracranial mass. No midline shift. Vascular: No hyperdense vessel.  Atherosclerotic calcifications Skull: Normal. Negative for fracture or focal lesion. Sinuses/Orbits: Visualized orbits show no acute finding. No significant paranasal sinus disease at the imaged levels ASPECTS [facility] Stroke Program Early CT Score) - Ganglionic level infarction (caudate, lentiform nuclei, internal capsule, insula, M1-M3 cortex): 7 - Supraganglionic infarction (M4-M6 cortex): 3 Total score (0-10 with 10 being normal): 10 These results were called by telephone  at the time of interpretation on 12/24/2020 at 5:36 pm to provider Dr. Otelia Limes, who verbally acknowledged these results. IMPRESSION: No evidence of acute intracranial hemorrhage or acute demarcated cortical infarction. 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 Electronically Signed   By: Jackey Loge DO   On: 12/24/2020 17:41   CT ANGIO NECK CODE STROKE  Addendum Date: 12/24/2020   ADDENDUM REPORT: 12/24/2020 18:37 ADDENDUM: No large vessel occlusion. These results were called by telephone at the time of interpretation on 12/24/2020 at 5:55 pm to provider ERIC [facility] , who verbally acknowledged these results. Dr. 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 Electronically Signed   By: Jackey Loge DO   On: 12/24/2020 18:37   Result Date: 12/24/2020 CLINICAL DATA:  Code stroke.  Aphasia, now nearly resolved. EXAM: CT ANGIOGRAPHY HEAD AND NECK TECHNIQUE: Multidetector CT imaging of the head and neck was performed using the standard protocol during bolus administration of intravenous contrast. Multiplanar CT image reconstructions and MIPs were obtained to evaluate the vascular anatomy. Carotid stenosis measurements (when applicable) are obtained utilizing NASCET criteria, using the distal internal carotid diameter as the denominator. CONTRAST:  67mL OMNIPAQUE IOHEXOL 350 MG/ML SOLN COMPARISON:  CT angiogram head/neck 09/14/2020. FINDINGS: CTA NECK FINDINGS Aortic arch: Standard aortic branching. Atherosclerotic plaque within the visualized aortic arch and proximal major branch vessels of the neck. No hemodynamically significant innominate or proximal subclavian artery stenosis. Right carotid system: CCA and ICA patent within the neck without significant stenosis (50% or  greater). Mild to moderate calcified plaque within the carotid bifurcation and proximal ICA. Left carotid system: Interval left carotid endarterectomy. CCA and ICA patent within the neck without stenosis. 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 No more than mild vessel narrowing at this site Vertebral arteries: Patent within the neck bilaterally. Left vertebral artery dominant. Redemonstrated mild/moderate stenosis at the origin of the left vertebral artery. Skeleton: Cervical dextrocurvature. Reversal of the expected cervical lordosis. 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 No acute bony abnormality or aggressive osseous lesion. Other neck: Redemonstrated enlarged heterogeneous right thyroid lobe. Upper chest: No consolidation within the imaged lung apices. Review of the MIP images confirms the above findings CTA HEAD FINDINGS Anterior circulation: The intracranial internal carotid arteries are patent. Calcified plaque within both vessels with no more than mild stenosis. The M1 middle cerebral arteries are patent. No M2 proximal branch occlusion or high-grade proximal stenosis is identified. The anterior cerebral arteries are patent. No intracranial aneurysm is identified. Posterior circulation: The intracranial vertebral arteries are patent. Calcified plaque within the V4 left  vertebral artery with mild stenosis. The basilar artery is patent. The posterior cerebral arteries are patent. A left posterior communicating artery is present. The right posterior communicating artery is hypoplastic or absent Venous sinuses: As permitted by contrast timing, patent. Anatomic variants: As described Review of the MIP images confirms the above findings IMPRESSION: CTA neck: 1. Interval left carotid endarterectomy. The left common and internal carotid arteries are now patent within the neck without stenosis. 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 Right common carotid and internal carotid arteries patent within the neck without hemodynamically significant stenosis. Unchanged mild to moderate calcified plaque within the carotid bifurcation and proximal ICA. 3. Vertebral arteries patent within the neck. Unchanged mild/moderate atherosclerotic narrowing at the origin of the left vertebral artery 4. Redemonstrated enlarged heterogeneous right thyroid lobe. Nonemergent thyroid ultrasound is recommended for further evaluation. CTA head: 1. No intracranial large vessel occlusion or proximal high-grade arterial stenosis. 2. Calcified plaque within the intracranial ICAs with no more than mild stenosis. 3. Calcified plaque within the V4 left vertebral artery with mild stenosis at this site. Electronically Signed: By: Jackey Loge DO On: 12/24/2020 18:06    Review of Systems  Constitutional: Negative.   HENT: Negative.   Eyes: Negative.   Respiratory: Negative for chest tightness and shortness of breath.   Cardiovascular: Negative for chest pain.  Gastrointestinal: Negative for abdominal distention, abdominal pain, nausea and vomiting.  Endocrine: Negative.   Genitourinary: Negative.   Musculoskeletal:       See HPI  Neurological:       See HPI  Hematological: Bruises/bleeds easily.  Psychiatric/Behavioral: Negative.    Blood pressure (!) 154/92, pulse 64, temperature (!) 97.4 F (36.3 C), temperature source Oral, resp. rate 16, height 5\' 10"  (1.778 m), weight 76.5 kg, SpO2 100 %. Physical Exam Constitutional:      General: He is not in acute distress. HENT:     Head: Normocephalic.     Nose: Nose normal.     Mouth/Throat:     Mouth: Mucous membranes are moist.  Eyes:     Pupils: Pupils are equal, round, and reactive to light.  Cardiovascular:     Rate and Rhythm: Normal rate and  regular rhythm.  Pulmonary:     Effort: Pulmonary effort is normal. No respiratory distress.     Breath sounds: No wheezing.  Abdominal:     General: Abdomen is flat.     Palpations: Abdomen is soft.     Tenderness: There is no abdominal tenderness.  Musculoskeletal:     Comments: Multiple skin tears lateral left elbow, see photo from ED.  There was some mild bleeding from the more proximal portions of the wound.  No gross bony deformity.  Neurological:     Mental Status: He is alert.     Comments: Alert, speech fluent, moves all extremities  Psychiatric:        Mood and Affect: Mood normal.        Assessment/Plan: Status post fall with multiple skin tears left elbow -oozing due to anticoagulation.  I took the old dressing off of the wound and there was a little bit of oozing on the proximal portion.  I placed a quick clot dressing followed by gauze, Kerlix and an Ace wrap.  These are essentially thin skin tears, not amenable to any sutures.  No evidence of  infection at this point.  X-ray pending. Will reevaluate tomorrow and, if hemostatic, plan applying a nonadherent dressing like Xeroform.  I spoke with his wife as well.  TIA -Per primary team and stroke service.  Liz MaladyBurke E Jorgia Manthei 12/25/2020, 12:48 PM

## 2020-12-25 NOTE — Progress Notes (Addendum)
STROKE TEAM PROGRESS NOTE    Interval History  Pending neuro work up includes MRI Brain- patient reports that his pacemaker is not MRI compatible.   His difficulty with speech has resolved and he feels back to normal. He gives me history that after he fell outside and came home and was attending to his left elbow abrasion use the toilet and felt like he was about to pass out and needed help to get to his bed.  His wife felt that he had some trouble speaking but denied any focal extremity weakness or facial droop. Pertinent Lab Work and Imaging    CT Head WO IV Contrast 12/24/20 Mild cerebral and cerebellar atrophy. Moderate patchy and confluent ill-defined hypoattenuation within the cerebral white matter is nonspecific, but compatible with chronic small vessel ischemic disease. There is no acute intracranial hemorrhage. No demarcated cortical infarct. No extra-axial fluid collection. No evidence of intracranial mass.No midline shift.  CT Angio Head and Neck W WO IV Contrast 12/24/20 CTA NECK FINDINGS  Aortic arch: Standard aortic branching. Atherosclerotic plaque within the visualized aortic arch and proximal major branch vessels of the neck. No hemodynamically significant innominate or proximal subclavian artery stenosis.  Right carotid system: CCA and ICA patent within the neck without significant stenosis (50% or greater). Mild to moderate calcified plaque within the carotid bifurcation and proximal ICA.  Left carotid system: Interval left carotid endarterectomy. CCA and ICA patent within the neck without stenosis. 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 No more than mild vessel narrowing at this site.  Vertebral arteries: Patent within the neck bilaterally. Left vertebral artery dominant. Redemonstrated mild/moderate stenosis at the origin of the left vertebral artery.  Skeleton: Cervical dextrocurvature.  Reversal of the expected cervical lordosis. 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 No acute bony abnormality or aggressive osseous lesion.  Other neck: Redemonstrated enlarged heterogeneous right thyroid lobe.  Upper chest: No consolidation within the imaged lung apices.  Review of the MIP images confirms the above findings  CTA HEAD FINDINGS  Anterior circulation:  The intracranial internal carotid arteries are patent. Calcified plaque within both vessels with no more than mild stenosis. The M1 middle cerebral arteries are patent. No M2 proximal branch occlusion or high-grade proximal stenosis is identified. The anterior cerebral arteries are patent. No intracranial aneurysm is identified.  Posterior circulation:  The intracranial vertebral arteries are patent. Calcified plaque within the V4 left vertebral artery with mild stenosis. The basilar artery is patent. The posterior cerebral arteries are patent. A left posterior communicating artery is present. The right posterior communicating artery is hypoplastic or absent   Physical Examination   Constitutional: Calm, appropriate for condition  Cardiovascular: Normal RR Respiratory: No increased WOB   Mental status: AAOx4 Speech: Fluent with naming and repetition intact Cranial nerves: EOMI, VFF, Face symmetric, Sensation intact to V1V2V3 with light touch, tongue midline, shoulder shrug intact  Motor: Normal bulk and tone. No drift. Full strength throughout.  Sensory: Intact to light tough throughout Coordination: No ataxia FNF  Reflexes: Deferred  Gait: Deferred  NIHSS: 0   Assessment and Plan   [name] is a 80 y.o. 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   #Difficulty speaking  Patient presents with the symptoms described above. A discussion was held at length with the  patient and his wife regarding  the event that transpired yesterday. Per the patient during the day yesterday he was walking into a store when he had a fall; he fell onto his left side, causing an abrasion to his left arm. He went home and went to the bathroom. His wife was helping him in the bathroom; while sitting on the toilet the patient told her that he felt light headed and dizzy. He asked to go lay down. She also noted that he was having trouble speaking to her at this time. The patient states that he has had prior TIAs with speech difficulty and this was similar to these episodes but not as bad. At this time, his symptoms have completely resolved and he feels normal.   His CTH was negative for any acute findings, did show likely chronic small vessel disease. CTA Head and Neck pertinent for findings consistent with prior left carotid endarterectomy and the left common and internal carotid arteries are now patent within the neck without significant stenosis. MRI Brain unable to be ordered given his pacemaker is not MRI compatible. Stroke labs LDL 34, A1C 5.6. The patients symptoms are more compelling for a syncopal event instead of a TIA. Regardless, he has stroke risk factors including his atrial flutter. For stroke prevention he is to continue to take his Eliquis 5 mg, Aspirin 81 mg(ASA per vascular) and Atorvastatin 40 mg.   #Hyperlipidemia From a stroke prevention stand point, the LDL goal is < 70. He is at goal w/LDL 34, continue to take Atorvastatin 40 mg.   #Prediabetes Hemoglobin A1C this admission noted to be 5.6, follow up with PCP for prediabetes management.   Neurology will sign off, please refer to Amion to page with any questions.   Hospital day # 0  Stark Jock, NP  [facility] Nurse Practitioner Patient seen and discussed with attending physician Dr. Pearlean Brownie   Stroke MD note :  I have personally obtained history,examined this patient, reviewed notes, independently  viewed imaging studies, participated in medical decision making and plan of care.ROS completed by me personally and pertinent positives fully documented  I have made any additions or clarifications directly to the above note. Agree with note above.. Patient's history is more suggestive of near syncope following his fall and injury with some word finding difficulties in the setting of feeling dizzy lightheaded nearly passing out.  Recommend check MRI but since he has pacemaker will have to wait till Monday.  Continue aggressive risk factor modification Eliquis for stroke prevention for his A. fib.  Long discussion with patient and his wife and answered questions.  Discussed with Dr. Lynden Oxford.  Greater than 50% time during this 35-minute visit spent for counseling and coordination of care about possible TIA versus syncope and discussion with care team  Delia Heady, MD Medical Director [facility] Pager: [phone] 12/25/2020 4:08 PM  To contact Stroke Continuity provider, please refer to [email or web address]. After hours, contact General Neurology

## 2020-12-25 NOTE — ED Notes (Signed)
Pt resting in room, denies pain at this time Dressing on left arms saturated with blood, wound cleaned and rewrapped.   respirations are even and non-labored, skin is warm and dry Pt does not appear in distress Call light within reach and visitor remains at bedside.

## 2020-12-25 NOTE — Progress Notes (Signed)
[facility] Progress Note  Patient: [name]    XVQ:[id number]  DOA: 12/24/2020     Date of Service: the patient was seen and examined on 12/25/2020  Brief hospital course: Past medical history of TIA, carotid endarterectomy in November 21, A. fib, PPM implant, on Eliquis.  Presents with complaints of difficulty speaking after a fall. Currently plan is monitor for bleeding from the laceration site.  Assessment and Plan: 1.  TIA/CVA ruled out. Syncope Patient presented with a fall while climbing the curb.  Unsure whether he passed out or not but he does not remember whether he hit his head or not.  He injured his left elbow with some bleeding After that he drove back home and while dressing his wound started feeling dizzy and lightheaded and having difficulty speaking. EMS was called and patient was brought to the hospital. CTA head and neck shows evidence of findings consistent with left carotid endarterectomy with a potential flap which might be chronic. MRI brain unable to perform secondary to pacemaker. Currently symptoms are improving.  Patient appears to be back to baseline. PT recommends home health, OT recommends no follow-up. On aspirin and Eliquis which we will continue. On Lipitor 40 mg, LDL 34.  2.  Continuous bleeding from laceration on left elbow  Patient sustained injury to his left elbow after a fall. Has ongoing bleeding from the site. Dry dressing did not stop bleeding. Hemostatic dressing did not stop bleeding with pressure. Trauma surgery was consulted currently quick clot dressing applied with Ace wrap. Need to monitor this patient as he is on Eliquis and aspirin and remains at risk for ongoing bleeding.  3.  Paroxysmal A. fib Pacemaker implant On Eliquis and aspirin.  Also on rate control medication.  Continue to monitor.  4.  Chronic kidney disease stage II Renal function appears to be elevated chronically. We will continue to monitor.  Diet:  Cardiac diet DVT Prophylaxis:    apixaban (ELIQUIS) tablet 5 mg    Advance goals of care discussion: Full code  Family Communication: family was present at bedside, at the time of interview.  The pt provided permission to discuss medical plan with the family. Opportunity was given to ask question and all questions were answered satisfactorily.   Subjective: No nausea no vomiting.  Speech back to normal.  No fever no chills.  Physical Exam:  General: Appear in mild distress, no Rash; Oral Mucosa Clear, moist. no Abnormal Neck Mass Or lumps, Conjunctiva normal  Cardiovascular: S1 and S2 Present, no Murmur, Respiratory: good respiratory effort, Bilateral Air entry present and CTA, no Crackles, no wheezes Abdomen: Bowel Sound present, Soft and no tenderness Extremities: no Pedal edema Neurology: alert and oriented to time, place, and person affect appropriate. no new focal deficit Gait not checked due to patient safety concerns  Vitals:   12/25/20 0615 12/25/20 0630 12/25/20 1000 12/25/20 1128  BP: 113/62 (!) 150/60 138/62 (!) 154/92  Pulse: (!) 59 (!) 59 (!) 59 64  Resp: 15 12 (!) 9 16  Temp:   (!) 97.4 F (36.3 C) (!) 97.4 F (36.3 C)  TempSrc:   Oral Oral  SpO2: 97% 96% 98% 100%  Weight:      Height:       No intake or output data in the 24 hours ending 12/25/20 1743 Filed Weights   12/24/20 1700 12/24/20 1721  Weight: 76.5 kg 76.5 kg    Data Reviewed: I have personally reviewed and interpreted daily labs, tele  strips, imaging. I reviewed all nursing notes, pharmacy notes, vitals, pertinent old records I have discussed plan of care as described above with RN and patient/family.  CBC: Recent Labs  Lab 12/24/20 1712 12/24/20 1724  WBC 4.9  --   NEUTROABS 2.3  --   HGB 12.7* 12.6*  HCT 38.2* 37.0*  MCV 96.5  --   PLT 160  --    Basic Metabolic Panel: Recent Labs  Lab 12/24/20 1712 12/24/20 1724  NA 138 138  K 4.8 4.7  CL 105 104  CO2 21*  --   GLUCOSE  103* 104*  BUN 25* 27*  CREATININE 1.28* 1.30*  CALCIUM 9.2  --     Studies: CT ANGIO HEAD W OR WO CONTRAST  Addendum Date: 12/24/2020   ADDENDUM REPORT: 12/24/2020 18:37 ADDENDUM: No large vessel occlusion. These results were called by telephone at the time of interpretation on 12/24/2020 at 5:55 pm to provider [facility] , who verbally acknowledged these results. Dr. 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 Electronically Signed   By: Jackey LogeKyle  Golden DO   On: 12/24/2020 18:37   Result Date: 12/24/2020 CLINICAL DATA:  Code stroke.  Aphasia, now nearly resolved. EXAM: CT ANGIOGRAPHY HEAD AND NECK TECHNIQUE: Multidetector CT imaging of the head and neck was performed using the standard protocol during bolus administration of intravenous contrast. Multiplanar CT image reconstructions and MIPs were obtained to evaluate the vascular anatomy. Carotid stenosis measurements (when applicable) are obtained utilizing NASCET criteria, using the distal internal carotid diameter as the denominator. CONTRAST:  75mL OMNIPAQUE IOHEXOL 350 MG/ML SOLN COMPARISON:  CT angiogram head/neck 09/14/2020. FINDINGS: CTA NECK FINDINGS Aortic arch: Standard aortic branching. Atherosclerotic plaque within the visualized aortic arch and proximal major branch vessels of the neck. No hemodynamically significant innominate or proximal subclavian artery stenosis. Right carotid system: CCA and ICA patent within the neck without significant stenosis (50% or greater). Mild to moderate calcified plaque within the carotid bifurcation and proximal ICA. Left carotid system: Interval left carotid endarterectomy. CCA and ICA patent within the neck without stenosis. 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 No more than mild vessel narrowing at this  site Vertebral arteries: Patent within the neck bilaterally. Left vertebral artery dominant. Redemonstrated mild/moderate stenosis at the origin of the left vertebral artery. Skeleton: Cervical dextrocurvature. Reversal of the expected cervical lordosis. 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 No acute bony abnormality or aggressive osseous lesion. Other neck: Redemonstrated enlarged heterogeneous right thyroid lobe. Upper chest: No consolidation within the imaged lung apices. Review of the MIP images confirms the above findings CTA HEAD FINDINGS Anterior circulation: The intracranial internal carotid arteries are patent. Calcified plaque within both vessels with no more than mild stenosis. The M1 middle cerebral arteries are patent. No M2 proximal branch occlusion or high-grade proximal stenosis is identified. The anterior cerebral arteries are patent. No intracranial aneurysm is identified. Posterior circulation: The intracranial vertebral arteries are patent. Calcified plaque within the V4 left vertebral artery with mild stenosis. The basilar artery is patent. The posterior cerebral arteries are patent. A left posterior communicating artery is present. The right posterior communicating artery is hypoplastic or absent Venous sinuses: As permitted by contrast timing, patent. Anatomic variants: As described Review of the MIP images confirms the above findings IMPRESSION: CTA neck: 1.  Interval left carotid endarterectomy. The left common and internal carotid arteries are now patent within the neck without stenosis. 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 Right common carotid and internal carotid arteries patent within the neck without hemodynamically significant stenosis. Unchanged mild to moderate calcified plaque within the carotid bifurcation and proximal ICA. 3.  Vertebral arteries patent within the neck. Unchanged mild/moderate atherosclerotic narrowing at the origin of the left vertebral artery 4. Redemonstrated enlarged heterogeneous right thyroid lobe. Nonemergent thyroid ultrasound is recommended for further evaluation. CTA head: 1. No intracranial large vessel occlusion or proximal high-grade arterial stenosis. 2. Calcified plaque within the intracranial ICAs with no more than mild stenosis. 3. Calcified plaque within the V4 left vertebral artery with mild stenosis at this site. Electronically Signed: By: Jackey Loge DO On: 12/24/2020 18:06   DG Elbow 2 Views Left  Result Date: 12/25/2020 CLINICAL DATA:  Pain following fall EXAM: LEFT ELBOW - 4 VIEW COMPARISON:  None. FINDINGS: Frontal, lateral, and bilateral oblique views were obtained. There is no fracture or dislocation. No joint effusion. There are scattered foci of intra-articular calcification. No appreciable joint space narrowing. IMPRESSION: No fracture or dislocation. No appreciable joint space narrowing. Foci of intra-articular calcification raise question of underlying synovial chondromatosis. Electronically Signed   By: Bretta Bang III M.D.   On: 12/25/2020 13:14   ECHOCARDIOGRAM COMPLETE  Result Date: 12/25/2020    ECHOCARDIOGRAM REPORT   Patient Name:   [name] Date of Exam: 12/25/2020 Medical Rec #:  [id number]   Height:       70.0 in Accession #:    [id number]  Weight:       168.7 lb Date of Birth:  07-Aug-1941   BSA:          1.941 m Patient Age:    79 years    BP:           138/62 mmHg Patient Gender: M           HR:           61 bpm. Exam Location:  Inpatient Procedure: 2D Echo, Color Doppler and Cardiac Doppler Indications:    TIA  History:        Patient has prior history of Echocardiogram examinations, most                 recent 09/12/2020. Pacemaker, Arrythmias:Atrial Fibrillation;                 Risk Factors:Hypertension.  Sonographer:    Irving Burton Senior RDCS Referring Phys: [phone]  Meryle Ready [facility]  Sonographer Comments: Technically difficult due to lung/rib interference. IMPRESSIONS  1. Left ventricular ejection fraction, by estimation, is 60 to 65%. The left ventricle has normal function. The left ventricle has no regional wall motion abnormalities. There is mild concentric left ventricular hypertrophy. Left ventricular diastolic parameters are consistent with Grade I diastolic dysfunction (impaired relaxation).  2. Right ventricular systolic function is normal. The right ventricular size is normal. There is normal pulmonary artery systolic pressure.  3. The mitral valve is grossly normal. Trivial mitral valve regurgitation.  4. The aortic valve is tricuspid. Aortic valve regurgitation is trivial.  5. Aortic dilatation noted. There is mild dilatation of the aortic root, measuring 39 mm. There is mild dilatation of the ascending aorta, measuring 37 mm.  6. The inferior vena cava  is normal in size with greater than 50% respiratory variability, suggesting right atrial pressure of 3 mmHg. Comparison(s): No significant change from prior study. Conclusion(s)/Recommendation(s): No intracardiac source of embolism detected on this transthoracic study. A transesophageal echocardiogram is recommended to exclude cardiac source of embolism if clinically indicated. FINDINGS  Left Ventricle: Left ventricular ejection fraction, by estimation, is 60 to 65%. The left ventricle has normal function. The left ventricle has no regional wall motion abnormalities. The left ventricular internal cavity size was normal in size. There is  mild concentric left ventricular hypertrophy. Left ventricular diastolic parameters are consistent with Grade I diastolic dysfunction (impaired relaxation). Right Ventricle: The right ventricular size is normal. No increase in right ventricular wall thickness. Right ventricular systolic function is normal. There is normal pulmonary artery systolic pressure. The tricuspid  regurgitant velocity is 2.05 m/s, and  with an assumed right atrial pressure of 3 mmHg, the estimated right ventricular systolic pressure is 19.8 mmHg. Left Atrium: Left atrial size was normal in size. Right Atrium: Right atrial size was normal in size. Pericardium: There is no evidence of pericardial effusion. Mitral Valve: The mitral valve is grossly normal. There is mild thickening of the mitral valve leaflet(s). Mild mitral annular calcification. Trivial mitral valve regurgitation. Tricuspid Valve: The tricuspid valve is normal in structure. Tricuspid valve regurgitation is mild. Aortic Valve: The aortic valve is tricuspid. Aortic valve regurgitation is trivial. Pulmonic Valve: The pulmonic valve was not well visualized. Pulmonic valve regurgitation is not visualized. Aorta: Aortic dilatation noted. There is mild dilatation of the aortic root, measuring 39 mm. There is mild dilatation of the ascending aorta, measuring 37 mm. Venous: The inferior vena cava is normal in size with greater than 50% respiratory variability, suggesting right atrial pressure of 3 mmHg. IAS/Shunts: No atrial level shunt detected by color flow Doppler. Additional Comments: A pacer wire is visualized.  LEFT VENTRICLE PLAX 2D LVIDd:         4.50 cm  Diastology LVIDs:         2.60 cm  LV e' medial:    8.16 cm/s LV PW:         1.20 cm  LV E/e' medial:  6.3 LV IVS:        1.00 cm  LV e' lateral:   7.62 cm/s LVOT diam:     2.00 cm  LV E/e' lateral: 6.7 LV SV:         53 LV SV Index:   27 LVOT Area:     3.14 cm  RIGHT VENTRICLE RV S prime:     8.49 cm/s TAPSE (M-mode): 2.2 cm LEFT ATRIUM             Index       RIGHT ATRIUM           Index LA diam:        3.80 cm 1.96 cm/m  RA Area:     19.20 cm LA Vol (A2C):   59.4 ml 30.60 ml/m RA Volume:   54.60 ml  28.12 ml/m LA Vol (A4C):   63.5 ml 32.71 ml/m LA Biplane Vol: 67.5 ml 34.77 ml/m  AORTIC VALVE LVOT Vmax:         75.70 cm/s LVOT Vmean:        51.100 cm/s LVOT VTI:          0.168 m AR  Vena Contracta: 0.40 cm  AORTA Ao Root diam: 3.60 cm Ao Asc diam:  3.70 cm MITRAL  VALVE               TRICUSPID VALVE MV Area (PHT): 2.36 cm    TR Peak grad:   16.8 mmHg MV Decel Time: 322 msec    TR Vmax:        205.00 cm/s MV E velocity: 51.40 cm/s MV A velocity: 61.70 cm/s  SHUNTS MV E/A ratio:  0.83        Systemic VTI:  0.17 m                            Systemic Diam: 2.00 cm Laurance Flatten MD Electronically signed by Laurance Flatten MD Signature Date/Time: 12/25/2020/1:56:40 PM    Final    CT ANGIO NECK CODE STROKE  Addendum Date: 12/24/2020   ADDENDUM REPORT: 12/24/2020 18:37 ADDENDUM: No large vessel occlusion. These results were called by telephone at the time of interpretation on 12/24/2020 at 5:55 pm to provider ERIC [facility] , who verbally acknowledged these results. Dr. 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 Electronically Signed   By: Jackey Loge DO   On: 12/24/2020 18:37   Result Date: 12/24/2020 CLINICAL DATA:  Code stroke.  Aphasia, now nearly resolved. EXAM: CT ANGIOGRAPHY HEAD AND NECK TECHNIQUE: Multidetector CT imaging of the head and neck was performed using the standard protocol during bolus administration of intravenous contrast. Multiplanar CT image reconstructions and MIPs were obtained to evaluate the vascular anatomy. Carotid stenosis measurements (when applicable) are obtained utilizing NASCET criteria, using the distal internal carotid diameter as the denominator. CONTRAST:  37mL OMNIPAQUE IOHEXOL 350 MG/ML SOLN COMPARISON:  CT angiogram head/neck 09/14/2020. FINDINGS: CTA NECK FINDINGS Aortic arch: Standard aortic branching. Atherosclerotic plaque within the visualized aortic arch and proximal major branch vessels of the neck. No hemodynamically significant innominate or proximal subclavian artery stenosis. Right carotid system: CCA and ICA patent  within the neck without significant stenosis (50% or greater). Mild to moderate calcified plaque within the carotid bifurcation and proximal ICA. Left carotid system: Interval left carotid endarterectomy. CCA and ICA patent within the neck without stenosis. 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 No more than mild vessel narrowing at this site Vertebral arteries: Patent within the neck bilaterally. Left vertebral artery dominant. Redemonstrated mild/moderate stenosis at the origin of the left vertebral artery. Skeleton: Cervical dextrocurvature. Reversal of the expected cervical lordosis. 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 No acute bony abnormality or aggressive osseous lesion. Other neck: Redemonstrated enlarged heterogeneous right thyroid lobe. Upper chest: No consolidation within the imaged lung apices. Review of the MIP images confirms the above findings CTA HEAD FINDINGS Anterior circulation: The intracranial internal carotid arteries are patent. Calcified plaque within both vessels with no more than mild stenosis. The M1 middle cerebral arteries are patent. No M2 proximal branch occlusion or high-grade proximal stenosis is identified. The anterior cerebral arteries are patent. No intracranial aneurysm is identified. Posterior circulation: The intracranial vertebral arteries are patent. Calcified plaque within the V4 left vertebral artery with mild stenosis. The basilar artery is patent. The posterior cerebral arteries are patent. A left posterior communicating artery is present. The right posterior communicating artery is hypoplastic or absent Venous sinuses: As permitted by contrast timing, patent. Anatomic variants: As described Review of the MIP images confirms  the above findings IMPRESSION: CTA neck: 1. Interval left carotid endarterectomy. The left common and internal carotid arteries are now patent within  the neck without stenosis. 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 Right common carotid and internal carotid arteries patent within the neck without hemodynamically significant stenosis. Unchanged mild to moderate calcified plaque within the carotid bifurcation and proximal ICA. 3. Vertebral arteries patent within the neck. Unchanged mild/moderate atherosclerotic narrowing at the origin of the left vertebral artery 4. Redemonstrated enlarged heterogeneous right thyroid lobe. Nonemergent thyroid ultrasound is recommended for further evaluation. CTA head: 1. No intracranial large vessel occlusion or proximal high-grade arterial stenosis. 2. Calcified plaque within the intracranial ICAs with no more than mild stenosis. 3. Calcified plaque within the V4 left vertebral artery with mild stenosis at this site. Electronically Signed: By: Jackey Loge DO On: 12/24/2020 18:06    Scheduled Meds: . apixaban  5 mg Oral BID  . aspirin EC  81 mg Oral Daily  . atorvastatin  40 mg Oral Daily  . budesonide  0.25 mg Inhalation BID  . magnesium oxide  200 mg Oral q morning  . multivitamin with minerals  1 tablet Oral q morning  . omega-3 acid ethyl esters  1 g Oral BID  . pantoprazole  40 mg Oral Daily  . sodium chloride flush  3 mL Intravenous Once   Continuous Infusions: PRN Meds: acetaminophen **OR** acetaminophen (TYLENOL) oral liquid 160 mg/5 mL **OR** acetaminophen, albuterol  Time spent: 35 minutes  Author: Lynden Oxford, MD [facility] 12/25/2020 5:43 PM  To reach On-call, see care teams to locate the attending and reach out via [email or web address]. Between 7PM-7AM, please contact night-coverage If you still have difficulty reaching the attending provider, please page the [facility] (Director on Call) for [facility] on amion for assistance.

## 2020-12-26 DIAGNOSIS — Z950 Presence of cardiac pacemaker: Secondary | ICD-10-CM | POA: Diagnosis not present

## 2020-12-26 DIAGNOSIS — N182 Chronic kidney disease, stage 2 (mild): Secondary | ICD-10-CM | POA: Diagnosis present

## 2020-12-26 DIAGNOSIS — K219 Gastro-esophageal reflux disease without esophagitis: Secondary | ICD-10-CM | POA: Diagnosis present

## 2020-12-26 DIAGNOSIS — I10 Essential (primary) hypertension: Secondary | ICD-10-CM | POA: Diagnosis not present

## 2020-12-26 DIAGNOSIS — R4701 Aphasia: Secondary | ICD-10-CM | POA: Diagnosis present

## 2020-12-26 DIAGNOSIS — S40212A Abrasion of left shoulder, initial encounter: Secondary | ICD-10-CM | POA: Diagnosis present

## 2020-12-26 DIAGNOSIS — E785 Hyperlipidemia, unspecified: Secondary | ICD-10-CM | POA: Diagnosis present

## 2020-12-26 DIAGNOSIS — Z7951 Long term (current) use of inhaled steroids: Secondary | ICD-10-CM | POA: Diagnosis not present

## 2020-12-26 DIAGNOSIS — Z7982 Long term (current) use of aspirin: Secondary | ICD-10-CM | POA: Diagnosis not present

## 2020-12-26 DIAGNOSIS — Z7901 Long term (current) use of anticoagulants: Secondary | ICD-10-CM | POA: Diagnosis not present

## 2020-12-26 DIAGNOSIS — I480 Paroxysmal atrial fibrillation: Secondary | ICD-10-CM | POA: Diagnosis present

## 2020-12-26 DIAGNOSIS — M47812 Spondylosis without myelopathy or radiculopathy, cervical region: Secondary | ICD-10-CM | POA: Diagnosis present

## 2020-12-26 DIAGNOSIS — G459 Transient cerebral ischemic attack, unspecified: Secondary | ICD-10-CM | POA: Diagnosis present

## 2020-12-26 DIAGNOSIS — I4892 Unspecified atrial flutter: Secondary | ICD-10-CM | POA: Diagnosis present

## 2020-12-26 DIAGNOSIS — R7303 Prediabetes: Secondary | ICD-10-CM | POA: Diagnosis present

## 2020-12-26 DIAGNOSIS — Z87891 Personal history of nicotine dependence: Secondary | ICD-10-CM | POA: Diagnosis not present

## 2020-12-26 DIAGNOSIS — S51012A Laceration without foreign body of left elbow, initial encounter: Secondary | ICD-10-CM | POA: Diagnosis present

## 2020-12-26 DIAGNOSIS — Z8673 Personal history of transient ischemic attack (TIA), and cerebral infarction without residual deficits: Secondary | ICD-10-CM | POA: Diagnosis not present

## 2020-12-26 DIAGNOSIS — I129 Hypertensive chronic kidney disease with stage 1 through stage 4 chronic kidney disease, or unspecified chronic kidney disease: Secondary | ICD-10-CM | POA: Diagnosis present

## 2020-12-26 DIAGNOSIS — R55 Syncope and collapse: Secondary | ICD-10-CM | POA: Diagnosis present

## 2020-12-26 DIAGNOSIS — W010XXA Fall on same level from slipping, tripping and stumbling without subsequent striking against object, initial encounter: Secondary | ICD-10-CM | POA: Diagnosis present

## 2020-12-26 DIAGNOSIS — Z79899 Other long term (current) drug therapy: Secondary | ICD-10-CM | POA: Diagnosis not present

## 2020-12-26 DIAGNOSIS — D631 Anemia in chronic kidney disease: Secondary | ICD-10-CM | POA: Diagnosis present

## 2020-12-26 DIAGNOSIS — Z20822 Contact with and (suspected) exposure to covid-19: Secondary | ICD-10-CM | POA: Diagnosis present

## 2020-12-26 LAB — CBC
HCT: 36.2 % — ABNORMAL LOW (ref 39.0–52.0)
Hemoglobin: 12.5 g/dL — ABNORMAL LOW (ref 13.0–17.0)
MCH: 32.5 pg (ref 26.0–34.0)
MCHC: 34.5 g/dL (ref 30.0–36.0)
MCV: 94 fL (ref 80.0–100.0)
Platelets: 160 10*3/uL (ref 150–400)
RBC: 3.85 MIL/uL — ABNORMAL LOW (ref 4.22–5.81)
RDW: 13.4 % (ref 11.5–15.5)
WBC: 5.1 10*3/uL (ref 4.0–10.5)
nRBC: 0 % (ref 0.0–0.2)

## 2020-12-26 LAB — BASIC METABOLIC PANEL
Anion gap: 10 (ref 5–15)
BUN: 17 mg/dL (ref 8–23)
CO2: 23 mmol/L (ref 22–32)
Calcium: 9.1 mg/dL (ref 8.9–10.3)
Chloride: 104 mmol/L (ref 98–111)
Creatinine, Ser: 1.02 mg/dL (ref 0.61–1.24)
GFR, Estimated: >60 mL/min (ref >60)
Glucose, Bld: 110 mg/dL — ABNORMAL HIGH (ref 70–99)
Potassium: 4.3 mmol/L (ref 3.5–5.1)
Sodium: 137 mmol/L (ref 135–145)

## 2020-12-26 MED ORDER — WHITE PETROLATUM EX OINT
TOPICAL_OINTMENT | CUTANEOUS | Status: AC
  Filled 2020-12-26: qty 28.35

## 2020-12-26 MED ORDER — "THROMBI-PAD 3""X3"" EX PADS"
1.0000 | MEDICATED_PAD | Freq: Once | CUTANEOUS | Status: DC
  Filled 2020-12-26: qty 1

## 2020-12-26 NOTE — Progress Notes (Addendum)
    CC: Bleeding left elbow skin tear  Subjective: I took the dressing down and of course it bled some with removal of the clotting agent.  I covered it with Xeroform then some 4 x 4's, Kerlix, and replace the Ace dressing.  Objective: Vital signs in last 24 hours: Temp:  [97.4 F (36.3 C)-98 F (36.7 C)] 98 F (36.7 C) (02/13 0823) Pulse Rate:  [59-66] 66 (02/13 0823) Resp:  [9-16] 15 (02/13 0823) BP: (115-154)/(54-92) 135/62 (02/13 0823) SpO2:  [95 %-100 %] 98 % (02/13 0834)  600 urine recorded BM x1 recorded No intake recorded Afebrile vital signs are stable No labs this a.m.  Intake/Output from previous day: 02/12 0701 - 02/13 0700 In: -  Out: 600 [Urine:600] Intake/Output this shift: No intake/output data recorded.  General appearance: alert, cooperative and no distress Skin: Skin is areas been essentially avulsed.  There is significant ecchymosis all around the skin tears.  He did bleed some when I removed the clotting agent.  We are going to try and control this with Xeroform and ABDs over that.  Lab Results:  Recent Labs    12/24/20 1712 12/24/20 1724  WBC 4.9  --   HGB 12.7* 12.6*  HCT 38.2* 37.0*  PLT 160  --     BMET Recent Labs    12/24/20 1712 12/24/20 1724  NA 138 138  K 4.8 4.7  CL 105 104  CO2 21*  --   GLUCOSE 103* 104*  BUN 25* 27*  CREATININE 1.28* 1.30*  CALCIUM 9.2  --    PT/INR Recent Labs    12/24/20 1712  LABPROT 14.6  INR 1.2    Recent Labs  Lab 12/24/20 1712  AST 29  ALT 34  ALKPHOS 103  BILITOT 1.0  PROT 6.4*  ALBUMIN 3.8     Lipase  No results found for: LIPASE   Medications: . apixaban  5 mg Oral BID  . aspirin EC  81 mg Oral Daily  . atorvastatin  40 mg Oral Daily  . budesonide  0.25 mg Inhalation BID  . magnesium oxide  200 mg Oral q morning  . multivitamin with minerals  1 tablet Oral q morning  . omega-3 acid ethyl esters  1 g Oral BID  . pantoprazole  40 mg Oral Daily  . sodium chloride flush   3 mL Intravenous Once    Anti-infectives (From admission, onward)   None      Assessment/Plan Hx TIA/carotid endarterectomy November 2021 Atrial fibrillation -on Eliquis Permanent transvenous pacemaker Syncope/TIA-CVA ruled out CKD  Left elbow skin tears after fall.  FEN: Heart healthy diet ID: None: DVT apixaban/aspirin  Plan: I took down the dressing and as noted above it did bleed some.  We will try and control with Xeroform, 4 x 4's Kerlix and Ace.  If that fails we will have to try and with the clotting agent again.  Dr. Allena Katz service we cannot discontinue the apixaban secondary to his stroke history.  I will recheck him a little bit later this afternoon.    LOS: 0 days    [name] 12/26/2020 Please see Amion

## 2020-12-26 NOTE — TOC CAGE-AID Note (Signed)
Transition of Care [facility]) - CAGE-AID Screening   Patient Details  Name: [name] MRN: [id number] Date of Birth: 29-Apr-1941   Clinical Narrative: Pt admits to occasional alcohol use, but not in excess. Resources offered, but declined.    CAGE-AID Screening:    Have You Ever Felt You Ought to Cut Down on Your Drinking or Drug Use?: No Have People Annoyed You By Critizing Your Drinking Or Drug Use?: No Have You Felt Bad Or Guilty About Your Drinking Or Drug Use?: No Have You Ever Had a Drink or Used Drugs First Thing In The Morning to Steady Your Nerves or to Get Rid of a Hangover?: No CAGE-AID Score: 0  Substance Abuse Education Offered: No (does not drink in excess)

## 2020-12-26 NOTE — Progress Notes (Signed)
[facility] Progress Note  Patient: [name]    WGN:[id number]  DOA: 12/24/2020     Date of Service: the patient was seen and examined on 12/26/2020  Brief hospital course: Past medical history of TIA, carotid endarterectomy in November 21, A. fib, PPM implant, on Eliquis.  Presents with complaints of difficulty speaking after a fall. Currently plan is monitor for bleeding from the laceration site.  Assessment and Plan: 1.  TIA/CVA ruled out. Syncope Patient presented with a fall while climbing the curb.  Unsure whether he passed out or not but he does not remember whether he hit his head or not.  He injured his left elbow with some bleeding After that he drove back home and while dressing his wound started feeling dizzy and lightheaded and having difficulty speaking. EMS was called and patient was brought to the hospital. CTA head and neck shows evidence of findings consistent with left carotid endarterectomy with a potential flap which might be chronic. MRI brain unable to perform secondary to pacemaker. Currently symptoms are improving.  Patient appears to be back to baseline. PT recommends home health, OT recommends no follow-up. On aspirin and Eliquis.  Eliquis will be on hold until his bleeding resolves. On Lipitor 40 mg, LDL 34.  2.  Continuous bleeding from laceration on left elbow Patient sustained injury to his left elbow after a fall. Has ongoing bleeding from the site. Dry dressing did not stop bleeding. Hemostatic dressing did not stop bleeding with pressure. Trauma surgery was consulted currently quick clot dressing applied with Ace wrap. Due to ongoing bleeding from the site despite multiple attempts and inability to stitch the site together currently we will be holding Eliquis. Discussed with patient in detail regarding his risk for increased stroke when he is off of Eliquis especially in the setting of A. fib and recent presentation with near syncope. Discussed  with vascular surgery as well as neurology regarding the risk.  From vascular surgery point of view patient must continue on aspirin. Patient and family verbalized understanding of the risk for increased stroke and currently agreeable to stop the Eliquis until recommended by trauma surgery to resume.  3.  Paroxysmal A. fib Pacemaker implant On Eliquis and aspirin.  Also on rate control medication.  Continue to monitor.  4.  Chronic kidney disease stage II Renal function appears to be elevated chronically. We will continue to monitor.  Diet: Cardiac diet DVT Prophylaxis:   SCD     Advance goals of care discussion: Full code  Family Communication: family was present at bedside, at the time of interview.  The pt provided permission to discuss medical plan with the family. Opportunity was given to ask question and all questions were answered satisfactorily.   Subjective: No nausea no vomiting.  Continues to have bleeding.  No fever no chills.  No shortness of breath.  Physical Exam:  General: Appear in mild distress, no Rash; Oral Mucosa Clear, moist. no Abnormal Neck Mass Or lumps, Conjunctiva normal  Cardiovascular: S1 and S2 Present, no Murmur, Respiratory: good respiratory effort, Bilateral Air entry present and CTA, no Crackles, no wheezes Abdomen: Bowel Sound present, Soft and no tenderness Extremities: no Pedal edema Neurology: alert and oriented to time, place, and person affect appropriate. no new focal deficit Gait not checked due to patient safety concerns    Vitals:   12/26/20 0823 12/26/20 0834 12/26/20 1205 12/26/20 1707  BP: 135/62  125/66 131/64  Pulse: 66  61 63  Resp:  15  16 16   Temp: 98 F (36.7 C)  98 F (36.7 C) 97.7 F (36.5 C)  TempSrc: Oral  Oral Oral  SpO2: 97% 98% 95% 96%  Weight:      Height:        Intake/Output Summary (Last 24 hours) at 12/26/2020 1902 Last data filed at 12/26/2020 0630 Gross per 24 hour  Intake -  Output 600 ml  Net -600  ml   Filed Weights   12/24/20 1700 12/24/20 1721  Weight: 76.5 kg 76.5 kg    Data Reviewed: I have personally reviewed and interpreted daily labs, tele strips, imaging. I reviewed all nursing notes, pharmacy notes, vitals, pertinent old records I have discussed plan of care as described above with RN and patient/family.  CBC: Recent Labs  Lab 12/24/20 1712 12/24/20 1724 12/26/20 0945  WBC 4.9  --  5.1  NEUTROABS 2.3  --   --   HGB 12.7* 12.6* 12.5*  HCT 38.2* 37.0* 36.2*  MCV 96.5  --  94.0  PLT 160  --  160   Basic Metabolic Panel: Recent Labs  Lab 12/24/20 1712 12/24/20 1724 12/26/20 0945  NA 138 138 137  K 4.8 4.7 4.3  CL 105 104 104  CO2 21*  --  23  GLUCOSE 103* 104* 110*  BUN 25* 27* 17  CREATININE 1.28* 1.30* 1.02  CALCIUM 9.2  --  9.1    Studies: No results found.  Scheduled Meds: . aspirin EC  81 mg Oral Daily  . atorvastatin  40 mg Oral Daily  . budesonide  0.25 mg Inhalation BID  . magnesium oxide  200 mg Oral q morning  . multivitamin with minerals  1 tablet Oral q morning  . omega-3 acid ethyl esters  1 g Oral BID  . pantoprazole  40 mg Oral Daily  . sodium chloride flush  3 mL Intravenous Once  . Thrombi-Pad  1 each Topical Once  . Thrombi-Pad  1 each Topical Once   Continuous Infusions: PRN Meds: acetaminophen **OR** acetaminophen (TYLENOL) oral liquid 160 mg/5 mL **OR** acetaminophen, albuterol  Time spent: 35 minutes  Author: 12/28/20, MD [facility] 12/26/2020 7:02 PM  To reach On-call, see care teams to locate the attending and reach out via [email or web address]. Between 7PM-7AM, please contact night-coverage If you still have difficulty reaching the attending provider, please page the [facility] (Director on Call) for [facility] on amion for assistance.

## 2020-12-26 NOTE — Evaluation (Signed)
Speech Language Pathology Evaluation Patient Details Name: [name] MRN: [id number] DOB: 01-20-1941 Today's Date: 12/26/2020 Time: 8786-7672 SLP Time Calculation (min) (ACUTE ONLY): 17 min  Problem List:  Patient Active Problem List   Diagnosis Date Noted  . PAF (paroxysmal atrial fibrillation) (HCC) 12/24/2020  . 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 Essential hypertension 09/12/2020  . Anemia 09/12/2020  . Aphasia 09/12/2020  . TIA (transient ischemic attack) 09/12/2020   Past Medical History:  Past Medical History:  Diagnosis Date  . Carotid artery occlusion   . GERD (gastroesophageal reflux disease)   . Hypertension   . Stroke [facility])    TIA   Past Surgical History:  Past Surgical History:  Procedure Laterality Date  . CATARACT EXTRACTION, BILATERAL    . ENDARTERECTOMY Left 09/16/2020   Procedure: LEFT ENDARTERECTOMY CAROTID;  Surgeon: Maeola Harman, MD;  Location: [facility] OR;  Service: Vascular;  Laterality: Left;  . HERNIA REPAIR    . PACEMAKER IMPLANT    . TONSILLECTOMY     HPI:  Pt is an 80 y.o. male with history of TIA underwent carotid endarterectomy in November 2021 with history of proximal atrial fibrillation and history of pacemaker placement. Pt presented to the ED after fall and 15 minutes of difficulty speaking; patient was not able to "bring out words". CT head 2/11: No evidence of acute intracranial hemorrhage or acute demarcated cortical infarction. Moderate cerebral white matter chronic small vessel ischemic disease, similar as compared to the head CT of 09/14/2020   Assessment / Plan / Recommendation Clinical Impression  Pt participated in speech/language evaluation with his wife present. Both parties denied any baseline deficits in speech, language, or cognition. The pt stated that he initially had difficulty "forming his words" but that this has since resolved. His speech and language skills are currently within normal limits and  no overt cognitive deficits were noted. Further skilled SLP services are not clinically indicated at this time. Pt, nursing, and his wife were educated regarding results and recommendations; all parties verbalized understanding as well as agreement with plan of care.    SLP Assessment  SLP Recommendation/Assessment: Patient does not need any further Speech Lanaguage Pathology Services SLP Visit Diagnosis: Aphasia (R47.01)    Follow Up Recommendations  None    Frequency and Duration           SLP Evaluation Cognition  Overall Cognitive Status: Within Functional Limits for tasks assessed Arousal/Alertness: Awake/alert Orientation Level: Oriented X4 Attention: Focused;Sustained Focused Attention: Appears intact Sustained Attention: Appears intact Memory: Appears intact (Immediate: 3/3; delayed: 3/3) Awareness: Appears intact Problem Solving: Appears intact Executive Function: Reasoning;Sequencing Reasoning: Appears intact Sequencing: Appears intact       Comprehension  Auditory Comprehension Overall Auditory Comprehension: Appears within functional limits for tasks assessed Yes/No Questions: Within Functional Limits (Simple & Complex: 10/10) Commands: Within Functional Limits (2 & 3-step: 6/6) Conversation: Complex    Expression Expression Primary Mode of Expression: Verbal Verbal Expression Overall Verbal Expression: Appears within functional limits for tasks assessed Initiation: No impairment Automatic Speech: Counting;Month of year (WNL) Level of Generative/Spontaneous Verbalization: Conversation Repetition: No impairment (5/5) Naming: No impairment (5/5) Pragmatics: No impairment   Oral / Motor  Oral Motor/Sensory Function Overall Oral Motor/Sensory Function: Within functional limits Motor Speech Overall Motor Speech: Appears within functional limits for tasks assessed Respiration: Within functional limits Phonation: Normal Resonance: Within functional  limits Articulation: Within functional limitis Intelligibility: Intelligible Motor Planning: Witnin functional limits Motor Speech Errors: Not applicable  Jazzlyn Huizenga I. Vear Clock, MS, CCC-SLP Acute Rehabilitation Services Office number [phone] Pager [phone]                    Scheryl Marten 12/26/2020, 3:09 PM

## 2020-12-27 DIAGNOSIS — G459 Transient cerebral ischemic attack, unspecified: Secondary | ICD-10-CM | POA: Diagnosis not present

## 2020-12-27 LAB — CBC
HCT: 33.4 % — ABNORMAL LOW (ref 39.0–52.0)
Hemoglobin: 11.4 g/dL — ABNORMAL LOW (ref 13.0–17.0)
MCH: 32 pg (ref 26.0–34.0)
MCHC: 34.1 g/dL (ref 30.0–36.0)
MCV: 93.8 fL (ref 80.0–100.0)
Platelets: 153 10*3/uL (ref 150–400)
RBC: 3.56 MIL/uL — ABNORMAL LOW (ref 4.22–5.81)
RDW: 13.2 % (ref 11.5–15.5)
WBC: 4.5 10*3/uL (ref 4.0–10.5)
nRBC: 0 % (ref 0.0–0.2)

## 2020-12-27 MED ORDER — APIXABAN 5 MG PO TABS: 5.0000 mg | ORAL_TABLET | Freq: Two times a day (BID) | ORAL | 1 refills | Status: DC

## 2020-12-27 NOTE — Discharge Instructions (Signed)
Wound care: To be done once daily  - Remove ACE wrap and cut outer dressing off - You can get in the shower and allow warm water to loosen the bandages closest to the skin - Gently remove remaining layers and gently pat dry with dry paper towels or clean wash cloth - you may apply antibiotic ointment if desired - Apply xeroform or vaseline gauze to skin and then non-adherent pad over that  - re-wrap arm with kerlix and then ACE bandage overlying

## 2020-12-27 NOTE — Final Consult Note (Signed)
Consultant Final Sign-Off Note    Assessment/Final recommendations  [name] is a 80 y.o. male followed by me for LUE skin tear. Patient and wife instructed on wound care at bedside today. Dressing changed with minimal bleeding. Stable for discharge from a trauma standpoint.    Wound care (if applicable): apply non-stick dressings and wrap with dry dressings once daily. Shower to allow gentle removal of layers directly on the skin   Diet at discharge: per primary team   Activity at discharge: per primary team   Follow-up appointment:  2/24 trauma clinic for wound check    Pending results:  Unresulted Labs (From admission, onward)          Start     Ordered   12/27/20 0500  CBC  Daily,   R      12/26/20 1930           Medication recommendations: Recommend holding eliquis for 3-5 days if able    Other recommendations: Continue daily wound care    Thank you for allowing [place] to participate in the care of your patient!  Please consult [place] again if you have further needs for your patient.  Juliet Rude 12/27/2020 7:48 AM    Subjective   Patient reports no further significant bleeding from wound. He has been trying not to bend too much or lift too much with that extremity. He and wife were instructed on wound care at the bedside today. They requested follow up appointment for wound check in trauma clinic.   Objective  Vital signs in last 24 hours: Temp:  [97.7 F (36.5 C)-98 F (36.7 C)] 97.9 F (36.6 C) (02/14 0512) Pulse Rate:  [60-66] 60 (02/14 0512) Resp:  [15-18] 18 (02/14 0512) BP: (117-135)/(61-66) 117/61 (02/14 0512) SpO2:  [95 %-99 %] 96 % (02/14 0512)   Physical Exam: General: pleasant, WD, WN male who is laying in bed in NAD HEENT: head is normocephalic, atraumatic.  Sclera are noninjected.  PERRL.  Ears and nose without any masses or lesions.  Mouth is pink and moist Heart: regular, rate, and rhythm.  Normal s1,s2. No obvious murmurs, gallops, or  rubs noted.  Palpable radial and pedal pulses bilaterally Lungs: CTAB, no wheezes, rhonchi, or rales noted.  Respiratory effort nonlabored Abd: soft, NT, ND MS: LUE wound with minimal bleeding once dressing removed, no signs of infection Skin: warm and dry with no masses, lesions, or rashes Neuro: Cranial nerves 2-12 grossly intact, sensation is normal throughout Psych: A&Ox3 with an appropriate affect.    Pertinent labs and Studies: Recent Labs    12/24/20 1712 12/24/20 1724 12/26/20 0945 12/27/20 0441  WBC 4.9  --  5.1 4.5  HGB 12.7* 12.6* 12.5* 11.4*  HCT 38.2* 37.0* 36.2* 33.4*   BMET Recent Labs    12/24/20 1712 12/24/20 1724 12/26/20 0945  NA 138 138 137  K 4.8 4.7 4.3  CL 105 104 104  CO2 21*  --  23  GLUCOSE 103* 104* 110*  BUN 25* 27* 17  CREATININE 1.28* 1.30* 1.02  CALCIUM 9.2  --  9.1   No results for input(s): LABURIN in the last 72 hours. Results for orders placed or performed during the hospital encounter of 12/24/20  SARS CORONAVIRUS 2 (TAT 6-24 HRS) Nasopharyngeal Nasopharyngeal Swab     Status: None   Collection Time: 12/24/20 11:54 PM   Specimen: Nasopharyngeal Swab  Result Value Ref Range Status   SARS Coronavirus 2 NEGATIVE NEGATIVE Final  Comment: (NOTE) SARS-CoV-2 target nucleic acids are NOT DETECTED.  The SARS-CoV-2 RNA is generally detectable in upper and lower respiratory specimens during the acute phase of infection. Negative results do not preclude SARS-CoV-2 infection, do not rule out co-infections with other pathogens, and should not be used as the sole basis for treatment or other patient management decisions. Negative results must be combined with clinical observations, patient history, and epidemiological information. The expected result is Negative.  Fact Sheet for Patients: HairSlick.no  Fact Sheet for Healthcare Providers: quierodirigir.com  This test is not yet  approved or cleared by the [place] FDA and  has been authorized for detection and/or diagnosis of SARS-CoV-2 by FDA under an Emergency Use Authorization (EUA). This EUA will remain  in effect (meaning this test can be used) for the duration of the COVID-19 declaration under Se ction 564(b)(1) of the Act, 21 U.S.C. section 360bbb-3(b)(1), unless the authorization is terminated or revoked sooner.  Performed at [facility], [address], [place], [place] [address]     Imaging: No results found.

## 2020-12-27 NOTE — TOC Progression Note (Signed)
Transition of Care [facility]) - Progression Note    Patient Details  Name: [name] MRN: [id number] Date of Birth: 02/02/1941  [facility] CM/SW Contact  Nadene Rubins Adria Devon, RN Phone Number: 12/27/2020, 10:40 AM  Clinical Narrative:     Discussed home health PT/OT with patient and wife at bedside. Patient recently had [facility] home health. At this time he does not feel like he needs home health. If he changes his mind he will call PCP to arrange.   Patient from [facility].  Expected Discharge Plan: Home/Self Care Barriers to Discharge: No Barriers Identified  Expected Discharge Plan and Services Expected Discharge Plan: Home/Self Care   Discharge Planning Services: CM Consult Post Acute Care Choice: Home Health Living arrangements for the past 2 months: Apartment Expected Discharge Date: 12/27/20                 DME Agency: NA       HH Arranged: Patient Refused HH           Social Determinants of Health (SDOH) Interventions    Readmission Risk Interventions Readmission Risk Prevention Plan 09/17/2020  Transportation Screening Complete  PCP or Specialist Appt within 5-7 Days Complete  Home Care Screening Complete  Medication Review (RN CM) Complete

## 2020-12-28 ENCOUNTER — Ambulatory Visit (INDEPENDENT_AMBULATORY_CARE_PROVIDER_SITE_OTHER): Payer: Medicare Other

## 2020-12-28 DIAGNOSIS — I480 Paroxysmal atrial fibrillation: Secondary | ICD-10-CM

## 2020-12-28 LAB — CUP PACEART REMOTE DEVICE CHECK
Battery Remaining Longevity: 125 mo
Battery Remaining Percentage: 95.5 %
Battery Voltage: 2.98 V
Brady Statistic AP VP Percent: 8.4 %
Brady Statistic AP VS Percent: 90 %
Brady Statistic AS VP Percent: 1 %
Brady Statistic AS VS Percent: 1 %
Brady Statistic RA Percent Paced: 97 %
Brady Statistic RV Percent Paced: 8.5 %
Date Time Interrogation Session: 20220215020015
Implantable Lead Implant Date: 20160928
Implantable Lead Implant Date: 20160928
Implantable Lead Location: 753859
Implantable Lead Location: 753860
Implantable Lead Model: 1948
Implantable Pulse Generator Implant Date: 20160928
Lead Channel Impedance Value: 400 Ohm
Lead Channel Impedance Value: 580 Ohm
Lead Channel Pacing Threshold Amplitude: 0.75 V
Lead Channel Pacing Threshold Amplitude: 0.875 V
Lead Channel Pacing Threshold Pulse Width: 0.4 ms
Lead Channel Pacing Threshold Pulse Width: 0.4 ms
Lead Channel Sensing Intrinsic Amplitude: 2.1 mV
Lead Channel Sensing Intrinsic Amplitude: 3.9 mV
Lead Channel Setting Pacing Amplitude: 1.125
Lead Channel Setting Pacing Amplitude: 1.75 V
Lead Channel Setting Pacing Pulse Width: 0.4 ms
Lead Channel Setting Sensing Sensitivity: 0.7 mV
Pulse Gen Model: 2240
Pulse Gen Serial Number: 7817736

## 2021-01-03 ENCOUNTER — Telehealth: Payer: Self-pay | Admitting: Adult Health

## 2021-01-03 NOTE — Telephone Encounter (Signed)
Pt called wanting to speak to the RN regarding another TIA he had on 12/24/20. Please advise.

## 2021-01-03 NOTE — Discharge Summary (Signed)
[facility] Discharge Summary   Patient: [name] WUJ:[id number]  PCP: Patient, No Pcp Per  Date of admission: 12/24/2020   Date of discharge: 12/27/2020     Discharge Diagnoses:  Principal Problem:   TIA (transient ischemic attack) Active Problems:   Presence of permanent cardiac pacemaker   Essential hypertension   PAF (paroxysmal atrial fibrillation) (HCC)   Admitted From: home Disposition:  Home   Recommendations for Outpatient Follow-up:  1. PCP: please follow up with PCP in 1 week 2. Follow up LABS/TEST:  CBC   Follow-up Information    [facility] [place]. Go on 01/06/2021.   Why: Follow up appointment for wound check scheduled for 10:40 AM. Please arrive 30 min prior to appointment time. Bring photo ID and insurance information with you. Contact information: [address] [place] [address] [phone]       PCP. Schedule an appointment as soon as possible for a visit in 1 week(s).   Why: check CBC in 1 week             Discharge Instructions    Diet - low sodium heart healthy   Complete by: As directed    Discharge wound care:   Complete by: As directed    apply non-stick dressings and wrap with dry dressings once daily. Shower to allow gentle removal of layers directly on the skin   Increase activity slowly   Complete by: As directed       Diet recommendation: Cardiac diet  Activity: The patient is advised to gradually reintroduce usual activities, as tolerated  Discharge Condition: stable  Code Status: Full code   History of present illness: As per the H and P dictated on admission, " [name] is a 80 y.o. male with history of TIA underwent carotid endarterectomy in November 2021 with history of proximal atrial fibrillation and history of pacemaker placement presently on Eliquis had a fall today when patient was trying to get caught for his granddaughter.  After the fall patient states he did well  and went back home and had a bruise on his left forearm which was getting cleaned by his wife.  When his wife noticed that patient had about 15 minutes of difficulty speaking where patient was not able to bring out words.  Did not have any weakness of the extremities or any visual symptoms.  Symptoms resolved by the time patient reached the ER.  ED Course: In the ER CT angiogram of the head and neck and CT head was unremarkable.  Neurologist on-call was consulted admitted for further observation.  Patient passed swallow evaluation.  On exam patient is able to move all extremities.  Patient does have a skin tear on the left upper extremity.  EKG shows normal sinus rhythm.  Labs show hemoglobin of 12.7 creatinine 1.2 Covid test negative."  Hospital Course:  Summary of his active problems in the hospital is as following. 1.  TIA/CVA ruled out. Syncope Patient presented with a fall while climbing the curb.  Unsure whether he passed out or not but he does not remember whether he hit his head or not.  He injured his left elbow with some bleeding After that he drove back home and while dressing his wound started feeling dizzy and lightheaded and having difficulty speaking. EMS was called and patient was brought to the hospital. CTA head and neck shows evidence of findings consistent with left carotid endarterectomy with a potential flap which might  be chronic. MRI brain unable to perform secondary to pacemaker. Currently symptoms are improving.  Patient appears to be back to baseline. PT recommends home health, OT recommends no follow-up. On aspirin and Eliquis.  Eliquis will be on hold until his bleeding resolves. On Lipitor 40 mg, LDL 34.  2.  Continuous bleeding from laceration on left elbow Patient sustained injury to his left elbow after a fall. Has ongoing bleeding from the site. Dry dressing did not stop bleeding. Hemostatic dressing did not stop bleeding with pressure. Trauma surgery was  consulted currently quick clot dressing applied with Ace wrap. Due to ongoing bleeding from the site despite multiple attempts and inability to stitch the site together currently we will be holding Eliquis. Discussed with patient in detail regarding his risk for increased stroke when he is off of Eliquis especially in the setting of A. fib and recent presentation with near syncope. Discussed with vascular surgery as well as neurology regarding the risk.  From vascular surgery point of view patient must continue on aspirin. Patient and family verbalized understanding of the risk for increased stroke and currently agreeable to stop the Eliquis until recommended by trauma surgery to resume.  3.  Paroxysmal A. fib Pacemaker implant On Eliquis and aspirin.  Also on rate control medication.  Continue to monitor.  4.  Chronic kidney disease stage II Renal function appears to be elevated chronically. We will continue to monitor.  Patient was seen by physical therapy, who recommended No therapy needed on discharge. On the day of the discharge the patient's vitals were stable, and no other acute medical condition were reported by patient. The patient was felt safe to be discharge at Home.  Consultants: trauma surgery Procedures: none  DISCHARGE MEDICATION: Allergies as of 12/27/2020   No Known Allergies     Medication List    TAKE these medications   albuterol 108 (90 Base) MCG/ACT inhaler Commonly known as: VENTOLIN HFA Inhale 2 puffs into the lungs 4 (four) times daily as needed for wheezing or shortness of breath.   apixaban 5 MG Tabs tablet Commonly known as: ELIQUIS Take 1 tablet (5 mg total) by mouth 2 (two) times daily.   Arnuity Ellipta 200 MCG/ACT Aepb Generic drug: Fluticasone Furoate Inhale 1 puff into the lungs every morning.   aspirin EC 81 MG tablet Take 1 tablet (81 mg total) by mouth daily. Swallow whole.   atorvastatin 40 MG tablet Commonly known as: LIPITOR Take  1 tablet (40 mg total) by mouth daily.   cholecalciferol 25 MCG (1000 UNIT) tablet Commonly known as: VITAMIN D Take 1,000 Units by mouth at bedtime.   FISH OIL PO Take 1 capsule by mouth every morning.   Magnesium 250 MG Tabs Take 250 mg by mouth every morning.   metoprolol tartrate 50 MG tablet Commonly known as: LOPRESSOR Take 25 mg by mouth daily. Half tab of 50mg    multivitamin with minerals Tabs tablet Take 1 tablet by mouth every morning.   omeprazole 40 MG capsule Commonly known as: PRILOSEC Take 40 mg by mouth daily before supper.            Discharge Care Instructions  (From admission, onward)         Start     Ordered   12/27/20 0000  Discharge wound care:       Comments: apply non-stick dressings and wrap with dry dressings once daily. Shower to allow gentle removal of layers directly on the skin  12/27/20 1018          Discharge Exam: Filed Weights   12/24/20 1700 12/24/20 1721  Weight: 76.5 kg 76.5 kg   Vitals:   12/27/20 0759 12/27/20 0822  BP: 131/66   Pulse: 66 67  Resp: 16 16  Temp: 97.8 F (36.6 C)   SpO2: 98% 98%   General: Appear in no distress, no Rash; Oral Mucosa Clear, moist. no Abnormal Neck Mass Or lumps, Conjunctiva normal  Cardiovascular: S1 and S2 Present, no Murmur Respiratory: good respiratory effort, Bilateral Air entry present and CTA, no Crackles, no wheezes Abdomen: Bowel Sound present, Soft and no tenderness Extremities: no Pedal edema Neurology: alert and oriented to time, place, and person affect appropriate. no new focal deficit  The results of significant diagnostics from this hospitalization (including imaging, microbiology, ancillary and laboratory) are listed below for reference.    Significant Diagnostic Studies: CT ANGIO HEAD W OR WO CONTRAST  Addendum Date: 12/24/2020   ADDENDUM REPORT: 12/24/2020 18:37 ADDENDUM: No large vessel occlusion. These results were called by telephone at the time of  interpretation on 12/24/2020 at 5:55 pm to provider ERIC [facility] , who verbally acknowledged these results. Dr. 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 Electronically Signed   By: Jackey Loge DO   On: 12/24/2020 18:37   Result Date: 12/24/2020 CLINICAL DATA:  Code stroke.  Aphasia, now nearly resolved. EXAM: CT ANGIOGRAPHY HEAD AND NECK TECHNIQUE: Multidetector CT imaging of the head and neck was performed using the standard protocol during bolus administration of intravenous contrast. Multiplanar CT image reconstructions and MIPs were obtained to evaluate the vascular anatomy. Carotid stenosis measurements (when applicable) are obtained utilizing NASCET criteria, using the distal internal carotid diameter as the denominator. CONTRAST:  75mL OMNIPAQUE IOHEXOL 350 MG/ML SOLN COMPARISON:  CT angiogram head/neck 09/14/2020. FINDINGS: CTA NECK FINDINGS Aortic arch: Standard aortic branching. Atherosclerotic plaque within the visualized aortic arch and proximal major branch vessels of the neck. No hemodynamically significant innominate or proximal subclavian artery stenosis. Right carotid system: CCA and ICA patent within the neck without significant stenosis (50% or greater). Mild to moderate calcified plaque within the carotid bifurcation and proximal ICA. Left carotid system: Interval left carotid endarterectomy. CCA and ICA patent within the neck without stenosis. 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 No more than mild vessel narrowing at this site Vertebral arteries: Patent within the neck bilaterally. Left vertebral artery dominant. Redemonstrated mild/moderate stenosis at the origin of the left vertebral artery. Skeleton: Cervical dextrocurvature. Reversal of the expected cervical lordosis. Cervical  spondylosis with levels of degenerative fusion. No acute bony abnormality or aggressive osseous lesion. Other neck: Redemonstrated enlarged heterogeneous right thyroid lobe. Upper chest: No consolidation within the imaged lung apices. Review of the MIP images confirms the above findings CTA HEAD FINDINGS Anterior circulation: The intracranial internal carotid arteries are patent. Calcified plaque within both vessels with no more than mild stenosis. The M1 middle cerebral arteries are patent. No M2 proximal branch occlusion or high-grade proximal stenosis is identified. The anterior cerebral arteries are patent. No intracranial aneurysm is identified. Posterior circulation: The intracranial vertebral arteries are patent. Calcified plaque within the V4 left vertebral artery with mild stenosis. The basilar artery is patent. The posterior cerebral arteries are patent. A left posterior communicating artery is present.  The right posterior communicating artery is hypoplastic or absent Venous sinuses: As permitted by contrast timing, patent. Anatomic variants: As described Review of the MIP images confirms the above findings IMPRESSION: CTA neck: 1. Interval left carotid endarterectomy. The left common and internal carotid arteries are now patent within the neck without stenosis. 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 Right common carotid and internal carotid arteries patent within the neck without hemodynamically significant stenosis. Unchanged mild to moderate calcified plaque within the carotid bifurcation and proximal ICA. 3. Vertebral arteries patent within the neck. Unchanged mild/moderate atherosclerotic narrowing at the origin of the left vertebral artery 4. Redemonstrated enlarged heterogeneous right thyroid lobe. Nonemergent thyroid ultrasound is recommended for further  evaluation. CTA head: 1. No intracranial large vessel occlusion or proximal high-grade arterial stenosis. 2. Calcified plaque within the intracranial ICAs with no more than mild stenosis. 3. Calcified plaque within the V4 left vertebral artery with mild stenosis at this site. Electronically Signed: By: Jackey LogeKyle  Golden DO On: 12/24/2020 18:06   DG Elbow 2 Views Left  Result Date: 12/25/2020 CLINICAL DATA:  Pain following fall EXAM: LEFT ELBOW - 4 VIEW COMPARISON:  None. FINDINGS: Frontal, lateral, and bilateral oblique views were obtained. There is no fracture or dislocation. No joint effusion. There are scattered foci of intra-articular calcification. No appreciable joint space narrowing. IMPRESSION: No fracture or dislocation. No appreciable joint space narrowing. Foci of intra-articular calcification raise question of underlying synovial chondromatosis. Electronically Signed   By: Bretta BangWilliam  Woodruff III M.D.   On: 12/25/2020 13:14   ECHOCARDIOGRAM COMPLETE  Result Date: 12/25/2020    ECHOCARDIOGRAM REPORT   Patient Name:   Elmon ElseETER Lechuga Date of Exam: 12/25/2020 Medical Rec #:  [id number]   Height:       70.0 in Accession #:    [id number]  Weight:       168.7 lb Date of Birth:  1940-12-21   BSA:          1.941 m Patient Age:    79 years    BP:           138/62 mmHg Patient Gender: M           HR:           61 bpm. Exam Location:  Inpatient Procedure: 2D Echo, Color Doppler and Cardiac Doppler Indications:    TIA  History:        Patient has prior history of Echocardiogram examinations, most                 recent 09/12/2020. Pacemaker, Arrythmias:Atrial Fibrillation;                 Risk Factors:Hypertension.  Sonographer:    Irving BurtonEmily Senior RDCS Referring Phys: [phone] Meryle ReadyRSHAD N [facility] Hospital[facility]AKRAKANDY  Sonographer Comments: Technically difficult due to lung/rib interference. IMPRESSIONS  1. Left ventricular ejection fraction, by estimation, is 60 to 65%. The left ventricle has normal function. The left ventricle has no regional wall  motion abnormalities. There is mild concentric left ventricular hypertrophy. Left ventricular diastolic parameters are consistent with Grade I diastolic dysfunction (impaired relaxation).  2. Right ventricular systolic function is normal. The right ventricular size is normal. There is normal pulmonary artery systolic pressure.  3. The mitral valve is grossly normal. Trivial mitral valve regurgitation.  4. The aortic valve is tricuspid. Aortic valve regurgitation is  trivial.  5. Aortic dilatation noted. There is mild dilatation of the aortic root, measuring 39 mm. There is mild dilatation of the ascending aorta, measuring 37 mm.  6. The inferior vena cava is normal in size with greater than 50% respiratory variability, suggesting right atrial pressure of 3 mmHg. Comparison(s): No significant change from prior study. Conclusion(s)/Recommendation(s): No intracardiac source of embolism detected on this transthoracic study. A transesophageal echocardiogram is recommended to exclude cardiac source of embolism if clinically indicated. FINDINGS  Left Ventricle: Left ventricular ejection fraction, by estimation, is 60 to 65%. The left ventricle has normal function. The left ventricle has no regional wall motion abnormalities. The left ventricular internal cavity size was normal in size. There is  mild concentric left ventricular hypertrophy. Left ventricular diastolic parameters are consistent with Grade I diastolic dysfunction (impaired relaxation). Right Ventricle: The right ventricular size is normal. No increase in right ventricular wall thickness. Right ventricular systolic function is normal. There is normal pulmonary artery systolic pressure. The tricuspid regurgitant velocity is 2.05 m/s, and  with an assumed right atrial pressure of 3 mmHg, the estimated right ventricular systolic pressure is 19.8 mmHg. Left Atrium: Left atrial size was normal in size. Right Atrium: Right atrial size was normal in size. Pericardium:  There is no evidence of pericardial effusion. Mitral Valve: The mitral valve is grossly normal. There is mild thickening of the mitral valve leaflet(s). Mild mitral annular calcification. Trivial mitral valve regurgitation. Tricuspid Valve: The tricuspid valve is normal in structure. Tricuspid valve regurgitation is mild. Aortic Valve: The aortic valve is tricuspid. Aortic valve regurgitation is trivial. Pulmonic Valve: The pulmonic valve was not well visualized. Pulmonic valve regurgitation is not visualized. Aorta: Aortic dilatation noted. There is mild dilatation of the aortic root, measuring 39 mm. There is mild dilatation of the ascending aorta, measuring 37 mm. Venous: The inferior vena cava is normal in size with greater than 50% respiratory variability, suggesting right atrial pressure of 3 mmHg. IAS/Shunts: No atrial level shunt detected by color flow Doppler. Additional Comments: A pacer wire is visualized.  LEFT VENTRICLE PLAX 2D LVIDd:         4.50 cm  Diastology LVIDs:         2.60 cm  LV e' medial:    8.16 cm/s LV PW:         1.20 cm  LV E/e' medial:  6.3 LV IVS:        1.00 cm  LV e' lateral:   7.62 cm/s LVOT diam:     2.00 cm  LV E/e' lateral: 6.7 LV SV:         53 LV SV Index:   27 LVOT Area:     3.14 cm  RIGHT VENTRICLE RV S prime:     8.49 cm/s TAPSE (M-mode): 2.2 cm LEFT ATRIUM             Index       RIGHT ATRIUM           Index LA diam:        3.80 cm 1.96 cm/m  RA Area:     19.20 cm LA Vol (A2C):   59.4 ml 30.60 ml/m RA Volume:   54.60 ml  28.12 ml/m LA Vol (A4C):   63.5 ml 32.71 ml/m LA Biplane Vol: 67.5 ml 34.77 ml/m  AORTIC VALVE LVOT Vmax:         75.70 cm/s LVOT Vmean:        51.100  cm/s LVOT VTI:          0.168 m AR Vena Contracta: 0.40 cm  AORTA Ao Root diam: 3.60 cm Ao Asc diam:  3.70 cm MITRAL VALVE               TRICUSPID VALVE MV Area (PHT): 2.36 cm    TR Peak grad:   16.8 mmHg MV Decel Time: 322 msec    TR Vmax:        205.00 cm/s MV E velocity: 51.40 cm/s MV A velocity:  61.70 cm/s  SHUNTS MV E/A ratio:  0.83        Systemic VTI:  0.17 m                            Systemic Diam: 2.00 cm Laurance Flatten MD Electronically signed by Laurance Flatten MD Signature Date/Time: 12/25/2020/1:56:40 PM    Final    CUP PACEART REMOTE DEVICE CHECK  Result Date: 12/28/2020 Scheduled remote reviewed. Normal device function.  Next remote 91 days- [employer]Box, RN/CVRS  CT HEAD CODE STROKE WO CONTRAST  Result Date: 12/24/2020 CLINICAL DATA:  Code stroke.  Aphasia, now nearly resolved. EXAM: CT HEAD WITHOUT CONTRAST TECHNIQUE: Contiguous axial images were obtained from the base of the skull through the vertex without intravenous contrast. COMPARISON:  Prior head CT 09/14/2020, CT angiogram head/neck 09/14/2020. FINDINGS: Brain: Mild cerebral and cerebellar atrophy. Moderate patchy and confluent ill-defined hypoattenuation within the cerebral white matter is nonspecific, but compatible with chronic small vessel ischemic disease. There is no acute intracranial hemorrhage. No demarcated cortical infarct. No extra-axial fluid collection. No evidence of intracranial mass. No midline shift. Vascular: No hyperdense vessel.  Atherosclerotic calcifications Skull: Normal. Negative for fracture or focal lesion. Sinuses/Orbits: Visualized orbits show no acute finding. No significant paranasal sinus disease at the imaged levels ASPECTS [facility] Stroke Program Early CT Score) - Ganglionic level infarction (caudate, lentiform nuclei, internal capsule, insula, M1-M3 cortex): 7 - Supraganglionic infarction (M4-M6 cortex): 3 Total score (0-10 with 10 being normal): 10 These results were called by telephone at the time of interpretation on 12/24/2020 at 5:36 pm to provider Dr. Otelia Limes, who verbally acknowledged these results. IMPRESSION: No evidence of acute intracranial hemorrhage or acute demarcated cortical infarction. Moderate cerebral white matter chronic small vessel ischemic disease, similar as compared to the  head CT of 09/14/2020 Stable mild generalized atrophy of the brain. Electronically Signed   By: Jackey Loge DO   On: 12/24/2020 17:41   CT ANGIO NECK CODE STROKE  Addendum Date: 12/24/2020   ADDENDUM REPORT: 12/24/2020 18:37 ADDENDUM: No large vessel occlusion. These results were called by telephone at the time of interpretation on 12/24/2020 at 5:55 pm to provider ERIC [facility] , who verbally acknowledged these results. Dr. 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 Electronically Signed   By: Jackey Loge DO   On: 12/24/2020 18:37   Result Date: 12/24/2020 CLINICAL DATA:  Code stroke.  Aphasia, now nearly resolved. EXAM: CT ANGIOGRAPHY HEAD AND NECK TECHNIQUE: Multidetector CT imaging of the head and neck was performed using the standard protocol during bolus administration of intravenous contrast. Multiplanar CT image reconstructions and MIPs were obtained to evaluate the vascular anatomy. Carotid stenosis measurements (when applicable) are obtained utilizing NASCET criteria, using the distal internal carotid diameter as the denominator. CONTRAST:  75mL OMNIPAQUE  IOHEXOL 350 MG/ML SOLN COMPARISON:  CT angiogram head/neck 09/14/2020. FINDINGS: CTA NECK FINDINGS Aortic arch: Standard aortic branching. Atherosclerotic plaque within the visualized aortic arch and proximal major branch vessels of the neck. No hemodynamically significant innominate or proximal subclavian artery stenosis. Right carotid system: CCA and ICA patent within the neck without significant stenosis (50% or greater). Mild to moderate calcified plaque within the carotid bifurcation and proximal ICA. Left carotid system: Interval left carotid endarterectomy. CCA and ICA patent within the neck without stenosis. New from the prior exam, there is an apparent intimal flap within the proximal ECA, likely reflecting a  dissection. No more than mild vessel narrowing at this site Vertebral arteries: Patent within the neck bilaterally. Left vertebral artery dominant. Redemonstrated mild/moderate stenosis at the origin of the left vertebral artery. Skeleton: Cervical dextrocurvature. Reversal of the expected cervical lordosis. 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 No acute bony abnormality or aggressive osseous lesion. Other neck: Redemonstrated enlarged heterogeneous right thyroid lobe. Upper chest: No consolidation within the imaged lung apices. Review of the MIP images confirms the above findings CTA HEAD FINDINGS Anterior circulation: The intracranial internal carotid arteries are patent. Calcified plaque within both vessels with no more than mild stenosis. The M1 middle cerebral arteries are patent. No M2 proximal branch occlusion or high-grade proximal stenosis is identified. The anterior cerebral arteries are patent. No intracranial aneurysm is identified. Posterior circulation: The intracranial vertebral arteries are patent. Calcified plaque within the V4 left vertebral artery with mild stenosis. The basilar artery is patent. The posterior cerebral arteries are patent. A left posterior communicating artery is present. The right posterior communicating artery is hypoplastic or absent Venous sinuses: As permitted by contrast timing, patent. Anatomic variants: As described Review of the MIP images confirms the above findings IMPRESSION: CTA neck: 1. Interval left carotid endarterectomy. The left common and internal carotid arteries are now patent within the neck without stenosis. 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 Right common carotid and internal carotid arteries patent within the neck without hemodynamically significant stenosis. Unchanged mild to moderate calcified  plaque within the carotid bifurcation and proximal ICA. 3. Vertebral arteries patent within the neck. Unchanged mild/moderate atherosclerotic narrowing at the origin of the left vertebral artery 4. Redemonstrated enlarged heterogeneous right thyroid lobe. Nonemergent thyroid ultrasound is recommended for further evaluation. CTA head: 1. No intracranial large vessel occlusion or proximal high-grade arterial stenosis. 2. Calcified plaque within the intracranial ICAs with no more than mild stenosis. 3. Calcified plaque within the V4 left vertebral artery with mild stenosis at this site. Electronically Signed: By: Jackey Loge DO On: 12/24/2020 18:06    Microbiology: Recent Results (from the past 240 hour(s))  SARS CORONAVIRUS 2 (TAT 6-24 HRS) Nasopharyngeal Nasopharyngeal Swab     Status: None   Collection Time: 12/24/20 11:54 PM   Specimen: Nasopharyngeal Swab  Result Value Ref Range Status   SARS Coronavirus 2 NEGATIVE NEGATIVE Final    Comment: (NOTE) SARS-CoV-2 target nucleic acids are NOT DETECTED.  The SARS-CoV-2 RNA is generally detectable in upper and lower respiratory specimens during the acute phase of infection. Negative results do not preclude SARS-CoV-2 infection, do not rule out co-infections with other pathogens, and should not be used as the sole basis for treatment or other patient management decisions. Negative results must be combined with clinical observations, patient history, and epidemiological information.  The expected result is Negative.  Fact Sheet for Patients: HairSlick.no  Fact Sheet for Healthcare Providers: quierodirigir.com  This test is not yet approved or cleared by the [place] FDA and  has been authorized for detection and/or diagnosis of SARS-CoV-2 by FDA under an Emergency Use Authorization (EUA). This EUA will remain  in effect (meaning this test can be used) for the duration of the COVID-19  declaration under Se ction 564(b)(1) of the Act, 21 U.S.C. section 360bbb-3(b)(1), unless the authorization is terminated or revoked sooner.  Performed at [facility], [address] [place], [place] [address]      Labs: CBC: No results for input(s): WBC, NEUTROABS, HGB, HCT, MCV, PLT in the last 168 hours. Basic Metabolic Panel: No results for input(s): NA, K, CL, CO2, GLUCOSE, BUN, CREATININE, CALCIUM, MG, PHOS in the last 168 hours. Liver Function Tests: No results for input(s): AST, ALT, ALKPHOS, BILITOT, PROT, ALBUMIN in the last 168 hours. CBG: No results for input(s): GLUCAP in the last 168 hours.  Time spent: 35 minutes  Signed:  Lynden Oxford  [facility] 12/27/2020 2:03 PM

## 2021-01-03 NOTE — Telephone Encounter (Signed)
FYI to [facility] pt called back to say he was offered  Available appointment on 2-24 he called back to accept. Pt informed that if RN has questions or concerns as to why this appointment has been scheduled she will call him

## 2021-01-04 NOTE — Progress Notes (Signed)
Remote pacemaker transmission.   

## 2021-01-06 ENCOUNTER — Ambulatory Visit (INDEPENDENT_AMBULATORY_CARE_PROVIDER_SITE_OTHER): Payer: Medicare Other | Admitting: Adult Health

## 2021-01-06 ENCOUNTER — Other Ambulatory Visit: Payer: Self-pay

## 2021-01-06 ENCOUNTER — Encounter: Payer: Self-pay | Admitting: Adult Health

## 2021-01-06 VITALS — BP 142/72 | HR 70 | Ht 70.0 in | Wt 170.0 lb

## 2021-01-06 DIAGNOSIS — G459 Transient cerebral ischemic attack, unspecified: Secondary | ICD-10-CM

## 2021-01-06 NOTE — Progress Notes (Signed)
[facility] [address] [place].  [address] [phone]       STROKE FOLLOW UP NOTE  [name] Date of Birth:  02-12-1941 Medical Record Number:  [id number]   Reason for Referral: f/u recent TIA vs syncopal event     SUBJECTIVE:   CHIEF COMPLAINT:  Chief Complaint  Patient presents with  . Follow-up    Rm 14 , with wife ,TIA fu, states he feels fine    HPI:   Today, 01/06/2021, [name] is being seen for recent TIA hospital follow-up.  He presented to [facility] ED on 12/24/2020 with notes, lab work and imaging personally reviewed with reported dizziness and lightheadedness and difficulty speaking after a fall.  He has had prior TIAs with speech difficulty with this event similar to prior episodes but not as bad and symptoms completely resolved.  Evaluated by Dr. Pearlean Brownie with CT head negative and CTA L carotid endarterectomy patent without significant stenosis.  MRI unable to be obtained due to pacer.  LDL 34.  A1c 5.6.  It was felt as though his symptoms possibly more related to syncopal event vs TIA although he does have stroke risk factors including atrial flutter on Eliquis, HLD on atorvastatin, HTN and prediabetes.  Since that time, he has not had any additional or new stroke/TIA symptoms.   Reports compliance on Eliquis 5 mg twice daily, aspirin 81 mg daily and atorvastatin 40 mg daily -denies side effects Blood pressure today 142/72 - monitors at home and typically stable  No new concerns at this time    History provided for reference purposes only Initial visit 10/21/2020 JM: [name] is being seen for hospital follow-up accompanied by his wife.  He has been doing well since discharge without new or reoccurring stroke/TIA symptoms.  Remains on aspirin and Eliquis for secondary stroke prevention with known AF and s/p L CEA without bleeding or bruising.  Remains on atorvastatin 40 mg daily without myalgias.  Blood pressure today satisfactory  136/61.  Wife concerned regarding possible apnea with sleep study 3 years ago in [place], [place] diagnosed with mild sleep apnea per patient (unable to view via epic) and no treatment indicated.  Since that time, increased nocturia q2-3hrs, witnessed apnea and snoring.  Use of some type of mouthguard and head elevation while sleeping which has decreased snoring and apnea. No further concerns.   Stroke admission 09/11/2020 [name] is a 80 y.o. male who recently moved to [facility] from [place] with a PMHx of Htn, GERD, ? atrial fibrillation, bradycardia s/p pacemaker in 2016.  Presented on 09/11/2020 with word finding difficulty and headache.  Personally reviewed pertinent hospitalization progress notes, lab work and imaging with summary provided.  Evaluated by Dr. Roda Shutters with likely TIA secondary to AF not on AC vs soft plaque at left ICA bulb.  MRI unable to be obtained d/t noncompatible pacer.  Pacer interrogation +SVT and short run of a flutter.  Questionable history of A. fib which was confirmed by prior cardiologist but low burden therefore AC not started.  In setting of recent TIA, initiated Eliquis for secondary stroke prevention. Hx of HTN stable and resumed home dose metoprolol. Hx of HLD on pravastatin with LDL 58.  Other stroke risk factors include advanced age, EtOH use and family history of strokes but no personal prior history of strokes.  Other active problems include anemia, aortic arthrosclerosis, bradycardia s/p pacer and GERD.  Evaluated by therapies without additional therapy needs and discharged home  in stable condition on 09/14/2020.  He returned shortly after discharge on 09/14/2020 with recurrence of speech and language difficulty.  CTA head/neck repeated negative LVO or worsening stenosis with stable fibrofatty plaque of left ICA origin.  Vascular surgery consulted and underwent left CEA without complication.  Initiated aspirin in addition to Eliquis at discharge.  Symptoms resolved  and discharged home without therapy needs on 09/17/2020.   09/11/2020 TIA secondary to AF not on AC vs. Soft plaque at left ICA bulb  CT head - no acute abnormality  MRI head - not able to do due to pacer not compatible with MRI  CTA H&N - No emergent large vessel occlusion or high-grade stenosis of the intracranial arteries. Eccentric plaque within the proximal left internal carotid artery may be at increased risk of embolus formation. Bilateral carotid bifurcation atherosclerosis without hemodynamically significant stenosis by NASCET criteria.   CT repeat Unremarkable   2D Echo - EF 55-60%. No source of embolus   Pacemaker interrogation +SVT, short run of Aflutter per Dr. 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 ASPECTS 10  CTA H&N repeat - stable. No LVO or significant stenosis. Irregular L ICA plaque at origin, unchanged.   S/p L CEA for high risk soft plaque left ICA  LDL - 58 -initiated atorvastatin 40 mg daily  HgbA1c - 5.4  UDS - negative  VTE prophylaxis - Eliquis  aspirin 81 mg dailyprior to admission, on ASA 81mg  post left CEA. Will be discharged with ASA 81 and eliquis 5mg  bid.      ROS:   14 system review of systems performed and negative with exception of those listed in HPI  PMH:  Past Medical History:  Diagnosis Date  . Carotid artery occlusion   . GERD (gastroesophageal reflux disease)   . Hypertension   . Stroke [facility])    TIA    PSH:  Past Surgical History:  Procedure Laterality Date  . CATARACT EXTRACTION, BILATERAL    . ENDARTERECTOMY Left 09/16/2020   Procedure: LEFT  ENDARTERECTOMY CAROTID;  Surgeon: [facility], MD;  Location: [facility] OR;  Service: Vascular;  Laterality: Left;  . HERNIA REPAIR    . PACEMAKER IMPLANT    . TONSILLECTOMY      Social History:  Social History   Socioeconomic History  . Marital status: Married    Spouse name: Not on file  . Number of children: Not on file  . Years of education: Not on file  . Highest education level: Not on file  Occupational History  . Not on file  Tobacco Use  . Smoking status: Former Maeola Harman  . Smokeless tobacco: Never Used  Vaping Use  . Vaping Use: Never used  Substance and Sexual Activity  . Alcohol use: Yes    Comment: occassional  . Drug use: Not on file  . Sexual activity: Not Currently  Other Topics Concern  . Not on file  Social History Narrative  . Not on file   Social Determinants of Health   Financial Resource Strain: Not on file  Food Insecurity: Not on file  Transportation Needs: Not on file  Physical Activity: Not on file  Stress: Not on file  Social Connections: Not on file  Intimate Partner Violence: Not on file    Family History:  Family History  Family history unknown: Yes    Medications:   Current Outpatient Medications on File Prior to Visit  Medication Sig Dispense Refill  . albuterol (VENTOLIN HFA) 108 (90 Base) MCG/ACT inhaler Inhale 2 puffs into the lungs 4 (four) times daily as needed for wheezing or shortness of breath.    Marland Kitchen. apixaban (ELIQUIS) 5 MG TABS tablet Take 1 tablet (5 mg total) by mouth 2 (two) times daily. 60 tablet 1  . aspirin EC 81 MG tablet Take 1 tablet (81 mg total) by mouth daily. Swallow whole. 30 tablet 1  . atorvastatin (LIPITOR) 40 MG tablet Take 1 tablet (40 mg total) by mouth daily. 30 tablet 1  . cholecalciferol (VITAMIN D) 25 MCG (1000 UNIT) tablet Take 1,000 Units by mouth at bedtime.    . Fluticasone Furoate (ARNUITY ELLIPTA) 200 MCG/ACT AEPB Inhale 1 puff into the lungs every morning.    . Magnesium 250 MG TABS  Take 250 mg by mouth every morning.    . metoprolol tartrate (LOPRESSOR) 50 MG tablet Take 25 mg by mouth daily. Half tab of 50mg      . Multiple Vitamin (MULTIVITAMIN WITH MINERALS) TABS tablet Take 1 tablet by mouth every morning.    . Omega-3 Fatty Acids (FISH OIL PO) Take 1 capsule by mouth every morning.    Marland Kitchen. omeprazole (PRILOSEC) 40 MG capsule Take 40 mg by mouth daily before supper.     No current facility-administered medications on file prior to visit.    Allergies:  No Known Allergies    OBJECTIVE:  Physical Exam  Vitals:   01/06/21 1026  BP: (!) 142/72  Pulse: 70  Weight: 170 lb (77.1 kg)  Height: 5\' 10"  (1.778 m)   Body mass index is 24.39 kg/m. No exam data present  General: well developed, well nourished,  very pleasant elderly Caucasian male, seated, in no evident distress Head: head normocephalic and atraumatic.   Neck: supple with no carotid or supraclavicular bruits Cardiovascular: regular rate and rhythm, no murmurs Musculoskeletal: no deformity Skin:  no rash/petichiae Vascular:  Normal pulses all extremities   Neurologic Exam Mental Status: Awake and fully alert.   Fluent speech and language.  Oriented to place and time. Recent and remote memory intact. Attention span, concentration and fund of knowledge appropriate. Mood and affect appropriate.  Cranial Nerves: Pupils equal, briskly reactive to light. Extraocular movements full without nystagmus. Visual fields full to confrontation.  HOH bilaterally. Facial sensation intact. Face, tongue, palate moves normally and symmetrically.  Motor: Normal bulk and tone. Normal strength in all tested extremity muscles. Sensory.: intact to touch , pinprick , position and vibratory sensation.  Coordination: Rapid alternating movements normal in all extremities. Finger-to-nose and heel-to-shin performed accurately bilaterally. Gait and Station: Arises from chair without difficulty. Stance is normal. Gait demonstrates  normal stride length and balance without use of assistive device. Reflexes: 1+ and symmetric. Toes downgoing.         ASSESSMENT: Elmon Elseeter Leder is a 80 y.o. year old male presented with aphasia on 09/11/2020 likely TIA secondary to AF not on AC vs L ICA bulb soft plaque.  Pacer interrogated which showed short run of a flutter w/ confirmed history of A. Fib (initially questioned but confirmed with  cardiologist in [place]) [facility] due to low burden but in setting of recent event Eliquis initiated.  Returned on 09/14/2020 with recurrence of aphasia likely recurrent TIA in setting of high risk soft plaque of left ICA bulb s/p L CEA placed on aspirin in addition to Eliquis.  Additional event on 12/24/2020 with dizziness and speech difficulty post fall that felt more consistent with presyncopal event versus TIA.  Vascular risk factors include A. fib/flutter, HTN, HLD, bradycardia s/p pacer, advanced age and EtOH use.      PLAN:  1. Recurrent TIAs:  a. Recent TIA vs near syncope event 12/24/2020 b. No additional events since that time c. Continue aspirin 81 mg daily and Eliquis (apixaban) daily  and atorvastatin 40 mg daily for secondary stroke prevention.  d. Discussed secondary stroke prevention measures and importance of close PCP follow up for aggressive stroke risk factor management  2. L ICA soft plaque: s/p CEA placed on aspirin.  Followed by [facility] with plans on carotid duplex in 9 months 3. A fib/flutter: On Eliquis 5 mg twice daily for CHA2DS2-VASc score 6.  Routinely followed by cardiology Dr. Lalla Brothers  4. HTN: BP goal <130/90.  Controlled manage/monitor by cardiology 5. HLD: LDL goal <70.  Well-controlled on atorvastatin 40 mg daily per PCP 6. At risk for sleep apnea: Did not discuss    F/u as previously scheduled   CC:  [facility] provider: Dr. Pearlean Brownie   I spent 40 minutes of face-to-face and non-face-to-face time with patient and wife.  This included previsit chart review  including hospitalization pertinent progress notes, lab work and imaging, lab review, study review, order entry, electronic health record documentation, patient education regarding recent event possible TIA versus presyncopal episode, A. fib and indication for [facility], s/p CEA and indication for surveillance monitoring with [facility], importance of managing stroke risk factors and answered all other questions to patient and wife's satisfaction  Ihor Austin, AGNP-BC  [facility] [address] [place], [place] [address]  Phone [phone] Fax [phone] Note: This document was prepared with digital dictation and possible smart phrase technology. Any transcriptional errors that result from this process are unintentional.

## 2021-01-11 NOTE — Progress Notes (Signed)
I agree with the above plan 

## 2021-02-11 DIAGNOSIS — I639 Cerebral infarction, unspecified: Secondary | ICD-10-CM

## 2021-02-11 HISTORY — DX: Cerebral infarction, unspecified: I63.9

## 2021-02-22 ENCOUNTER — Other Ambulatory Visit: Payer: Self-pay

## 2021-02-22 ENCOUNTER — Emergency Department (HOSPITAL_BASED_OUTPATIENT_CLINIC_OR_DEPARTMENT_OTHER)
Admission: EM | Admit: 2021-02-22 | Discharge: 2021-02-22 | Disposition: A | Discharge status: Home / Self Care | Payer: Medicare Other | Attending: Emergency Medicine | Admitting: Emergency Medicine

## 2021-02-22 DIAGNOSIS — Z7902 Long term (current) use of antithrombotics/antiplatelets: Secondary | ICD-10-CM | POA: Insufficient documentation

## 2021-02-22 DIAGNOSIS — S51012A Laceration without foreign body of left elbow, initial encounter: Secondary | ICD-10-CM | POA: Insufficient documentation

## 2021-02-22 DIAGNOSIS — Z7901 Long term (current) use of anticoagulants: Secondary | ICD-10-CM | POA: Diagnosis not present

## 2021-02-22 DIAGNOSIS — I10 Essential (primary) hypertension: Secondary | ICD-10-CM | POA: Diagnosis not present

## 2021-02-22 DIAGNOSIS — Z950 Presence of cardiac pacemaker: Secondary | ICD-10-CM | POA: Diagnosis not present

## 2021-02-22 DIAGNOSIS — Z87891 Personal history of nicotine dependence: Secondary | ICD-10-CM | POA: Diagnosis not present

## 2021-02-22 DIAGNOSIS — Z7982 Long term (current) use of aspirin: Secondary | ICD-10-CM | POA: Insufficient documentation

## 2021-02-22 DIAGNOSIS — W1830XA Fall on same level, unspecified, initial encounter: Secondary | ICD-10-CM | POA: Insufficient documentation

## 2021-02-22 DIAGNOSIS — S51011A Laceration without foreign body of right elbow, initial encounter: Secondary | ICD-10-CM | POA: Insufficient documentation

## 2021-02-22 DIAGNOSIS — Z79899 Other long term (current) drug therapy: Secondary | ICD-10-CM | POA: Insufficient documentation

## 2021-02-22 DIAGNOSIS — S59901A Unspecified injury of right elbow, initial encounter: Secondary | ICD-10-CM | POA: Diagnosis present

## 2021-02-22 DIAGNOSIS — W19XXXA Unspecified fall, initial encounter: Secondary | ICD-10-CM

## 2021-02-22 NOTE — ED Triage Notes (Signed)
Pt sent from PCP for laceration r/t a  fall. At approx 1230 hours Pt was knocked to the ground by an elevator door he was holding for a person. Pt fell sideways on to left shoulder. Pt did not hit head. Pt denies N/V. Pt is on Plavix.

## 2021-02-22 NOTE — ED Provider Notes (Signed)
[facility] EMERGENCY DEPT Provider Note   CSN: [id number] Arrival date & time: 02/22/21  1730     History Chief Complaint  Patient presents with  . Fall    [name] is a 80 y.o. male.  He had a mechanical fall while trying to hold the elevator open for somebody.  He landed on his elbows and left shoulder.  He says the shoulder does not hurt him now but his doctor was worried about the wounds on his elbows.  He is up-to-date on tetanus.  He takes Plavix.  There was no head strike.  No neck or back pain.  No numbness or weakness. The history is provided by the patient.  Fall This is a new problem. The current episode started 3 to 5 hours ago. The problem has not changed since onset.Pertinent negatives include no chest pain, no abdominal pain, no headaches and no shortness of breath. Nothing aggravates the symptoms. Nothing relieves the symptoms. He has tried nothing for the symptoms. The treatment provided no relief.       Past Medical History:  Diagnosis Date  . Carotid artery occlusion   . GERD (gastroesophageal reflux disease)   . Hypertension   . Stroke [facility])    TIA    Patient Active Problem List   Diagnosis Date Noted  . PAF (paroxysmal atrial fibrillation) (HCC) 12/24/2020  . 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 Essential hypertension 09/12/2020  . Anemia 09/12/2020  . Aphasia 09/12/2020  . TIA (transient ischemic attack) 09/12/2020    Past Surgical History:  Procedure Laterality Date  . CATARACT EXTRACTION, BILATERAL    . ENDARTERECTOMY Left 09/16/2020   Procedure: LEFT ENDARTERECTOMY CAROTID;  Surgeon: Maeola Harman, MD;  Location: [facility] OR;  Service: Vascular;  Laterality: Left;  . HERNIA REPAIR    . PACEMAKER IMPLANT    . TONSILLECTOMY         Family History  Family history unknown: Yes    Social History   Tobacco Use  . Smoking status: Former Games developer  . Smokeless tobacco: Never Used  Vaping Use  . Vaping  Use: Never used  Substance Use Topics  . Alcohol use: Yes    Comment: occassional    Home Medications Prior to Admission medications   Medication Sig Start Date End Date Taking? Authorizing Provider  albuterol (VENTOLIN HFA) 108 (90 Base) MCG/ACT inhaler Inhale 2 puffs into the lungs 4 (four) times daily as needed for wheezing or shortness of breath.   Yes [provider]  apixaban (ELIQUIS) 5 MG TABS tablet Take 1 tablet (5 mg total) by mouth 2 (two) times daily. 12/30/20  Yes Rolly Salter, MD  aspirin EC 81 MG tablet Take 1 tablet (81 mg total) by mouth daily. Swallow whole. 09/17/20 09/17/21 Yes Burnadette Pop, MD  atorvastatin (LIPITOR) 40 MG tablet Take 1 tablet (40 mg total) by mouth daily. 09/18/20  Yes Burnadette Pop, MD  cholecalciferol (VITAMIN D) 25 MCG (1000 UNIT) tablet Take 1,000 Units by mouth at bedtime.   Yes [provider]  Fluticasone Furoate (ARNUITY ELLIPTA) 200 MCG/ACT AEPB Inhale 1 puff into the lungs every morning.   Yes [provider]  Magnesium 250 MG TABS Take 250 mg by mouth every morning.   Yes [provider]  metoprolol tartrate (LOPRESSOR) 50 MG tablet Take 25 mg by mouth daily. Half tab of 50mg     Yes [provider]  Multiple Vitamin (MULTIVITAMIN WITH MINERALS) TABS tablet Take  1 tablet by mouth every morning.   Yes [provider]  Omega-3 Fatty Acids (FISH OIL PO) Take 1 capsule by mouth every morning.   Yes [provider]  omeprazole (PRILOSEC) 40 MG capsule Take 40 mg by mouth daily before supper.   Yes [provider]    Allergies    Patient has no known allergies.  Review of Systems   Review of Systems  Constitutional: Negative for fever.  HENT: Negative for trouble swallowing.   Eyes: Negative for visual disturbance.  Respiratory: Negative for shortness of breath.   Cardiovascular: Negative for chest pain.  Gastrointestinal: Negative for abdominal pain.   Genitourinary: Negative for dysuria.  Musculoskeletal: Negative for neck pain.  Skin: Positive for wound.  Neurological: Negative for headaches.    Physical Exam Updated Vital Signs BP (!) 142/72 (BP Location: Left Arm)   Pulse 63   Temp 98.1 F (36.7 C) (Oral)   Resp 18   Ht 5\' 10"  (1.778 m)   Wt 74.4 kg   SpO2 99%   BMI 23.53 kg/m   Physical Exam Vitals and nursing note reviewed.  Constitutional:      Appearance: Normal appearance. He is well-developed.  HENT:     Head: Normocephalic and atraumatic.  Eyes:     Conjunctiva/sclera: Conjunctivae normal.  Cardiovascular:     Rate and Rhythm: Normal rate and regular rhythm.     Pulses: Normal pulses.     Heart sounds: No murmur heard.   Pulmonary:     Effort: Pulmonary effort is normal. No respiratory distress.     Breath sounds: Normal breath sounds.  Abdominal:     Palpations: Abdomen is soft.     Tenderness: There is no abdominal tenderness.  Musculoskeletal:        General: Tenderness and signs of injury present. No deformity. Normal range of motion.     Cervical back: Neck supple.     Comments: Both of his elbows have skin tears with some tissue loss.  There is no suturable lacerations.  Skin:    General: Skin is warm and dry.  Neurological:     General: No focal deficit present.     Mental Status: He is alert.     ED Results / Procedures / Treatments   Labs (all labs ordered are listed, but only abnormal results are displayed) Labs Reviewed - No data to display  EKG None  Radiology No results found.  Procedures Procedures   Medications Ordered in ED Medications - No data to display  ED Course  I have reviewed the triage vital signs and the nursing notes.  Pertinent labs & imaging results that were available during my care of the patient were reviewed by me and considered in my medical decision making (see chart for details).  Clinical Course as of 02/23/21 0953  Tue Feb 22, 2021  1907  Wounds are skin tears and no indication for suture repair.  Reviewed with patient.  We will apply some antibiotics and nonstick dressings. [MB]    Clinical Course User Index [MB] Feb 24, 2021, MD   MDM Rules/Calculators/A&P                         Differential diagnosis includes skin tear, contusion, fracture, dislocation,  Final Clinical Impression(s) / ED Diagnoses Final diagnoses:  Skin tear of right elbow without complication, initial encounter  Skin tear of left elbow without complication, initial encounter  Fall,  initial encounter    Rx / DC Orders ED Discharge Orders    None       Terrilee Files, MD 02/23/21 [phone]

## 2021-02-22 NOTE — Discharge Instructions (Addendum)
You were seen in the emergency department for evaluation of injuries to both your elbows after a fall.  You had skin tears that would not benefit from suture repair.  You can use antibiotic ointment such as bacitracin and nonstick dressings.  Follow closely with your primary care doctor and return if any signs of infection.

## 2021-03-11 ENCOUNTER — Emergency Department (HOSPITAL_COMMUNITY): Payer: Medicare Other

## 2021-03-11 ENCOUNTER — Encounter (HOSPITAL_COMMUNITY): Payer: Self-pay | Admitting: Emergency Medicine

## 2021-03-11 ENCOUNTER — Other Ambulatory Visit: Payer: Self-pay

## 2021-03-11 ENCOUNTER — Inpatient Hospital Stay (HOSPITAL_COMMUNITY)
Admission: EM | Admit: 2021-03-11 | Discharge: 2021-03-13 | DRG: 315 | Disposition: A | Discharge status: Home / Self Care | Payer: Medicare Other | Attending: Student in an Organized Health Care Education/Training Program | Admitting: Student in an Organized Health Care Education/Training Program

## 2021-03-11 DIAGNOSIS — I9589 Other hypotension: Principal | ICD-10-CM | POA: Diagnosis present

## 2021-03-11 DIAGNOSIS — K219 Gastro-esophageal reflux disease without esophagitis: Secondary | ICD-10-CM | POA: Diagnosis present

## 2021-03-11 DIAGNOSIS — G459 Transient cerebral ischemic attack, unspecified: Secondary | ICD-10-CM | POA: Diagnosis not present

## 2021-03-11 DIAGNOSIS — Z7982 Long term (current) use of aspirin: Secondary | ICD-10-CM | POA: Diagnosis not present

## 2021-03-11 DIAGNOSIS — Z950 Presence of cardiac pacemaker: Secondary | ICD-10-CM | POA: Diagnosis not present

## 2021-03-11 DIAGNOSIS — I480 Paroxysmal atrial fibrillation: Secondary | ICD-10-CM | POA: Diagnosis present

## 2021-03-11 DIAGNOSIS — R4701 Aphasia: Secondary | ICD-10-CM | POA: Diagnosis present

## 2021-03-11 DIAGNOSIS — I10 Essential (primary) hypertension: Secondary | ICD-10-CM | POA: Diagnosis present

## 2021-03-11 DIAGNOSIS — Z20822 Contact with and (suspected) exposure to covid-19: Secondary | ICD-10-CM | POA: Diagnosis present

## 2021-03-11 DIAGNOSIS — N179 Acute kidney failure, unspecified: Secondary | ICD-10-CM | POA: Diagnosis present

## 2021-03-11 DIAGNOSIS — I129 Hypertensive chronic kidney disease with stage 1 through stage 4 chronic kidney disease, or unspecified chronic kidney disease: Secondary | ICD-10-CM | POA: Diagnosis present

## 2021-03-11 DIAGNOSIS — I4892 Unspecified atrial flutter: Secondary | ICD-10-CM | POA: Diagnosis present

## 2021-03-11 DIAGNOSIS — D6489 Other specified anemias: Secondary | ICD-10-CM | POA: Diagnosis present

## 2021-03-11 DIAGNOSIS — R001 Bradycardia, unspecified: Secondary | ICD-10-CM | POA: Diagnosis not present

## 2021-03-11 DIAGNOSIS — D509 Iron deficiency anemia, unspecified: Secondary | ICD-10-CM | POA: Diagnosis present

## 2021-03-11 DIAGNOSIS — Z87891 Personal history of nicotine dependence: Secondary | ICD-10-CM

## 2021-03-11 DIAGNOSIS — R29701 NIHSS score 1: Secondary | ICD-10-CM | POA: Diagnosis present

## 2021-03-11 DIAGNOSIS — Z7951 Long term (current) use of inhaled steroids: Secondary | ICD-10-CM | POA: Diagnosis not present

## 2021-03-11 DIAGNOSIS — R4182 Altered mental status, unspecified: Secondary | ICD-10-CM | POA: Diagnosis present

## 2021-03-11 DIAGNOSIS — Z79899 Other long term (current) drug therapy: Secondary | ICD-10-CM | POA: Diagnosis not present

## 2021-03-11 DIAGNOSIS — N182 Chronic kidney disease, stage 2 (mild): Secondary | ICD-10-CM | POA: Diagnosis present

## 2021-03-11 DIAGNOSIS — Z8673 Personal history of transient ischemic attack (TIA), and cerebral infarction without residual deficits: Secondary | ICD-10-CM

## 2021-03-11 DIAGNOSIS — R296 Repeated falls: Secondary | ICD-10-CM | POA: Diagnosis present

## 2021-03-11 DIAGNOSIS — K5900 Constipation, unspecified: Secondary | ICD-10-CM | POA: Diagnosis present

## 2021-03-11 DIAGNOSIS — E785 Hyperlipidemia, unspecified: Secondary | ICD-10-CM | POA: Diagnosis present

## 2021-03-11 DIAGNOSIS — Z7901 Long term (current) use of anticoagulants: Secondary | ICD-10-CM

## 2021-03-11 LAB — CBG MONITORING, ED: Glucose-Capillary: 129 mg/dL — ABNORMAL HIGH (ref 70–99)

## 2021-03-11 LAB — DIFFERENTIAL
Abs Immature Granulocytes: 0.01 10*3/uL (ref 0.00–0.07)
Basophils Absolute: 0 10*3/uL (ref 0.0–0.1)
Basophils Relative: 1 %
Eosinophils Absolute: 0 10*3/uL (ref 0.0–0.5)
Eosinophils Relative: 0 %
Immature Granulocytes: 0 %
Lymphocytes Relative: 34 %
Lymphs Abs: 1.7 10*3/uL (ref 0.7–4.0)
Monocytes Absolute: 0.4 10*3/uL (ref 0.1–1.0)
Monocytes Relative: 9 %
Neutro Abs: 2.8 10*3/uL (ref 1.7–7.7)
Neutrophils Relative %: 56 %

## 2021-03-11 LAB — URINALYSIS, ROUTINE W REFLEX MICROSCOPIC
Bilirubin Urine: NEGATIVE
Glucose, UA: NEGATIVE mg/dL
Hgb urine dipstick: NEGATIVE
Ketones, ur: NEGATIVE mg/dL
Leukocytes,Ua: NEGATIVE
Nitrite: NEGATIVE
Protein, ur: NEGATIVE mg/dL
Specific Gravity, Urine: 1.018 (ref 1.005–1.030)
pH: 6 (ref 5.0–8.0)

## 2021-03-11 LAB — CBC
HCT: 37.3 % — ABNORMAL LOW (ref 39.0–52.0)
Hemoglobin: 12.8 g/dL — ABNORMAL LOW (ref 13.0–17.0)
MCH: 33.5 pg (ref 26.0–34.0)
MCHC: 34.3 g/dL (ref 30.0–36.0)
MCV: 97.6 fL (ref 80.0–100.0)
Platelets: 166 10*3/uL (ref 150–400)
RBC: 3.82 MIL/uL — ABNORMAL LOW (ref 4.22–5.81)
RDW: 13.6 % (ref 11.5–15.5)
WBC: 4.9 10*3/uL (ref 4.0–10.5)
nRBC: 0 % (ref 0.0–0.2)

## 2021-03-11 LAB — I-STAT CHEM 8, ED
BUN: 26 mg/dL — ABNORMAL HIGH (ref 8–23)
Calcium, Ion: 1.09 mmol/L — ABNORMAL LOW (ref 1.15–1.40)
Chloride: 105 mmol/L (ref 98–111)
Creatinine, Ser: 1.6 mg/dL — ABNORMAL HIGH (ref 0.61–1.24)
Glucose, Bld: 141 mg/dL — ABNORMAL HIGH (ref 70–99)
HCT: 35 % — ABNORMAL LOW (ref 39.0–52.0)
Hemoglobin: 11.9 g/dL — ABNORMAL LOW (ref 13.0–17.0)
Potassium: 4.3 mmol/L (ref 3.5–5.1)
Sodium: 138 mmol/L (ref 135–145)
TCO2: 21 mmol/L — ABNORMAL LOW (ref 22–32)

## 2021-03-11 LAB — COMPREHENSIVE METABOLIC PANEL
ALT: 35 U/L (ref 0–44)
AST: 31 U/L (ref 15–41)
Albumin: 3.5 g/dL (ref 3.5–5.0)
Alkaline Phosphatase: 120 U/L (ref 38–126)
Anion gap: 10 (ref 5–15)
BUN: 26 mg/dL — ABNORMAL HIGH (ref 8–23)
CO2: 22 mmol/L (ref 22–32)
Calcium: 8.7 mg/dL — ABNORMAL LOW (ref 8.9–10.3)
Chloride: 104 mmol/L (ref 98–111)
Creatinine, Ser: 1.56 mg/dL — ABNORMAL HIGH (ref 0.61–1.24)
GFR, Estimated: 45 mL/min — ABNORMAL LOW (ref >60)
Glucose, Bld: 147 mg/dL — ABNORMAL HIGH (ref 70–99)
Potassium: 4.3 mmol/L (ref 3.5–5.1)
Sodium: 136 mmol/L (ref 135–145)
Total Bilirubin: 0.7 mg/dL (ref 0.3–1.2)
Total Protein: 6.2 g/dL — ABNORMAL LOW (ref 6.5–8.1)

## 2021-03-11 LAB — PROTIME-INR
INR: 1.2 (ref 0.8–1.2)
Prothrombin Time: 15.4 seconds — ABNORMAL HIGH (ref 11.4–15.2)

## 2021-03-11 LAB — RESP PANEL BY RT-PCR (FLU A&B, COVID) ARPGX2
Influenza A by PCR: NEGATIVE
Influenza B by PCR: NEGATIVE
SARS Coronavirus 2 by RT PCR: NEGATIVE

## 2021-03-11 LAB — APTT: aPTT: 39 seconds — ABNORMAL HIGH (ref 24–36)

## 2021-03-11 LAB — ETHANOL: Alcohol, Ethyl (B): <10 mg/dL (ref <10)

## 2021-03-11 MED ORDER — FERROUS SULFATE 325 (65 FE) MG PO TABS
325.0000 mg | ORAL_TABLET | Freq: Every day | ORAL | Status: DC
  Administered 2021-03-12: 325 mg via ORAL
  Filled 2021-03-11: qty 1

## 2021-03-11 MED ORDER — ACETAMINOPHEN 325 MG PO TABS: 650.0000 mg | ORAL_TABLET | ORAL | Status: DC | PRN

## 2021-03-11 MED ORDER — VITAMIN D3 25 MCG (1000 UNIT) PO TABS
1000.0000 [IU] | ORAL_TABLET | Freq: Every day | ORAL | Status: DC
  Administered 2021-03-12: 1000 [IU] via ORAL
  Filled 2021-03-11 (×4): qty 1

## 2021-03-11 MED ORDER — ALBUTEROL SULFATE HFA 108 (90 BASE) MCG/ACT IN AERS
2.0000 | INHALATION_SPRAY | Freq: Four times a day (QID) | RESPIRATORY_TRACT | Status: DC | PRN
  Filled 2021-03-11: qty 6.7

## 2021-03-11 MED ORDER — ATORVASTATIN CALCIUM 40 MG PO TABS
40.0000 mg | ORAL_TABLET | Freq: Every day | ORAL | Status: DC
  Administered 2021-03-12 – 2021-03-13 (×2): 40 mg via ORAL
  Filled 2021-03-11 (×2): qty 1

## 2021-03-11 MED ORDER — ACETAMINOPHEN 160 MG/5ML PO SOLN: 650.0000 mg | ORAL | Status: DC | PRN

## 2021-03-11 MED ORDER — ACETAMINOPHEN 650 MG RE SUPP: 650.0000 mg | RECTAL | Status: DC | PRN

## 2021-03-11 MED ORDER — SODIUM CHLORIDE 0.9 % IV BOLUS
500.0000 mL | Freq: Once | INTRAVENOUS | Status: AC
  Administered 2021-03-11: 500 mL via INTRAVENOUS

## 2021-03-11 MED ORDER — IOHEXOL 350 MG/ML SOLN
60.0000 mL | Freq: Once | INTRAVENOUS | Status: AC | PRN
  Administered 2021-03-11: 60 mL via INTRAVENOUS

## 2021-03-11 MED ORDER — ASPIRIN EC 81 MG PO TBEC
81.0000 mg | DELAYED_RELEASE_TABLET | Freq: Every day | ORAL | Status: DC
  Administered 2021-03-12 – 2021-03-13 (×2): 81 mg via ORAL
  Filled 2021-03-11 (×2): qty 1

## 2021-03-11 MED ORDER — SODIUM CHLORIDE 0.9 % IV BOLUS: 500.0000 mL | Freq: Once | INTRAVENOUS | Status: DC

## 2021-03-11 MED ORDER — STROKE: EARLY STAGES OF RECOVERY BOOK
Freq: Once | Status: AC
  Filled 2021-03-11: qty 1

## 2021-03-11 MED ORDER — ADULT MULTIVITAMIN W/MINERALS CH
1.0000 | ORAL_TABLET | Freq: Every morning | ORAL | Status: DC
  Administered 2021-03-12 – 2021-03-13 (×2): 1 via ORAL
  Filled 2021-03-11 (×2): qty 1

## 2021-03-11 MED ORDER — SENNOSIDES-DOCUSATE SODIUM 8.6-50 MG PO TABS: 1.0000 | ORAL_TABLET | Freq: Every evening | ORAL | Status: DC | PRN

## 2021-03-11 MED ORDER — APIXABAN 5 MG PO TABS
5.0000 mg | ORAL_TABLET | Freq: Two times a day (BID) | ORAL | Status: DC
  Administered 2021-03-11 – 2021-03-13 (×4): 5 mg via ORAL
  Filled 2021-03-11 (×4): qty 1

## 2021-03-11 MED ORDER — PANTOPRAZOLE SODIUM 40 MG PO TBEC
40.0000 mg | DELAYED_RELEASE_TABLET | Freq: Every day | ORAL | Status: DC
  Administered 2021-03-12 – 2021-03-13 (×2): 40 mg via ORAL
  Filled 2021-03-11 (×2): qty 1

## 2021-03-11 NOTE — ED Triage Notes (Signed)
Pt BIB [facility] for Code Stroke. Pt endorses sudden onset aphasia which had mostly resolved on patients arrival to ED. No other neurological symptoms. Pt takes eliquis. Pt a/ox4, appears in NAD. LKW 1515.

## 2021-03-11 NOTE — Hospital Course (Addendum)
Admitted 03/11/2021  Allergies: Patient has no known allergies. Pertinent Hx: Proximal AF on Eliquis, HTN and Pacemaker, recurrent TIAs, L carotid artery s/p left carotid end arterectomy 09/2020,   80 y.o. male p/w Expressive aphasia  * TIA vs cerebral hypoperfusion. His BP at home was lower than bl today. Hx of recurrent TIA and s/p L carotid endarterectomy 09/2020. Neuro on board. Can't get MRI b/c of pace make. Plan is to repeat CT head in 24h.   *AKI: Cr 1.5 from 1. S/p NS 500 ml. Holding Lisinopril. *Paroxismal Afib: now sinus HR 60. Hold Metop tonight to prevent hypotension and will resume tomorrow or sooner if needed.  Consults: Neuro Meds: Eliquis, ASA, albuterol, lipitor, VTE ppx: Eliquis IVF: NS 500 ml Diet: npo until passes swallow eval  Amil Amen*  []  Repeat CT head in 24 h  O/N events:

## 2021-03-11 NOTE — Progress Notes (Signed)
Patient has an [employer] model 2240 Pacer that is not MR safe. We are unable to proceed with any MRI scans for pt. Ordering has been informed as well as Dr. Particia Nearing.

## 2021-03-11 NOTE — ED Notes (Signed)
Report given to rn on 3  w 

## 2021-03-11 NOTE — Code Documentation (Signed)
Pt is a 80 yr old male with history of carotid artery disease, AF on Eliquis, HTN and Pacemaker. He was in his usual state of health when he became "confused" per wife at 65. He has known Lt carotid artery disease, and had an endarterectomy in Nov of 2021. Pt arrived to [facility] at 1713. Per EMS, his speech was altered when they arrived at his residence, but had cleared up by the time he arrived at [facility]. (see flowsheet for full NIHSS and timeline). Pt taken to CT at 1716. CTNC done, and negative for hemorrhage per neurologist.When creatinine resulted, a CTA was also done. Pt was then returned to room 15 to complete work-up. Per wife, pt is back to his baseline. We scored him a 2 for saying month was May,and having a slight loss of fluency in speech. Pt not given TPA as is taking Eliquis. Pt not candidate for endovascular therapy as he is LVO negative. He will need q 2 hr VS and mNIHSS for 12 hr, then q 4 hr. Bedside handoff with [facility] RN done.

## 2021-03-11 NOTE — ED Provider Notes (Signed)
[facility] EMERGENCY DEPARTMENT Provider Note   CSN: [id number] Arrival date & time: 03/11/21  1714  An emergency department physician performed an initial assessment on this suspected stroke patient at 1715.  History Chief Complaint  Patient presents with  . Code Stroke    [name] is a 80 y.o. male.  Pt presents to the ED today as a code stroke.  At 1515 today, pt had a sudden onset of aphasia.  He knew what he wanted to say, but could not say the right word.  The pt is back to normal now.  Pt denied any weakness or vision changes.  Pt is on Eliquis.  Last dose this am.  Pt does have a hx of afib and TIA.  He was admitted in Feb for a TIA.  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        Past Medical History:  Diagnosis Date  . Carotid artery occlusion   . GERD (gastroesophageal reflux disease)   . Hypertension   . Stroke [facility])    TIA    Patient Active Problem List   Diagnosis Date Noted  . PAF (paroxysmal atrial fibrillation) ([place]) 12/24/2020  . 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 Essential hypertension 09/12/2020  . Anemia 09/12/2020  . Aphasia 09/12/2020  . TIA (transient ischemic attack) 09/12/2020    Past Surgical History:  Procedure Laterality Date  . CATARACT EXTRACTION, BILATERAL    . ENDARTERECTOMY Left 09/16/2020   Procedure: LEFT ENDARTERECTOMY CAROTID;  Surgeon: Waynetta Sandy, MD;  Location: [place];  Service: Vascular;  Laterality: Left;  . HERNIA REPAIR    . PACEMAKER IMPLANT    . TONSILLECTOMY         Family History  Family history unknown: Yes    Social History   Tobacco Use  . Smoking status: Former Research scientist (life sciences)  . Smokeless tobacco: Never Used  Vaping Use  . Vaping Use: Never used  Substance Use Topics  . Alcohol use: Yes    Comment: occassional    Home Medications Prior to Admission medications   Medication Sig Start Date End Date Taking? Authorizing  Provider  albuterol (VENTOLIN HFA) 108 (90 Base) MCG/ACT inhaler Inhale 2 puffs into the lungs 4 (four) times daily as needed for wheezing or shortness of breath.   Yes [provider]  apixaban (ELIQUIS) 5 MG TABS tablet Take 1 tablet (5 mg total) by mouth 2 (two) times daily. 12/30/20  Yes Lavina Hamman, MD  aspirin EC 81 MG tablet Take 1 tablet (81 mg total) by mouth daily. Swallow whole. 09/17/20 09/17/21 Yes Shelly Coss, MD  atorvastatin (LIPITOR) 40 MG tablet Take 1 tablet (40 mg total) by mouth daily. 09/18/20  Yes Shelly Coss, MD  cholecalciferol (VITAMIN D) 25 MCG (1000 UNIT) tablet Take 1,000 Units by mouth at bedtime.   Yes [provider]  Ferrous Sulfate (IRON) 325 (65 Fe) MG TABS Take 1 tablet by mouth at bedtime.   Yes [provider]  Fluticasone Furoate (ARNUITY ELLIPTA) 200 MCG/ACT AEPB Inhale 1 puff into the lungs every morning.   Yes [provider]  lisinopril (ZESTRIL) 2.5 MG tablet Take 2.5 mg by mouth daily. 03/04/21  Yes [provider]  Magnesium 250 MG TABS Take 250 mg by mouth every morning.   Yes [provider]  metoprolol tartrate (LOPRESSOR) 50 MG tablet Take 25 mg by mouth 2 (two) times daily. Half  tab of $Remo'50mg'PixDw$    Yes [provider]  Multiple Vitamin (MULTIVITAMIN WITH MINERALS) TABS tablet Take 1 tablet by mouth every morning.   Yes [provider]  Omega-3 Fatty Acids (FISH OIL PO) Take 1 capsule by mouth every morning.   Yes [provider]  omeprazole (PRILOSEC) 40 MG capsule Take 40 mg by mouth daily before supper.   Yes [provider]    Allergies    Patient has no known allergies.  Review of Systems   Review of Systems  Neurological: Positive for speech difficulty.  All other systems reviewed and are negative.   Physical Exam Updated Vital Signs BP (!) 163/62   Pulse (!) 56   Temp 98.2 F (36.8 C) (Oral)   Resp 18   Ht $R'5\' 10"'ya$  (1.778 m)   Wt 72.6 kg    SpO2 100%   BMI 22.96 kg/m   Physical Exam Vitals and nursing note reviewed.  Constitutional:      Appearance: Normal appearance.  HENT:     Head: Normocephalic and atraumatic.     Right Ear: External ear normal.     Left Ear: External ear normal.     Nose: Nose normal.     Mouth/Throat:     Mouth: Mucous membranes are moist.     Pharynx: Oropharynx is clear.  Eyes:     Extraocular Movements: Extraocular movements intact.     Conjunctiva/sclera: Conjunctivae normal.     Pupils: Pupils are equal, round, and reactive to light.  Cardiovascular:     Rate and Rhythm: Normal rate and regular rhythm.     Pulses: Normal pulses.     Heart sounds: Normal heart sounds.  Pulmonary:     Effort: Pulmonary effort is normal.     Breath sounds: Normal breath sounds.  Abdominal:     General: Abdomen is flat. Bowel sounds are normal.     Palpations: Abdomen is soft.  Musculoskeletal:        General: Normal range of motion.     Cervical back: Normal range of motion and neck supple.  Skin:    General: Skin is warm.     Capillary Refill: Capillary refill takes less than 2 seconds.  Neurological:     General: No focal deficit present.     Mental Status: He is alert and oriented to person, place, and time.  Psychiatric:        Mood and Affect: Mood normal.        Behavior: Behavior normal.        Thought Content: Thought content normal.        Judgment: Judgment normal.     ED Results / Procedures / Treatments   Labs (all labs ordered are listed, but only abnormal results are displayed) Labs Reviewed  PROTIME-INR - Abnormal; Notable for the following components:      Result Value   Prothrombin Time 15.4 (*)    All other components within normal limits  APTT - Abnormal; Notable for the following components:   aPTT 39 (*)    All other components within normal limits  CBC - Abnormal; Notable for the following components:   RBC 3.82 (*)    Hemoglobin 12.8 (*)    HCT 37.3 (*)    All  other components within normal limits  COMPREHENSIVE METABOLIC PANEL - Abnormal; Notable for the following components:   Glucose, Bld 147 (*)    BUN 26 (*)    Creatinine, Ser 1.56 (*)  Calcium 8.7 (*)    Total Protein 6.2 (*)    GFR, Estimated 45 (*)    All other components within normal limits  I-STAT CHEM 8, ED - Abnormal; Notable for the following components:   BUN 26 (*)    Creatinine, Ser 1.60 (*)    Glucose, Bld 141 (*)    Calcium, Ion 1.09 (*)    TCO2 21 (*)    Hemoglobin 11.9 (*)    HCT 35.0 (*)    All other components within normal limits  CBG MONITORING, ED - Abnormal; Notable for the following components:   Glucose-Capillary 129 (*)    All other components within normal limits  RESP PANEL BY RT-PCR (FLU A&B, COVID) ARPGX2  ETHANOL  DIFFERENTIAL  RAPID URINE DRUG SCREEN, HOSP PERFORMED  URINALYSIS, ROUTINE W REFLEX MICROSCOPIC  LIPID PANEL    EKG EKG Interpretation  Date/Time:  Friday March 11 2021 17:44:11 EDT Ventricular Rate:  62 PR Interval:  144 QRS Duration: 107 QT Interval:  416 QTC Calculation: 423 R Axis:   24 Text Interpretation: Sinus rhythm Abnormal R-wave progression, early transition No significant change since last tracing Confirmed by Isla Pence [phone]) on 03/11/2021 5:49:25 PM   Radiology CT HEAD CODE STROKE WO CONTRAST  Result Date: 03/11/2021 CLINICAL DATA:  Code stroke. Acute neurological deficit. Stroke suspected. EXAM: CT HEAD WITHOUT CONTRAST TECHNIQUE: Contiguous axial images were obtained from the base of the skull through the vertex without intravenous contrast. COMPARISON:  12/24/2020 FINDINGS: Brain: Generalized atrophy. Chronic small-vessel ischemic changes affect pons. No focal cerebellar insult. Cerebral hemispheres show chronic small-vessel ischemic changes of the white matter. No sign of old or acute cortical or subcortical infarction. No mass lesion, hemorrhage, hydrocephalus or extra-axial collection. Vascular: There is  atherosclerotic calcification of the major vessels at the base of the brain. Skull: Negative Sinuses/Orbits: Clear/normal Other: None ASPECTS ([place] Stroke Program Early CT Score). Clinical insult side not described. However, the calculation would be the same. - Ganglionic level infarction (caudate, lentiform nuclei, internal capsule, insula, M1-M3 cortex): 7 - Supraganglionic infarction (M4-M6 cortex): 3 Total score (0-10 with 10 being normal): 10 IMPRESSION: 1. No acute finding by CT. Atrophy and chronic small-vessel ischemic changes. 2. ASPECTS is 10. Clinical insult side not described. However, the calculation would be the same. 3. These results were communicated to Dr. Quinn Axe At 5:28 pmon 4/29/2022by text page via the [facility] messaging system. Electronically Signed   By: Nelson Chimes M.D.   On: 03/11/2021 17:30   CT ANGIO HEAD CODE STROKE  Result Date: 03/11/2021 CLINICAL DATA:  Neuro deficit, acute, stroke suspected. EXAM: CT ANGIOGRAPHY HEAD AND NECK TECHNIQUE: Multidetector CT imaging of the head and neck was performed using the standard protocol during bolus administration of intravenous contrast. Multiplanar CT image reconstructions and MIPs were obtained to evaluate the vascular anatomy. Carotid stenosis measurements (when applicable) are obtained utilizing NASCET criteria, using the distal internal carotid diameter as the denominator. CONTRAST:  92mL OMNIPAQUE IOHEXOL 350 MG/ML SOLN COMPARISON:  Head CT March 11, 2021. FINDINGS: CTA NECK FINDINGS Aortic arch: Common origin of the innominate and left common carotid artery from the aortic arch. Imaged portion shows no evidence of aneurysm or dissection. Calcified plaques in the aortic arch and at the origin of the left subclavian artery. No significant stenosis of the major arch vessel origins. Right carotid system: Calcified plaque in the right carotid bifurcation without hemodynamically significant stenosis. Left carotid system: Status post end  arterectomy. No evidence of  stenosis, dissection or occlusion. Vertebral arteries: Left dominant. No evidence of dissection, stenosis (50% or greater) or occlusion. Skeleton: Degenerative changes of the cervical spine. No aggressive bone lesion identified Other neck: Enlarged and heterogeneous right thyroid Upper chest: Negative. Review of the MIP images confirms the above findings CTA HEAD FINDINGS Anterior circulation: Calcified plaques in the bilateral carotid siphons. No significant stenosis, proximal occlusion, aneurysm, or vascular malformation. Posterior circulation: Calcified plaque in the V4 segment of the left vertebral artery. No significant stenosis, proximal occlusion, aneurysm, or vascular malformation. Venous sinuses: As permitted by contrast timing, patent. Anatomic variants: None Review of the MIP images confirms the above findings IMPRESSION: 1. No large vessel occlusion, hemodynamically significant stenosis, or evidence of dissection. 2. Status post left carotid end arterectomy. 3. Calcified plaque in the right carotid bifurcation without hemodynamically significant stenosis. 4. Enlarged and heterogeneous right thyroid. 5. Aortic atherosclerosis. Aortic Atherosclerosis (ICD10-I70.0). Electronically Signed   By: Pedro Earls M.D.   On: 03/11/2021 17:56   CT ANGIO NECK CODE STROKE  Result Date: 03/11/2021 CLINICAL DATA:  Neuro deficit, acute, stroke suspected. EXAM: CT ANGIOGRAPHY HEAD AND NECK TECHNIQUE: Multidetector CT imaging of the head and neck was performed using the standard protocol during bolus administration of intravenous contrast. Multiplanar CT image reconstructions and MIPs were obtained to evaluate the vascular anatomy. Carotid stenosis measurements (when applicable) are obtained utilizing NASCET criteria, using the distal internal carotid diameter as the denominator. CONTRAST:  16m OMNIPAQUE IOHEXOL 350 MG/ML SOLN COMPARISON:  Head CT March 11, 2021. FINDINGS:  CTA NECK FINDINGS Aortic arch: Common origin of the innominate and left common carotid artery from the aortic arch. Imaged portion shows no evidence of aneurysm or dissection. Calcified plaques in the aortic arch and at the origin of the left subclavian artery. No significant stenosis of the major arch vessel origins. Right carotid system: Calcified plaque in the right carotid bifurcation without hemodynamically significant stenosis. Left carotid system: Status post end arterectomy. No evidence of stenosis, dissection or occlusion. Vertebral arteries: Left dominant. No evidence of dissection, stenosis (50% or greater) or occlusion. Skeleton: Degenerative changes of the cervical spine. No aggressive bone lesion identified Other neck: Enlarged and heterogeneous right thyroid Upper chest: Negative. Review of the MIP images confirms the above findings CTA HEAD FINDINGS Anterior circulation: Calcified plaques in the bilateral carotid siphons. No significant stenosis, proximal occlusion, aneurysm, or vascular malformation. Posterior circulation: Calcified plaque in the V4 segment of the left vertebral artery. No significant stenosis, proximal occlusion, aneurysm, or vascular malformation. Venous sinuses: As permitted by contrast timing, patent. Anatomic variants: None Review of the MIP images confirms the above findings IMPRESSION: 1. No large vessel occlusion, hemodynamically significant stenosis, or evidence of dissection. 2. Status post left carotid end arterectomy. 3. Calcified plaque in the right carotid bifurcation without hemodynamically significant stenosis. 4. Enlarged and heterogeneous right thyroid. 5. Aortic atherosclerosis. Aortic Atherosclerosis (ICD10-I70.0). Electronically Signed   By: KPedro EarlsM.D.   On: 03/11/2021 17:56    Procedures Procedures   Medications Ordered in ED Medications  apixaban (ELIQUIS) tablet 5 mg (has no administration in time range)  aspirin EC tablet 81  mg (has no administration in time range)   stroke: mapping our early stages of recovery book (has no administration in time range)  acetaminophen (TYLENOL) tablet 650 mg (has no administration in time range)    Or  acetaminophen (TYLENOL) 160 MG/5ML solution 650 mg (has no administration in time range)  Or  acetaminophen (TYLENOL) suppository 650 mg (has no administration in time range)  senna-docusate (Senokot-S) tablet 1 tablet (has no administration in time range)  albuterol (VENTOLIN HFA) 108 (90 Base) MCG/ACT inhaler 2 puff (has no administration in time range)  atorvastatin (LIPITOR) tablet 40 mg (has no administration in time range)  cholecalciferol (VITAMIN D) tablet 1,000 Units (has no administration in time range)  ferrous sulfate tablet 325 mg (has no administration in time range)  multivitamin with minerals tablet 1 tablet (has no administration in time range)  pantoprazole (PROTONIX) EC tablet 40 mg (has no administration in time range)  iohexol (OMNIPAQUE) 350 MG/ML injection 60 mL (60 mLs Intravenous Contrast Given 03/11/21 1741)  sodium chloride 0.9 % bolus 500 mL (0 mLs Intravenous Stopped 03/11/21 1931)    ED Course  I have reviewed the triage vital signs and the nursing notes.  Pertinent labs & imaging results that were available during my care of the patient were reviewed by me and considered in my medical decision making (see chart for details).    MDM Rules/Calculators/A&P                         Pt met at the bridge by myself and the stroke team.  Pt taken directly to the CT scanner.  Neurology recommends admission for stroke work up.  Pt has a pacemaker that is MRI unsafe, so he is unable to get a MRI.  Pt's bp is also slightly low, so he is given IVFs.  This has helped his bp.  Pt d/w IMTS for admission.  CRITICAL CARE Performed by: Isla Pence   Total critical care time: 30 minutes  Critical care time was exclusive of separately billable procedures  and treating other patients.  Critical care was necessary to treat or prevent imminent or life-threatening deterioration.  Critical care was time spent personally by me on the following activities: development of treatment plan with patient and/or surrogate as well as nursing, discussions with consultants, evaluation of patient's response to treatment, examination of patient, obtaining history from patient or surrogate, ordering and performing treatments and interventions, ordering and review of laboratory studies, ordering and review of radiographic studies, pulse oximetry and re-evaluation of patient's condition.  Final Clinical Impression(s) / ED Diagnoses Final diagnoses:  TIA (transient ischemic attack)    Rx / DC Orders ED Discharge Orders    None       Isla Pence, MD 03/11/21 2132

## 2021-03-11 NOTE — H&P (Addendum)
Date: 03/11/2021               Patient Name:  [name] MRN: [id number]  DOB: Feb 28, 1941 Age / Sex: 80 y.o., male   PCP: Patient, No Pcp Per (Inactive)         Medical Service: Internal Medicine Teaching Service         Attending Physician: Dr. Inez Catalina, MD    First Contact: Alphonzo Severance, MD Pager: Myriam Jacobson [phone]  Second Contact: Eliezer Bottom, MD Pager: Janene Harvey [phone]       After Hours (After 5p/  First Contact Pager: [phone]  weekends / holidays): Second Contact Pager: [phone]   SUBJECTIVE   Chief Complaint: Difficulty finding words, dizziness  History of Present Illness:  [name] is a 80 year old gentleman with a past medical history of recurrent episodes of aphasia status post left carotid endarterectomy 10/2020 on aspirin, device detected a flutter on Eliquis, bradycardia status post pacemaker placement 2016, hypertension, GERD, recent falls presented to the ED after being brought in by EMS for difficulty finding words and dizziness.  Patient states he has been in his usual state of health today when he was eating lunch, drinking a glass of wine and noticed that he was mispronouncing words.  EMS were at his assisted living facility assisting to the patient and they came over and evaluated him.  Additional EMS personnel arrived on scene found patient's initial systolic blood pressure in the 80s, upon recheck it was 117 systolically.  States his symptoms resolved once arriving to the ED. he took all of his medications this morning.  Recently prescribed additional BP medication, uncertain of the name and he has yet to start taking it. Notes feeling asymptomatic at this time.    He denies headache, lightheadedness, syncopal episode, changes in vision, changes in smell or taste, difficulty swallowing, denies any loss of sensation, denies any motor weakness.  Also denies chest pain, shortness of breath, palpitations, abdominal pain, nausea, vomiting, diarrhea.  Does note constipation for  the past few days in which she took Colace that helped resolve this.  Denies any dark or bloody stools.  Denies any difficulty with urination.  He does note a history of recurrent falls that are due to his " tripping and losing balance" he does use a walker or cane during the evenings and at times when he walks long distances.  States he has been evaluated by PT and OT for his prior TIAs and they have not recommended using a walker or cane full-time.  Upon chart review patient was admitted for episodes of aphasia 09/2020, at that time he was without high-grade stenosis of his carotids, but he did have an irregular plaque in the left ICA.  At this time he was discharged home with follow-up.  15 minutes after being discharged once the patient arrived at home, he had additional episodes of aphasia.  They called EMS and patient returned to the hospital.  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  Patient was taken to surgery and had a carotid endarterectomy.  12/2020 patient presented to the ED again with additional word finding difficulties after having a fall.  Per documentation uncertain if patient had syncopal episode as he did not remember what occurred.  He was having issues at this time with lightheadedness dizziness as well as difficulty speaking.  CTA was consistent with left carotid endarterectomy with potential flap.  Throughout his evaluations,  he is unable to get an MRI brain because of incompatibility of his pacemaker.  ED Course:  Patient's symptoms had resolved upon arrival to the ED, immediately evaluated by stroke team and ED provider.  CT imaging without acute intracranial abnormalities.  Unable to perform MRI due to pacemaker being incompatible.  Thought to be TIA versus cerebral hypoperfusion due to hypotension.  Admitted for further stroke evaluation. Meds:  Current Meds  Medication Sig  . albuterol (VENTOLIN HFA) 108 (90  Base) MCG/ACT inhaler Inhale 2 puffs into the lungs 4 (four) times daily as needed for wheezing or shortness of breath.  Marland Kitchen apixaban (ELIQUIS) 5 MG TABS tablet Take 1 tablet (5 mg total) by mouth 2 (two) times daily.  Marland Kitchen aspirin EC 81 MG tablet Take 1 tablet (81 mg total) by mouth daily. Swallow whole.  Marland Kitchen atorvastatin (LIPITOR) 40 MG tablet Take 1 tablet (40 mg total) by mouth daily.  . cholecalciferol (VITAMIN D) 25 MCG (1000 UNIT) tablet Take 1,000 Units by mouth at bedtime.  . Ferrous Sulfate (IRON) 325 (65 Fe) MG TABS Take 1 tablet by mouth at bedtime.  . Fluticasone Furoate (ARNUITY ELLIPTA) 200 MCG/ACT AEPB Inhale 1 puff into the lungs every morning.  Marland Kitchen lisinopril (ZESTRIL) 2.5 MG tablet Take 2.5 mg by mouth daily.  . Magnesium 250 MG TABS Take 250 mg by mouth every morning.  . metoprolol tartrate (LOPRESSOR) 50 MG tablet Take 25 mg by mouth 2 (two) times daily. Half tab of 50mg   . Multiple Vitamin (MULTIVITAMIN WITH MINERALS) TABS tablet Take 1 tablet by mouth every morning.  . Omega-3 Fatty Acids (FISH OIL PO) Take 1 capsule by mouth every morning.  omeprazole (PRILOSEC) 40 MG capsule Take 40 mg by mouth daily before supper.    Past Medical History:  Diagnosis Date  . Carotid artery occlusion   . GERD (gastroesophageal reflux disease)   . Hypertension   . Stroke [facility])    TIA    Past Surgical History:  Procedure Laterality Date  . CATARACT EXTRACTION, BILATERAL    . ENDARTERECTOMY Left 09/16/2020   Procedure: LEFT ENDARTERECTOMY CAROTID;  Surgeon: 13/02/2020, MD;  Location: [facility] OR;  Service: Vascular;  Laterality: Left;  . HERNIA REPAIR    . PACEMAKER IMPLANT    . TONSILLECTOMY      Social:  Lives With: Wife at assisted living facility Occupation: Retired Support: Good support system at home with wife Level of Function: Able to perform all his ADLs, at times does need walker or cane PCP: [facility] healthcare [place] Substances: History of tobacco use  half pack per week for 15 years, occasional alcohol use once a week of 1 glass of wine, denies illicit substances or taking medications and not prescribed  Allergies: Allergies as of 03/11/2021  . (No Known Allergies)    Review of Systems: A complete ROS was negative except as per HPI.   OBJECTIVE:   Physical Exam: Blood pressure 131/66, pulse 63, temperature 98.2 F (36.8 C), temperature source Oral, resp. rate 13, height 5\' 10"  (1.778 m), weight 72.6 kg, SpO2 99 %.  Constitutional: Well-appearing male, resting bed comfortably.  No acute distress HENT: normocephalic atraumatic, mucous membranes moist Eyes: conjunctiva non-erythematous Neck: supple Cardiovascular: regular rate and rhythm, no m/r/g Pulmonary/Chest: normal work of breathing on room air, lungs clear to auscultation bilaterally Abdominal: soft, non-tender, non-distended MSK: normal bulk and tone.  Bandage to left elbow. Neurological: alert & oriented x 3, no facial asymmetry,  extraocular motions intact, pupils equal and reactive to light, no uvular deviation or tongue fasciculations.  Normal finger-nose.  No pronator drift.  Upper and lower extremities 5 out of 5 strength bilaterally.  Normal sensation throughout.  Normal heel-to-shin.  No difficulty finding words my exam. Skin: warm and dry Psych: Normal mood and thought process   Labs: CBC    Component Value Date/Time   WBC 4.9 03/11/2021 1717   RBC 3.82 (L) 03/11/2021 1717   HGB 11.9 (L) 03/11/2021 1723   HCT 35.0 (L) 03/11/2021 1723   PLT 166 03/11/2021 1717   MCV 97.6 03/11/2021 1717   MCH 33.5 03/11/2021 1717   MCHC 34.3 03/11/2021 1717   RDW 13.6 03/11/2021 1717   LYMPHSABS 1.7 03/11/2021 1717   MONOABS 0.4 03/11/2021 1717   EOSABS 0.0 03/11/2021 1717   BASOSABS 0.0 03/11/2021 1717     CMP     Component Value Date/Time   NA 138 03/11/2021 1723   K 4.3 03/11/2021 1723   CL 105 03/11/2021 1723   CO2 22 03/11/2021 1717   GLUCOSE 141 (H) 03/11/2021  1723   BUN 26 (H) 03/11/2021 1723   CREATININE 1.60 (H) 03/11/2021 1723   CALCIUM 8.7 (L) 03/11/2021 1717   PROT 6.2 (L) 03/11/2021 1717   ALBUMIN 3.5 03/11/2021 1717   AST 31 03/11/2021 1717   ALT 35 03/11/2021 1717   ALKPHOS 120 03/11/2021 1717   BILITOT 0.7 03/11/2021 1717   GFRNONAA 45 (L) 03/11/2021 1717    Imaging: CT HEAD CODE STROKE WO CONTRAST  Result Date: 03/11/2021 CLINICAL DATA:  Code stroke. Acute neurological deficit. Stroke suspected. EXAM: CT HEAD WITHOUT CONTRAST TECHNIQUE: Contiguous axial images were obtained from the base of the skull through the vertex without intravenous contrast. COMPARISON:  12/24/2020 FINDINGS: Brain: Generalized atrophy. Chronic small-vessel ischemic changes affect pons. No focal cerebellar insult. Cerebral hemispheres show chronic small-vessel ischemic changes of the white matter. No sign of old or acute cortical or subcortical infarction. No mass lesion, hemorrhage, hydrocephalus or extra-axial collection. Vascular: There is atherosclerotic calcification of the major vessels at the base of the brain. Skull: Negative Sinuses/Orbits: Clear/normal Other: None ASPECTS (Alberta Stroke Program Early CT Score). Clinical insult side not described. However, the calculation would be the same. - Ganglionic level infarction (caudate, lentiform nuclei, internal capsule, insula, M1-M3 cortex): 7 - Supraganglionic infarction (M4-M6 cortex): 3 Total score (0-10 with 10 being normal): 10 IMPRESSION: 1. No acute finding by CT. Atrophy and chronic small-vessel ischemic changes. 2. ASPECTS is 10. Clinical insult side not described. However, the calculation would be the same. 3. These results were communicated to Dr. Selina CooleyStack At 5:28 pmon 4/29/2022by text page via the [facility] messaging system. Electronically Signed   By: Paulina FusiMark  Shogry M.D.   On: 03/11/2021 17:30   CT ANGIO HEAD CODE STROKE  Result Date: 03/11/2021 CLINICAL DATA:  Neuro deficit, acute, stroke suspected. EXAM: CT  ANGIOGRAPHY HEAD AND NECK TECHNIQUE: Multidetector CT imaging of the head and neck was performed using the standard protocol during bolus administration of intravenous contrast. Multiplanar CT image reconstructions and MIPs were obtained to evaluate the vascular anatomy. Carotid stenosis measurements (when applicable) are obtained utilizing NASCET criteria, using the distal internal carotid diameter as the denominator. CONTRAST:  60mL OMNIPAQUE IOHEXOL 350 MG/ML SOLN COMPARISON:  Head CT March 11, 2021. FINDINGS: CTA NECK FINDINGS Aortic arch: Common origin of the innominate and left common carotid artery from the aortic arch. Imaged portion shows no evidence of aneurysm  or dissection. Calcified plaques in the aortic arch and at the origin of the left subclavian artery. No significant stenosis of the major arch vessel origins. Right carotid system: Calcified plaque in the right carotid bifurcation without hemodynamically significant stenosis. Left carotid system: Status post end arterectomy. No evidence of stenosis, dissection or occlusion. Vertebral arteries: Left dominant. No evidence of dissection, stenosis (50% or greater) or occlusion. Skeleton: Degenerative changes of the cervical spine. No aggressive bone lesion identified Other neck: Enlarged and heterogeneous right thyroid Upper chest: Negative. Review of the MIP images confirms the above findings CTA HEAD FINDINGS Anterior circulation: Calcified plaques in the bilateral carotid siphons. No significant stenosis, proximal occlusion, aneurysm, or vascular malformation. Posterior circulation: Calcified plaque in the V4 segment of the left vertebral artery. No significant stenosis, proximal occlusion, aneurysm, or vascular malformation. Venous sinuses: As permitted by contrast timing, patent. Anatomic variants: None Review of the MIP images confirms the above findings IMPRESSION: 1. No large vessel occlusion, hemodynamically significant stenosis, or evidence of  dissection. 2. Status post left carotid end arterectomy. 3. Calcified plaque in the right carotid bifurcation without hemodynamically significant stenosis. 4. Enlarged and heterogeneous right thyroid. 5. Aortic atherosclerosis. Aortic Atherosclerosis (ICD10-I70.0). Electronically Signed   By: Baldemar Lenis M.D.   On: 03/11/2021 17:56   CT ANGIO NECK CODE STROKE  Result Date: 03/11/2021 CLINICAL DATA:  Neuro deficit, acute, stroke suspected. EXAM: CT ANGIOGRAPHY HEAD AND NECK TECHNIQUE: Multidetector CT imaging of the head and neck was performed using the standard protocol during bolus administration of intravenous contrast. Multiplanar CT image reconstructions and MIPs were obtained to evaluate the vascular anatomy. Carotid stenosis measurements (when applicable) are obtained utilizing NASCET criteria, using the distal internal carotid diameter as the denominator. CONTRAST:  70mL OMNIPAQUE IOHEXOL 350 MG/ML SOLN COMPARISON:  Head CT March 11, 2021. FINDINGS: CTA NECK FINDINGS Aortic arch: Common origin of the innominate and left common carotid artery from the aortic arch. Imaged portion shows no evidence of aneurysm or dissection. Calcified plaques in the aortic arch and at the origin of the left subclavian artery. No significant stenosis of the major arch vessel origins. Right carotid system: Calcified plaque in the right carotid bifurcation without hemodynamically significant stenosis. Left carotid system: Status post end arterectomy. No evidence of stenosis, dissection or occlusion. Vertebral arteries: Left dominant. No evidence of dissection, stenosis (50% or greater) or occlusion. Skeleton: Degenerative changes of the cervical spine. No aggressive bone lesion identified Other neck: Enlarged and heterogeneous right thyroid Upper chest: Negative. Review of the MIP images confirms the above findings CTA HEAD FINDINGS Anterior circulation: Calcified plaques in the bilateral carotid siphons. No  significant stenosis, proximal occlusion, aneurysm, or vascular malformation. Posterior circulation: Calcified plaque in the V4 segment of the left vertebral artery. No significant stenosis, proximal occlusion, aneurysm, or vascular malformation. Venous sinuses: As permitted by contrast timing, patent. Anatomic variants: None Review of the MIP images confirms the above findings IMPRESSION: 1. No large vessel occlusion, hemodynamically significant stenosis, or evidence of dissection. 2. Status post left carotid end arterectomy. 3. Calcified plaque in the right carotid bifurcation without hemodynamically significant stenosis. 4. Enlarged and heterogeneous right thyroid. 5. Aortic atherosclerosis. Aortic Atherosclerosis (ICD10-I70.0). Electronically Signed   By: Baldemar Lenis M.D.   On: 03/11/2021 17:56    EKG: personally reviewed my interpretation is normal sinus rhythm with a rate of 60 bpm.  No pacing spikes are present.  Compared to prior from 12/2020 no changes.  ASSESSMENT & PLAN:    Assessment & Plan by Problem: Active Problems:   TIA (transient ischemic attack)   [name] is a 80 y.o. 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 Pearlean Brownie in the past for his recurrent aphasic episodes thought to be secondary to TIA's, however, MRI has not been performed to confirm this. There has been concern for Left ICA plaque leading to recurrent aphasic episodes. Since endarterectomy, patient has been admitted twice since for transient aphasic episodes.  Last work-up CTA head and neck showed evidence consistent with left carotid endarterectomy  with potential flap which may be chronic.  Unable to have MRI performed secondary to pacemaker incompatibility.  He has been on Eliquis and aspirin since.  Patient states his blood pressures have been increased recently and he was given additional BP medication to start today, EMS states systolic blood pressure of 80s that improved to 117.  Possibly hypoperfusion as etiology of aphasic episodes, uncertain as to why he was hypotensive, possibly dehydration.  Low suspicion for cardiac etiology.  Stroke team following, appreciate their recommendations.  CTA head without contrast without acute finding, CTA head and neck no large vessel occlusion or evidence of hemodynamic instability from stenosis. ABCD2 score of 3 - 2-Day Stroke Risk: 1.0%, 7-Day Stroke Risk: 1.2%, 90-Day Stroke Risk: 3.1% -Frequent neurochecks -Echocardiogram -Orthostatic vital signs -PT/OT, recurrent falls patient describes difficulty lifting foot at time. May be foot drop if he is having recurrent TIA's.  -Passed bedside swallow eval, will start diet -Hold BP meds -Continue Eliquis and aspirin -Hgb A1c 5.6. 2 months ago -Lipid panel total cholesterol 78, LDL 34.  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 fib.  Been on Eliquis since endarterectomy.  Marland Kitchen He does endorse adherence to his Eliquis and aspirin. He doesn't appear to be paced on EKG.  -Cardiac monitoring -Do not believe device interrogation needs to occur -Continue Eliquis -Hold metoprolol in setting of potential hypoperfusion.  Will restart if patient becomes tachycardic.  Hypertension Patient with history of high blood pressure on home medication regimen of metoprolol tartrate 25 mg daily, lisinopril 2.5 daily, and recently prescribed medication states within a patient was unable to pick up.  Not on medication list.  Pharmacy [facility] [address] closed at this time. -Continue blood pressure monitoring and hold antihypertensives  AKI Hx CKD Stg II Creatinine of 1.56.  During recent hospitalizations creatinine has been variable, ranging from 1.30- 0.99.  Per chart review patient with history of CKD stage II.  Suspect this is prerenal etiology secondary to p.o. intake and hypotension.  Patient received IV fluids in ED we will continue to monitor and repeat BMP tomorrow.  Patient urinating well did not believe he has any type of obstruction. -Trend daily bmp  Normocytic Anemia Hemoglobin of 12.8, MCV of 97.6.  Consistent with normocytic anemia.  Patient states he does have history of iron deficiency anemia and was recently started on iron supplementation.  Patient denies bloody stools or blood in urine.  No suspicion for an acute bleed. -Consider reticulocyte  count and iron studies -Continue ferrous sulfate tab nightly -Continue Eliquis/aspirin unless evidence of acute bleed  Partial Thyroidectomy Patient with history of partial thyroidectomy secondary to enlarged thyroid per patient.  He is not on medication at this time.  Enlarged and heterogeneous right thyroid on CTA today. -TSH pending  Diet: Normal diet VTE: NOAC IVF: None,None Code: Full  Prior to Admission Living Arrangement: Assisted Living w/ wife Anticipated Discharge Location: Assisted living with wife Barriers to Discharge: Further evaluation and work-up of TIA  Dispo: Admit patient to Inpatient with expected length of stay greater than 2 midnights.  Signed: Belva Agee, MD Internal Medicine Resident PGY-1 Pager: [phone]  03/11/2021, 11:05 PM

## 2021-03-11 NOTE — ED Notes (Signed)
Pt aware of need for urine sample.  

## 2021-03-11 NOTE — ED Notes (Signed)
Meal try ordered for this pt

## 2021-03-11 NOTE — Consult Note (Signed)
Neurology Consultation  Reason for Consult: Aphasia Referring Physician: Dr. Particia Nearing  CC: Aphasia  History is obtained from: Patient, EMS, Chart review  HPI: [name] is a 80 y.o. 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 He states that he was sitting at his table at his assisted living facility having a glass of wine when he began to feel dizzy and then acutely had word finding difficulties. EMS on scene reported initial SBP in 80s but on immediate recheck it was 117. Current episode is similar to recurrent TIAs followed by Dr. Pearlean Brownie. He is s/p L CEA Nov 2021 2/2 concern for L ICA plaque causing stereotyped recurrent episodes of isolated aphasia.    LKW: 15:15 tpa given?: no, fully anticoagulated on Eliquis- last dose this morning IR Thrombectomy? No, no LVO suspected on assessment Modified Rankin Scale: 1-No significant post stroke disability and can perform usual duties with stroke symptoms  ROS: A complete ROS was performed and is negative except as noted in the HPI.   Past Medical History:  Diagnosis Date  . Carotid artery occlusion   . GERD (gastroesophageal reflux disease)   . Hypertension   . Stroke [facility])    TIA   Past Surgical History:  Procedure Laterality Date  . CATARACT EXTRACTION, BILATERAL    . ENDARTERECTOMY Left 09/16/2020   Procedure: LEFT ENDARTERECTOMY CAROTID;  Surgeon: Maeola Harman, MD;  Location: [facility] OR;  Service: Vascular;  Laterality: Left;  . HERNIA REPAIR    . PACEMAKER IMPLANT    . TONSILLECTOMY     Family History  Family history unknown: Yes   Social History:   reports that he has quit smoking. He has never used smokeless  tobacco. He reports current alcohol use. No history on file for drug use.  Medications No current facility-administered medications for this encounter.  Current Outpatient Medications:  .  albuterol (VENTOLIN HFA) 108 (90 Base) MCG/ACT inhaler, Inhale 2 puffs into the lungs 4 (four) times daily as needed for wheezing or shortness of breath., Disp: , Rfl:  .  apixaban (ELIQUIS) 5 MG TABS tablet, Take 1 tablet (5 mg total) by mouth 2 (two) times daily., Disp: 60 tablet, Rfl: 1 .  aspirin EC 81 MG tablet, Take 1 tablet (81 mg total) by mouth daily. Swallow whole., Disp: 30 tablet, Rfl: 1 .  atorvastatin (LIPITOR) 40 MG tablet, Take 1 tablet (40 mg total) by mouth daily., Disp: 30 tablet, Rfl: 1 .  cholecalciferol (VITAMIN D) 25 MCG (1000 UNIT) tablet, Take 1,000 Units by mouth at bedtime., Disp: , Rfl:  .  Fluticasone Furoate (ARNUITY ELLIPTA) 200 MCG/ACT AEPB, Inhale 1 puff into the lungs every morning., Disp: , Rfl:  .  Magnesium 250 MG TABS, Take 250 mg by mouth every morning., Disp: , Rfl:  .  metoprolol tartrate (LOPRESSOR) 50 MG tablet, Take 25 mg by mouth daily. Half tab of 50mg  , Disp: , Rfl:  .  Multiple Vitamin (MULTIVITAMIN WITH MINERALS) TABS tablet, Take 1 tablet by mouth every morning., Disp: , Rfl:  .  Omega-3 Fatty Acids (FISH OIL PO), Take 1 capsule by mouth every morning., Disp: , Rfl:  .  omeprazole (PRILOSEC) 40 MG capsule, Take 40  mg by mouth daily before supper., Disp: , Rfl:   Exam: Current vital signs: BP (!) 141/62   Pulse 63   Temp 98.2 F (36.8 C) (Oral)   Resp 16   SpO2 100%  Vital signs in last 24 hours: Temp:  [98.2 F (36.8 C)] 98.2 F (36.8 C) (04/29 1745) Pulse Rate:  [63] 63 (04/29 1745) Resp:  [16] 16 (04/29 1745) BP: (135-141)/(62-63) 141/62 (04/29 1745) SpO2:  [99 %-100 %] 100 % (04/29 1745)  GENERAL: Awake, alert, in no acute distress Psych: Affect appropriate for situation, calm and cooperative with examination Head: Normocephalic and  atraumatic EENT: Normal conjunctivae, dry mm, no OP obstruction, hearing aids in place due to [facility] LUNGS: Normal respiratory effort. Non-labored breathing CV: Regular rate on cardiac monitor, no extremity edema noted ABDOMEN: Soft, non-tender Ext: warm, well perfused, no obvious deformity Integumentary: Scattered scabbing and ecchymoses. Various bandages and wrappings around his arms from previous falls.   NEURO:  Mental Status: Alert and oriented to person, place, time, age, month, and situation. He is able to provide a clear and coherent history of present illness. Speech is without dysarthria. He does have mild aphasia with some word finding difficulties but with improvement (per EMS) from initial word finding difficulties. Naming is intact, however delayed responses due to some trouble with word finding, repetition, fluency, and comprehension intact. No neglect is noted on examination. Cranial Nerves:  II: PERRL 2 mm/brisk. Visual fields full.  III, IV, VI: EOMI. Lid elevation symmetric and full.  V: Sensation is intact to light touch and symmetrical to face.  VII: Face is symmetric resting and smiling.  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 Phonation normal.  XI: Normal sternocleidomastoid and trapezius muscle strength XII: Tongue protrudes midline without fasciculations.   Motor: 5/5 strength is all muscle groups without vertical drift on assessment. Tone and bulk are normal. Sensation: Intact to light touch bilaterally in all four extremities. No extinction to DSS present.  Coordination: FTN intact bilaterally. HKS intact bilaterally. No pronator drift. Alternating hand movements.  DTRs: 2+ and symmetric patellae and biceps. Plantar: Toes downgoing bilaterally  Gait- deferred  NIHSS: 1a Level of Conscious.: 0 1b LOC Questions: 0 1c LOC Commands: 0 2 Best Gaze: 0 3 Visual: 0 4 Facial Palsy: 0 5a  Motor Arm - left: 0 5b Motor Arm - Right: 0 6a Motor Leg - Left: 0 6b Motor Leg - Right: 0 7 Limb Ataxia: 0 8 Sensory: 0 9 Best Language: 1 10 Dysarthria: 0 11 Extinct. and Inatten.: 0 TOTAL: 1  Labs I have reviewed labs in epic and the results pertinent to this consultation are: CBC    Component Value Date/Time   WBC 4.9 03/11/2021 1717   RBC 3.82 (L) 03/11/2021 1717   HGB 11.9 (L) 03/11/2021 1723   HCT 35.0 (L) 03/11/2021 1723   PLT 166 03/11/2021 1717   MCV 97.6 03/11/2021 1717   MCH 33.5 03/11/2021 1717   MCHC 34.3 03/11/2021 1717   RDW 13.6 03/11/2021 1717   LYMPHSABS 1.7 03/11/2021 1717   MONOABS 0.4 03/11/2021 1717   EOSABS 0.0 03/11/2021 1717   BASOSABS 0.0 03/11/2021 1717   CMP     Component Value Date/Time   NA 138 03/11/2021 1723   K 4.3 03/11/2021 1723   CL 105 03/11/2021 1723   CO2 23 12/26/2020 0945   GLUCOSE 141 (H) 03/11/2021 1723  BUN 26 (H) 03/11/2021 1723   CREATININE 1.60 (H) 03/11/2021 1723   CALCIUM 9.1 12/26/2020 0945   PROT 6.4 (L) 12/24/2020 1712   ALBUMIN 3.8 12/24/2020 1712   AST 29 12/24/2020 1712   ALT 34 12/24/2020 1712   ALKPHOS 103 12/24/2020 1712   BILITOT 1.0 12/24/2020 1712   GFRNONAA >60 12/26/2020 0945   Lipid Panel     Component Value Date/Time   CHOL 87 12/25/2020 0331   TRIG 69 12/25/2020 0331   HDL 39 (L) 12/25/2020 0331   CHOLHDL 2.2 12/25/2020 0331   VLDL 14 12/25/2020 0331   LDLCALC 34 12/25/2020 0331   Lab Results  Component Value Date   HGBA1C 5.6 12/25/2020   Imaging I have reviewed the images obtained: CT-scan of the brain: IMPRESSION: 1. No acute finding by CT. Atrophy and chronic small-vessel ischemic changes. 2. ASPECTS is 10. Clinical insult side not described. However, the calculation would be the same.  CTA head and neck: IMPRESSION: 1. No large vessel occlusion, hemodynamically significant stenosis, or evidence of dissection. 2. Status post left carotid end arterectomy. 3. Calcified  plaque in the right carotid bifurcation without hemodynamically significant stenosis. 4. Enlarged and heterogeneous right thyroid. 5. Aortic atherosclerosis.  Echocardiogram 12/25/2020: 1. Left ventricular ejection fraction, by estimation, is 60 to 65%. The left ventricle has normal function. The left ventricle has no regional wall motion abnormalities. There is mild concentric left ventricular hypertrophy. Left ventricular diastolic parameters are consistent with Grade I diastolic dysfunction (impaired relaxation).  2. Right ventricular systolic function is normal. The right ventricular size is normal. There is normal pulmonary artery systolic pressure.  3. The mitral valve is grossly normal. Trivial mitral valve regurgitation.  4. The aortic valve is tricuspid. Aortic valve regurgitation is trivial.  5. Aortic dilatation noted. There is mild dilatation of the aortic root, measuring 39 mm. There is mild dilatation of the ascending aorta, measuring 37 mm.  6. The inferior vena cava is normal in size with greater than 50% respiratory variability, suggesting right atrial pressure of 3 mmHg.  Assessment: 80 year old male who presents with acute onset of expressive aphasia. - Examination reveals patient with improved aphasia on arrival with minimal residual word finding difficulties without further neurologic deficits noted. - Initial imaging without acute intracranial abnormalities, LVO, hemodynamically significant stenosis, or evidence of dissection.  - Patient is s/p left CEA in November 2021 for multiple presentations of word finding difficulties.  - DDx includes TIA versus cerebral hypoperfusion due to hypotension. Stroke work up pending.  - Hemoglobin A1c 12/25/20: 5.6%, LDL well controlled at 34  Impression: Acute transient aphasia  Recommendations: - MRI of the brain without contrast - Continue home atorvastatin - Frequent neuro checks - Orthostatic vital signs - Prophylactic  therapy: Continue home Eliquis, ASA - Risk factor modification - Telemetry monitoring - PT consult, OT consult, Speech consult - Admit overnight, to be evaluated by stroke team tomorrow  Pt seen by NP/Neuro and later by MD. Note/plan to be edited by MD as needed.  Lanae Boast, AGAC-NP [facility] Pager: [phone]  I have edited the above note to reflect my findings and recommendations. I was present for all aspects of the stroke code and made all decisions including not administering tPA.  Bing Neighbors, MD [facility] [phone]  If 7pm- 7am, please page neurology on call as listed in AMION.

## 2021-03-11 NOTE — Progress Notes (Signed)
Pt admitted from ED with stroke like symptoms, pt alert and oriented, denies any pain at this time, settled in bed with call light within pt;'s reach, tele monitor put and verified on pt, safety concern addressed as well, was however reassured and will continue to monitor, v/s stable. [name], [name]

## 2021-03-12 ENCOUNTER — Other Ambulatory Visit (HOSPITAL_COMMUNITY): Payer: Medicare Other

## 2021-03-12 ENCOUNTER — Inpatient Hospital Stay (HOSPITAL_COMMUNITY): Payer: Medicare Other

## 2021-03-12 DIAGNOSIS — R4701 Aphasia: Secondary | ICD-10-CM | POA: Diagnosis not present

## 2021-03-12 DIAGNOSIS — I4892 Unspecified atrial flutter: Secondary | ICD-10-CM

## 2021-03-12 DIAGNOSIS — G459 Transient cerebral ischemic attack, unspecified: Secondary | ICD-10-CM | POA: Diagnosis not present

## 2021-03-12 DIAGNOSIS — I10 Essential (primary) hypertension: Secondary | ICD-10-CM | POA: Diagnosis not present

## 2021-03-12 DIAGNOSIS — N182 Chronic kidney disease, stage 2 (mild): Secondary | ICD-10-CM

## 2021-03-12 DIAGNOSIS — I480 Paroxysmal atrial fibrillation: Secondary | ICD-10-CM

## 2021-03-12 DIAGNOSIS — N179 Acute kidney failure, unspecified: Secondary | ICD-10-CM

## 2021-03-12 DIAGNOSIS — R001 Bradycardia, unspecified: Secondary | ICD-10-CM

## 2021-03-12 LAB — BASIC METABOLIC PANEL
Anion gap: 8 (ref 5–15)
BUN: 23 mg/dL (ref 8–23)
CO2: 25 mmol/L (ref 22–32)
Calcium: 8.5 mg/dL — ABNORMAL LOW (ref 8.9–10.3)
Chloride: 105 mmol/L (ref 98–111)
Creatinine, Ser: 1.1 mg/dL (ref 0.61–1.24)
GFR, Estimated: >60 mL/min (ref >60)
Glucose, Bld: 110 mg/dL — ABNORMAL HIGH (ref 70–99)
Potassium: 4.1 mmol/L (ref 3.5–5.1)
Sodium: 138 mmol/L (ref 135–145)

## 2021-03-12 LAB — RAPID URINE DRUG SCREEN, HOSP PERFORMED
Amphetamines: NOT DETECTED
Barbiturates: NOT DETECTED
Benzodiazepines: NOT DETECTED
Cocaine: NOT DETECTED
Opiates: NOT DETECTED
Tetrahydrocannabinol: NOT DETECTED

## 2021-03-12 LAB — LIPID PANEL
Cholesterol: 78 mg/dL (ref 0–200)
HDL: 33 mg/dL — ABNORMAL LOW (ref >40)
LDL Cholesterol: 31 mg/dL (ref 0–99)
Total CHOL/HDL Ratio: 2.4 RATIO
Triglycerides: 68 mg/dL (ref <150)
VLDL: 14 mg/dL (ref 0–40)

## 2021-03-12 LAB — TSH: TSH: 0.391 u[IU]/mL (ref 0.350–4.500)

## 2021-03-12 LAB — HEMOGLOBIN A1C
Hgb A1c MFr Bld: 5.6 % (ref 4.8–5.6)
Mean Plasma Glucose: 114.02 mg/dL

## 2021-03-12 MED ORDER — SODIUM CHLORIDE 0.9 % IV SOLN: INTRAVENOUS | Status: DC

## 2021-03-12 NOTE — Progress Notes (Signed)
Subjective: HD1 Overnight, patient admitted for transient aphasia.  This morning, Cameron [name] was evaluated at bedside. Patient reports an episode of expressive aphasia yesterday morning. Symptoms resolved when getting to the hospital. Reports constipation on Sunday. States that the pacemaker is not compatible with MRI, placed 6 years ago in [place].   Objective:  Vital signs in last 24 hours: Vitals:   03/12/21 0000 03/12/21 0200 03/12/21 0400 03/12/21 0600  BP: 131/63 138/65 128/61 (!) 146/61  Pulse: (!) 59 63 62 63  Resp: 16 18 16 16   Temp: 98 F (36.7 C) 97.9 F (36.6 C) 98 F (36.7 C) 97.7 F (36.5 C)  TempSrc: Oral Oral Oral Oral  SpO2: 97% 98% 98% 98%  Weight:      Height:        CBC Latest Ref Rng & Units 03/11/2021 03/11/2021 12/27/2020  WBC 4.0 - 10.5 K/uL - 4.9 4.5  Hemoglobin 13.0 - 17.0 g/dL 11.9(L) 12.8(L) 11.4(L)  Hematocrit 39.0 - 52.0 % 35.0(L) 37.3(L) 33.4(L)  Platelets 150 - 400 K/uL - 166 153   BMP Latest Ref Rng & Units 03/12/2021 03/11/2021 03/11/2021  Glucose 70 - 99 mg/dL 03/13/2021) 831(D) 176(H)  BUN 8 - 23 mg/dL 23 607(P) 71(G)  Creatinine 0.61 - 1.24 mg/dL 62(I 9.48) 5.46(E)  Sodium 135 - 145 mmol/L 138 138 136  Potassium 3.5 - 5.1 mmol/L 4.1 4.3 4.3  Chloride 98 - 111 mmol/L 105 105 104  CO2 22 - 32 mmol/L 25 - 22  Calcium 8.9 - 10.3 mg/dL 7.03(J) - 8.7(L)    Physical Exam  Constitutional: Elderly male, well-developed and well-nourished. No distress.  HENT: Normocephalic and atraumatic, EOMI, moist mucous membranes Cardiovascular: Normal rate, regular rhythm, S1 and S2 present, no murmurs, rubs, gallops.  Distal pulses intact Respiratory: No respiratory distress, Effort is normal.  Lungs are clear to auscultation bilaterally. Saturating 100% on room air GI: Nondistended, soft, nontender to palpation  Musculoskeletal: Normal bulk and tone.  No peripheral edema noted. Neurological: Is alert and oriented x4, no facial asymmetry, EOMI,  spontaneously moving all extremities; no apparent focal deficits noted. Skin: Warm and dry.  No rash, erythema, lesions noted. Psychiatric: Normal mood and affect. Behavior is normal. Judgment and thought content normal.   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   Transient aphasia 2/2 TIA vs Stroke Hx of recurrent TIA's s/p left CEA  Patient has a history of recurrent TIA's with symptoms of expressive aphasia. He had a brief episode of similar symptoms yesterday evening for which he was admitted. Symptoms have resolved on exam today. No recent illness or decreased oral intake but does note recent increase in his metoprolol dosing. Suspect this may have caused transient hypotension which could have resulted in his symptoms. He is currently NSR with holding metoprolol. Unable to do MRI due to device incompatibility. Will plan for repeat CT head this evening.  - Stroke team consulted, appreciate their recommendations - F/u PT/OT recommendations - F/u Echo  - Continue Eliquis and aspirin  - Holding metoprolol at this time - Continue home atorvastatin 40mg  daily  Atrial flutter on Eliquis  Bradycardia s/p PPM 2016 Hx of device detected A.fib/flutter for which he has been on Eliquis and metoprolol. Hx  of tachycardia; however, most recent EKG's have been normal sinus rhythm. Patient with NSR off metoprolol during this admission with HR in 70s.  - Continue to hold metoprolol at this time - Continue Eliquis 5mg  bid  - Continue cardiac monitoring   Hx of hypertension Patient with history of hypertension on lisinopril 2.5mg  daily and metoprolol 25mg  twice daily. These were held on admission. SBP 130-140s. Suspect that his recent episode  was in setting of possible hypoperfusion due to transient hypotension from increased metoprolol dose. May consider liberal BP goals. - Continue to monitor BP, goal SBP <160  AKI on CKDII Baseline sCr 0.9-1.0. Noted to be up to 1.6 on admission, likely prerenal.  Improved to 1.1 this AM with IV fluids given in ED. Good urine output  - Continue to monitor renal function - Avoid nephrotoxic agents as able   Diet: Regular  Fluids: None  DVT Prophylaxis: Eliquis bid  Code status: FULL  Prior to Admission Living Arrangement: ALF Anticipated Discharge Location: ALF Barriers to Discharge: Continued work up  Dispo: Anticipated discharge in approximately 0-1 day(s).   , MD  [facility] PGY-2 03/12/2021, 6:34 AM Pager: [phone] After 5pm on weekdays and 1pm on weekends: On Call pager [phone]

## 2021-03-12 NOTE — Progress Notes (Signed)
STROKE TEAM PROGRESS NOTE   INTERVAL HISTORY His wife is at the bedside.  Patient and wife recounted HPI with me.  Patient stated that he went to me his friends along with his wife in the afternoon yesterday.  While talking to a friend, he felt tired, yawning, words not coming out of the right, garbled speech, symptoms similar to his previous stroke/TIA.  EMS called, BP was 88/51, later on recheck SBP 117.  He was sent to ER for evaluation, so far all work-up negative except creatinine was high 1.6 last night.  Currently creatinine normalized after IV fluid.  EEG normal.   OBJECTIVE Vitals:   03/12/21 0600 03/12/21 0739 03/12/21 1117 03/12/21 1544  BP: (!) 146/61 (!) 144/65 132/65 (!) 119/50  Pulse: 63 63 66 60  Resp: 16 18 18    Temp: 97.7 F (36.5 C) (!) 97.5 F (36.4 C) 98.4 F (36.9 C) (!) 97.5 F (36.4 C)  TempSrc: Oral Oral Oral Oral  SpO2: 98% 100% 97% 99%  Weight:      Height:        CBC:  Recent Labs  Lab 03/11/21 1717 03/11/21 1723  WBC 4.9  --   NEUTROABS 2.8  --   HGB 12.8* 11.9*  HCT 37.3* 35.0*  MCV 97.6  --   PLT 166  --     Basic Metabolic Panel:  Recent Labs  Lab 03/11/21 1717 03/11/21 1723 03/12/21 0242  NA 136 138 138  K 4.3 4.3 4.1  CL 104 105 105  CO2 22  --  25  GLUCOSE 147* 141* 110*  BUN 26* 26* 23  CREATININE 1.56* 1.60* 1.10  CALCIUM 8.7*  --  8.5*    Lipid Panel:     Component Value Date/Time   CHOL 78 03/12/2021 0242   TRIG 68 03/12/2021 0242   HDL 33 (L) 03/12/2021 0242   CHOLHDL 2.4 03/12/2021 0242   VLDL 14 03/12/2021 0242   LDLCALC 31 03/12/2021 0242   HgbA1c:  Lab Results  Component Value Date   HGBA1C 5.6 03/12/2021   Urine Drug Screen:     Component Value Date/Time   LABOPIA NONE DETECTED 03/11/2021 2315   COCAINSCRNUR NONE DETECTED 03/11/2021 2315   LABBENZ NONE DETECTED 03/11/2021 2315   AMPHETMU NONE DETECTED 03/11/2021 2315   THCU NONE DETECTED 03/11/2021 2315   LABBARB NONE DETECTED 03/11/2021 2315     Alcohol Level     Component Value Date/Time   ETH <10 03/11/2021 1717    IMAGING  CT HEAD CODE STROKE WO CONTRAST Result Date: 03/11/2021 CLINICAL DATA:  Code stroke. Acute neurological deficit. Stroke suspected. EXAM: CT HEAD WITHOUT CONTRAST TECHNIQUE: Contiguous axial images were obtained from the base of the skull through the vertex without intravenous contrast. COMPARISON:  12/24/2020 FINDINGS: Brain: Generalized atrophy. Chronic small-vessel ischemic changes affect pons. No focal cerebellar insult. Cerebral hemispheres show chronic small-vessel ischemic changes of the white matter. No sign of old or acute cortical or subcortical infarction. No mass lesion, hemorrhage, hydrocephalus or extra-axial collection. Vascular: There is atherosclerotic calcification of the major vessels at the base of the brain. Skull: Negative Sinuses/Orbits: Clear/normal Other: None ASPECTS (Alberta Stroke Program Early CT Score). Clinical insult side not described. However, the calculation would be the same. - Ganglionic level infarction (caudate, lentiform nuclei, internal capsule, insula, M1-M3 cortex): 7 - Supraganglionic infarction (M4-M6 cortex): 3 Total score (0-10 with 10 being normal): 10 IMPRESSION: 1. No acute finding by CT. Atrophy and chronic small-vessel ischemic  changes. 2. ASPECTS is 10. Clinical insult side not described. However, the calculation would be the same. 3. These results were communicated to Dr. Selina Cooley At 5:28 pmon 4/29/2022by text page via the [facility] messaging system. Electronically Signed   By: Paulina Fusi M.D.   On: 03/11/2021 17:30   CT ANGIO HEAD CODE STROKE Result Date: 03/11/2021 CLINICAL DATA:  Neuro deficit, acute, stroke suspected. EXAM: CT ANGIOGRAPHY HEAD AND NECK TECHNIQUE: Multidetector CT imaging of the head and neck was performed using the standard protocol during bolus administration of intravenous contrast. Multiplanar CT image reconstructions and MIPs were obtained to  evaluate the vascular anatomy. Carotid stenosis measurements (when applicable) are obtained utilizing NASCET criteria, using the distal internal carotid diameter as the denominator. CONTRAST:  77mL OMNIPAQUE IOHEXOL 350 MG/ML SOLN COMPARISON:  Head CT March 11, 2021. FINDINGS: CTA NECK FINDINGS Aortic arch: Common origin of the innominate and left common carotid artery from the aortic arch. Imaged portion shows no evidence of aneurysm or dissection. Calcified plaques in the aortic arch and at the origin of the left subclavian artery. No significant stenosis of the major arch vessel origins. Right carotid system: Calcified plaque in the right carotid bifurcation without hemodynamically significant stenosis. Left carotid system: Status post end arterectomy. No evidence of stenosis, dissection or occlusion. Vertebral arteries: Left dominant. No evidence of dissection, stenosis (50% or greater) or occlusion. Skeleton: Degenerative changes of the cervical spine. No aggressive bone lesion identified Other neck: Enlarged and heterogeneous right thyroid Upper chest: Negative. Review of the MIP images confirms the above findings CTA HEAD FINDINGS Anterior circulation: Calcified plaques in the bilateral carotid siphons. No significant stenosis, proximal occlusion, aneurysm, or vascular malformation. Posterior circulation: Calcified plaque in the V4 segment of the left vertebral artery. No significant stenosis, proximal occlusion, aneurysm, or vascular malformation. Venous sinuses: As permitted by contrast timing, patent. Anatomic variants: None Review of the MIP images confirms the above findings IMPRESSION: 1. No large vessel occlusion, hemodynamically significant stenosis, or evidence of dissection. 2. Status post left carotid end arterectomy. 3. Calcified plaque in the right carotid bifurcation without hemodynamically significant stenosis. 4. Enlarged and heterogeneous right thyroid. 5. Aortic atherosclerosis. Aortic  Atherosclerosis (ICD10-I70.0). Electronically Signed   By: Baldemar Lenis M.D.   On: 03/11/2021 17:56   CT ANGIO NECK CODE STROKE Result Date: 03/11/2021 CLINICAL DATA:  Neuro deficit, acute, stroke suspected. EXAM: CT ANGIOGRAPHY HEAD AND NECK TECHNIQUE: Multidetector CT imaging of the head and neck was performed using the standard protocol during bolus administration of intravenous contrast. Multiplanar CT image reconstructions and MIPs were obtained to evaluate the vascular anatomy. Carotid stenosis measurements (when applicable) are obtained utilizing NASCET criteria, using the distal internal carotid diameter as the denominator. CONTRAST:  31mL OMNIPAQUE IOHEXOL 350 MG/ML SOLN COMPARISON:  Head CT March 11, 2021. FINDINGS: CTA NECK FINDINGS Aortic arch: Common origin of the innominate and left common carotid artery from the aortic arch. Imaged portion shows no evidence of aneurysm or dissection. Calcified plaques in the aortic arch and at the origin of the left subclavian artery. No significant stenosis of the major arch vessel origins. Right carotid system: Calcified plaque in the right carotid bifurcation without hemodynamically significant stenosis. Left carotid system: Status post end arterectomy. No evidence of stenosis, dissection or occlusion. Vertebral arteries: Left dominant. No evidence of dissection, stenosis (50% or greater) or occlusion. Skeleton: Degenerative changes of the cervical spine. No aggressive bone lesion identified Other neck: Enlarged and heterogeneous right thyroid Upper  chest: Negative. Review of the MIP images confirms the above findings CTA HEAD FINDINGS Anterior circulation: Calcified plaques in the bilateral carotid siphons. No significant stenosis, proximal occlusion, aneurysm, or vascular malformation. Posterior circulation: Calcified plaque in the V4 segment of the left vertebral artery. No significant stenosis, proximal occlusion, aneurysm, or vascular  malformation. Venous sinuses: As permitted by contrast timing, patent. Anatomic variants: None Review of the MIP images confirms the above findings IMPRESSION: 1. No large vessel occlusion, hemodynamically significant stenosis, or evidence of dissection. 2. Status post left carotid end arterectomy. 3. Calcified plaque in the right carotid bifurcation without hemodynamically significant stenosis. 4. Enlarged and heterogeneous right thyroid. 5. Aortic atherosclerosis. Aortic Atherosclerosis (ICD10-I70.0). Electronically Signed   By: Baldemar Lenis M.D.   On: 03/11/2021 17:56   CT HEAD WITHOUT CONTRAST 03/12/2021 TECHNIQUE: Contiguous axial images were obtained from the base of the skull through the vertex without intravenous contrast. COMPARISON:  CT studies yesterday. FINDINGS: Brain: No focal abnormality of the brainstem or cerebellum. Cerebral hemispheres show chronic small-vessel ischemic changes of the white matter. No sign of acute infarction, mass lesion, hemorrhage, hydrocephalus or extra-axial collection. Vascular: There is atherosclerotic calcification of the major vessels at the base of the brain. Skull: Negative Sinuses/Orbits: Clear/normal Other: None IMPRESSION: No change since yesterday. Chronic small-vessel ischemic changes of the cerebral hemispheric white matter.   Transthoracic Echocardiogram  00/00/2021 Pending  ECG - SR rate 62 BPM. (See cardiology reading for complete details  EEG This study is within normal limits. No seizures or epileptiform discharges were seen throughout the recording. The excessive beta activity seen in the background is most likely due to the effect of medications like benzodiazepine and is a benign EEG pattern.  PHYSICAL EXAM  Temp:  [97.4 F (36.3 C)-98.4 F (36.9 C)] 97.5 F (36.4 C) (04/30 1544) Pulse Rate:  [56-68] 60 (04/30 1544) Resp:  [15-25] 18 (04/30 1117) BP: (115-163)/(50-97) 119/50 (04/30 1544) SpO2:  [97  %-100 %] 99 % (04/30 1544) Weight:  [75 kg] 75 kg (04/29 2252)  General - Well nourished, well developed, in no apparent distress.  Ophthalmologic - fundi not visualized due to noncooperation.  Cardiovascular - Regular rhythm and rate.  Mental Status -  Level of arousal and orientation to time, place, and person were intact. Language including expression, naming, repetition, comprehension was assessed and found intact.  Cranial Nerves II - XII - II - Visual field intact OU. III, IV, VI - Extraocular movements intact. V - Facial sensation intact bilaterally. VII - Facial movement intact bilaterally. VIII - Hearing & vestibular intact bilaterally. X - Palate elevates symmetrically. XI - Chin turning & shoulder shrug intact bilaterally. XII - Tongue protrusion intact.  Motor Strength - The patient's strength was normal in all extremities and pronator drift was absent.  Bulk was normal and fasciculations were absent.   Motor Tone - Muscle tone was assessed at the neck and appendages and was normal.  Reflexes - The patient's reflexes were symmetrical in all extremities and he had no pathological reflexes.  Sensory - Light touch, temperature/pinprick were assessed and were symmetrical.    Coordination - The patient had normal movements in the hands with no ataxia or dysmetria.  Tremor was absent.  Gait and Station - deferred.   ASSESSMENT/PLAN Mr. Tome Wilson is a 80 y.o. 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  He did not receive IV t-PA due to Eliquis.   Recrudescence of stroke symptoms in the setting of hypotension and AKI   CT Head - No acute finding by CT.   CTA H&N  - Status post left carotid end arterectomy.   MRI head - PPM - no MRI  CT head - 03/12/21 - No change since yesterday.   2D Echo - pending  EEG normal  [employer] Virus 2 - negative  LDL - 31  HgbA1c - 5.6   UDS - negative  VTE prophylaxis - Eliquis  aspirin 81 mg daily and Eliquis (apixaban) daily prior to admission, now on aspirin 81 mg daily and Eliquis (apixaban) daily  Patient will be counseled to be compliant with his antithrombotic medications  Ongoing aggressive stroke risk factor management  Therapy recommendations: none  Disposition:  Pending  Episode of hypotension Hx of hypertension  Home BP meds: Zestril ; metoprolol  Current BP meds: none   Stable . Avoid low BP . Orthostatic vital pending . Close home BP monitoring and close PCP follow-up . Long-term BP goal normotensive  History of stroke  09/2020 admitted for episode of word finding difficulty.  Given stereotactic episodes, concerning for high risk plaque at left ICA bulb.  Discussed with Dr. Arbie Cookey, status post left CEA.  Discharged with aspirin and Eliquis.  12/2020, admitted again for episode of word finding difficulty.  Dr. Pearlean Brownie concerning for near syncope.  Chronic A. Fib  Rate controlled  On Eliquis  Continue on discharge  AKI  Baseline creatinine 1.02  This admission cre 1.6  Improved after IV fluid, currently Cre 1.10  Encourage p.o. intake, avoid dehydration  Hyperlipidemia  Home Lipid lowering medication: Lipitor 40 mg daily   LDL 31, goal < 70  Current lipid lowering medication: Lipitor 40 mg daily   Continue statin at discharge  Other Stroke Risk Factors  Advanced age  Former cigarette smoker - quit  ETOH use, advised to drink no more than 1 alcoholic beverage per day.  Family hx stroke - not on file  Bradycardia status post pacemaker 2016  Other Active Problems, Findings, Recommendations and/or Plan  Code status - Full code  Hospital day #  1  Marvel Plan, MD PhD Stroke Neurology 03/12/2021 8:14 PM   To contact Stroke Continuity provider, please refer to [email or web address]. After hours, contact General Neurology

## 2021-03-12 NOTE — Progress Notes (Signed)
EEG complete - results pending 

## 2021-03-12 NOTE — Evaluation (Signed)
Occupational Therapy Evaluation and Discharge Patient Details Name: [name] MRN: [id number] DOB: 1941-06-15 Today's Date: 03/12/2021    History of Present Illness 80 y.o. with pertinent PMH of recurrent TIAs status post left carotid endarterectomy 10/2020 on aspirin,  bradycardia status post pacemaker placement 2016, hypertension, GERD, recent falls who presented from ILF with symptoms of aphasia and admitted for further evaluation of suspected TIA. CCT negative for acute event; cannot do MRI due to pacemaker   Clinical Impression   This 80 yo male admitted with above presents to acute OT with PLOF of being independent to Mod I with basic ADLs and pt is back to his baseline, acute OT will sign off.    Follow Up Recommendations  No OT follow up;Supervision - Intermittent    Equipment Recommendations  None recommended by OT       Precautions / Restrictions Precautions Precautions: Fall Restrictions Weight Bearing Restrictions: No      Mobility Bed Mobility               General bed mobility comments: Pt up in recliner upon my arrival    Transfers Overall transfer level: Modified independent Equipment used: None                  Balance Overall balance assessment: Mild deficits observed, not formally tested                                         ADL either performed or assessed with clinical judgement   ADL Overall ADL's : Modified independent-Independent                                             Vision Baseline Vision/History: Wears glasses Wears Glasses: Reading only Patient Visual Report: No change from baseline              Pertinent Vitals/Pain Pain Assessment: No/denies pain     Hand Dominance Right   Extremity/Trunk Assessment Upper Extremity Assessment Upper Extremity Assessment: Overall WFL for tasks assessed           Communication Communication Communication: No difficulties    Cognition Arousal/Alertness: Awake/alert Behavior During Therapy: WFL for tasks assessed/performed Overall Cognitive Status: Within Functional Limits for tasks assessed                                                Home Living Family/patient expects to be discharged to:: Private residence Living Arrangements: Spouse/significant other Available Help at Discharge: Family;Available 24 hours/day Type of Home: Independent living facility Home Access: Level entry     Home Layout: One level     Bathroom Shower/Tub: Producer, television/film/video: Standard     Home Equipment: Environmental consultant - 2 wheels;Cane - single point;Grab bars - tub/shower;Hand held shower head      Lives With: Spouse    Prior Functioning/Environment Level of Independence: Independent                 OT Problem List: Impaired balance (sitting and/or standing)  AM-PAC OT "6 Clicks" Daily Activity     Outcome Measure Help from another person eating meals?: None Help from another person taking care of personal grooming?: None Help from another person toileting, which includes using toliet, bedpan, or urinal?: None Help from another person bathing (including washing, rinsing, drying)?: None   Help from another person to put on and taking off regular lower body clothing?: None 6 Click Score: 20   End of Session Equipment Utilized During Treatment: Gait belt  Activity Tolerance: Patient tolerated treatment well Patient left: in chair;with call bell/phone within reach;with family/visitor present  OT Visit Diagnosis: Other abnormalities of gait and mobility (R26.89) (pre-morbid per pt)                Time: 6144-3154 OT Time Calculation (min): 15 min Charges:  OT General Charges $OT Visit: 1 Visit OT Evaluation $OT Eval Moderate Complexity: 1 Mod  Ignacia Palma, OTR/L Acute [employer] Pager [phone] Office [phone]     Evette Georges 03/12/2021, 3:01 PM

## 2021-03-12 NOTE — Procedures (Signed)
Patient Name: [name]  MRN: [id number]  Epilepsy Attending: Charlsie Quest  Referring Physician/Provider: Dr Marvel Plan Date: 03/12/2021  Duration: 27.53 mins  Patient history: 80yo M with transient aphasia. EEG to evaluate for seizure.   Level of alertness: Awake  AEDs during EEG study: None  Technical aspects: This EEG study was done with scalp electrodes positioned according to the 10-20 International system of electrode placement. Electrical activity was acquired at a sampling rate of 500Hz  and reviewed with a high frequency filter of 70Hz  and a low frequency filter of 1Hz . EEG data were recorded continuously and digitally stored.   Description: No clear posterior dominant rhythm was seen.  There is an excessive amount of 15 to 18 Hz beta activity with irregular morphology distributed symmetrically and diffusely.  Hyperventilation and photic stimulation were not performed.     ABNORMALITY - Excessive beta, generalized  IMPRESSION: This study is within normal limits. No seizures or epileptiform discharges were seen throughout the recording. The excessive beta activity seen in the background is most likely due to the effect of medications like benzodiazepine and is a benign EEG pattern.   [name] 

## 2021-03-12 NOTE — Evaluation (Signed)
Speech Language Pathology Evaluation Patient Details Name: [name] MRN: [id number] DOB: 28-Aug-1941 Today's Date: 03/12/2021 Time: 6160-7371 SLP Time Calculation (min) (ACUTE ONLY): 20.75 min  Problem List:  Patient Active Problem List   Diagnosis Date Noted  . AKI (acute kidney injury) (HCC) 03/12/2021  . PAF (paroxysmal atrial fibrillation) (HCC) 12/24/2020  . 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 Essential hypertension 09/12/2020  . Anemia 09/12/2020  . Expressive aphasia 09/12/2020  . TIA (transient ischemic attack) 09/12/2020   Past Medical History:  Past Medical History:  Diagnosis Date  . Carotid artery occlusion   . GERD (gastroesophageal reflux disease)   . Hypertension   . Stroke [facility])    TIA   Past Surgical History:  Past Surgical History:  Procedure Laterality Date  . CATARACT EXTRACTION, BILATERAL    . ENDARTERECTOMY Left 09/16/2020   Procedure: LEFT ENDARTERECTOMY CAROTID;  Surgeon: Maeola Harman, MD;  Location: [facility] OR;  Service: Vascular;  Laterality: Left;  . HERNIA REPAIR    . PACEMAKER IMPLANT    . TONSILLECTOMY     HPI:  80 y.o. with pertinent PMH of recurrent TIAs status post left carotid endarterectomy 10/2020 on aspirin,  bradycardia status post pacemaker placement 2016, hypertension, GERD, recent falls who presented from ALF with symptoms of aphasia and admitted for further evaluation of suspected TIA. CCT negative for acute event.   Assessment / Plan / Recommendation Clinical Impression  Pt presents with mild, baseline deficits in working memory and higher level attention.  Speech is clear and without dysarthria; output is fluent with no aphasia, no deficits in comprehension.  Awareness and reasoning are WNL. No f/u needs identified. Reviewed BE-FAST.  Pt verbalizes understanding. SLP will sign off.    SLP Assessment  SLP Recommendation/Assessment: Patient does not need any further Speech Lanaguage Pathology  Services SLP Visit Diagnosis: Cognitive communication deficit (R41.841)    Follow Up Recommendations  None    Frequency and Duration   n/a        SLP Evaluation Cognition  Overall Cognitive Status: Within Functional Limits for tasks assessed Arousal/Alertness: Awake/alert Orientation Level: Oriented X4 Attention: Selective Selective Attention: Appears intact Memory: Impaired Memory Impairment: Retrieval deficit Awareness: Appears intact Safety/Judgment: Appears intact       Comprehension  Auditory Comprehension Overall Auditory Comprehension: Appears within functional limits for tasks assessed Visual Recognition/Discrimination Discrimination: Within Function Limits Reading Comprehension Reading Status: Within funtional limits    Expression Expression Primary Mode of Expression: Verbal Verbal Expression Overall Verbal Expression: Appears within functional limits for tasks assessed Written Expression Dominant Hand: Right   Oral / Motor  Oral Motor/Sensory Function Overall Oral Motor/Sensory Function: Within functional limits Motor Speech Overall Motor Speech: Appears within functional limits for tasks assessed   GO                    Blenda Mounts Laurice 03/12/2021, 8:45 AM  Marchelle Folks L. Samson Frederic, MA CCC/SLP Acute Rehabilitation Services Office number [phone] Pager [phone]

## 2021-03-12 NOTE — Evaluation (Signed)
Physical Therapy Evaluation & Discharge Patient Details Name: Lopaka Karge MRN: [id number] DOB: 04-06-1941 Today's Date: 03/12/2021   History of Present Illness  80 y/o male presented to ED on 4/29 with symptoms of aphasia. Admitted for further evaluation of suspected TIA. CT negative. Cannot do MRI due to pacemaker. PMH: recurrent TIAs, s/p L carotid endarterectomy 10/2020, bradycardia s/p post pacemaker placement 2016, HTN, GERD  Clinical Impression  PTA, patient lives with wife at independent living facility and reports independence with mobility. Patient functioning at modI for all mobility with no AD. Educated patient on benefits of physical activity and encouraged 60 minutes of physical activity each day. Patient and wife verbalized understanding and appreciation as they like to remain active. No further skilled PT services required acutely. No PT follow up recommended at this time. PT will sign off.     Follow Up Recommendations No PT follow up    Equipment Recommendations  None recommended by PT    Recommendations for Other Services       Precautions / Restrictions Precautions Precautions: Fall Restrictions Weight Bearing Restrictions: No      Mobility  Bed Mobility               General bed mobility comments: Pt up in recliner upon my arrival    Transfers Overall transfer level: Modified independent Equipment used: None                Ambulation/Gait Ambulation/Gait assistance: Modified independent (Device/Increase time) Gait Distance (Feet): 200 Feet Assistive device: None Gait Pattern/deviations: Step-through pattern;Decreased stride length;Decreased stance time - right   Gait velocity interpretation: >2.62 ft/sec, indicative of community ambulatory General Gait Details: Leans to the R due to scoliosis  Stairs            Wheelchair Mobility    Modified Rankin (Stroke Patients Only) Modified Rankin (Stroke Patients Only) Pre-Morbid Rankin  Score: No symptoms Modified Rankin: No symptoms     Balance Overall balance assessment: Mild deficits observed, not formally tested                                           Pertinent Vitals/Pain Pain Assessment: No/denies pain    Home Living Family/patient expects to be discharged to:: Private residence Living Arrangements: Spouse/significant other Available Help at Discharge: Family;Available 24 hours/day Type of Home: Independent living facility Home Access: Level entry     Home Layout: One level Home Equipment: Chord Takahashi - 2 wheels;Cane - single point;Grab bars - tub/shower;Hand held shower head      Prior Function Level of Independence: Independent               Hand Dominance   Dominant Hand: Right    Extremity/Trunk Assessment   Upper Extremity Assessment Upper Extremity Assessment: Overall WFL for tasks assessed    Lower Extremity Assessment Lower Extremity Assessment: Overall WFL for tasks assessed    Cervical / Trunk Assessment Cervical / Trunk Assessment: Other exceptions Cervical / Trunk Exceptions: scoliosis  Communication   Communication: No difficulties  Cognition Arousal/Alertness: Awake/alert Behavior During Therapy: WFL for tasks assessed/performed Overall Cognitive Status: Within Functional Limits for tasks assessed  General Comments      Exercises     Assessment/Plan    PT Assessment Patent does not need any further PT services  PT Problem List         PT Treatment Interventions      PT Goals (Current goals can be found in the Care Plan section)  Acute Rehab PT Goals Patient Stated Goal: to go home PT Goal Formulation: With patient    Frequency     Barriers to discharge        Co-evaluation               AM-PAC PT "6 Clicks" Mobility  Outcome Measure Help needed turning from your back to your side while in a flat bed without using  bedrails?: None Help needed moving from lying on your back to sitting on the side of a flat bed without using bedrails?: None Help needed moving to and from a bed to a chair (including a wheelchair)?: None Help needed standing up from a chair using your arms (e.g., wheelchair or bedside chair)?: None Help needed to walk in hospital room?: None Help needed climbing 3-5 steps with a railing? : None 6 Click Score: 24    End of Session   Activity Tolerance: Patient tolerated treatment well Patient left: in chair;with call bell/phone within reach;with family/visitor present Nurse Communication: Mobility status PT Visit Diagnosis: Muscle weakness (generalized) (M62.81)    Time: 2706-2376 PT Time Calculation (min) (ACUTE ONLY): 27 min   Charges:   PT Evaluation $PT Eval Low Complexity: 1 Low PT Treatments $Therapeutic Activity: 8-22 mins        Symantha Steeber A. Dan Humphreys PT, DPT Acute Rehabilitation Services Pager [phone] Office [phone]   Viviann Spare 03/12/2021, 4:40 PM

## 2021-03-13 ENCOUNTER — Inpatient Hospital Stay (HOSPITAL_COMMUNITY): Payer: Medicare Other

## 2021-03-13 DIAGNOSIS — G459 Transient cerebral ischemic attack, unspecified: Secondary | ICD-10-CM

## 2021-03-13 DIAGNOSIS — I480 Paroxysmal atrial fibrillation: Secondary | ICD-10-CM | POA: Diagnosis not present

## 2021-03-13 DIAGNOSIS — I10 Essential (primary) hypertension: Secondary | ICD-10-CM | POA: Diagnosis not present

## 2021-03-13 DIAGNOSIS — Z950 Presence of cardiac pacemaker: Secondary | ICD-10-CM

## 2021-03-13 DIAGNOSIS — N179 Acute kidney failure, unspecified: Secondary | ICD-10-CM | POA: Diagnosis not present

## 2021-03-13 DIAGNOSIS — R4701 Aphasia: Secondary | ICD-10-CM | POA: Diagnosis not present

## 2021-03-13 LAB — BASIC METABOLIC PANEL
Anion gap: 6 (ref 5–15)
BUN: 15 mg/dL (ref 8–23)
CO2: 25 mmol/L (ref 22–32)
Calcium: 8.4 mg/dL — ABNORMAL LOW (ref 8.9–10.3)
Chloride: 107 mmol/L (ref 98–111)
Creatinine, Ser: 0.96 mg/dL (ref 0.61–1.24)
GFR, Estimated: >60 mL/min (ref >60)
Glucose, Bld: 89 mg/dL (ref 70–99)
Potassium: 4.1 mmol/L (ref 3.5–5.1)
Sodium: 138 mmol/L (ref 135–145)

## 2021-03-13 LAB — CBC
HCT: 33.5 % — ABNORMAL LOW (ref 39.0–52.0)
Hemoglobin: 11.8 g/dL — ABNORMAL LOW (ref 13.0–17.0)
MCH: 33.7 pg (ref 26.0–34.0)
MCHC: 35.2 g/dL (ref 30.0–36.0)
MCV: 95.7 fL (ref 80.0–100.0)
Platelets: 152 10*3/uL (ref 150–400)
RBC: 3.5 MIL/uL — ABNORMAL LOW (ref 4.22–5.81)
RDW: 13.7 % (ref 11.5–15.5)
WBC: 5.8 10*3/uL (ref 4.0–10.5)
nRBC: 0 % (ref 0.0–0.2)

## 2021-03-13 LAB — ECHOCARDIOGRAM COMPLETE
Area-P 1/2: 3.42 cm2
Height: 70 in
P 1/2 time: 546 msec
S' Lateral: 3.1 cm
Weight: 2645.52 oz

## 2021-03-13 NOTE — Progress Notes (Signed)
  Echocardiogram 2D Echocardiogram has been performed.  Pieter Partridge 03/13/2021, 9:23 AM

## 2021-03-13 NOTE — Progress Notes (Signed)
Patient ready for discharge to home; discharge instructions given and reviewed; patient stroke education completed with teach back; patient discharged out via wheelchair; accompanied home by his wife.

## 2021-03-13 NOTE — Discharge Summary (Signed)
Name: [name] MRN: [id number] DOB: 1941-10-08 80 y.o. PCP: Patient, No Pcp Per (Inactive)  Date of Admission: 03/11/2021  5:14 PM Date of Discharge: 03/13/2021 Attending Physician: Tyson Alias, *  Subjective: Patient evaluated at bedside this AM. Reports he's doing well, no acute events overnight. Denies further episodes of aphasia, chest pain, dyspnea, abdominal pain. Mentions his PCP is near his retirement home and can follow-up with them this week.  Discharge Diagnosis: 1. Recrudescence of expressive aphasia d/t hypotension 2. 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   apixaban 5 MG Tabs tablet Commonly known as: ELIQUIS Take 1 tablet (5 mg total) by mouth 2 (two) times daily.   Arnuity Ellipta 200 MCG/ACT Aepb Generic drug: Fluticasone Furoate Inhale 1 puff into the lungs every morning.   aspirin EC 81 MG tablet Take 1 tablet (81 mg total) by mouth daily. Swallow whole.   atorvastatin 40 MG tablet Commonly known as: LIPITOR Take 1 tablet (40 mg total) by mouth daily.   cholecalciferol 25 MCG (1000 UNIT) tablet Commonly known as: VITAMIN D Take 1,000 Units by mouth at bedtime.   FISH OIL PO Take 1 capsule by mouth every morning.   Iron 325 (65 Fe) MG Tabs Take 1 tablet by mouth at bedtime.   Magnesium 250 MG Tabs Take 250 mg by mouth every morning.   multivitamin with minerals Tabs tablet Take 1 tablet by mouth every morning.   omeprazole 40 MG capsule Commonly known as:  PRILOSEC Take 40 mg by mouth daily before supper.      Disposition and follow-up:   [name] was discharged from [facility] in Stable condition.  At the hospital follow up visit please address:  1. Recrudescence of expressive aphasia d/t hypotension: Holding BP medications in setting of hypotension. Can consider more liberal BP goal, slow up-titration of medications.  2.Atrial flutter, sinus bradycardia w/ PPM : During hospitalization has been in normal sinus rhythm off metoprolol. Can consider low-dose vs continuing to hold.  3.  Labs / imaging needed at time of follow-up: CBC, BMP  4.  Pending labs/ test needing follow-up: n/a  Follow-up Appointments:    Follow-up Information    PCP. Schedule an appointment as soon as possible for a visit.   Why: Please make an appointment with your primary care physician within the next week.       [facility]. Schedule an appointment as soon as possible for a visit in 1 week(s).   Contact information: [address]     [address] [place] [address] [phone]             Hospital Course by problem list: 1. Recrudescence of expressive aphasia d/t hypotension: Patient with history of recurrent aphasia s/p LCEA presented to the emergency department after experiencing dizziness and difficulty finding words while eating lunch with friends. Patient has had multiple previous episodes, thought to be TIA's, although MRI has not been performed for confirmation.  Upon arrival of EMS, patient found to be hypotensive. IVF started and upon arrival to ED his symptoms had resolved. Stroke team consulted for further evaluation. CT and CTA overall unremarkable, no acute changes or signs of stenosis. TTE without acute changes, EF 60-65%, no RWMA, normal LA size, no evidence of thrombus. MRI was unable to be performed as his pacemaker is incompatible. Etiology of hypotension most likely due to  recent increase in metoprolol dosing, no recent illness, current signs of infection, or decreased po intake. Throughout hospitalization, metoprolol and lisinopril held with blood pressure at goal. Will discharge continuing to hold blood pressure medications. Otherwise will continue home atorvastatin, aspirin. A1c also at goal 5.6. Discussed with patient importance of follow-up with primary care physician this week to further evaluate need for blood pressure medications. Patient to also follow-up with neurology.  2. Atrial flutter, sinus bradycardia w/ PPM: Patient has not had recent episodes of atrial flutter, has been in normal sinus rhythm throughout hospitalization with HR in 60's. Unlikely etiology of patient's presentation is cardiac. Will continue holding metoprolol at discharge due to hypotension. Can consider re-starting metoprolol at lower dose in outpatient setting. Will continue with Eliquis, aspirin.   3. AKI on CKDII: Baseline creatinine 1.0-1.1. On arrival to hospital, patient's sCr 1.6. Patient given IVF with improvement of kidney function. Will need to follow-up lab work with primary care physician later this week.  Discharge Exam:   BP (!) 137/54 (BP Location: Left Arm)   Pulse 70   Temp 98.7 F (37.1 C) (Oral)   Resp 18   Ht 5\' 10"  (1.778 m)   Wt 75 kg   SpO2 98%   BMI 23.72 kg/m  General: Laying in bed, no acute distress CV: Regular rate, rhythm. No m/r/g appreciated. Pulm: Normal work of breathing, clear to auscultation bilaterally. GI: Soft, non-distended, non-tender. Normoactive bowel sounds. Neuro: Awake, alert, oriented x4. CN in tact. Motor 5/5 throughout, sensation in tact throughout. Finger to nose normal.  Pertinent Labs, Studies, and Procedures:  CBC Latest Ref Rng & Units 03/13/2021 03/11/2021 03/11/2021  WBC 4.0 - 10.5 K/uL 5.8 - 4.9  Hemoglobin 13.0 - 17.0 g/dL 11.8(L) 11.9(L) 12.8(L)  Hematocrit 39.0 - 52.0 % 33.5(L) 35.0(L) 37.3(L)  Platelets 150 - 400 K/uL  152 - 166   BMP Latest Ref Rng & Units 03/13/2021 03/12/2021 03/11/2021  Glucose 70 - 99 mg/dL 89 03/13/2021) 578(I)  BUN 8 - 23 mg/dL 15 23 696(E)  Creatinine 0.61 - 1.24 mg/dL 95(M 8.41 3.24)  Sodium 135 - 145 mmol/L 138 138 138  Potassium 3.5 - 5.1 mmol/L 4.1 4.1 4.3  Chloride 98 - 111 mmol/L 107 105 105  CO2 22 - 32 mmol/L 25 25 -  Calcium 8.9 - 10.3 mg/dL 4.01(U) 2.7(O) -   5.3(G 5.6 TSH 0.391  CT Head 4/29: No acute finding by CT. Atrophy and chronic small-vessel ischemic changes.  CTA Head/Neck 4/29: No large vessel occlusion, hemodynamically significant stenosis, or evidence of dissection. Status post left carotid end arterectomy. Calcified plaque in the right carotid bifurcation without hemodynamically significant stenosis. Enlarged and heterogeneous right thyroid. Aortic atherosclerosis.  TTE 5/1: EF 60-65%, no RWMA, normal LA size, no evidence of thrombus  Discharge Instructions:  Mr. Newbury, I am glad you are feeling better and are able to be discharged today! We believe your symptoms were due to low blood pressure caused by your medications. We have been holding your blood pressure medications while you've been hospitalized and your blood pressure  has looked good. We would like for you to continue holding these medications until you can see your primary care doctor. Please make an appointment to see them this week. Please see the following notes:  STOP taking: Lisinopril (Zestiril) 2.5mg  Metoprolol tartrate (Lopressor) 50mg   Continue taking the rest of your medications, including atorvastatin, aspirin, and Eliquis.  Please make sure to make appointments with your primary care doctor as well as the neurologist.  It was a pleasure meeting you, Mr. Ogren. I wish you the best and hope you stay happy and healthy!  Thank you, Arville Care, MD  Signed: Evlyn Kanner, MD 03/13/2021, 9:12 AM   Pager: [phone]

## 2021-03-13 NOTE — Progress Notes (Signed)
STROKE TEAM PROGRESS NOTE   INTERVAL HISTORY His wife is at the bedside.  Pt is washing up for being discharged. He feels good and BP stable. Answered wife's questions. Cre improved with IVF. Encourage adequate po intake.    OBJECTIVE Vitals:   03/13/21 0455 03/13/21 0745 03/13/21 0755 03/13/21 1233  BP: 130/64 (!) 134/57 (!) 137/54 (!) 141/73  Pulse: 64 65 70 61  Resp: 18 18 18 18   Temp: 97.8 F (36.6 C) 98.4 F (36.9 C) 98.7 F (37.1 C) 98 F (36.7 C)  TempSrc: Oral Oral Oral   SpO2: 98% 100% 98% 100%  Weight:      Height:        CBC:  Recent Labs  Lab 03/11/21 1717 03/11/21 1723 03/13/21 0547  WBC 4.9  --  5.8  NEUTROABS 2.8  --   --   HGB 12.8* 11.9* 11.8*  HCT 37.3* 35.0* 33.5*  MCV 97.6  --  95.7  PLT 166  --  152    Basic Metabolic Panel:  Recent Labs  Lab 03/12/21 0242 03/13/21 0547  NA 138 138  K 4.1 4.1  CL 105 107  CO2 25 25  GLUCOSE 110* 89  BUN 23 15  CREATININE 1.10 0.96  CALCIUM 8.5* 8.4*    Lipid Panel:     Component Value Date/Time   CHOL 78 03/12/2021 0242   TRIG 68 03/12/2021 0242   HDL 33 (L) 03/12/2021 0242   CHOLHDL 2.4 03/12/2021 0242   VLDL 14 03/12/2021 0242   LDLCALC 31 03/12/2021 0242   HgbA1c:  Lab Results  Component Value Date   HGBA1C 5.6 03/12/2021   Urine Drug Screen:     Component Value Date/Time   LABOPIA NONE DETECTED 03/11/2021 2315   COCAINSCRNUR NONE DETECTED 03/11/2021 2315   LABBENZ NONE DETECTED 03/11/2021 2315   AMPHETMU NONE DETECTED 03/11/2021 2315   THCU NONE DETECTED 03/11/2021 2315   LABBARB NONE DETECTED 03/11/2021 2315    Alcohol Level     Component Value Date/Time   ETH <10 03/11/2021 1717    IMAGING  CT HEAD CODE STROKE WO CONTRAST Result Date: 03/11/2021 CLINICAL DATA:  Code stroke. Acute neurological deficit. Stroke suspected. EXAM: CT HEAD WITHOUT CONTRAST TECHNIQUE: Contiguous axial images were obtained from the base of the skull through the vertex without intravenous contrast.  COMPARISON:  12/24/2020 FINDINGS: Brain: Generalized atrophy. Chronic small-vessel ischemic changes affect pons. No focal cerebellar insult. Cerebral hemispheres show chronic small-vessel ischemic changes of the white matter. No sign of old or acute cortical or subcortical infarction. No mass lesion, hemorrhage, hydrocephalus or extra-axial collection. Vascular: There is atherosclerotic calcification of the major vessels at the base of the brain. Skull: Negative Sinuses/Orbits: Clear/normal Other: None ASPECTS (Alberta Stroke Program Early CT Score). Clinical insult side not described. However, the calculation would be the same. - Ganglionic level infarction (caudate, lentiform nuclei, internal capsule, insula, M1-M3 cortex): 7 - Supraganglionic infarction (M4-M6 cortex): 3 Total score (0-10 with 10 being normal): 10 IMPRESSION: 1. No acute finding by CT. Atrophy and chronic small-vessel ischemic changes. 2. ASPECTS is 10. Clinical insult side not described. However, the calculation would be the same. 3. These results were communicated to Dr. 02/21/2021 At 5:28 pmon 4/29/2022by text page via the [facility] messaging system. Electronically Signed   By: [facility] M.D.   On: 03/11/2021 17:30   CT ANGIO HEAD CODE STROKE Result Date: 03/11/2021 CLINICAL DATA:  Neuro deficit, acute, stroke suspected. EXAM: CT ANGIOGRAPHY HEAD  AND NECK TECHNIQUE: Multidetector CT imaging of the head and neck was performed using the standard protocol during bolus administration of intravenous contrast. Multiplanar CT image reconstructions and MIPs were obtained to evaluate the vascular anatomy. Carotid stenosis measurements (when applicable) are obtained utilizing NASCET criteria, using the distal internal carotid diameter as the denominator. CONTRAST:  52mL OMNIPAQUE IOHEXOL 350 MG/ML SOLN COMPARISON:  Head CT March 11, 2021. FINDINGS: CTA NECK FINDINGS Aortic arch: Common origin of the innominate and left common carotid artery from the aortic  arch. Imaged portion shows no evidence of aneurysm or dissection. Calcified plaques in the aortic arch and at the origin of the left subclavian artery. No significant stenosis of the major arch vessel origins. Right carotid system: Calcified plaque in the right carotid bifurcation without hemodynamically significant stenosis. Left carotid system: Status post end arterectomy. No evidence of stenosis, dissection or occlusion. Vertebral arteries: Left dominant. No evidence of dissection, stenosis (50% or greater) or occlusion. Skeleton: Degenerative changes of the cervical spine. No aggressive bone lesion identified Other neck: Enlarged and heterogeneous right thyroid Upper chest: Negative. Review of the MIP images confirms the above findings CTA HEAD FINDINGS Anterior circulation: Calcified plaques in the bilateral carotid siphons. No significant stenosis, proximal occlusion, aneurysm, or vascular malformation. Posterior circulation: Calcified plaque in the V4 segment of the left vertebral artery. No significant stenosis, proximal occlusion, aneurysm, or vascular malformation. Venous sinuses: As permitted by contrast timing, patent. Anatomic variants: None Review of the MIP images confirms the above findings IMPRESSION: 1. No large vessel occlusion, hemodynamically significant stenosis, or evidence of dissection. 2. Status post left carotid end arterectomy. 3. Calcified plaque in the right carotid bifurcation without hemodynamically significant stenosis. 4. Enlarged and heterogeneous right thyroid. 5. Aortic atherosclerosis. Aortic Atherosclerosis (ICD10-I70.0). Electronically Signed   By: Baldemar Lenis M.D.   On: 03/11/2021 17:56   CT ANGIO NECK CODE STROKE Result Date: 03/11/2021 CLINICAL DATA:  Neuro deficit, acute, stroke suspected. EXAM: CT ANGIOGRAPHY HEAD AND NECK TECHNIQUE: Multidetector CT imaging of the head and neck was performed using the standard protocol during bolus administration of  intravenous contrast. Multiplanar CT image reconstructions and MIPs were obtained to evaluate the vascular anatomy. Carotid stenosis measurements (when applicable) are obtained utilizing NASCET criteria, using the distal internal carotid diameter as the denominator. CONTRAST:  77mL OMNIPAQUE IOHEXOL 350 MG/ML SOLN COMPARISON:  Head CT March 11, 2021. FINDINGS: CTA NECK FINDINGS Aortic arch: Common origin of the innominate and left common carotid artery from the aortic arch. Imaged portion shows no evidence of aneurysm or dissection. Calcified plaques in the aortic arch and at the origin of the left subclavian artery. No significant stenosis of the major arch vessel origins. Right carotid system: Calcified plaque in the right carotid bifurcation without hemodynamically significant stenosis. Left carotid system: Status post end arterectomy. No evidence of stenosis, dissection or occlusion. Vertebral arteries: Left dominant. No evidence of dissection, stenosis (50% or greater) or occlusion. Skeleton: Degenerative changes of the cervical spine. No aggressive bone lesion identified Other neck: Enlarged and heterogeneous right thyroid Upper chest: Negative. Review of the MIP images confirms the above findings CTA HEAD FINDINGS Anterior circulation: Calcified plaques in the bilateral carotid siphons. No significant stenosis, proximal occlusion, aneurysm, or vascular malformation. Posterior circulation: Calcified plaque in the V4 segment of the left vertebral artery. No significant stenosis, proximal occlusion, aneurysm, or vascular malformation. Venous sinuses: As permitted by contrast timing, patent. Anatomic variants: None Review of the MIP images confirms the  above findings IMPRESSION: 1. No large vessel occlusion, hemodynamically significant stenosis, or evidence of dissection. 2. Status post left carotid end arterectomy. 3. Calcified plaque in the right carotid bifurcation without hemodynamically significant stenosis.  4. Enlarged and heterogeneous right thyroid. 5. Aortic atherosclerosis. Aortic Atherosclerosis (ICD10-I70.0). Electronically Signed   By: Baldemar LenisKatyucia  De Macedo Rodrigues M.D.   On: 03/11/2021 17:56   CT HEAD WITHOUT CONTRAST 03/12/2021 TECHNIQUE: Contiguous axial images were obtained from the base of the skull through the vertex without intravenous contrast. COMPARISON:  CT studies yesterday. FINDINGS: Brain: No focal abnormality of the brainstem or cerebellum. Cerebral hemispheres show chronic small-vessel ischemic changes of the white matter. No sign of acute infarction, mass lesion, hemorrhage, hydrocephalus or extra-axial collection. Vascular: There is atherosclerotic calcification of the major vessels at the base of the brain. Skull: Negative Sinuses/Orbits: Clear/normal Other: None IMPRESSION: No change since yesterday. Chronic small-vessel ischemic changes of the cerebral hemispheric white matter.   Transthoracic Echocardiogram  IMPRESSIONS  1. Left ventricular ejection fraction, by estimation, is 60 to 65%. The  left ventricle has normal function. The left ventricle has no regional  wall motion abnormalities. There is moderate concentric left ventricular  hypertrophy. Left ventricular  diastolic parameters are indeterminate.  2. Right ventricular systolic function is normal. The right ventricular  size is normal. There is normal pulmonary artery systolic pressure. The  estimated right ventricular systolic pressure is 31.9 mmHg.  3. The mitral valve is normal in structure. Trivial mitral valve  regurgitation. No evidence of mitral stenosis.  4. The aortic valve is normal in structure. Aortic valve regurgitation is  mild. No aortic stenosis is present.  5. The inferior vena cava is normal in size with greater than 50%  respiratory variability, suggesting right atrial pressure of 3 mmHg.    ECG - SR rate 62 BPM. (See cardiology reading for complete details  EEG This  study is within normal limits. No seizures or epileptiform discharges were seen throughout the recording. The excessive beta activity seen in the background is most likely due to the effect of medications like benzodiazepine and is a benign EEG pattern.  PHYSICAL EXAM  Temp:  [97.5 F (36.4 C)-98.7 F (37.1 C)] 98 F (36.7 C) (05/01 1233) Pulse Rate:  [59-74] 61 (05/01 1233) Resp:  [18] 18 (05/01 1233) BP: (119-155)/(50-77) 141/73 (05/01 1233) SpO2:  [95 %-100 %] 100 % (05/01 1233)  General - Well nourished, well developed, in no apparent distress.  Ophthalmologic - fundi not visualized due to noncooperation.  Cardiovascular - Regular rhythm and rate.  Mental Status -  Level of arousal and orientation to time, place, and person were intact. Language including expression, naming, repetition, comprehension was assessed and found intact.  Cranial Nerves II - XII - II - Visual field intact OU. III, IV, VI - Extraocular movements intact. V - Facial sensation intact bilaterally. VII - Facial movement intact bilaterally. VIII - Hearing & vestibular intact bilaterally. X - Palate elevates symmetrically. XI - Chin turning & shoulder shrug intact bilaterally. XII - Tongue protrusion intact.  Motor Strength - The patient's strength was normal in all extremities and pronator drift was absent.  Bulk was normal and fasciculations were absent.   Motor Tone - Muscle tone was assessed at the neck and appendages and was normal.  Reflexes - The patient's reflexes were symmetrical in all extremities and he had no pathological reflexes.  Sensory - Light touch, temperature/pinprick were assessed and were symmetrical.    Coordination -  The patient had normal movements in the hands with no ataxia or dysmetria.  Tremor was absent.  Gait and Station - deferred.   ASSESSMENT/PLAN Mr. Avram Danielson is a 80 y.o. 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  He did not receive IV t-PA due to Eliquis.   Recrudescence of stroke symptoms in the setting of hypotension and AKI   CT Head - No acute finding by CT.   CTA H&N - Status post left carotid end arterectomy.   MRI head - PPM - no MRI  CT head - 03/12/21 - No change since yesterday.   2D Echo - EF 60 - 65%. No cardiac source of emboli identified.   EEG normal  [employer] Virus 2 - negative  LDL - 31  HgbA1c - 5.6   UDS - negative  VTE prophylaxis - Eliquis  aspirin 81 mg daily and Eliquis (apixaban) daily prior to admission, now on aspirin 81 mg daily and Eliquis (apixaban) daily  Patient will be counseled to be compliant with his antithrombotic medications  Ongoing aggressive stroke risk factor management  Therapy recommendations: none  Disposition:  Pending  Episode of hypotension Hx of hypertension  Home BP meds: Zestril ; metoprolol  Current BP meds: none   Stable  . Avoid low BP . Orthostatic vital - negative . Close home BP monitoring and close PCP follow-up . Long-term BP goal normotensive  History of stroke  09/2020 admitted for episode of word finding difficulty.  Given stereotactic episodes, concerning for high risk plaque at left ICA bulb.  Discussed with Dr. Arbie Cookey, status post left CEA.  Discharged with aspirin and Eliquis.  12/2020, admitted again for episode of word finding difficulty.  Dr. Pearlean Brownie concerning for near syncope.  Chronic A. Fib  Rate controlled  On Eliquis  Continue on discharge  AKI  Baseline creatinine 1.02  This admission cre 1.6  Improved after IV fluid, currently Cre 1.10->0.96  Encourage p.o. intake, avoid dehydration  Hyperlipidemia  Home Lipid  lowering medication: Lipitor 40 mg daily   LDL 31, goal < 70  Current lipid lowering medication: Lipitor 40 mg daily   Continue statin at discharge  Other Stroke Risk Factors  Advanced age  Former cigarette smoker - quit  ETOH use, advised to drink no more than 1 alcoholic beverage per day.  Family hx stroke - not on file  Bradycardia status post pacemaker 2016  Other Active Problems  Code status - Full code  Hospital day # 2  Neurology will sign off. Please call with questions. Pt will follow up with stroke clinic NP at [facility] on 04/26/21. Thanks for the consult.  Marvel Plan, MD PhD Stroke Neurology 03/13/2021 6:11 PM     To contact Stroke Continuity provider, please refer to [email or web address]. After hours, contact General Neurology

## 2021-03-14 ENCOUNTER — Telehealth: Payer: Self-pay | Admitting: Cardiology

## 2021-03-14 NOTE — Telephone Encounter (Signed)
Spoke with pt and advised current BP is WNL.  Pt denies any current symptoms. Pt states he was advised upon hospital discharge to avoid strenuous activity for the next few days. Pt advised if he feels up to it he can certainly go to dinner at his assisted living facility.  Pt reports his appetite is good and he is staying well hydrated.  He states he has an appointment for f/u with his PCP tomorrow afternoon.  Pt advised to discuss activity restrictions with PCP during appt tomorrow 05/03.  Pt will continue to monitor BP and take medications as prescribed. Appointment scheduled with Dr Lalla Brothers on 05/06/2021 at 330pm.  Pt verbalizes understanding and thanked RN for the call.

## 2021-03-14 NOTE — Telephone Encounter (Signed)
New Message:    Pt had a TIA on 03-11-21. His blood pressure medicie was stopped  Today his pressure was 125/68,  early this morning  and went up later to 126/72  lunch now it is 120/65 again. Wife wants to know if he can do his normal activities today, like going down in the [facility] for [employer]. Nosymptoms with these readings.oblems today.    Pt c/o BP issue: STAT if pt c/o blurred vision, one-sided weakness or slurred speech  1. What are your last 5 BP readings? [id number], 120/65  2. Are you having any other symptoms (ex. Dizziness, headache, blurred vision, passed out)? no  3. What is your BP issue?  Wants to know about what activities he can do and wants to know if he is in danger of having another stroke with these readings

## 2021-03-21 ENCOUNTER — Telehealth: Payer: Self-pay

## 2021-03-21 NOTE — Telephone Encounter (Signed)
[name] DOB 07/25/1943 and her husband [name] were referred to establish care with Dr. [place] by Carron Brazen.  Can I schedule them?

## 2021-03-22 NOTE — Telephone Encounter (Signed)
It is ok to schedule new pt appt. Thanks, BJ 

## 2021-03-22 NOTE — Telephone Encounter (Signed)
Patient and his wife are both scheduled

## 2021-03-29 ENCOUNTER — Ambulatory Visit (INDEPENDENT_AMBULATORY_CARE_PROVIDER_SITE_OTHER): Payer: Medicare Other

## 2021-03-29 DIAGNOSIS — I480 Paroxysmal atrial fibrillation: Secondary | ICD-10-CM

## 2021-03-29 LAB — CUP PACEART REMOTE DEVICE CHECK
Battery Remaining Longevity: 118 mo
Battery Remaining Percentage: 95.5 %
Battery Voltage: 2.96 V
Brady Statistic AP VP Percent: 8.3 %
Brady Statistic AP VS Percent: 90 %
Brady Statistic AS VP Percent: 1 %
Brady Statistic AS VS Percent: 1 %
Brady Statistic RA Percent Paced: 96 %
Brady Statistic RV Percent Paced: 8.3 %
Date Time Interrogation Session: 20220517020014
Implantable Lead Implant Date: 20160928
Implantable Lead Implant Date: 20160928
Implantable Lead Location: 753859
Implantable Lead Location: 753860
Implantable Lead Model: 1948
Implantable Pulse Generator Implant Date: 20160928
Lead Channel Impedance Value: 410 Ohm
Lead Channel Impedance Value: 580 Ohm
Lead Channel Pacing Threshold Amplitude: 0.625 V
Lead Channel Pacing Threshold Amplitude: 0.875 V
Lead Channel Pacing Threshold Pulse Width: 0.4 ms
Lead Channel Pacing Threshold Pulse Width: 0.4 ms
Lead Channel Sensing Intrinsic Amplitude: 1.4 mV
Lead Channel Sensing Intrinsic Amplitude: 2.4 mV
Lead Channel Setting Pacing Amplitude: 1.125
Lead Channel Setting Pacing Amplitude: 1.625
Lead Channel Setting Pacing Pulse Width: 0.4 ms
Lead Channel Setting Sensing Sensitivity: 0.7 mV
Pulse Gen Model: 2240
Pulse Gen Serial Number: 7817736

## 2021-04-05 ENCOUNTER — Other Ambulatory Visit: Payer: Self-pay

## 2021-04-05 ENCOUNTER — Ambulatory Visit (INDEPENDENT_AMBULATORY_CARE_PROVIDER_SITE_OTHER): Payer: Medicare Other | Admitting: Family Medicine

## 2021-04-05 ENCOUNTER — Encounter: Payer: Self-pay | Admitting: Family Medicine

## 2021-04-05 ENCOUNTER — Ambulatory Visit (INDEPENDENT_AMBULATORY_CARE_PROVIDER_SITE_OTHER)
Admission: RE | Admit: 2021-04-05 | Discharge: 2021-04-05 | Disposition: A | Discharge status: Home / Self Care | Payer: Medicare Other | Source: Ambulatory Visit | Attending: Family Medicine | Admitting: Family Medicine

## 2021-04-05 VITALS — BP 120/60 | HR 77 | Resp 16 | Ht 70.0 in | Wt 170.0 lb

## 2021-04-05 DIAGNOSIS — G459 Transient cerebral ischemic attack, unspecified: Secondary | ICD-10-CM

## 2021-04-05 DIAGNOSIS — M79671 Pain in right foot: Secondary | ICD-10-CM

## 2021-04-05 DIAGNOSIS — I10 Essential (primary) hypertension: Secondary | ICD-10-CM

## 2021-04-05 DIAGNOSIS — I739 Peripheral vascular disease, unspecified: Secondary | ICD-10-CM

## 2021-04-05 DIAGNOSIS — I5032 Chronic diastolic (congestive) heart failure: Secondary | ICD-10-CM

## 2021-04-05 DIAGNOSIS — I700 Atherosclerosis of aorta: Secondary | ICD-10-CM | POA: Insufficient documentation

## 2021-04-05 MED ORDER — LISINOPRIL 2.5 MG PO TABS: 2.5000 mg | ORAL_TABLET | Freq: Every day | ORAL | 1 refills | Status: DC

## 2021-04-05 NOTE — Patient Instructions (Addendum)
A few things to remember from today's visit:   TIA (transient ischemic attack)  Essential hypertension - Plan: lisinopril (ZESTRIL) 2.5 MG tablet  Foot pain, right - Plan: DG Foot Complete Right  If you need refills please call your pharmacy. Do not use My Chart to request refills or for acute issues that need immediate attention.   Check feet regularly. X ray of right foot today. Monitor blood pressure. Lisinopril 2.5 mg added today. Lab in 7-10 days.  Please be sure medication list is accurate. If a new problem present, please set up appointment sooner than planned today.

## 2021-04-05 NOTE — Progress Notes (Signed)
HPI: [name] is a very pleasant 80 y.o. male, who is here today with his wife to establish care.  Former PCP: At [facility]  Last preventive routine visit: Within the past year. Moved from [place] in 09/2020. Lives in independent living, [place].  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  Concerns today: TIA Reporting 4 TIA in the past 6-7 months. Hospitalized in 12/2020 and 02/2021.  He is on Aspirin 81 mg daily, Eliquis 5 mg twice daily, and atorvastatin 40 mg daily. His wife is concerned because he is still driving.  He follows with neurologist, last seen on 01/06/2021.  Echo on 03/13/2021: LVEF 60 to 65%, no regional wall motion abnormalities.  There is moderate concentric left ventricular hypertrophy.  Left ventricular diastolic parameters are indeterminate. Echo on 12/25/2020 showed grade 1 diastolic dysfunction. Negative for orthopnea and PND.  Abdominal U/S 05/01/17 Mild scattered atherosclerosis with no evidence of abdominal aortic aneurysm   Lab Results  Component Value Date   CHOL 78 03/12/2021   HDL 33 (L) 03/12/2021   LDLCALC 31 03/12/2021   TRIG 68 03/12/2021   CHOLHDL 2.4 03/12/2021   Lab Results  Component Value Date   WBC 5.8 03/13/2021   HGB 11.8 (L) 03/13/2021   HCT 33.5 (L) 03/13/2021   MCV 95.7 03/13/2021   PLT 152 03/13/2021   HTN: He was on metoprolol 25 mg twice daily.  Lisinopril 2.5 mg was added at some point but he did not start it. Antihypertensive medication was discontinued during recent hospitalization. BP readings at home:140-150's/60-70's, a few SBP 130's. HR 60's, occasionally high 50's and 70's.. Negative for severe/frequent headache, visual changes, chest pain, dyspnea, palpitation, focal weakness,  or worsening edema.  Lab Results  Component Value Date   CREATININE 0.96 03/13/2021   BUN 15 03/13/2021   NA 138 03/13/2021   K 4.1 03/13/2021   CL 107 03/13/2021   CO2 25 03/13/2021   Also c/o right food edema and erythema. He noted problem when he woke up in the morning on 03/24/21. There is no significant pain,"little." He does not remember injuries but has some bruises and hematomas on upper extremities, working on her daughter's house. In the ER after a fall on 02/22/21.  No known hx of neuropathy but states that he feels like he has soaks on all the time, this has been going on for years. There is no numbness or tingling. No pruritus, hx of insect bite,or outdoors exposure. He has not noted fever or changes in appetite.  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 Gait is not assisted, moderate scoliosis.  Review of Systems  Constitutional: Negative for activity change and chills.  HENT: Negative for nosebleeds and sore throat.   Respiratory: Negative for cough and wheezing.   Gastrointestinal: Negative for abdominal pain, nausea and vomiting.  Genitourinary: Negative for decreased urine volume, dysuria and hematuria.  Musculoskeletal: Positive for arthralgias, back pain and gait problem.  Skin: Negative for wound.  Neurological: Negative for syncope and facial asymmetry.  Psychiatric/Behavioral: Negative for confusion.  Rest see pertinent positives and negatives per HPI.  Current Outpatient Medications on File Prior to Visit  Medication Sig Dispense Refill  . albuterol (VENTOLIN HFA) 108 (90 Base) MCG/ACT inhaler Inhale 2 puffs into the lungs 4 (four) times daily as needed for wheezing  or shortness of breath.    Marland Kitchen apixaban (ELIQUIS) 5 MG TABS tablet Take 1 tablet (5 mg total) by mouth 2 (two) times daily. 60 tablet 1  . aspirin EC 81 MG tablet Take 1 tablet (81 mg total) by mouth daily. Swallow whole. 30 tablet 1  . atorvastatin (LIPITOR) 40 MG tablet Take 1  tablet (40 mg total) by mouth daily. 30 tablet 1  . cholecalciferol (VITAMIN D) 25 MCG (1000 UNIT) tablet Take 1,000 Units by mouth at bedtime.    . Ferrous Sulfate (IRON) 325 (65 Fe) MG TABS Take 1 tablet by mouth at bedtime.    . Fluticasone Furoate (ARNUITY ELLIPTA) 200 MCG/ACT AEPB Inhale 1 puff into the lungs every morning.    . Magnesium 250 MG TABS Take 250 mg by mouth every morning.    . Multiple Vitamin (MULTIVITAMIN WITH MINERALS) TABS tablet Take 1 tablet by mouth every morning.    . Omega-3 Fatty Acids (FISH OIL PO) Take 1 capsule by mouth every morning.    Marland Kitchen omeprazole (PRILOSEC) 40 MG capsule Take 40 mg by mouth daily before supper.     No current facility-administered medications on file prior to visit.   Past Medical History:  Diagnosis Date  . Carotid artery occlusion   . GERD (gastroesophageal reflux disease)   . Hypertension   . Stroke [facility])    TIA   No Known Allergies  Family History  Family history unknown: Yes    Social History   Socioeconomic History  . Marital status: Married    Spouse name: Not on file  . Number of children: Not on file  . Years of education: Not on file  . Highest education level: Not on file  Occupational History  . Not on file  Tobacco Use  . Smoking status: Former Games developer  . Smokeless tobacco: Never Used  Vaping Use  . Vaping Use: Never used  Substance and Sexual Activity  . Alcohol use: Yes    Comment: occassional  . Drug use: Not on file  . Sexual activity: Not Currently  Other Topics Concern  . Not on file  Social History Narrative  . Not on file   Social Determinants of Health   Financial Resource Strain: Not on file  Food Insecurity: Not on file  Transportation Needs: Not on file  Physical Activity: Not on file  Stress: Not on file  Social Connections: Not on file   Vitals:   04/05/21 1354  BP: 120/60  Pulse: 77  Resp: 16  SpO2: 97%   Body mass index is 24.39 kg/m.  Physical Exam Nursing note  reviewed.  Constitutional:      General: He is not in acute distress.    Appearance: He is well-developed.  HENT:     Head: Normocephalic and atraumatic.     Mouth/Throat:     Mouth: Mucous membranes are moist.     Pharynx: Oropharynx is clear.  Eyes:     Conjunctiva/sclera: Conjunctivae normal.  Cardiovascular:     Rate and Rhythm: Normal rate and regular rhythm.     Pulses:          Dorsalis pedis pulses are 2+ on the right side and 2+ on the left side.     Heart sounds: Murmur (SEM I/VI LUSB) heard.      Comments: Trace pitting LE edema, bilateral. Pulmonary:     Effort: Pulmonary effort is normal. No respiratory distress.     Breath sounds: Normal breath  sounds.  Abdominal:     Palpations: Abdomen is soft. There is no hepatomegaly or mass.     Tenderness: There is no abdominal tenderness.  Musculoskeletal:     Thoracic back: Scoliosis present.     Right lower leg: Edema present.     Left lower leg: Edema present.     Right foot: Swelling present. No tenderness or bony tenderness. Normal pulse.     Left foot: Normal pulse.       Feet:     Comments: Pronounce arch and IP deformities ,bilateral. There is ecchymosis on distal phalange 2nd right toe and tarsus (between 3-4th). No tenderness upon palpation and no local heat.  Feet:     Right foot:     Skin integrity: Erythema present. No ulcer, blister, skin breakdown or warmth.  Lymphadenopathy:     Cervical: No cervical adenopathy.  Skin:    General: Skin is warm.     Findings: Ecchymosis (Scattered on upper extremities/forearms mainly, and right foot.) present. No erythema or rash.  Neurological:     Mental Status: He is alert and oriented to person, place, and time.     Cranial Nerves: No cranial nerve deficit.     Gait: Gait normal.     Comments: Feet: Abnormal monofilament,decreased, and absent vibration sensation.  Otherwise stable gait,mild limping, not assisted.  Psychiatric:     Comments: Well groomed, good eye  contact.    ASSESSMENT AND PLAN:  Mr.Alonso was seen today for establish care.  Diagnoses and all orders for this visit: Orders Placed This Encounter  Procedures  . DG Foot Complete Right  . Basic metabolic panel    Foot pain, right We discussed possible etiologies. 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 It does not seem to be infectious but osteomylitis is also in the differential dx. Because pacemaker MRI is not an option. We will hold on abx for now. Further recommendations according to foot X ray. Elevation and foot care discussed. Monitor for new symptoms.  TIA (transient ischemic attack) We discussed Dx and prognosis. Continue Aspirin 81 mg daily and Atorvastatin 40 mg daily. He is also on Eliquis 5 mg bid. Given the frequency of episodes, I recommend avoiding driving for now. Continue following with neuro.  Essential hypertension Home BP readings are elevated. Re-checked today: RUE 150/65 and LUE 150/65. Recommend starting Lisinopril low dose, some side effects discussed. Continue monitoring BP regularly. Low salt diet. BMP in 7-10 days.  -     lisinopril (ZESTRIL) 2.5 MG tablet; Take 1 tablet (2.5 mg total) by mouth daily.  Atherosclerosis of aorta (HCC) Continue Atorvastatin 40 mg daily and Aspirin 81 mg daily. We discussed some side effects of aspirin and Eliquis.  Chronic heart failure with preserved ejection fraction (HCC) Asymptomatic.\Lisinopril added today. Continue following with cardiologist.  PAD (peripheral artery disease) (HCC) Continue Aspirin and Atorvastatin 40 mg daily. LDL goal < 70.  Spent 58 minutes with pt.  During this time history was obtained and documented, examination was performed, prior labs/imaging reviewed, and assessment/plan discussed.  Return in about 2 months (around 06/05/2021).  Danesha Kirchoff G. [place], MD  [facility]. [place] office.  A few things to remember from today's  visit:   TIA (transient ischemic attack)  Essential hypertension - Plan: lisinopril (ZESTRIL) 2.5 MG tablet  Foot pain, right - Plan: DG Foot Complete Right  If you need refills please call your pharmacy. Do not use My Chart to  request refills or for acute issues that need immediate attention.   Check feet regularly. X ray of right foot today. Monitor blood pressure. Lisinopril 2.5 mg added today. Lab in 7-10 days.  Please be sure medication list is accurate. If a new problem present, please set up appointment sooner than planned today.

## 2021-04-06 DIAGNOSIS — I5030 Unspecified diastolic (congestive) heart failure: Secondary | ICD-10-CM | POA: Insufficient documentation

## 2021-04-07 ENCOUNTER — Encounter: Payer: Self-pay | Admitting: Family Medicine

## 2021-04-08 ENCOUNTER — Other Ambulatory Visit: Payer: Self-pay | Admitting: Family Medicine

## 2021-04-08 DIAGNOSIS — M79671 Pain in right foot: Secondary | ICD-10-CM

## 2021-04-08 DIAGNOSIS — R2241 Localized swelling, mass and lump, right lower limb: Secondary | ICD-10-CM

## 2021-04-08 NOTE — Progress Notes (Signed)
1:24 pm Try to contact DrVirgina Organ, Aamer A but office is closed. Will try again later today. Cameron Dougher [place], MD  04/11/21  He has appt with Dr Lalla Brothers on 05/06/21. I sent him a message asking for option about foot MRI and pacemaker. See foot X ray report/result note. Cameron Arntz [place], MD  Could not reach cardiologist, so right foot CT order placed. Cameron Knudtson [place], MD

## 2021-04-14 ENCOUNTER — Other Ambulatory Visit (INDEPENDENT_AMBULATORY_CARE_PROVIDER_SITE_OTHER): Payer: Medicare Other

## 2021-04-14 ENCOUNTER — Other Ambulatory Visit: Payer: Self-pay

## 2021-04-14 DIAGNOSIS — I10 Essential (primary) hypertension: Secondary | ICD-10-CM | POA: Diagnosis not present

## 2021-04-15 LAB — BASIC METABOLIC PANEL
BUN: 26 mg/dL — ABNORMAL HIGH (ref 6–23)
CO2: 20 mEq/L (ref 19–32)
Calcium: 9.2 mg/dL (ref 8.4–10.5)
Chloride: 104 mEq/L (ref 96–112)
Creatinine, Ser: 1.03 mg/dL (ref 0.40–1.50)
GFR: 68.52 mL/min (ref >60.00)
Glucose, Bld: 110 mg/dL — ABNORMAL HIGH (ref 70–99)
Potassium: 4.5 mEq/L (ref 3.5–5.1)
Sodium: 137 mEq/L (ref 135–145)

## 2021-04-20 NOTE — Addendum Note (Signed)
Addended by: Kathreen Devoid on: 04/20/2021 02:36 PM   Modules accepted: Orders

## 2021-04-21 NOTE — Progress Notes (Signed)
Remote pacemaker transmission.   

## 2021-04-26 ENCOUNTER — Ambulatory Visit: Payer: Medicare Other | Admitting: Adult Health

## 2021-05-03 ENCOUNTER — Other Ambulatory Visit: Payer: Self-pay

## 2021-05-03 ENCOUNTER — Telehealth: Payer: Self-pay | Admitting: Family Medicine

## 2021-05-03 ENCOUNTER — Ambulatory Visit (INDEPENDENT_AMBULATORY_CARE_PROVIDER_SITE_OTHER): Payer: Medicare Other

## 2021-05-03 VITALS — BP 124/62 | HR 81 | Temp 98.4°F | Wt 164.6 lb

## 2021-05-03 DIAGNOSIS — Z0000 Encounter for general adult medical examination without abnormal findings: Secondary | ICD-10-CM

## 2021-05-03 NOTE — Patient Instructions (Signed)
[name] , Thank you for taking time to come for your Medicare Wellness Visit. I appreciate your ongoing commitment to your health goals. Please review the following plan we discussed and let me know if I can assist you in the future.   Screening recommendations/referrals: Colonoscopy: No longer required  Recommended yearly ophthalmology/optometry visit for glaucoma screening and checkup Recommended yearly dental visit for hygiene and checkup  Vaccinations: Influenza vaccine: Due 06/13/21 Pneumococcal vaccine: Due and discussed Tdap vaccine: done 12/21/17 repeat in 10 years  Shingles vaccine: Pt will call back with dates    Covid-19: Completed 1/13, 2/3, 08/19/20 & 03/31/21  Advanced directives: Please bring a copy of your health care power of attorney and living will to the office at your convenience.  Conditions/risks identified: Continue exercise   Next appointment: Follow up in one year for your annual wellness visit.   Preventive Care 28 Years and Older, Male Preventive care refers to lifestyle choices and visits with your health care provider that can promote health and wellness. What does preventive care include? A yearly physical exam. This is also called an annual well check. Dental exams once or twice a year. Routine eye exams. Ask your health care provider how often you should have your eyes checked. Personal lifestyle choices, including: Daily care of your teeth and gums. Regular physical activity. Eating a healthy diet. Avoiding tobacco and drug use. Limiting alcohol use. Practicing safe sex. Taking low doses of aspirin every day. Taking vitamin and mineral supplements as recommended by your health care provider. What happens during an annual well check? The services and screenings done by your health care provider during your annual well check will depend on your age, overall health, lifestyle risk factors, and family history of disease. Counseling  Your health care  provider may ask you questions about your: Alcohol use. Tobacco use. Drug use. Emotional well-being. Home and relationship well-being. Sexual activity. Eating habits. History of falls. Memory and ability to understand (cognition). Work and work Astronomer. Screening  You may have the following tests or measurements: Height, weight, and BMI. Blood pressure. Lipid and cholesterol levels. These may be checked every 5 years, or more frequently if you are over 33 years old. Skin check. Lung cancer screening. You may have this screening every year starting at age 34 if you have a 30-pack-year history of smoking and currently smoke or have quit within the past 15 years. Fecal occult blood test (FOBT) of the stool. You may have this test every year starting at age 33. Flexible sigmoidoscopy or colonoscopy. You may have a sigmoidoscopy every 5 years or a colonoscopy every 10 years starting at age 10. Prostate cancer screening. Recommendations will vary depending on your family history and other risks. Hepatitis C blood test. Hepatitis B blood test. Sexually transmitted disease (STD) testing. Diabetes screening. This is done by checking your blood sugar (glucose) after you have not eaten for a while (fasting). You may have this done every 1-3 years. Abdominal aortic aneurysm (AAA) screening. You may need this if you are a current or former smoker. Osteoporosis. You may be screened starting at age 58 if you are at high risk. Talk with your health care provider about your test results, treatment options, and if necessary, the need for more tests. Vaccines  Your health care provider may recommend certain vaccines, such as: Influenza vaccine. This is recommended every year. Tetanus, diphtheria, and acellular pertussis (Tdap, Td) vaccine. You may need a Td booster every 10 years.  Zoster vaccine. You may need this after age 48. Pneumococcal 13-valent conjugate (PCV13) vaccine. One dose is  recommended after age 62. Pneumococcal polysaccharide (PPSV23) vaccine. One dose is recommended after age 23. Talk to your health care provider about which screenings and vaccines you need and how often you need them. This information is not intended to replace advice given to you by your health care provider. Make sure you discuss any questions you have with your health care provider. Document Released: 11/26/2015 Document Revised: 07/19/2016 Document Reviewed: 08/31/2015 Elsevier Interactive Patient Education  2017 [place] Prevention in the Home Falls can cause injuries. They can happen to people of all ages. There are many things you can do to make your home safe and to help prevent falls. What can I do on the outside of my home? Regularly fix the edges of walkways and driveways and fix any cracks. Remove anything that might make you trip as you walk through a door, such as a raised step or threshold. Trim any bushes or trees on the path to your home. Use bright outdoor lighting. Clear any walking paths of anything that might make someone trip, such as rocks or tools. Regularly check to see if handrails are loose or broken. Make sure that both sides of any steps have handrails. Any raised decks and porches should have guardrails on the edges. Have any leaves, snow, or ice cleared regularly. Use sand or salt on walking paths during winter. Clean up any spills in your garage right away. This includes oil or grease spills. What can I do in the bathroom? Use night lights. Install grab bars by the toilet and in the tub and shower. Do not use towel bars as grab bars. Use non-skid mats or decals in the tub or shower. If you need to sit down in the shower, use a plastic, non-slip stool. Keep the floor dry. Clean up any water that spills on the floor as soon as it happens. Remove soap buildup in the tub or shower regularly. Attach bath mats securely with double-sided non-slip rug  tape. Do not have throw rugs and other things on the floor that can make you trip. What can I do in the bedroom? Use night lights. Make sure that you have a light by your bed that is easy to reach. Do not use any sheets or blankets that are too big for your bed. They should not hang down onto the floor. Have a firm chair that has side arms. You can use this for support while you get dressed. Do not have throw rugs and other things on the floor that can make you trip. What can I do in the kitchen? Clean up any spills right away. Avoid walking on wet floors. Keep items that you use a lot in easy-to-reach places. If you need to reach something above you, use a strong step stool that has a grab bar. Keep electrical cords out of the way. Do not use floor polish or wax that makes floors slippery. If you must use wax, use non-skid floor wax. Do not have throw rugs and other things on the floor that can make you trip. What can I do with my stairs? Do not leave any items on the stairs. Make sure that there are handrails on both sides of the stairs and use them. Fix handrails that are broken or loose. Make sure that handrails are as long as the stairways. Check any carpeting to make sure that  it is firmly attached to the stairs. Fix any carpet that is loose or worn. Avoid having throw rugs at the top or bottom of the stairs. If you do have throw rugs, attach them to the floor with carpet tape. Make sure that you have a light switch at the top of the stairs and the bottom of the stairs. If you do not have them, ask someone to add them for you. What else can I do to help prevent falls? Wear shoes that: Do not have high heels. Have rubber bottoms. Are comfortable and fit you well. Are closed at the toe. Do not wear sandals. If you use a stepladder: Make sure that it is fully opened. Do not climb a closed stepladder. Make sure that both sides of the stepladder are locked into place. Ask someone to  hold it for you, if possible. Clearly mark and make sure that you can see: Any grab bars or handrails. First and last steps. Where the edge of each step is. Use tools that help you move around (mobility aids) if they are needed. These include: Canes. Walkers. Scooters. Crutches. Turn on the lights when you go into a dark area. Replace any light bulbs as soon as they burn out. Set up your furniture so you have a clear path. Avoid moving your furniture around. If any of your floors are uneven, fix them. If there are any pets around you, be aware of where they are. Review your medicines with your doctor. Some medicines can make you feel dizzy. This can increase your chance of falling. Ask your doctor what other things that you can do to help prevent falls. This information is not intended to replace advice given to you by your health care provider. Make sure you discuss any questions you have with your health care provider. Document Released: 08/26/2009 Document Revised: 04/06/2016 Document Reviewed: 12/04/2014 Elsevier Interactive Patient Education  2017 [employer].

## 2021-05-03 NOTE — Progress Notes (Addendum)
Subjective:   [name] is a 80 y.o. male who presents for an Initial Medicare Annual Wellness Visit.  Review of Systems     Cardiac Risk Factors include: advanced age (>22men, >57 women)     Objective:    Today's Vitals   05/03/21 1207  BP: 124/62  Pulse: 81  Temp: 98.4 F (36.9 C)  SpO2: 96%  Weight: 164 lb 9.6 oz (74.7 kg)   Body mass index is 23.62 kg/m.  Advanced Directives 05/03/2021 03/11/2021 02/22/2021 12/24/2020 09/14/2020 09/11/2020  Does Patient Have a Medical Advance Directive? Yes Yes No Yes No No  Type of Estate agent of [name];Living will Healthcare Power of [place];Living will - Out of facility DNR (pink MOST or yellow form);Living will;Healthcare Power of Attorney - -  Does patient want to make changes to medical advance directive? - No - Patient declined - - - -  Copy of Healthcare Power of Attorney in Chart? No - copy requested - - - - -  Would patient like information on creating a medical advance directive? - - - - No - Patient declined No - Patient declined    Current Medications (verified) Outpatient Encounter Medications as of 05/03/2021  Medication Sig   albuterol (VENTOLIN HFA) 108 (90 Base) MCG/ACT inhaler Inhale 2 puffs into the lungs 4 (four) times daily as needed for wheezing or shortness of breath.   apixaban (ELIQUIS) 5 MG TABS tablet Take 1 tablet (5 mg total) by mouth 2 (two) times daily.   aspirin EC 81 MG tablet Take 1 tablet (81 mg total) by mouth daily. Swallow whole.   atorvastatin (LIPITOR) 40 MG tablet Take 1 tablet (40 mg total) by mouth daily.   cholecalciferol (VITAMIN D) 25 MCG (1000 UNIT) tablet Take 1,000 Units by mouth at bedtime.   Ferrous Sulfate (IRON) 325 (65 Fe) MG TABS Take 1 tablet by mouth at bedtime.   Fluticasone Furoate (ARNUITY ELLIPTA) 200 MCG/ACT AEPB Inhale 1 puff into the lungs every morning.   Magnesium 250 MG TABS Take 250 mg by mouth every morning.   Multiple Vitamin (MULTIVITAMIN  WITH MINERALS) TABS tablet Take 1 tablet by mouth every morning.   Omega-3 Fatty Acids (FISH OIL PO) Take 1 capsule by mouth every morning.   omeprazole (PRILOSEC) 40 MG capsule Take 40 mg by mouth daily before supper.   [DISCONTINUED] lisinopril (ZESTRIL) 2.5 MG tablet Take 1 tablet (2.5 mg total) by mouth daily. (Patient not taking: Reported on 05/03/2021)   No facility-administered encounter medications on file as of 05/03/2021.    Allergies (verified) Patient has no known allergies.   History: Past Medical History:  Diagnosis Date   Carotid artery occlusion    GERD (gastroesophageal reflux disease)    Hypertension    Stroke [facility])    TIA   Past Surgical History:  Procedure Laterality Date   CATARACT EXTRACTION, BILATERAL     ENDARTERECTOMY Left 09/16/2020   Procedure: LEFT ENDARTERECTOMY CAROTID;  Surgeon: Cameron Harman, MD;  Location: [facility] OR;  Service: Vascular;  Laterality: Left;   HERNIA REPAIR     PACEMAKER IMPLANT     TONSILLECTOMY     Family History  Family history unknown: Yes   Social History   Socioeconomic History   Marital status: Married    Spouse name: Not on file   Number of children: Not on file   Years of education: Not on file   Highest education level: Not on file  Occupational History  Not on file  Tobacco Use   Smoking status: Former    Pack years: 0.00   Smokeless tobacco: Never  Vaping Use   Vaping Use: Never used  Substance and Sexual Activity   Alcohol use: Yes    Comment: occassional   Drug use: Not on file   Sexual activity: Not Currently  Other Topics Concern   Not on file  Social History Narrative   Not on file   Social Determinants of Health   Financial Resource Strain: Low Risk    Difficulty of Paying Living Expenses: Not hard at all  Food Insecurity: No Food Insecurity   Worried About Programme researcher, broadcasting/film/video in the Last Year: Never true   Ran Out of Food in the Last Year: Never true  Transportation Needs: No  Transportation Needs   Lack of Transportation (Medical): No   Lack of Transportation (Non-Medical): No  Physical Activity: Insufficiently Active   Days of Exercise per Week: 5 days   Minutes of Exercise per Session: 10 min  Stress: No Stress Concern Present   Feeling of Stress : Not at all  Social Connections: Moderately Integrated   Frequency of Communication with Friends and Family: More than three times a week   Frequency of Social Gatherings with Friends and Family: More than three times a week   Attends Religious Services: 1 to 4 times per year   Active Member of [employer] or Organizations: No   Attends Engineer, structural: Never   Marital Status: Married    Tobacco Counseling Counseling given: Not Answered   Clinical Intake:  Pre-visit preparation completed: Yes  Pain : No/denies pain     BMI - recorded: 23.62 Nutritional Status: BMI of 19-24  Normal Nutritional Risks: None  How often do you need to have someone help you when you read instructions, pamphlets, or other written materials from your doctor or pharmacy?: 1 - Never  Diabetic?No  Interpreter Needed?: No  Information entered by :: Cameron Ensign, LPN   Activities of Daily Living In your present state of health, do you have any difficulty performing the following activities: 05/03/2021 12/25/2020  Hearing? [name]  Comment wears a hearing aid -  Vision? N N  Difficulty concentrating or making decisions? N N  Walking or climbing stairs? N N  Dressing or bathing? N N  Doing errands, shopping? N -  Preparing Food and eating ? N -  Using the Toilet? N -  In the past six months, have you accidently leaked urine? N -  Do you have problems with loss of bowel control? N -  Managing your Medications? N -  Managing your Finances? N -  Housekeeping or managing your Housekeeping? N -    Patient Care Team: [place], Betty G, MD as PCP - General (Family Medicine)  Indicate any recent Medical Services you  may have received from other than [facility] providers in the past year (date may be approximate).     Assessment:   This is a routine wellness examination for [name].  Hearing/Vision screen Hearing Screening - Comments:: Pt wears hearing aids  Vision Screening - Comments:: Pt will find a provider had annual eye exams 2021 with Dr Stephannie Peters of [place]  Dietary issues and exercise activities discussed: Current Exercise Habits: Home exercise routine, Type of exercise: walking (stationary bike), Time (Minutes): 10, Frequency (Times/Week): 5, Weekly Exercise (Minutes/Week): 50   Goals Addressed  This Visit's Progress    Patient Stated       Continue exercise         Depression Screen PHQ 2/9 Scores 05/03/2021 10/21/2020  PHQ - 2 Score 0 0    Fall Risk Fall Risk  05/03/2021  Falls in the past year? 1  Number falls in past yr: 1  Injury with Fall? 1  Comment left arm  Risk for fall due to : Impaired vision;Impaired balance/gait  Follow up Falls prevention discussed    FALL RISK PREVENTION PERTAINING TO THE HOME:  Any stairs in or around the home? No  If so, are there any without handrails? No  Home free of loose throw rugs in walkways, pet beds, electrical cords, etc? Yes  Adequate lighting in your home to reduce risk of falls? Yes   ASSISTIVE DEVICES UTILIZED TO PREVENT FALLS:  Life alert? Yes  call light  Use of a cane, walker or w/c? Yes  Grab bars in the bathroom? Yes  Shower chair or bench in shower? Yes  Elevated toilet seat or a handicapped toilet? Yes   TIMED UP AND GO:  Was the test performed? Yes .  Length of time to ambulate 10 feet: 15 sec.   Gait steady and fast with assistive device  Cognitive Function:     6CIT Screen 05/03/2021  What Year? 0 points  What month? 0 points  What time? 3 points  Count back from 20 0 points  Months in reverse 0 points  Repeat phrase 0 points  Total Score 3    Immunizations Immunization History   Administered Date(s) Administered   Influenza Split 08/13/2018   PFIZER(Purple Top)SARS-COV-2 Vaccination 11/26/2019, 12/17/2019, 08/19/2020, 03/31/2021   Pneumococcal Polysaccharide-23 11/10/2015   Tdap 12/21/2017    TDAP status: Up to date  Flu Vaccine status: Due, Education has been provided regarding the importance of this vaccine. Advised may receive this vaccine at local pharmacy or Health Dept. Aware to provide a copy of the vaccination record if obtained from local pharmacy or Health Dept. Verbalized acceptance and understanding.  Pneumococcal vaccine status: Due, Education has been provided regarding the importance of this vaccine. Advised may receive this vaccine at local pharmacy or Health Dept. Aware to provide a copy of the vaccination record if obtained from local pharmacy or Health Dept. Verbalized acceptance and understanding.  Covid-19 vaccine status: Completed vaccines  Qualifies for Shingles Vaccine? Yes   Zostavax completed No   Shingrix Completed?: No.    Education has been provided regarding the importance of this vaccine. Patient has been advised to call insurance company to determine out of pocket expense if they have not yet received this vaccine. Advised may also receive vaccine at local pharmacy or Health Dept. Verbalized acceptance and understanding.  Screening Tests Health Maintenance  Topic Date Due   Zoster Vaccines- Shingrix (1 of 2) Never done   PNA vac Low Risk Adult (2 of 2 - PCV13) 11/09/2016   INFLUENZA VACCINE  06/13/2021   TETANUS/TDAP  12/22/2027   COVID-19 Vaccine  Completed   HPV VACCINES  Aged Out    Health Maintenance  Health Maintenance Due  Topic Date Due   Zoster Vaccines- Shingrix (1 of 2) Never done   PNA vac Low Risk Adult (2 of 2 - PCV13) 11/09/2016    Colorectal cancer screening: No longer required.     Additional Screening:  Vision Screening: Recommended annual ophthalmology exams for early detection of glaucoma and  other disorders of  the eye. Is the patient up to date with their annual eye exam?  Yes  Who is the provider or what is the name of the office in which the patient attends annual eye exams? Will find a new provider in [place]  If pt is not established with a provider, would they like to be referred to a provider to establish care? No .   Dental Screening: Recommended annual dental exams for proper oral hygiene  Community Resource Referral / Chronic Care Management: CRR required this visit?  No   CCM required this visit?  No      Plan:     I have personally reviewed and noted the following in the patient's chart:   Medical and social history Use of alcohol, tobacco or illicit drugs  Current medications and supplements including opioid prescriptions. Patient is not currently taking opioid prescriptions. Functional ability and status Nutritional status Physical activity Advanced directives List of other physicians Hospitalizations, surgeries, and ER visits in previous 12 months Vitals Screenings to include cognitive, depression, and falls Referrals and appointments  In addition, I have reviewed and discussed with patient certain preventive protocols, quality metrics, and best practice recommendations. A written personalized care plan for preventive services as well as general preventive health recommendations were provided to patient.     Marzella Schleinina H Erik Burkett, LPN   1/61/09606/21/2022   Nurse Notes: None

## 2021-05-03 NOTE — Telephone Encounter (Signed)
Patients chart has been updated.

## 2021-05-03 NOTE — Telephone Encounter (Signed)
The patient called to updated that he had his Shingles vaccine done 12/14/2017 and 05/14/2018 and needs updated in his chart please.

## 2021-05-06 ENCOUNTER — Ambulatory Visit (INDEPENDENT_AMBULATORY_CARE_PROVIDER_SITE_OTHER): Payer: Medicare Other | Admitting: Cardiology

## 2021-05-06 ENCOUNTER — Other Ambulatory Visit: Payer: Self-pay

## 2021-05-06 ENCOUNTER — Encounter: Payer: Self-pay | Admitting: Cardiology

## 2021-05-06 VITALS — BP 148/66 | HR 67 | Ht 70.0 in | Wt 165.4 lb

## 2021-05-06 DIAGNOSIS — I480 Paroxysmal atrial fibrillation: Secondary | ICD-10-CM | POA: Diagnosis not present

## 2021-05-06 DIAGNOSIS — I4892 Unspecified atrial flutter: Secondary | ICD-10-CM | POA: Diagnosis not present

## 2021-05-06 DIAGNOSIS — I10 Essential (primary) hypertension: Secondary | ICD-10-CM

## 2021-05-06 DIAGNOSIS — Z950 Presence of cardiac pacemaker: Secondary | ICD-10-CM | POA: Diagnosis not present

## 2021-05-06 NOTE — Progress Notes (Signed)
Electrophysiology Office Follow up Visit Note:    Date:  05/06/2021   ID:  [name], DOB [birth year], MRN [id number]  PCP:  [place], Betty G, MD  [facility] Cardiologist:  None  [facility] Electrophysiologist:  Lanier Prude, MD    Interval History:    [name] is a 80 y.o. male who presents for a follow up visit.  I last saw the patient on September 28, 2020 for atrial flutter and a pacemaker.  He is maintained on Eliquis for stroke prophylaxis.  He also has a history of stroke post carotid endarterectomy.  Since I last saw him he had 2 additional TIAs.  These were thought to represent recrudescence given some intracerebral atherosclerotic disease.  No new strokes were observed on brain imaging.  He continues to take Eliquis.  He is extremely active.  He walks in his retirement home and outside the facility.  He also rides a exercise bike multiple times per week.  He has been taking his blood pressure multiple times per day and significantly decreasing his sodium intake.      Past Medical History:  Diagnosis Date   Carotid artery occlusion    GERD (gastroesophageal reflux disease)    Hypertension    Stroke [facility])    TIA    Past Surgical History:  Procedure Laterality Date   CATARACT EXTRACTION, BILATERAL     ENDARTERECTOMY Left 09/16/2020   Procedure: LEFT ENDARTERECTOMY CAROTID;  Surgeon: Maeola Harman, MD;  Location: [facility] OR;  Service: Vascular;  Laterality: Left;   HERNIA REPAIR     PACEMAKER IMPLANT     TONSILLECTOMY      Current Medications: Current Meds  Medication Sig   albuterol (VENTOLIN HFA) 108 (90 Base) MCG/ACT inhaler Inhale 2 puffs into the lungs 4 (four) times daily as needed for wheezing or shortness of breath.   apixaban (ELIQUIS) 5 MG TABS tablet Take 1 tablet (5 mg total) by mouth 2 (two) times daily.   aspirin EC 81 MG tablet Take 1 tablet (81 mg total) by mouth daily. Swallow whole.   atorvastatin (LIPITOR) 40 MG tablet Take 1  tablet (40 mg total) by mouth daily.   cholecalciferol (VITAMIN D) 25 MCG (1000 UNIT) tablet Take 1,000 Units by mouth at bedtime.   Ferrous Sulfate (IRON) 325 (65 Fe) MG TABS Take 1 tablet by mouth at bedtime.   Fluticasone Furoate (ARNUITY ELLIPTA) 200 MCG/ACT AEPB Inhale 1 puff into the lungs every morning.   Magnesium 250 MG TABS Take 250 mg by mouth every morning.   Multiple Vitamin (MULTIVITAMIN WITH MINERALS) TABS tablet Take 1 tablet by mouth every morning.   Omega-3 Fatty Acids (FISH OIL PO) Take 1 capsule by mouth every morning.   omeprazole (PRILOSEC) 40 MG capsule Take 40 mg by mouth daily before supper.     Allergies:   Patient has no known allergies.   Social History   Socioeconomic History   Marital status: Married    Spouse name: Not on file   Number of children: Not on file   Years of education: Not on file   Highest education level: Not on file  Occupational History   Not on file  Tobacco Use   Smoking status: Former    Pack years: 0.00   Smokeless tobacco: Never  Vaping Use   Vaping Use: Never used  Substance and Sexual Activity   Alcohol use: Yes    Comment: occassional   Drug use: Not on file  Sexual activity: Not Currently  Other Topics Concern   Not on file  Social History Narrative   Not on file   Social Determinants of Health   Financial Resource Strain: Low Risk    Difficulty of Paying Living Expenses: Not hard at all  Food Insecurity: No Food Insecurity   Worried About Running Out of Food in the Last Year: Never true   Ran Out of Food in the Last Year: Never true  Transportation Needs: No Transportation Needs   Lack of Transportation (Medical): No   Lack of Transportation (Non-Medical): No  Physical Activity: Insufficiently Active   Days of Exercise per Week: 5 days   Minutes of Exercise per Session: 10 min  Stress: No Stress Concern Present   Feeling of Stress : Not at all  Social Connections: Moderately Integrated   Frequency of  Communication with Friends and Family: More than three times a week   Frequency of Social Gatherings with Friends and Family: More than three times a week   Attends Religious Services: 1 to 4 times per year   Active Member of [employer] or Organizations: No   Attends Banker Meetings: Never   Marital Status: Married     Family History: The patient's Family history is unknown by patient.  ROS:   Please see the history of present illness.    All other systems reviewed and are negative.  EKGs/Labs/Other Studies Reviewed:    The following studies were reviewed today:  Hospitalization records Neurology consult note  May 06, 2021 in clinic device check personally reviewed Battery longevity 10.5 years Lead parameter stable Atrially pacing 96% Ventricular pacing 7.6% A. fib burden less than 1% PVARP extended to 325 ms for episode of PMT    EKG:  The ekg ordered today demonstrates AV sequential pacing  Recent Labs: 09/14/2020: Magnesium 1.9 03/11/2021: ALT 35 03/12/2021: TSH 0.391 03/13/2021: Hemoglobin 11.8; Platelets 152 04/14/2021: BUN 26; Creatinine, Ser 1.03; Potassium 4.5; Sodium 137  Recent Lipid Panel    Component Value Date/Time   CHOL 78 03/12/2021 0242   TRIG 68 03/12/2021 0242   HDL 33 (L) 03/12/2021 0242   CHOLHDL 2.4 03/12/2021 0242   VLDL 14 03/12/2021 0242   LDLCALC 31 03/12/2021 0242    Physical Exam:    VS:  BP (!) 148/66   Pulse 67   Ht 5\' 10"  (1.778 m)   Wt 165 lb 6.4 oz (75 kg)   SpO2 97%   BMI 23.73 kg/m     Wt Readings from Last 3 Encounters:  05/06/21 165 lb 6.4 oz (75 kg)  05/03/21 164 lb 9.6 oz (74.7 kg)  04/05/21 170 lb (77.1 kg)     GEN:  Well nourished, well developed in no acute distress HEENT: Normal NECK: No JVD; No carotid bruits LYMPHATICS: No lymphadenopathy CARDIAC: RRR, no murmurs, rubs, gallops.  Pacemaker pocket well-healed RESPIRATORY:  Clear to auscultation without rales, wheezing or rhonchi  ABDOMEN: Soft,  non-tender, non-distended MUSCULOSKELETAL:  No edema; No deformity  SKIN: Warm and dry NEUROLOGIC:  Alert and oriented x 3 PSYCHIATRIC:  Normal affect   ASSESSMENT:    1. PAF (paroxysmal atrial fibrillation) (HCC)   2. Atrial flutter, unspecified type (HCC)   3. Essential hypertension    PLAN:    In order of problems listed above:  1. PAF (paroxysmal atrial fibrillation) (HCC) Very low burden of atrial fibrillation, less than 1% On Eliquis for stroke prophylaxis I do not believe his recent TIAs represent  a failure of Eliquis given the neurology consult notes believe that these are more consistent with recrudescence.  2. Atrial flutter, unspecified type (HCC)   3. Essential hypertension I would recommend a blood pressure goal of less than 150 mmHg.  I am more concerned about him having hypotensive episodes given his multiple TIA events attributed to hypotension and cerebral atherosclerotic disease.  He has been aggressively limiting his salt.  I encouraged him to keep his sodium intake in moderation but I do not think he needs to dramatically reduce his sodium intake.       Total time spent with patient today 45 minutes. This includes reviewing records, evaluating the patient and coordinating care.   Medication Adjustments/Labs and Tests Ordered: Current medicines are reviewed at length with the patient today.  Concerns regarding medicines are outlined above.  No orders of the defined types were placed in this encounter.  No orders of the defined types were placed in this encounter.    Signed, Steffanie Dunn, MD, [facility], [facility] 05/06/2021 3:32 PM    Electrophysiology [facility]

## 2021-05-06 NOTE — Patient Instructions (Signed)
Medication Instructions:  Your physician recommends that you continue on your current medications as directed. Please refer to the Current Medication list given to you today. *If you need a refill on your cardiac medications before your next appointment, please call your pharmacy*  Lab Work: None ordered. If you have labs (blood work) drawn today and your tests are completely normal, you will receive your results only by: MyChart Message (if you have MyChart) OR A paper copy in the mail If you have any lab test that is abnormal or we need to change your treatment, we will call you to review the results.  Testing/Procedures: None ordered.  Follow-Up: At [facility], you and your health needs are our priority.  As part of our continuing mission to provide you with exceptional heart care, we have created designated Provider Care Teams.  These Care Teams include your primary Cardiologist (physician) and Advanced Practice Providers (APPs -  Physician Assistants and Nurse Practitioners) who all work together to provide you with the care you need, when you need it.  Your next appointment:   Your physician wants you to follow-up in: one year with Dr. Lalla Brothers.   You will receive a reminder letter in the mail two months in advance. If you don't receive a letter, please call our office to schedule the follow-up appointment.  Remote monitoring is used to monitor your Pacemaker from home. This monitoring reduces the number of office visits required to check your device to one time per year. It allows [place] to keep an eye on the functioning of your device to ensure it is working properly. You are scheduled for a device check from home on 06/28/2021. You may send your transmission at any time that day. If you have a wireless device, the transmission will be sent automatically. After your physician reviews your transmission, you will receive a postcard with your next transmission date.

## 2021-06-01 ENCOUNTER — Encounter: Payer: Self-pay | Admitting: Adult Health

## 2021-06-01 ENCOUNTER — Ambulatory Visit (INDEPENDENT_AMBULATORY_CARE_PROVIDER_SITE_OTHER): Payer: Medicare Other | Admitting: Adult Health

## 2021-06-01 VITALS — BP 124/66 | HR 63 | Ht 70.0 in | Wt 162.0 lb

## 2021-06-01 DIAGNOSIS — E785 Hyperlipidemia, unspecified: Secondary | ICD-10-CM

## 2021-06-01 DIAGNOSIS — Z8673 Personal history of transient ischemic attack (TIA), and cerebral infarction without residual deficits: Secondary | ICD-10-CM | POA: Diagnosis not present

## 2021-06-01 DIAGNOSIS — Z9189 Other specified personal risk factors, not elsewhere classified: Secondary | ICD-10-CM | POA: Diagnosis not present

## 2021-06-01 DIAGNOSIS — I480 Paroxysmal atrial fibrillation: Secondary | ICD-10-CM

## 2021-06-01 DIAGNOSIS — I10 Essential (primary) hypertension: Secondary | ICD-10-CM

## 2021-06-01 NOTE — Progress Notes (Signed)
I agree with the above plan 

## 2021-06-01 NOTE — Progress Notes (Signed)
[facility] [address] [place]. [place] [address] [phone]       STROKE FOLLOW UP NOTE  [name] Date of Birth:  11/06/1941 Medical Record Number:  [id number]   Reason for Referral: f/u recent TIA vs syncopal event     SUBJECTIVE:   CHIEF COMPLAINT:  Chief Complaint  Patient presents with   Transient Ischemic Attack    Rm 3, 6 month FU wife- Cameron Bible "had another TIA in April; would like to discuss sleep apnea again"     HPI:   Today, 06/01/2021, [name] returns for follow-up visit regarding recurrent TIAs.  Since he was last seen 5 months ago, he presented to ED on 03/11/2021 for likely recrudescence of expressive aphasia in setting of hypotension (88/51) and AKI (per ED note).  Imaging and work-up unremarkable.  He has not had any additional events since that time.  Reports compliance on Eliquis, aspirin 81 mg daily and atorvastatin 40 mg daily without associated side effects.  Blood pressure today 124/66. Routinely monitors at home and has been stable. Has follow up with Dr. Randie Heinz on 9/1 for carotid duplex. He is interested in pursing sleep evaluation at this time. No further concerns at this time.      History provided for reference purposes only Update 01/06/2021 JM: [name] is being seen for recent TIA hospital follow-up.  He presented to [facility] ED on 12/24/2020 with notes, lab work and imaging personally reviewed with reported dizziness and lightheadedness and difficulty speaking after a fall.  He has had prior TIAs with speech difficulty with this event similar to prior episodes but not as bad and symptoms completely resolved.  Evaluated by Dr. Pearlean Brownie with CT head negative and CTA L carotid endarterectomy patent without significant stenosis.  MRI unable to be obtained due to pacer.  LDL 34.  A1c 5.6.  It was felt as though his symptoms possibly more related to syncopal event vs TIA although he does have stroke risk factors including atrial flutter on  Eliquis, HLD on atorvastatin, HTN and prediabetes.  Since that time, he has not had any additional or new stroke/TIA symptoms.   Reports compliance on Eliquis 5 mg twice daily, aspirin 81 mg daily and atorvastatin 40 mg daily -denies side effects Blood pressure today 142/72 - monitors at home and typically stable  No new concerns at this time  Initial visit 10/21/2020 JM: [name] is being seen for hospital follow-up accompanied by his wife.  He has been doing well since discharge without new or reoccurring stroke/TIA symptoms.  Remains on aspirin and Eliquis for secondary stroke prevention with known AF and s/p L CEA without bleeding or bruising.  Remains on atorvastatin 40 mg daily without myalgias.  Blood pressure today satisfactory 136/61.  Wife concerned regarding possible apnea with sleep study 3 years ago in [place], [place] diagnosed with mild sleep apnea per patient (unable to view via epic) and no treatment indicated.  Since that time, increased nocturia q2-3hrs, witnessed apnea and snoring.  Use of some type of mouthguard and head elevation while sleeping which has decreased snoring and apnea. No further concerns.   Stroke admission 09/11/2020 [name] is a 80 y.o. male who recently moved to [facility] from [place] with a PMHx of Htn, GERD, ? atrial fibrillation, bradycardia s/p pacemaker in 2016.  Presented on 09/11/2020 with word finding difficulty and headache.  Personally reviewed pertinent hospitalization progress notes, lab work and imaging with summary provided.  Evaluated by  Dr. Roda Shutters with likely TIA secondary to AF not on AC vs soft plaque at left ICA bulb.  MRI unable to be obtained d/t noncompatible pacer.  Pacer interrogation +SVT and short run of a flutter.  Questionable history of A. fib which was confirmed by prior cardiologist but low burden therefore AC not started.  In setting of recent TIA, initiated Eliquis for secondary stroke prevention. Hx of HTN stable and resumed  home dose metoprolol. Hx of HLD on pravastatin with LDL 58.  Other stroke risk factors include advanced age, EtOH use and family history of strokes but no personal prior history of strokes.  Other active problems include anemia, aortic arthrosclerosis, bradycardia s/p pacer and GERD.  Evaluated by therapies without additional therapy needs and discharged home in stable condition on 09/14/2020.  He returned shortly after discharge on 09/14/2020 with recurrence of speech and language difficulty.  CTA head/neck repeated negative LVO or worsening stenosis with stable fibrofatty plaque of left ICA origin.  Vascular surgery consulted and underwent left CEA without complication.  Initiated aspirin in addition to Eliquis at discharge.  Symptoms resolved and discharged home without therapy needs on 09/17/2020.    09/11/2020 TIA secondary to AF not on AC vs. Soft plaque at left ICA bulb CT head - no acute abnormality MRI head - not able to do due to pacer not compatible with MRI CTA H&N - No emergent large vessel occlusion or high-grade stenosis of the intracranial arteries. Eccentric plaque within the proximal left internal carotid artery may be at increased risk of embolus formation. Bilateral carotid bifurcation atherosclerosis without hemodynamically significant stenosis by NASCET criteria.  CT repeat Unremarkable  2D Echo - EF 55-60%. No source of embolus  Pacemaker interrogation +SVT, short run of Aflutter per Dr. Shelle Iron Virus 2 - neg LDL - 58 HgbA1c - 5.4 UDS - negative VTE prophylaxis - Lovenox aspirin 81 mg daily prior to admission, now on aspirin 81 mg daily and clopidogrel 75 mg daily DAPT  Therapy recommendations:  None Disposition:  home  09/14/2020 recurrent TIA likely due to high risk soft plaque at left ICA bulb s/p left CEA CT head code stroke - no acute abnormality. ASPECTS 10 CTA H&N repeat - stable. No LVO or significant stenosis. Irregular L ICA plaque at origin,  unchanged.  S/p L CEA for high risk soft plaque left ICA LDL - 58 -initiated atorvastatin 40 mg daily HgbA1c - 5.4 UDS - negative VTE prophylaxis - Eliquis aspirin 81 mg daily prior to admission, on ASA 81mg  post left CEA. Will be discharged with ASA 81 and eliquis 5mg  bid.      ROS:   14 system review of systems performed and negative with exception of those listed in HPI  PMH:  Past Medical History:  Diagnosis Date   Carotid artery occlusion    GERD (gastroesophageal reflux disease)    Hypertension    Stroke (HCC) 02/2021   TIA    PSH:  Past Surgical History:  Procedure Laterality Date   CATARACT EXTRACTION, BILATERAL     ENDARTERECTOMY Left 09/16/2020   Procedure: LEFT ENDARTERECTOMY CAROTID;  Surgeon: 03/2021, MD;  Location: [facility] OR;  Service: Vascular;  Laterality: Left;   HERNIA REPAIR     PACEMAKER IMPLANT     TONSILLECTOMY      Social History:  Social History   Socioeconomic History   Marital status: Married    Spouse name: Maeola Harman   Number of children: Not on  file   Years of education: Not on file   Highest education level: Not on file  Occupational History   Not on file  Tobacco Use   Smoking status: Former   Smokeless tobacco: Never  Vaping Use   Vaping Use: Never used  Substance and Sexual Activity   Alcohol use: Yes    Comment: occassional   Drug use: Not on file   Sexual activity: Not Currently  Other Topics Concern   Not on file  Social History Narrative   Lives with wife   Social Determinants of Health   Financial Resource Strain: Low Risk    Difficulty of Paying Living Expenses: Not hard at all  Food Insecurity: No Food Insecurity   Worried About Programme researcher, broadcasting/film/video in the Last Year: Never true   Ran Out of Food in the Last Year: Never true  Transportation Needs: No Transportation Needs   Lack of Transportation (Medical): No   Lack of Transportation (Non-Medical): No  Physical Activity: Insufficiently Active   Days  of Exercise per Week: 5 days   Minutes of Exercise per Session: 10 min  Stress: No Stress Concern Present   Feeling of Stress : Not at all  Social Connections: Moderately Integrated   Frequency of Communication with Friends and Family: More than three times a week   Frequency of Social Gatherings with Friends and Family: More than three times a week   Attends Religious Services: 1 to 4 times per year   Active Member of [employer] or Organizations: No   Attends Engineer, structural: Never   Marital Status: Married  Catering manager Violence: Not At Risk   Fear of Current or Ex-Partner: No   Emotionally Abused: No   Physically Abused: No   Sexually Abused: No    Family History:  Family History  Family history unknown: Yes    Medications:   Current Outpatient Medications on File Prior to Visit  Medication Sig Dispense Refill   albuterol (VENTOLIN HFA) 108 (90 Base) MCG/ACT inhaler Inhale 2 puffs into the lungs 4 (four) times daily as needed for wheezing or shortness of breath.     apixaban (ELIQUIS) 5 MG TABS tablet Take 1 tablet (5 mg total) by mouth 2 (two) times daily. 60 tablet 1   aspirin EC 81 MG tablet Take 1 tablet (81 mg total) by mouth daily. Swallow whole. 30 tablet 1   atorvastatin (LIPITOR) 40 MG tablet Take 1 tablet (40 mg total) by mouth daily. 30 tablet 1   cholecalciferol (VITAMIN D) 25 MCG (1000 UNIT) tablet Take 1,000 Units by mouth at bedtime.     Ferrous Sulfate (IRON) 325 (65 Fe) MG TABS Take 1 tablet by mouth at bedtime.     Fluticasone Furoate (ARNUITY ELLIPTA) 200 MCG/ACT AEPB Inhale 1 puff into the lungs every morning.     Magnesium 250 MG TABS Take 250 mg by mouth every morning.     Multiple Vitamin (MULTIVITAMIN WITH MINERALS) TABS tablet Take 1 tablet by mouth every morning.     Omega-3 Fatty Acids (FISH OIL PO) Take 1 capsule by mouth every morning.     omeprazole (PRILOSEC) 40 MG capsule Take 40 mg by mouth daily before supper.     No current  facility-administered medications on file prior to visit.    Allergies:  No Known Allergies    OBJECTIVE:  Physical Exam  Vitals:   06/01/21 1054  BP: 124/66  Pulse: 63  Weight: 162 lb (  73.5 kg)  Height: 5\' 10"  (1.778 m)    Body mass index is 23.24 kg/m. No results found.  General: well developed, well nourished,  very pleasant elderly Caucasian male, seated, in no evident distress Head: head normocephalic and atraumatic.   Neck: supple with no carotid or supraclavicular bruits Cardiovascular: regular rate and rhythm, no murmurs Musculoskeletal: no deformity Skin:  no rash/petichiae Vascular:  Normal pulses all extremities   Neurologic Exam Mental Status: Awake and fully alert.   Fluent speech and language.  Oriented to place and time. Recent and remote memory intact. Attention span, concentration and fund of knowledge appropriate. Mood and affect appropriate.  Cranial Nerves: Pupils equal, briskly reactive to light. Extraocular movements full without nystagmus. Visual fields full to confrontation.  HOH bilaterally. Facial sensation intact. Face, tongue, palate moves normally and symmetrically.  Motor: Normal bulk and tone. Normal strength in all tested extremity muscles. Sensory.: intact to touch , pinprick , position and vibratory sensation.  Coordination: Rapid alternating movements normal in all extremities. Finger-to-nose and heel-to-shin performed accurately bilaterally. Gait and Station: Arises from chair without difficulty. Stance is normal. Gait demonstrates normal stride length and balance without use of assistive device. Reflexes: 1+ and symmetric. Toes downgoing.         ASSESSMENT: [name] is a 80 y.o. year old male presented with aphasia on 09/11/2020 likely TIA secondary to AF not on AC vs L ICA bulb soft plaque.  Pacer interrogated which showed short run of a flutter w/ confirmed history of A. Fib (initially questioned but confirmed with cardiologist  in [place]) not previously on [facility] due to low burden but in setting of recent event Eliquis initiated.  Returned on 09/14/2020 with recurrence of aphasia likely recurrent TIA in setting of high risk soft plaque of left ICA bulb s/p L CEA placed on aspirin in addition to Eliquis.  Additional event on 12/24/2020 with dizziness and speech difficulty post fall that felt more consistent with presyncopal event versus TIA and on 03/11/2021 with recrudescence of expressive aphasia likely in setting of hypotension.  Vascular risk factors include A. fib/flutter, HTN, HLD, bradycardia s/p pacer, advanced age and EtOH use.      PLAN:  Recurrent TIAs:  As above Continue aspirin 81 mg daily and Eliquis (apixaban) daily  and atorvastatin 40 mg daily for secondary stroke prevention.  Discussed secondary stroke prevention measures and importance of close PCP follow up for aggressive stroke risk factor management  L ICA soft plaque: s/p CEA placed on aspirin.  Followed by [facility] with plans on carotid duplex in 9 months A fib/flutter: On Eliquis 5 mg twice daily for CHA2DS2-VASc score 6.  Routinely followed by cardiology Dr. 03/13/2021  HTN: BP goal <130/90.  Controlled manage/monitor by cardiology HLD: LDL goal <70.  Prior LDL 31 (02/2021) well-controlled on atorvastatin 40 mg daily per PCP At risk for sleep apnea: referral placed to [facility] sleep clinic for further evaluation    Follow up in 3 months or call earlier if needed   CC:  [facility] provider: Dr. Sethi [birth year]27[birth year]1982, [place], [place]    I spent 38 minutes of face-to-face and non-face-to-face time with patient and wife.  This included previsit chart review, lab review, study review, order entry, electronic health record documentation, patient education regarding prior event possible TIAs, A. fib and indication for [facility], s/p CEA and indication for surveillance monitoring with [facility], evaluation for possible sleep apnea, importance of managing stroke risk factors and answered all other  questions  to patient and wife's satisfaction  Cameron AustinJessica McCue, [facility] General HospitalGNP-BC  [facility] [address] [place], [place] [id number]  Phone [phone] Fax [phone] Note: This document was prepared with digital dictation and possible smart phrase technology. Any transcriptional errors that result from this process are unintentional.

## 2021-06-01 NOTE — Patient Instructions (Signed)
Continue aspirin 81 mg daily and Eliquis (apixaban) daily  and atorvastatin  for secondary stroke prevention  Continue to follow up with PCP regarding cholesterol and blood pressure management  Maintain strict control of hypertension with blood pressure goal below 130/90 and cholesterol with LDL cholesterol (bad cholesterol) goal below 70 mg/dL.   You will be called by our sleep center to schedule evaluation for possible underlying sleep apnea    Followup in the future with me in 3 months or call earlier if needed       Thank you for coming to see [place] at [facility]. I hope we have been able to provide you high quality care today.  You may receive a patient satisfaction survey over the next few weeks. We would appreciate your feedback and comments so that we may continue to improve ourselves and the health of our patients.

## 2021-06-28 ENCOUNTER — Ambulatory Visit (INDEPENDENT_AMBULATORY_CARE_PROVIDER_SITE_OTHER): Payer: Medicare Other

## 2021-06-28 DIAGNOSIS — I480 Paroxysmal atrial fibrillation: Secondary | ICD-10-CM

## 2021-06-28 LAB — CUP PACEART REMOTE DEVICE CHECK
Battery Remaining Longevity: 58 mo
Battery Remaining Percentage: 47 %
Battery Voltage: 2.96 V
Brady Statistic AP VP Percent: 7.4 %
Brady Statistic AP VS Percent: 91 %
Brady Statistic AS VP Percent: 1 %
Brady Statistic AS VS Percent: 1.4 %
Brady Statistic RA Percent Paced: 96 %
Brady Statistic RV Percent Paced: 7.4 %
Date Time Interrogation Session: 20220816020015
Implantable Lead Implant Date: 20160928
Implantable Lead Implant Date: 20160928
Implantable Lead Location: 753859
Implantable Lead Location: 753860
Implantable Lead Model: 1948
Implantable Pulse Generator Implant Date: 20160928
Lead Channel Impedance Value: 400 Ohm
Lead Channel Impedance Value: 550 Ohm
Lead Channel Pacing Threshold Amplitude: 0.75 V
Lead Channel Pacing Threshold Amplitude: 0.875 V
Lead Channel Pacing Threshold Pulse Width: 0.4 ms
Lead Channel Pacing Threshold Pulse Width: 0.4 ms
Lead Channel Sensing Intrinsic Amplitude: 2 mV
Lead Channel Sensing Intrinsic Amplitude: 3.2 mV
Lead Channel Setting Pacing Amplitude: 1.125
Lead Channel Setting Pacing Amplitude: 1.75 V
Lead Channel Setting Pacing Pulse Width: 0.4 ms
Lead Channel Setting Sensing Sensitivity: 0.7 mV
Pulse Gen Model: 2240
Pulse Gen Serial Number: 7817736

## 2021-07-14 ENCOUNTER — Other Ambulatory Visit: Payer: Self-pay

## 2021-07-14 ENCOUNTER — Ambulatory Visit (HOSPITAL_COMMUNITY)
Admission: RE | Admit: 2021-07-14 | Discharge: 2021-07-14 | Disposition: A | Discharge status: Home / Self Care | Payer: Medicare Other | Source: Ambulatory Visit | Attending: Vascular Surgery | Admitting: Vascular Surgery

## 2021-07-14 ENCOUNTER — Ambulatory Visit (INDEPENDENT_AMBULATORY_CARE_PROVIDER_SITE_OTHER): Payer: Medicare Other | Admitting: Physician Assistant

## 2021-07-14 VITALS — BP 141/76 | HR 64 | Temp 97.7°F | Resp 20 | Ht 70.0 in | Wt 163.0 lb

## 2021-07-14 DIAGNOSIS — I6523 Occlusion and stenosis of bilateral carotid arteries: Secondary | ICD-10-CM

## 2021-07-14 NOTE — Progress Notes (Signed)
Carotid Artery Follow-Up   VASCULAR SURGERY ASSESSMENT & PLAN:   [name] is a 80 y.o. male status post left carotid endarterectomy for symptomatic 50% lesion.  Contralateral side less than 50% stenosis by CT angio.  Bilateral carotid artery stenosis: The patient has has had recurrent TIAs symptoms not referable to carotid artery stenosis.  He is compliant with apixaban BID for pAF. Duplex examination today is stable with estimated ICA stenosis of 1-39% bilaterally.  We reviewed the signs and symptoms of stroke/TIA and advised the patient to call EMS should these occur.  (As he has demonstrated in the past.) Continue optimal medical management of hypertension and follow-up with primary care physician. Non-smoker. Continue the following medications: asa, statin Follow-up in 1 year with carotid duplex ultrasound.  SUBJECTIVE:   Patient reports having several episodes of expressive aphasia and was evaluated in ED by stroke team:  "He presented to ED on 03/11/2021 for likely recrudescence of expressive aphasia in setting of hypotension (88/51) and AKI (per ED note).  Imaging and work-up unremarkable.  He has not had any additional events since that time.  Reports compliance on Eliquis, aspirin 81 mg daily and atorvastatin 40 mg daily without associated side effects." McCue, NP  He endorses chronic RLE weakness times years. He ambulates with a cane. The patient denies monocular blindness, facial drooping, or numbness.  PHYSICAL EXAM:   Vitals:   07/14/21 1341 07/14/21 1343  BP: (!) 152/74 (!) 141/76  Pulse: 64   Resp: 20   Temp: 97.7 F (36.5 C)   SpO2: 98%      General appearance: Well-developed, well-nourished in no apparent distress Neurologic: Alert and oriented x4, tongue is midline, face symmetric, speech fluent, 5 out of 5 bilateral upper extremity grip strength, triceps and biceps strength Cardiovascular: Heart rate and rhythm are regular.  No carotid bruits. Respirations:  Nonlabored Abdomen: No palpable pulsatile mass   NON-INVASIVE VASCULAR STUDIES  07/14/2021 Summary:  Right Carotid: Velocities in the right ICA are consistent with a 1-39%  stenosis.   Left Carotid: Velocities in the left ICA are consistent with a 1-39%  stenosis.  Patent CEA.   Vertebrals:  Bilateral vertebral arteries demonstrate antegrade flow.  Subclavians: Normal flow hemodynamics were seen in bilateral subclavian arteries.   *See table(s) above for measurements and observations.      Preliminary    PROBLEM LIST:    The patient's past medical history, past surgical history, family history, social history, allergy list and medication list are reviewed.  On apixaban for a. fib  CURRENT MEDS:    Current Outpatient Medications:    albuterol (VENTOLIN HFA) 108 (90 Base) MCG/ACT inhaler, Inhale 2 puffs into the lungs 4 (four) times daily as needed for wheezing or shortness of breath., Disp: , Rfl:    apixaban (ELIQUIS) 5 MG TABS tablet, Take 1 tablet (5 mg total) by mouth 2 (two) times daily., Disp: 60 tablet, Rfl: 1   aspirin EC 81 MG tablet, Take 1 tablet (81 mg total) by mouth daily. Swallow whole., Disp: 30 tablet, Rfl: 1   atorvastatin (LIPITOR) 40 MG tablet, Take 1 tablet (40 mg total) by mouth daily., Disp: 30 tablet, Rfl: 1   cholecalciferol (VITAMIN D) 25 MCG (1000 UNIT) tablet, Take 1,000 Units by mouth at bedtime., Disp: , Rfl:    Ferrous Sulfate (IRON) 325 (65 Fe) MG TABS, Take 1 tablet by mouth at bedtime., Disp: , Rfl:    Fluticasone Furoate (ARNUITY ELLIPTA) 200 MCG/ACT AEPB, Inhale  1 puff into the lungs every morning., Disp: , Rfl:    Magnesium 250 MG TABS, Take 250 mg by mouth every morning., Disp: , Rfl:    Multiple Vitamin (MULTIVITAMIN WITH MINERALS) TABS tablet, Take 1 tablet by mouth every morning., Disp: , Rfl:    Omega-3 Fatty Acids (FISH OIL PO), Take 1 capsule by mouth every morning., Disp: , Rfl:    omeprazole (PRILOSEC) 40 MG capsule, Take 40 mg by  mouth daily before supper., Disp: , Rfl:    REVIEW OF SYSTEMS:   [X]  denotes positive finding, [ ]  denotes negative finding Cardiac  Comments:  Chest pain or chest pressure:    Shortness of breath upon exertion:    Short of breath when lying flat:    Irregular heart rhythm:        Vascular    Pain in calf, thigh, or hip brought on by ambulation:    Pain in feet at night that wakes you up from your sleep:     Blood clot in your veins:    Leg swelling:         Pulmonary    Oxygen at home:    Productive cough:     Wheezing:         Neurologic    Sudden weakness in arms or legs:     Sudden numbness in arms or legs:     Sudden onset of difficulty speaking or slurred speech: x   Temporary loss of vision in one eye:     Problems with dizziness:         Gastrointestinal    Blood in stool:     Vomited blood:         Genitourinary    Burning when urinating:     Blood in urine:        Psychiatric    Major depression:         Hematologic    Bleeding problems:    Problems with blood clotting too easily:        Skin    Rashes or ulcers:        Constitutional    Fever or chills:     , PA-C  Office: [phone] 07/14/2021 Dr. [phone]

## 2021-07-19 NOTE — Progress Notes (Signed)
Remote pacemaker transmission.   

## 2021-08-04 ENCOUNTER — Other Ambulatory Visit: Payer: Self-pay

## 2021-08-04 ENCOUNTER — Ambulatory Visit (INDEPENDENT_AMBULATORY_CARE_PROVIDER_SITE_OTHER): Payer: Medicare Other | Admitting: Neurology

## 2021-08-04 ENCOUNTER — Encounter: Payer: Self-pay | Admitting: Neurology

## 2021-08-04 VITALS — BP 138/68 | HR 62 | Ht 70.0 in | Wt 163.2 lb

## 2021-08-04 DIAGNOSIS — Z950 Presence of cardiac pacemaker: Secondary | ICD-10-CM

## 2021-08-04 DIAGNOSIS — I6523 Occlusion and stenosis of bilateral carotid arteries: Secondary | ICD-10-CM

## 2021-08-04 DIAGNOSIS — Z8673 Personal history of transient ischemic attack (TIA), and cerebral infarction without residual deficits: Secondary | ICD-10-CM | POA: Diagnosis not present

## 2021-08-04 DIAGNOSIS — R351 Nocturia: Secondary | ICD-10-CM

## 2021-08-04 DIAGNOSIS — I480 Paroxysmal atrial fibrillation: Secondary | ICD-10-CM

## 2021-08-04 DIAGNOSIS — R0683 Snoring: Secondary | ICD-10-CM | POA: Diagnosis not present

## 2021-08-04 DIAGNOSIS — R0681 Apnea, not elsewhere classified: Secondary | ICD-10-CM

## 2021-08-04 DIAGNOSIS — Z9889 Other specified postprocedural states: Secondary | ICD-10-CM

## 2021-08-04 NOTE — Patient Instructions (Addendum)

## 2021-08-04 NOTE — Progress Notes (Signed)
Subjective:    Patient ID: [name] is a 80 y.o. male.  HPI    Huston Foley, MD, PhD [facility] [address], [address] [address] [place], [place] [address]  Dear [name] and [name],   I saw your patient, [name], upon your kind request in my sleep clinic today for initial consultation of his sleep disorder, in particular, concern for underlying obstructive sleep apnea.  The patient is unaccompanied today.  As you know, [name] is an 80 year old right-handed gentleman with an underlying complex medical history of TIA, atrial flutter, bradycardia with status post pacemaker placement in 2016, hyperlipidemia, prediabetes, hypertension, carotid artery disease with status post left CEA, and reflux disease, who reports snoring and witnessed apneas per wife's report.  He reports that he sleeps on a wedge and with an adjustable bed, we estimated that his head of bed is elevated about 30 degrees.  His snoring is better when he sleeps on an incline.  He reports that his son who lives in [place] has a CPAP machine.  Patient has been using an over-the-counter mouthpiece to alleviate his snoring which helps a little bit.  He had a home sleep test something 5 or 6 years ago with either borderline findings of very mild sleep apnea.  He was not offered CPAP therapy at the time.  He would be willing to get rechecked, would probably prefer to do testing at home.  He has had significant nocturia, on average 5-6 times per night.  He has not seen a urologist but his primary care has checked his PSA regularly.  He reports that his PSA had crept up a little bit.  He moved from [place] about a year ago.  He is and his wife lives in a retirement community in independent living.  Daughter lives in [place] and son is in [place].  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  He quit smoking some 55 years ago.  He drinks alcohol about twice a  week at the most.  He drinks caffeine in the form of 1 cup of coffee, typically 1 large cup in the morning.  Bedtime is generally around 1130 and rise time around 8.  He does not typically nap.   I reviewed your office note from 06/01/2021.  His Epworth sleepiness score is 0 out of 24, fatigue severity score is 12 out of 63.  He had a tonsillectomy at age 33.  His Past Medical History Is Significant For: Past Medical History:  Diagnosis Date   Carotid artery occlusion    GERD (gastroesophageal reflux disease)    Hypertension    Stroke (HCC) 02/2021   TIA    His Past Surgical History Is Significant For: Past Surgical History:  Procedure Laterality Date   CATARACT EXTRACTION, BILATERAL     ENDARTERECTOMY Left 09/16/2020   Procedure: LEFT ENDARTERECTOMY CAROTID;  Surgeon: Maeola Harman, MD;  Location: [facility] OR;  Service: Vascular;  Laterality: Left;   HERNIA REPAIR     PACEMAKER IMPLANT     TONSILLECTOMY      His Family History Is Significant For: Family History  Family history unknown: Yes    His Social History Is Significant For: Social History   Socioeconomic History   Marital status: Married    Spouse name: [name]   Number of children: Not on file   Years of education: Not on file   Highest education level: Not on  file  Occupational History   Not on file  Tobacco Use   Smoking status: Former   Smokeless tobacco: Never  Vaping Use   Vaping Use: Never used  Substance and Sexual Activity   Alcohol use: Yes    Comment: occassional   Drug use: Not on file   Sexual activity: Not Currently  Other Topics Concern   Not on file  Social History Narrative   Lives with wife   Social Determinants of Health   Financial Resource Strain: Low Risk    Difficulty of Paying Living Expenses: Not hard at all  Food Insecurity: No Food Insecurity   Worried About Programme researcher, broadcasting/film/video in the Last Year: Never true   Ran Out of Food in the Last Year: Never true  Transportation  Needs: No Transportation Needs   Lack of Transportation (Medical): No   Lack of Transportation (Non-Medical): No  Physical Activity: Insufficiently Active   Days of Exercise per Week: 5 days   Minutes of Exercise per Session: 10 min  Stress: No Stress Concern Present   Feeling of Stress : Not at all  Social Connections: Moderately Integrated   Frequency of Communication with Friends and Family: More than three times a week   Frequency of Social Gatherings with Friends and Family: More than three times a week   Attends Religious Services: 1 to 4 times per year   Active Member of [employer] or Organizations: No   Attends Banker Meetings: Never   Marital Status: Married    His Allergies Are:  No Known Allergies:   His Current Medications Are:  Outpatient Encounter Medications as of 08/04/2021  Medication Sig   albuterol (VENTOLIN HFA) 108 (90 Base) MCG/ACT inhaler Inhale 2 puffs into the lungs 4 (four) times daily as needed for wheezing or shortness of breath.   apixaban (ELIQUIS) 5 MG TABS tablet Take 1 tablet (5 mg total) by mouth 2 (two) times daily.   aspirin EC 81 MG tablet Take 1 tablet (81 mg total) by mouth daily. Swallow whole.   atorvastatin (LIPITOR) 40 MG tablet Take 1 tablet (40 mg total) by mouth daily.   cholecalciferol (VITAMIN D) 25 MCG (1000 UNIT) tablet Take 1,000 Units by mouth at bedtime.   Ferrous Sulfate (IRON) 325 (65 Fe) MG TABS Take 1 tablet by mouth at bedtime.   Fluticasone Furoate (ARNUITY ELLIPTA) 200 MCG/ACT AEPB Inhale 1 puff into the lungs every morning.   Magnesium 250 MG TABS Take 250 mg by mouth every morning.   Multiple Vitamin (MULTIVITAMIN WITH MINERALS) TABS tablet Take 1 tablet by mouth every morning.   Omega-3 Fatty Acids (FISH OIL PO) Take 1 capsule by mouth every morning.   omeprazole (PRILOSEC) 40 MG capsule Take 40 mg by mouth daily before supper.   No facility-administered encounter medications on file as of 08/04/2021.   :   Review of Systems:  Out of a complete 14 point review of systems, all are reviewed and negative with the exception of these symptoms as listed below:   Review of Systems  Neurological:        Hx of recurrent TIA's, ? Risk for OSA. No daytime sleepiness. Witnessed apnea by wife.  ESS:0, FSS:13   Objective:  Neurological Exam  Physical Exam Physical Examination:   Vitals:   08/04/21 1053  BP: 138/68  Pulse: 62    General Examination: The patient is a very pleasant 80 y.o. male in no acute distress. He appears well-developed  and well-nourished and well groomed.   HEENT: Normocephalic, atraumatic, pupils are equal, round and reactive to light, extraocular tracking is good without limitation to gaze excursion or nystagmus noted. Hearing is grossly intact. Face is symmetric with normal facial animation. Speech is clear with no dysarthria noted. There is no hypophonia. There is no lip, neck/head, jaw or voice tremor. Neck is supple with full range of passive and active motion.  Left neck tilt noted, unremarkable carotid endarterectomy scar on the left.  No carotid bruits.  Minimal overbite noted.  Oropharynx exam reveals: mild mouth dryness, adequate dental hygiene and mild airway crowding, due to redundant soft palate.  Tonsils absent.  Mallampati class I.  Neck circumference of 16-1/2 inches.  Tongue protrudes centrally and palate elevates symmetrically.  Chest: Clear to auscultation without wheezing, rhonchi or crackles noted.  Heart: S1+S2+0, regular and normal without murmurs, rubs or gallops noted.   Abdomen: Soft, non-tender and non-distended with normal bowel sounds appreciated on auscultation.  Extremities: There is no pitting edema in the distal lower extremities bilaterally.   Skin: Warm and dry without trophic changes noted.  Chronic appearing bruises across arms and legs.  Musculoskeletal: exam reveals prominent scoliosis    Neurologically:  Mental status: The  patient is awake, alert and oriented in all 4 spheres. His immediate and remote memory, attention, language skills and fund of knowledge are appropriate. There is no evidence of aphasia, agnosia, apraxia or anomia. Speech is clear with normal prosody and enunciation. Thought process is linear. Mood is normal and affect is normal.  Cranial nerves II - XII are as described above under HEENT exam.  Motor exam: Normal bulk, strength and tone is noted. There is no tremor, fine motor skills and coordination: grossly intact.  Cerebellar testing: No dysmetria or intention tremor. There is no truncal or gait ataxia.  Sensory exam: intact to light touch in the upper and lower extremities.  Gait, station and balance: He stands without difficulty, prominent scoliosis noted with unequal shoulder height.  He walks without a walking cane, no shuffle.  Preserved arm swing noted.   Assessment and plan:  In summary, Stepehn Eckard is a very pleasant 42 y.o.-year old male with an underlying complex medical history of TIA, atrial flutter, bradycardia with status post pacemaker placement in 2016, hyperlipidemia, prediabetes, hypertension, carotid artery disease with status post left CEA, and reflux disease, whose history and physical exam are concerning for obstructive sleep apnea (OSA). I had a long chat with the patient about my findings and the diagnosis of OSA, its prognosis and treatment options. We talked about medical treatments, surgical interventions and non-pharmacological approaches. I explained in particular the risks and ramifications of untreated moderate to severe OSA, especially with respect to developing cardiovascular disease down the Road, including congestive heart failure, difficult to treat hypertension, cardiac arrhythmias, or stroke. Even type 2 diabetes has, in part, been linked to untreated OSA. Symptoms of untreated OSA include daytime sleepiness, memory problems, mood irritability and mood disorder such  as depression and anxiety, lack of energy, as well as recurrent headaches, especially morning headaches. We talked about trying to maintain a healthy lifestyle in general, as well as the importance of weight control. We also talked about the importance of good sleep hygiene. I recommended the following at this time: sleep study.  I outlined the difference between a laboratory attended sleep study versus home sleep test.  I explained possible surgical and non-surgical treatment options of OSA, including the use of  a custom-made dental device (which would require a referral to a specialist dentist or oral surgeon), upper airway surgical options, such as traditional UPPP or a novel less invasive surgical option in the form of Inspire hypoglossal nerve stimulation (which would involve a referral to an ENT surgeon). I also explained the CPAP treatment option to the patient, who indicated that he would be willing to try CPAP if the need arises. I explained the importance of being compliant with PAP treatment, not only for insurance purposes but primarily to improve His symptoms, and for the patient's long term health benefit, including to reduce His cardiovascular risks.  We will pick up our discussion after testing.  He is advised not to use his mouthpiece at the time of his sleep test either in the lab or at home.  He is also advised that inspire is a surgical procedure and most insurances only cover inspire once patient has tried and failed PAP therapy. I plan to see him back after testing.  I answered all his questions today and he was in agreement.  Thank you very much for allowing me to participate in the care of this nice patient. If I can be of any further assistance to you please do not hesitate to talk to me.   Sincerely,   Huston Foley, MD, PhD

## 2021-08-11 ENCOUNTER — Telehealth: Payer: Self-pay

## 2021-08-11 NOTE — Telephone Encounter (Signed)
Spoke with patient about scheduling sleep study and he said he would call back to schedule once he has transportation sorted out.

## 2021-09-01 ENCOUNTER — Telehealth: Payer: Self-pay | Admitting: Family Medicine

## 2021-09-01 NOTE — Telephone Encounter (Signed)
Patient called and asked for a message to be left for Dr. [place] concerning a prescription renewal. Patient would like someone to let the pharmacist know at  [id number] - [place], [place] - [address] AT [facility] [address] [facility] [address] Phone:  [phone]  Fax:  [phone]    That he needs a prescription sent for Fluticasone Furoate (ARNUITY ELLIPTA) 200 MCG/ACT AEPB . He usually gets a three month supply of the medication at a time.  Patient also says that it has been a while since he has had a pneumonia shot and is wanting to know if he is eligible for the shot soon. He says that he is in no rush but would like to schedule if he is ready for one.  Please advise.

## 2021-09-02 MED ORDER — ARNUITY ELLIPTA 200 MCG/ACT IN AEPB: 1.0000 | INHALATION_SPRAY | Freq: Every morning | RESPIRATORY_TRACT | 3 refills | Status: DC

## 2021-09-02 NOTE — Telephone Encounter (Signed)
Can you get him set up for a follow up visit? I will give him his pneumonia vaccine during that visit. Thank you!

## 2021-09-02 NOTE — Telephone Encounter (Signed)
It is okay for him to get Prevnar 20. He is due for follow-up. Thanks, BJ

## 2021-09-02 NOTE — Telephone Encounter (Signed)
Pt had Pneumovax on 11/10/15, okay to give Prevnar 20?

## 2021-09-02 NOTE — Telephone Encounter (Signed)
Pt is sch for 09-06-2021 at 10 am

## 2021-09-05 ENCOUNTER — Telehealth (INDEPENDENT_AMBULATORY_CARE_PROVIDER_SITE_OTHER): Payer: Medicare Other | Admitting: Family Medicine

## 2021-09-05 ENCOUNTER — Encounter: Payer: Self-pay | Admitting: Family Medicine

## 2021-09-05 VITALS — BP 123/74 | HR 72 | Temp 97.4°F | Ht 70.0 in

## 2021-09-05 DIAGNOSIS — R197 Diarrhea, unspecified: Secondary | ICD-10-CM

## 2021-09-05 NOTE — Progress Notes (Addendum)
Virtual Visit via Telephone Note I connected with [name] on 09/05/21 at  3:30 PM EDT by telephone and verified that I am speaking with the correct person using two identifiers.   I discussed the limitations, risks, security and privacy concerns of performing an evaluation and management service by telephone and the availability of in person appointments. I also discussed with the patient that there may be a patient responsible charge related to this service. The patient expressed understanding and agreed to proceed.  Location patient: home Location provider: work office Participants present for the call: patient, wife,and provider Patient did not have a visit in the prior 7 days to address this/these issue(s).  Chief Complaint  Patient presents with   digestive issues   History of Present Illness: [name] is a 80 y.o.male with hx of TIA, PAD, atrial fibrillation, hypertension, and HFpEF c/o 3 days of watery stools.  Today x 3 stools, mostly watery. Yesterday x 3-4. He has not seen mucus or blood in stool. Initially he had abdominal cramps, resolved. No associated nausea or vomiting. Exacerbated by food intake. He decreased oral intake,mostly on a liquid/soft diet. He has lost some wt. + Fatigue and some chills.  Negative for fever, sore throat, nasal congestion,anosmia,ageusia, rhinorrhea, CP, dyspnea, cough,wheezing, dysuria, urinary frequency, gross hematuria, foamy urine, decreased urine output, body aches,or skin rash. He has tried OTC Kaopectate, which is not helping. He has increased water intake.  No known sick contact. No recent travel. Negative for dietary changes or new medication. Denies abx treatment within the past few months. COVID 19 home test negative 2 days ago.  Observations/Objective: Patient sounds cheerful and well on the phone. I do not appreciate any SOB. Speech and thought processing are grossly intact. Patient reported vitals:BP 123/74    Pulse 72   Temp (!) 97.4 F (36.3 C)   Ht 5\' 10"  (1.778 m)   BMI 23.42 kg/m   Assessment and Plan: 1. Diarrhea, unspecified type We discussed possible etiologies, ? Viral colitis. Symptomatic treatment recommended for now. I do not think further work-up is necessary at this time. Contact precautions and hand hygiene. Oral hydration, Gatorade or Pedialyte are good options. Bland and light diet if tolerated. Clearly instructed about warning signs.  He was instructed to let me know if he is still having diarrhea in 7 days, before if symptoms get worse.  Follow Up Instructions:  Return if symptoms worsen or fail to improve.  I did not refer this patient for an OV in the next 24 hours for this/these issue(s).  I discussed the assessment and treatment plan with the patient. [name] and his wife were provided an opportunity to ask questions and all were answered. He agreed with the plan and demonstrated an understanding of the instructions.   The patient was advised to call back or seek an in-person evaluation if the symptoms worsen or if the condition fails to improve as anticipated.  I provided 18 minutes of non-face-to-face time during this encounter.  [name] [facility], MD

## 2021-09-06 ENCOUNTER — Ambulatory Visit: Payer: Medicare Other | Admitting: Family Medicine

## 2021-09-07 ENCOUNTER — Ambulatory Visit (INDEPENDENT_AMBULATORY_CARE_PROVIDER_SITE_OTHER): Payer: Medicare Other | Admitting: Adult Health

## 2021-09-07 ENCOUNTER — Encounter: Payer: Self-pay | Admitting: Adult Health

## 2021-09-07 ENCOUNTER — Other Ambulatory Visit: Payer: Self-pay

## 2021-09-07 VITALS — BP 137/74 | HR 62 | Ht 70.0 in | Wt 158.4 lb

## 2021-09-07 DIAGNOSIS — Z8673 Personal history of transient ischemic attack (TIA), and cerebral infarction without residual deficits: Secondary | ICD-10-CM | POA: Diagnosis not present

## 2021-09-07 DIAGNOSIS — I10 Essential (primary) hypertension: Secondary | ICD-10-CM

## 2021-09-07 DIAGNOSIS — Z9889 Other specified postprocedural states: Secondary | ICD-10-CM | POA: Diagnosis not present

## 2021-09-07 DIAGNOSIS — E785 Hyperlipidemia, unspecified: Secondary | ICD-10-CM | POA: Diagnosis not present

## 2021-09-07 DIAGNOSIS — I480 Paroxysmal atrial fibrillation: Secondary | ICD-10-CM

## 2021-09-07 DIAGNOSIS — Z9189 Other specified personal risk factors, not elsewhere classified: Secondary | ICD-10-CM

## 2021-09-07 NOTE — Progress Notes (Signed)
[facility] [address] [place]. [place] [address] [phone]       STROKE FOLLOW UP NOTE  [name] Date of Birth:  [birth year] Medical Record Number:  [id number]   Reason for Referral: f/u recent TIA vs syncopal event     SUBJECTIVE:   CHIEF COMPLAINT:  Chief Complaint  Patient presents with   RM 3 follow-up    Here with wife, [name]. Pt denies any recurrent issues since last TIA in April. Feels he's doing well overall. Uses cane. An elevator door knocked him down in April, but otherwise no falls.     HPI:   Update 09/07/2021 JM: Returns for 56-month stroke follow-up accompanied by his wife.  Doing well since prior visit.  No reoccurring or new stroke/TIA symptoms since prior visit.  Denies any recent falls.  Compliant on aspirin, Eliquis and atorvastatin -denies side effects.  Routinely followed by PCP, cardiology and vascular surgery.  Evaluated by Dr. Frances Furbish 08/04/2021 and scheduled for sleep study on 11/16. No new concerns at this time.      History provided for reference purposes only Update 06/01/2021 JM: [name] returns for follow-up visit regarding recurrent TIAs.  Since he was last seen 5 months ago, he presented to ED on 03/11/2021 for likely recrudescence of expressive aphasia in setting of hypotension (88/51) and AKI (per ED note).  Imaging and work-up unremarkable.  He has not had any additional events since that time.  Reports compliance on Eliquis, aspirin 81 mg daily and atorvastatin 40 mg daily without associated side effects.  Blood pressure today 124/66. Routinely monitors at home and has been stable. Has follow up with Dr. Randie Heinz on 9/1 for carotid duplex. He is interested in pursing sleep evaluation at this time. No further concerns at this time.   Update 01/06/2021 JM: [name] is being seen for recent TIA hospital follow-up.  He presented to [facility] ED on 12/24/2020 with notes, lab work and imaging personally reviewed with reported dizziness  and lightheadedness and difficulty speaking after a fall.  He has had prior TIAs with speech difficulty with this event similar to prior episodes but not as bad and symptoms completely resolved.  Evaluated by Dr. Pearlean Brownie with CT head negative and CTA L carotid endarterectomy patent without significant stenosis.  MRI unable to be obtained due to pacer.  LDL 34.  A1c 5.6.  It was felt as though his symptoms possibly more related to syncopal event vs TIA although he does have stroke risk factors including atrial flutter on Eliquis, HLD on atorvastatin, HTN and prediabetes.  Since that time, he has not had any additional or new stroke/TIA symptoms.   Reports compliance on Eliquis 5 mg twice daily, aspirin 81 mg daily and atorvastatin 40 mg daily -denies side effects Blood pressure today 142/72 - monitors at home and typically stable  No new concerns at this time  Initial visit 10/21/2020 JM: [name] is being seen for hospital follow-up accompanied by his wife.  He has been doing well since discharge without new or reoccurring stroke/TIA symptoms.  Remains on aspirin and Eliquis for secondary stroke prevention with known AF and s/p L CEA without bleeding or bruising.  Remains on atorvastatin 40 mg daily without myalgias.  Blood pressure today satisfactory 136/61.  Wife concerned regarding possible apnea with sleep study 3 years ago in [place], [place] diagnosed with mild sleep apnea per patient (unable to view via epic) and no treatment indicated.  Since that time, increased  nocturia q2-3hrs, witnessed apnea and snoring.  Use of some type of mouthguard and head elevation while sleeping which has decreased snoring and apnea. No further concerns.   Stroke admission 09/11/2020 [name] is a 80 y.o. male who recently moved to [facility] from [place] with a PMHx of Htn, GERD, ? atrial fibrillation, bradycardia s/p pacemaker in 2016.  Presented on 09/11/2020 with word finding difficulty and headache.   Personally reviewed pertinent hospitalization progress notes, lab work and imaging with summary provided.  Evaluated by Dr. Roda Shutters with likely TIA secondary to AF not on AC vs soft plaque at left ICA bulb.  MRI unable to be obtained d/t noncompatible pacer.  Pacer interrogation +SVT and short run of a flutter.  Questionable history of A. fib which was confirmed by prior cardiologist but low burden therefore AC not started.  In setting of recent TIA, initiated Eliquis for secondary stroke prevention. Hx of HTN stable and resumed home dose metoprolol. Hx of HLD on pravastatin with LDL 58.  Other stroke risk factors include advanced age, EtOH use and family history of strokes but no personal prior history of strokes.  Other active problems include anemia, aortic arthrosclerosis, bradycardia s/p pacer and GERD.  Evaluated by therapies without additional therapy needs and discharged home in stable condition on 09/14/2020.  He returned shortly after discharge on 09/14/2020 with recurrence of speech and language difficulty.  CTA head/neck repeated negative LVO or worsening stenosis with stable fibrofatty plaque of left ICA origin.  Vascular surgery consulted and underwent left CEA without complication.  Initiated aspirin in addition to Eliquis at discharge.  Symptoms resolved and discharged home without therapy needs on 09/17/2020.    09/11/2020 TIA secondary to AF not on AC vs. Soft plaque at left ICA bulb CT head - no acute abnormality MRI head - not able to do due to pacer not compatible with MRI CTA H&N - No emergent large vessel occlusion or high-grade stenosis of the intracranial arteries. Eccentric plaque within the proximal left internal carotid artery may be at increased risk of embolus formation. Bilateral carotid bifurcation atherosclerosis without hemodynamically significant stenosis by NASCET criteria.  CT repeat Unremarkable  2D Echo - EF 55-60%. No source of embolus  Pacemaker interrogation +SVT, short  run of Aflutter per Dr. Shelle Iron Virus 2 - neg LDL - 58 HgbA1c - 5.4 UDS - negative VTE prophylaxis - Lovenox aspirin 81 mg daily prior to admission, now on aspirin 81 mg daily and clopidogrel 75 mg daily DAPT  Therapy recommendations:  None Disposition:  home  09/14/2020 recurrent TIA likely due to high risk soft plaque at left ICA bulb s/p left CEA CT head code stroke - no acute abnormality. ASPECTS 10 CTA H&N repeat - stable. No LVO or significant stenosis. Irregular L ICA plaque at origin, unchanged.  S/p L CEA for high risk soft plaque left ICA LDL - 58 -initiated atorvastatin 40 mg daily HgbA1c - 5.4 UDS - negative VTE prophylaxis - Eliquis aspirin 81 mg daily prior to admission, on ASA 81mg  post left CEA. Will be discharged with ASA 81 and eliquis 5mg  bid.      ROS:   14 system review of systems performed and negative with exception of those listed in HPI  PMH:  Past Medical History:  Diagnosis Date   Carotid artery occlusion    GERD (gastroesophageal reflux disease)    Hypertension    Stroke (HCC) 02/2021   TIA    PSH:  Past Surgical History:  Procedure Laterality Date   CATARACT EXTRACTION, BILATERAL     ENDARTERECTOMY Left 09/16/2020   Procedure: LEFT ENDARTERECTOMY CAROTID;  Surgeon: Maeola Harman, MD;  Location: [facility];  Service: Vascular;  Laterality: Left;   HERNIA REPAIR     PACEMAKER IMPLANT     TONSILLECTOMY      Social History:  Social History   Socioeconomic History   Marital status: Married    Spouse name: Pat   Number of children: Not on file   Years of education: Not on file   Highest education level: Not on file  Occupational History   Not on file  Tobacco Use   Smoking status: Former   Smokeless tobacco: Never  Vaping Use   Vaping Use: Never used  Substance and Sexual Activity   Alcohol use: Yes    Comment: occassional   Drug use: Never   Sexual activity: Not Currently  Other Topics Concern   Not on  file  Social History Narrative   Lives with wife   Social Determinants of Health   Financial Resource Strain: Low Risk    Difficulty of Paying Living Expenses: Not hard at all  Food Insecurity: No Food Insecurity   Worried About Programme researcher, broadcasting/film/video in the Last Year: Never true   Ran Out of Food in the Last Year: Never true  Transportation Needs: No Transportation Needs   Lack of Transportation (Medical): No   Lack of Transportation (Non-Medical): No  Physical Activity: Insufficiently Active   Days of Exercise per Week: 5 days   Minutes of Exercise per Session: 10 min  Stress: No Stress Concern Present   Feeling of Stress : Not at all  Social Connections: Moderately Integrated   Frequency of Communication with Friends and Family: More than three times a week   Frequency of Social Gatherings with Friends and Family: More than three times a week   Attends Religious Services: 1 to 4 times per year   Active Member of [employer] or Organizations: No   Attends Engineer, structural: Never   Marital Status: Married  Catering manager Violence: Not At Risk   Fear of Current or Ex-Partner: No   Emotionally Abused: No   Physically Abused: No   Sexually Abused: No    Family History:  Family History  Family history unknown: Yes    Medications:   Current Outpatient Medications on File Prior to Visit  Medication Sig Dispense Refill   albuterol (VENTOLIN HFA) 108 (90 Base) MCG/ACT inhaler Inhale 2 puffs into the lungs 4 (four) times daily as needed for wheezing or shortness of breath.     apixaban (ELIQUIS) 5 MG TABS tablet Take 1 tablet (5 mg total) by mouth 2 (two) times daily. 60 tablet 1   aspirin EC 81 MG tablet Take 1 tablet (81 mg total) by mouth daily. Swallow whole. 30 tablet 1   atorvastatin (LIPITOR) 40 MG tablet Take 1 tablet (40 mg total) by mouth daily. 30 tablet 1   cholecalciferol (VITAMIN D) 25 MCG (1000 UNIT) tablet Take 1,000 Units by mouth at bedtime.     Ferrous  Sulfate (IRON) 325 (65 Fe) MG TABS Take 1 tablet by mouth at bedtime.     Fluticasone Furoate (ARNUITY ELLIPTA) 200 MCG/ACT AEPB Inhale 1 puff into the lungs every morning. 90 each 3   Magnesium 250 MG TABS Take 250 mg by mouth every morning.     Multiple Vitamin (MULTIVITAMIN WITH MINERALS)  TABS tablet Take 1 tablet by mouth every morning.     Omega-3 Fatty Acids (FISH OIL PO) Take 1 capsule by mouth every morning.     omeprazole (PRILOSEC) 40 MG capsule Take 40 mg by mouth daily before supper.     No current facility-administered medications on file prior to visit.    Allergies:  No Known Allergies    OBJECTIVE:  Physical Exam  Vitals:   09/07/21 1111  BP: 137/74  Pulse: 62  Weight: 158 lb 6.4 oz (71.8 kg)  Height: 5\' 10"  (1.778 m)    Body mass index is 22.73 kg/m. No results found.  General: well developed, well nourished,  very pleasant elderly Caucasian male, seated, in no evident distress Head: head normocephalic and atraumatic.   Neck: supple with no carotid or supraclavicular bruits Cardiovascular: regular rate and rhythm, no murmurs Musculoskeletal: no deformity Skin:  no rash/petichiae Vascular:  Normal pulses all extremities   Neurologic Exam Mental Status: Awake and fully alert.   Fluent speech and language.  Oriented to place and time. Recent and remote memory intact. Attention span, concentration and fund of knowledge appropriate. Mood and affect appropriate.  Cranial Nerves: Pupils equal, briskly reactive to light. Extraocular movements full without nystagmus. Visual fields full to confrontation.  HOH bilaterally. Facial sensation intact. Face, tongue, palate moves normally and symmetrically.  Motor: Normal bulk and tone. Normal strength in all tested extremity muscles. Sensory.: intact to touch , pinprick , position and vibratory sensation.  Coordination: Rapid alternating movements normal in all extremities. Finger-to-nose and heel-to-shin performed  accurately bilaterally. Gait and Station: Arises from chair without difficulty. Stance is normal. Gait demonstrates normal stride length and balance with use of cane. Reflexes: 1+ and symmetric. Toes downgoing.         ASSESSMENT: Kaian Fahs is a 80 y.o. year old male presented with aphasia on 09/11/2020 likely TIA secondary to AF not on AC vs L ICA bulb soft plaque.  Pacer interrogated which showed short run of a flutter w/ confirmed history of A. Fib (initially questioned but confirmed with cardiologist in [place]) not previously on [facility] due to low burden but in setting of recent event Eliquis initiated.  Returned on 09/14/2020 with recurrence of aphasia likely recurrent TIA in setting of high risk soft plaque of left ICA bulb s/p L CEA placed on aspirin in addition to Eliquis.  Additional event on 12/24/2020 with dizziness and speech difficulty post fall that felt more consistent with presyncopal event versus TIA and on 03/11/2021 with recrudescence of expressive aphasia likely in setting of hypotension.  Vascular risk factors include A. fib/flutter, HTN, HLD, bradycardia s/p pacer, advanced age and EtOH use.      PLAN:  Recurrent TIAs:  Continue aspirin 81 mg daily and Eliquis (apixaban) daily  and atorvastatin 40 mg daily for secondary stroke prevention.  Discussed secondary stroke prevention measures and importance of close PCP follow up for aggressive stroke risk factor management  L ICA soft plaque: s/p CEA placed on aspirin.  Recent carotid duplex 1 to 39% bilateral ICA stenosis.  Plans on follow-up with [facility] around 07/2022 A fib/flutter: On Eliquis 5 mg twice daily for CHA2DS2-VASc score 6.  Routinely followed by cardiology Dr. 08/2022  HTN: BP goal <130/90.  Controlled manage/monitor by cardiology HLD: LDL goal <70.  Prior LDL 31 (02/2021) well-controlled on atorvastatin 40 mg daily per PCP At risk for sleep apnea: Scheduled to undergo sleep study 11/16    Follow-up visit will be  scheduled after completion of sleep study   CC:  [place], Timoteo Expose, MD    I spent 32 minutes of face-to-face and non-face-to-face time with patient and wife.  This included previsit chart review, lab review, study review, order entry, electronic health record documentation, patient education regarding prior event possible TIAs, A. fib and indication for [facility], s/p CEA and indication for surveillance monitoring with [facility], evaluation for possible sleep apnea, importance of managing stroke risk factors and answered all other questions to patient and wife's satisfaction  Ihor Austin, AGNP-BC  [facility] [address] [place], [place] [address]  Phone [phone] Fax [phone] Note: This document was prepared with digital dictation and possible smart phrase technology. Any transcriptional errors that result from this process are unintentional.

## 2021-09-07 NOTE — Patient Instructions (Addendum)
Continue aspirin 81 mg daily and Eliquis (apixaban) daily  and atorvastatin 40mg  daily  for secondary stroke prevention  Continue to follow up with PCP regarding cholesterol and blood pressure management  Maintain strict control of hypertension with blood pressure goal below 130/90 and cholesterol with LDL cholesterol (bad cholesterol) goal below 70 mg/dL.   Complete sleep study 11/16     We will plan on following up after sleep study      Thank you for coming to see 12/16 at [facility]. I hope we have been able to provide you high quality care today.  You may receive a patient satisfaction survey over the next few weeks. We would appreciate your feedback and comments so that we may continue to improve ourselves and the health of our patients.

## 2021-09-16 ENCOUNTER — Telehealth: Payer: Self-pay | Admitting: Family Medicine

## 2021-09-16 MED ORDER — OMEPRAZOLE 40 MG PO CPDR
40.0000 mg | DELAYED_RELEASE_CAPSULE | Freq: Every day | ORAL | 2 refills | Status: DC
Start: 2021-09-16 — End: 2022-06-12

## 2021-09-16 NOTE — Telephone Encounter (Signed)
Patient called stating that he wanted a message given to Dr. Sw[place]il[place] letting her know that he is needing a refill on omeprazole (PRILOSEC) 40 MG capsule. He wants it sent to [id number] - [place], [place] - [address] AT [address] [facility] [address]  Patient says that he would like to be able to pick up the prescription by Tuesday.  Patient's contact number is [phone].  Please advise.

## 2021-09-16 NOTE — Telephone Encounter (Signed)
Rx sent in as requested. 

## 2021-09-21 ENCOUNTER — Encounter: Payer: Self-pay | Admitting: Family Medicine

## 2021-09-21 ENCOUNTER — Ambulatory Visit (INDEPENDENT_AMBULATORY_CARE_PROVIDER_SITE_OTHER): Payer: Medicare Other | Admitting: Family Medicine

## 2021-09-21 ENCOUNTER — Other Ambulatory Visit: Payer: Self-pay

## 2021-09-21 VITALS — BP 150/70 | HR 62 | Temp 97.9°F | Resp 16 | Ht 70.0 in | Wt 162.2 lb

## 2021-09-21 DIAGNOSIS — J4520 Mild intermittent asthma, uncomplicated: Secondary | ICD-10-CM

## 2021-09-21 DIAGNOSIS — I10 Essential (primary) hypertension: Secondary | ICD-10-CM

## 2021-09-21 DIAGNOSIS — I6523 Occlusion and stenosis of bilateral carotid arteries: Secondary | ICD-10-CM | POA: Diagnosis not present

## 2021-09-21 DIAGNOSIS — I480 Paroxysmal atrial fibrillation: Secondary | ICD-10-CM | POA: Diagnosis not present

## 2021-09-21 MED ORDER — ALBUTEROL SULFATE HFA 108 (90 BASE) MCG/ACT IN AERS: 2.0000 | INHALATION_SPRAY | Freq: Four times a day (QID) | RESPIRATORY_TRACT | 1 refills | Status: DC | PRN

## 2021-09-21 NOTE — Assessment & Plan Note (Addendum)
Problem is well controlled. Continue Arnuity ellipta 200 mcg daily. Albuterol inh 2 puff q 4-6 hours prn.

## 2021-09-21 NOTE — Assessment & Plan Note (Signed)
BP re-checked 150/90. Home BP readings are better. Continue non pharmacologic treatment. We discussed possible complications of elevated BP. Goal < 150/90. Continue low salt diet. Instructed about warning signs.

## 2021-09-21 NOTE — Assessment & Plan Note (Addendum)
Rhythm and rate controlled today. Having difficulties affording Eliquis, samples given today. Continue following with cardiologist.

## 2021-09-21 NOTE — Progress Notes (Signed)
HPI: [name] is a 80 y.o. male with hx of HTN,TIA,atrial fib on chronic anticoagulation, and PAD here today for chronic disease management.  Last seen on 09/05/21, virtual visit. Since his last visit he has seen neurologist and cardiologist. No new problems. Sleep study  scheduled for 09/28/21.  Asthma: He does not need Albuterol inh as far as he does not engage intense physical activity. He is on Arnulity Ellipta 200 mcg 1 puff daily.  Walking outdoors and doing the stationary bike a few times per week.  HTN: He is not longer on Lisinopril low dose. Atrial fib on Eliquis 5 mg bid, having difficulty affording it.  Negative for severe/frequent headache, visual changes, chest pain, dyspnea, palpitation, claudication, focal weakness, or edema. Lab Results  Component Value Date   CREATININE 1.03 04/14/2021   BUN 26 (H) 04/14/2021   NA 137 04/14/2021   K 4.5 04/14/2021   CL 104 04/14/2021   CO2 20 04/14/2021   Review of Systems  Constitutional:  Negative for activity change, appetite change, chills and fever.  Respiratory:  Negative for cough and wheezing.   Genitourinary:  Negative for decreased urine volume and hematuria.  Musculoskeletal:  Positive for arthralgias and gait problem.  Skin:  Negative for pallor and rash.  Neurological:  Negative for syncope and facial asymmetry.  Rest of ROS see pertinent positives and negatives in HPI.  Current Outpatient Medications on File Prior to Visit  Medication Sig Dispense Refill   apixaban (ELIQUIS) 5 MG TABS tablet Take 1 tablet (5 mg total) by mouth 2 (two) times daily. 60 tablet 1   atorvastatin (LIPITOR) 40 MG tablet Take 1 tablet (40 mg total) by mouth daily. 30 tablet 1   cholecalciferol (VITAMIN D) 25 MCG (1000 UNIT) tablet Take 1,000 Units by mouth at bedtime.     Ferrous Sulfate (IRON) 325 (65 Fe) MG TABS Take 1 tablet by mouth at bedtime.     Fluticasone Furoate (ARNUITY ELLIPTA) 200 MCG/ACT AEPB Inhale 1 puff  into the lungs every morning. 90 each 3   Magnesium 250 MG TABS Take 250 mg by mouth every morning.     Multiple Vitamin (MULTIVITAMIN WITH MINERALS) TABS tablet Take 1 tablet by mouth every morning.     Omega-3 Fatty Acids (FISH OIL PO) Take 1 capsule by mouth every morning.     omeprazole (PRILOSEC) 40 MG capsule Take 1 capsule (40 mg total) by mouth daily before supper. 90 capsule 2   No current facility-administered medications on file prior to visit.   Past Medical History:  Diagnosis Date   Carotid artery occlusion    GERD (gastroesophageal reflux disease)    Hypertension    Stroke ([place]) 02/2021   TIA   No Known Allergies  Social History   Socioeconomic History   Marital status: Married    Spouse name: Electrical engineer   Number of children: Not on file   Years of education: Not on file   Highest education level: Not on file  Occupational History   Not on file  Tobacco Use   Smoking status: Former   Smokeless tobacco: Never  Vaping Use   Vaping Use: Never used  Substance and Sexual Activity   Alcohol use: Yes    Comment: occassional   Drug use: Never   Sexual activity: Not Currently  Other Topics Concern   Not on file  Social History Narrative   Lives with wife   Social Determinants of Health  Financial Resource Strain: Low Risk    Difficulty of Paying Living Expenses: Not hard at all  Food Insecurity: No Food Insecurity   Worried About Programme researcher, broadcasting/film/video in the Last Year: Never true   Ran Out of Food in the Last Year: Never true  Transportation Needs: No Transportation Needs   Lack of Transportation (Medical): No   Lack of Transportation (Non-Medical): No  Physical Activity: Insufficiently Active   Days of Exercise per Week: 5 days   Minutes of Exercise per Session: 10 min  Stress: No Stress Concern Present   Feeling of Stress : Not at all  Social Connections: Moderately Integrated   Frequency of Communication with Friends and Family: More than three times a week    Frequency of Social Gatherings with Friends and Family: More than three times a week   Attends Religious Services: 1 to 4 times per year   Active Member of Clubs or Organizations: No   Attends Banker Meetings: Never   Marital Status: Married    Vitals:   09/21/21 1013 09/21/21 1040  BP: (!) 168/64 (!) 150/70  Pulse: 62   Resp: 16   Temp: 97.9 F (36.6 C)   SpO2: 99%    Body mass index is 23.27 kg/m.  Physical Exam Vitals and nursing note reviewed.  Constitutional:      General: He is not in acute distress.    Appearance: He is well-developed.  HENT:     Head: Normocephalic and atraumatic.     Ears:     Comments: Hearing aids. Eyes:     Conjunctiva/sclera: Conjunctivae normal.  Cardiovascular:     Rate and Rhythm: Normal rate and regular rhythm.     Pulses:          Dorsalis pedis pulses are 2+ on the right side and 2+ on the left side.     Heart sounds: No murmur heard. Pulmonary:     Effort: Pulmonary effort is normal. No respiratory distress.     Breath sounds: Normal breath sounds.  Abdominal:     Palpations: Abdomen is soft. There is no hepatomegaly or mass.     Tenderness: There is no abdominal tenderness.  Musculoskeletal:     Thoracic back: Scoliosis present.  Lymphadenopathy:     Cervical: No cervical adenopathy.  Skin:    General: Skin is warm.     Findings: No erythema or rash.  Neurological:     Mental Status: He is alert and oriented to person, place, and time.     Cranial Nerves: No cranial nerve deficit.     Comments: Unstable gait assisted by a cane.  Psychiatric:     Comments: Well groomed, good eye contact.   ASSESSMENT AND PLAN:  [name] was seen today for follow-up.  Diagnoses and all orders for this visit:  Essential hypertension BP re-checked 150/90. Home BP readings are better. Continue non pharmacologic treatment. We discussed possible complications of elevated BP. Goal < 150/90. Continue low salt  diet. Instructed about warning signs.  PAF (paroxysmal atrial fibrillation) (HCC) Rhythm and rate controlled today. Having difficulties affording Eliquis, samples given today. Continue following with cardiologist.  Asthma in adult, mild intermittent, uncomplicated Problem is well controlled. Continue Arnuity ellipta 200 mcg daily. Albuterol inh 2 puff q 4-6 hours prn.   Return in about 1 year (around 09/21/2022).  Leiann Sporer G. [place], MD  [facility]. [place] office.

## 2021-09-21 NOTE — Patient Instructions (Signed)
A few things to remember from today's visit:  Essential hypertension  PAF (paroxysmal atrial fibrillation) (HCC)  Asthma in adult, mild intermittent, uncomplicated - Plan: albuterol (VENTOLIN HFA) 108 (90 Base) MCG/ACT inhaler  If you need refills please call your pharmacy. Do not use My Chart to request refills or for acute issues that need immediate attention.   Continue monitoring blood pressure. Goal under 150/90. No changes in asthma medications.  Fall precautions. Samples of eliquis given today.  Please be sure medication list is accurate. If a new problem present, please set up appointment sooner than planned today.

## 2021-09-27 ENCOUNTER — Ambulatory Visit (INDEPENDENT_AMBULATORY_CARE_PROVIDER_SITE_OTHER): Payer: Medicare Other

## 2021-09-27 DIAGNOSIS — I480 Paroxysmal atrial fibrillation: Secondary | ICD-10-CM | POA: Diagnosis not present

## 2021-09-27 LAB — CUP PACEART REMOTE DEVICE CHECK
Battery Remaining Longevity: 55 mo
Battery Remaining Percentage: 45 %
Battery Voltage: 2.96 V
Brady Statistic AP VP Percent: 5.1 %
Brady Statistic AP VS Percent: 93 %
Brady Statistic AS VP Percent: 1 %
Brady Statistic AS VS Percent: 1.4 %
Brady Statistic RA Percent Paced: 96 %
Brady Statistic RV Percent Paced: 5.2 %
Date Time Interrogation Session: 20221115020014
Implantable Lead Implant Date: 20160928
Implantable Lead Implant Date: 20160928
Implantable Lead Location: 753859
Implantable Lead Location: 753860
Implantable Lead Model: 1948
Implantable Pulse Generator Implant Date: 20160928
Lead Channel Impedance Value: 390 Ohm
Lead Channel Impedance Value: 550 Ohm
Lead Channel Pacing Threshold Amplitude: 0.625 V
Lead Channel Pacing Threshold Amplitude: 1 V
Lead Channel Pacing Threshold Pulse Width: 0.4 ms
Lead Channel Pacing Threshold Pulse Width: 0.4 ms
Lead Channel Sensing Intrinsic Amplitude: 2.2 mV
Lead Channel Sensing Intrinsic Amplitude: 2.3 mV
Lead Channel Setting Pacing Amplitude: 1.25 V
Lead Channel Setting Pacing Amplitude: 1.625
Lead Channel Setting Pacing Pulse Width: 0.4 ms
Lead Channel Setting Sensing Sensitivity: 0.7 mV
Pulse Gen Model: 2240
Pulse Gen Serial Number: 7817736

## 2021-09-28 ENCOUNTER — Ambulatory Visit (INDEPENDENT_AMBULATORY_CARE_PROVIDER_SITE_OTHER): Payer: Medicare Other | Admitting: Neurology

## 2021-09-28 ENCOUNTER — Other Ambulatory Visit: Payer: Self-pay

## 2021-09-28 DIAGNOSIS — R0683 Snoring: Secondary | ICD-10-CM

## 2021-09-28 DIAGNOSIS — Z950 Presence of cardiac pacemaker: Secondary | ICD-10-CM

## 2021-09-28 DIAGNOSIS — R351 Nocturia: Secondary | ICD-10-CM

## 2021-09-28 DIAGNOSIS — Z9889 Other specified postprocedural states: Secondary | ICD-10-CM

## 2021-09-28 DIAGNOSIS — G4761 Periodic limb movement disorder: Secondary | ICD-10-CM

## 2021-09-28 DIAGNOSIS — R9431 Abnormal electrocardiogram [ECG] [EKG]: Secondary | ICD-10-CM

## 2021-09-28 DIAGNOSIS — G4720 Circadian rhythm sleep disorder, unspecified type: Secondary | ICD-10-CM

## 2021-09-28 DIAGNOSIS — I480 Paroxysmal atrial fibrillation: Secondary | ICD-10-CM

## 2021-09-28 DIAGNOSIS — Z8673 Personal history of transient ischemic attack (TIA), and cerebral infarction without residual deficits: Secondary | ICD-10-CM

## 2021-09-28 DIAGNOSIS — R0681 Apnea, not elsewhere classified: Secondary | ICD-10-CM

## 2021-10-04 ENCOUNTER — Telehealth: Payer: Self-pay | Admitting: *Deleted

## 2021-10-04 NOTE — Procedures (Signed)
PATIENT'S NAME:  Lawrnce, [name] DOB:      06-02-1941      MR#:    [id number]     DATE OF RECORDING: 09/28/2021 REFERRING M.D.:  Ihor Austin, NP/Dr. Delia Heady Study Performed:   Baseline Polysomnogram HISTORY: 80 year old man with an underlying complex medical history of TIA, atrial flutter, bradycardia with status post pacemaker placement in 2016, hyperlipidemia, prediabetes, hypertension, carotid artery disease with status post left CEA, and reflux disease, who reports snoring and witnessed apneas per wife's report. The patient endorsed the Epworth Sleepiness Scale at 0 points. The patient's weight 163 pounds with a height of 70 (inches), resulting in a BMI of 23.4 kg/m2. The patient's neck circumference measured 16.5 inches.  CURRENT MEDICATIONS: Ventolin HFA, Eliquis, Aspirin, Lipitor, Vitamin D, Iron, Arnuity Ellipta, Magnesium, Multivitamin with minerals, Fish oil, Prilosec   PROCEDURE:  This is a multichannel digital polysomnogram utilizing the Somnostar 11.2 system.  Electrodes and sensors were applied and monitored per AASM Specifications.   EEG, EOG, Chin and Limb EMG, were sampled at 200 Hz.  ECG, Snore and Nasal Pressure, Thermal Airflow, Respiratory Effort, CPAP Flow and Pressure, Oximetry was sampled at 50 Hz. Digital video and audio were recorded.      BASELINE STUDY  Lights Out was at 21:43 and Lights On at 04:55.  Total recording time (TRT) was 432 minutes, with a total sleep time (TST) of 260 minutes.   The patient's sleep latency was 58 minutes, which is delayed. REM latency was 204 minutes, which is delayed.  The sleep efficiency was 60.2 %.     SLEEP ARCHITECTURE: WASO (Wake after sleep onset) was 128.5 minutes with mild to moderate sleep fragmentation noted and 3 longer periods of wakefulness. There were 7.5 minutes in Stage N1, 155 minutes Stage N2, 73 minutes Stage N3 and 24.5 minutes in Stage REM.  The percentage of Stage N1 was 2.9%, Stage N2 was 59.6%, which is mildly  increased, Stage N3 was 28.1% and Stage R (REM sleep) was 9.4%, which is reduced. The arousals were noted as: 26 were spontaneous, 24 were associated with PLMs, 2 were associated with respiratory events.  RESPIRATORY ANALYSIS:  There were a total of 14 respiratory events:  8 obstructive apneas, 1 central apneas and 1 mixed apneas with a total of 10 apneas and an apnea index (AI) of 2.3 /hour. There were 4 hypopneas with a hypopnea index of .9 /hour. The patient also had 0 respiratory event related arousals (RERAs).      The total APNEA/HYPOPNEA INDEX (AHI) was 3.2/hour and the total RESPIRATORY DISTURBANCE INDEX was  3.2 /hour.  1 events occurred in REM sleep and 9 events in NREM. The REM AHI was  2.4 /hour, versus a non-REM AHI of 3.3. The patient spent 146.5 minutes of total sleep time in the supine position and 114 minutes in non-supine.. The supine AHI was 4.9 versus a non-supine AHI of 1.0.  OXYGEN SATURATION & C02:  The Wake baseline 02 saturation was 96%, with the lowest being 87%. Time spent below 89% saturation equaled 1 minutes. PERIODIC LIMB MOVEMENTS: The patient had a total of 271 Periodic Limb Movements.  The Periodic Limb Movement (PLM) index was 62.5 and the PLM Arousal index was 5.5/hour.  Audio and video analysis did not show any abnormal or unusual movements, behaviors, phonations or vocalizations. The patient took 3 bathroom breaks. Mild intermittent snoring was noted. The EKG was in keeping with normal sinus rhythm (NSR) with rare PVCs noted.  Post-study, the patient indicated that sleep was the same as usual.   IMPRESSION:  Primary snoring Periodic Limb Movement Disorder (PLMD) Dysfunctions associated with sleep stages or arousal from sleep Non-specific abnormal EKG  RECOMMENDATIONS:  This study does not demonstrate any significant obstructive or central sleep disordered breathing with the exception of mild intermittent snoring.  Severe PLMs (periodic limb movements of  sleep) were noted during this study with minimal arousals; clinical correlation is recommended.  The study showed rare PVCs on single lead EKG; clinical correlation is recommended.  This study shows sleep fragmentation and abnormal sleep stage percentages; these are nonspecific findings and per se do not signify an intrinsic sleep disorder or a cause for the patient's sleep-related symptoms. Causes include (but are not limited to) the first night effect of the sleep study, circadian rhythm disturbances, medication effect or an underlying mood disorder or medical problem.  The patient will be advised to follow up with the referring provider, who will be notified of the test results.  I certify that I have reviewed the entire raw data recording prior to the issuance of this report in accordance with the Standards of Accreditation of the American Academy of Sleep Medicine (AASM)  Star Age, MD, PhD Diplomat, American Board of Neurology and Sleep Medicine (Neurology and Sleep Medicine)

## 2021-10-04 NOTE — Telephone Encounter (Signed)
Called Patient With Sleep Study results . Gave results to patient sleep study did not show any significant obstructive sleep apnea. He had mild intermittent snoring and some difficulty maintaining sleep. Treatment with a CPAP or similar machine is not necessary.  He had evidence of leg movements during sleep, which can be seen in patients with restless legs; the leg movements did no appear to disrupt his sleep.  He had rare extra beats, PVCs, on his EKG. He is already followed by cardiology.  He can at this point FU with [name] and Dr. Pearlean Brownie as scheduled/planned.    Pt thanked me for calling .

## 2021-10-05 NOTE — Progress Notes (Signed)
Remote pacemaker transmission.   

## 2021-11-27 IMAGING — CT CT ANGIO HEAD
1 of 11 series · 5 of 33 positions shown · IV contrast (omnipaque)
Comparison: None.

CLINICAL DATA: Stroke/TIA

EXAM:
CT ANGIOGRAPHY HEAD AND NECK
TECHNIQUE: Multidetector CT imaging of the head and neck was performed using
the standard protocol during bolus administration of intravenous
contrast. Multiplanar CT image reconstructions and MIPs were
obtained to evaluate the vascular anatomy. Carotid stenosis
measurements (when applicable) are obtained utilizing NASCET
criteria, using the distal internal carotid diameter as the
denominator.
CONTRAST:  100mL OMNIPAQUE IOHEXOL 350 MG/ML SOLN

[Series 11: ax thins · axial · 0.39mm/px · z∈[-234,+18]mm · 5 of 379 slices shown]
[im 64/379  soft-tissue]
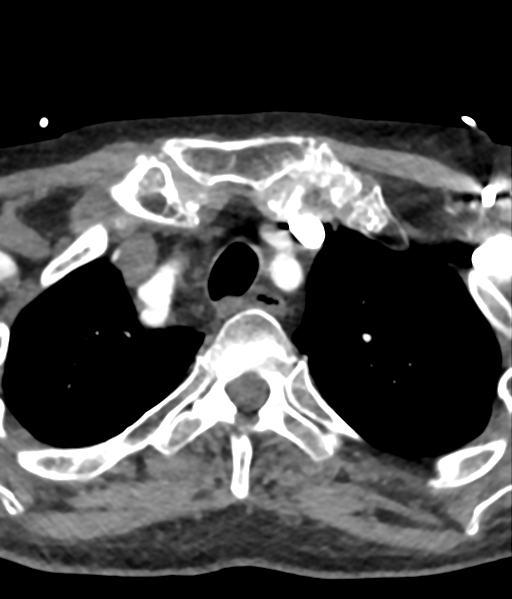
[im 127/379  bone]
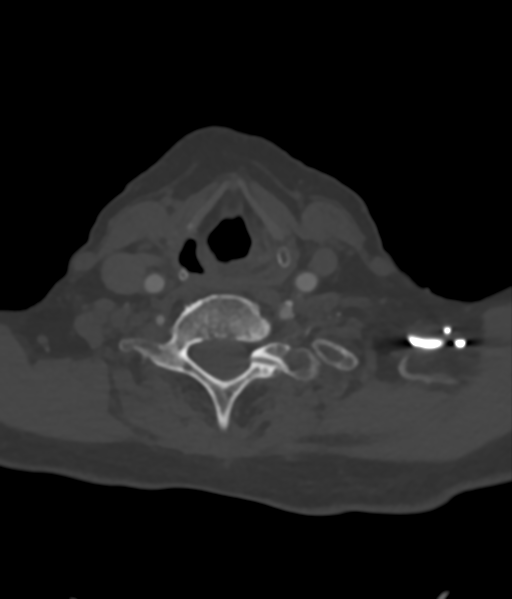
[im 190/379  soft-tissue]
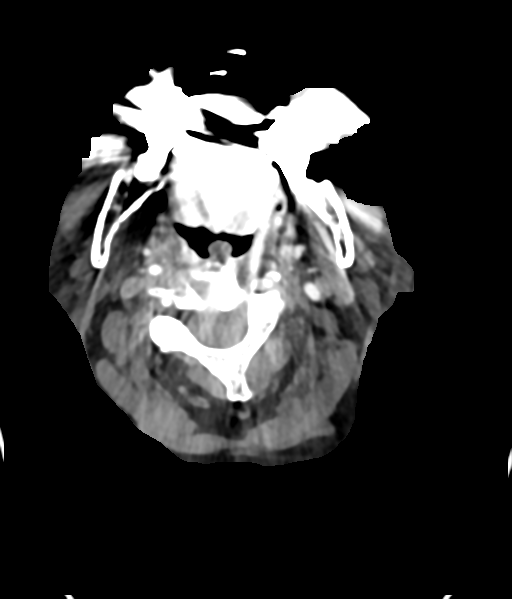
[im 253/379  bone]
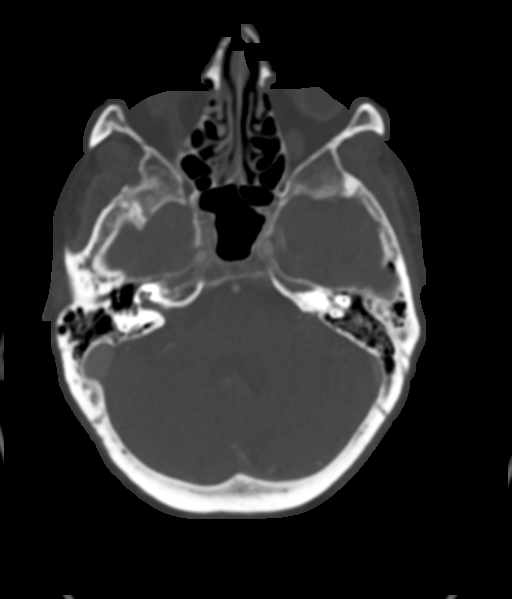
[im 316/379  soft-tissue]
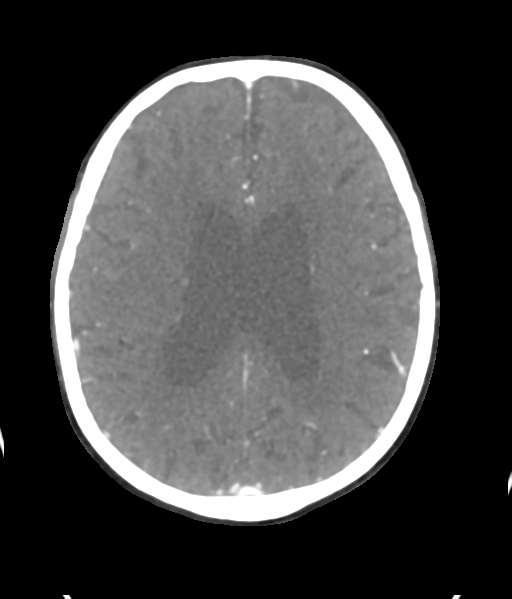

[5 of 33 positions shown; findings below may reference images not displayed]

FINDINGS: CT HEAD FINDINGS

Brain: There is no mass, hemorrhage or extra-axial collection. There
is generalized atrophy without lobar predilection. There is
hypoattenuation of the periventricular white matter, most commonly
indicating chronic ischemic microangiopathy.

Skull: The visualized skull base, calvarium and extracranial soft
tissues are normal.

Sinuses/Orbits: No fluid levels or advanced mucosal thickening of
the visualized paranasal sinuses. No mastoid or middle ear effusion.
The orbits are normal.

CTA NECK FINDINGS

SKELETON: There is no bony spinal canal stenosis. No lytic or
blastic lesion.

OTHER NECK: Enlarged heterogeneous right thyroid lobe.

UPPER CHEST: No pneumothorax or pleural effusion. No nodules or
masses.

AORTIC ARCH:

There is calcific atherosclerosis of the aortic arch. There is no
aneurysm, dissection or hemodynamically significant stenosis of the
visualized portion of the aorta. Conventional 3 vessel aortic
branching pattern. The visualized proximal subclavian arteries are
widely patent.

RIGHT CAROTID SYSTEM: No dissection, occlusion or aneurysm. There is
calcified atherosclerosis extending into the proximal ICA, resulting
in less than 50% stenosis.

LEFT CAROTID SYSTEM: There is eccentric plaque at the left carotid
bifurcation extending into the proximal left internal carotid
artery. There is no hemodynamically significant stenosis.

VERTEBRAL ARTERIES: Left dominant configuration. Both origins are
clearly patent. There is no dissection, occlusion or flow-limiting
stenosis to the skull base (V1-V3 segments).

CTA HEAD FINDINGS

POSTERIOR CIRCULATION:

--Vertebral arteries: Normal V4 segments.

--Inferior cerebellar arteries: Normal.

--Basilar artery: Normal.

--Superior cerebellar arteries: Normal.

--Posterior cerebral arteries (PCA): Normal.

ANTERIOR CIRCULATION:

--Intracranial internal carotid arteries: Atherosclerotic
calcification of the internal carotid arteries at the skull base
without hemodynamically significant stenosis.

--Anterior cerebral arteries (ACA): Normal. Both A1 segments are
present. Patent anterior communicating artery (a-comm).

--Middle cerebral arteries (MCA): Normal.

VENOUS SINUSES: As permitted by contrast timing, patent.

ANATOMIC VARIANTS: None

Review of the MIP images confirms the above findings.
IMPRESSION: 1. No emergent large vessel occlusion or high-grade stenosis of the
intracranial arteries.
2. Eccentric plaque within the proximal left internal carotid artery
may be at increased risk of embolus formation.
3. Bilateral carotid bifurcation atherosclerosis without
hemodynamically significant stenosis by NASCET criteria.
4. Enlarged heterogeneous right thyroid lobe. Recommend thyroid
ultrasound on a nonemergent basis (ref: [HOSPITAL]. 6382

Aortic Atherosclerosis ([id number]).

## 2021-12-07 ENCOUNTER — Ambulatory Visit (INDEPENDENT_AMBULATORY_CARE_PROVIDER_SITE_OTHER): Payer: Medicare Other | Admitting: Family Medicine

## 2021-12-07 ENCOUNTER — Telehealth: Payer: Self-pay | Admitting: Family Medicine

## 2021-12-07 ENCOUNTER — Encounter: Payer: Self-pay | Admitting: Family Medicine

## 2021-12-07 VITALS — Ht 70.0 in

## 2021-12-07 DIAGNOSIS — U071 COVID-19: Secondary | ICD-10-CM | POA: Diagnosis not present

## 2021-12-07 MED ORDER — MOLNUPIRAVIR EUA 200MG CAPSULE: 4.0000 | ORAL_CAPSULE | Freq: Two times a day (BID) | ORAL | 0 refills | Status: AC

## 2021-12-07 NOTE — Telephone Encounter (Signed)
Needs appt

## 2021-12-07 NOTE — Progress Notes (Deleted)
Virtual Visit via Telephone Note I connected with [name] on {(***)}at  5:30 PM EST by telephone and verified that I am speaking with the correct person using two identifiers.   I discussed the limitations, risks, security and privacy concerns of performing an evaluation and management service by telephone and the availability of in person appointments. I also discussed with the patient that there may be a patient responsible charge related to this service. The patient expressed understanding and agreed to proceed.  Location patient: home Location provider: work or home office Participants present for the call: patient, provider Patient did not have a visit in the prior 7 days to address this/these issue(s).  Chief Complaint  Patient presents with   Covid Positive    History of Present Illness: [name] is a 81 y.o.male with hx of ***  Observations/Objective: Patient sounds cheerful and well on the phone. I do not appreciate any SOB. Speech and thought processing are grossly intact. Patient reported vitals:  Assessment and Plan:   Follow Up Instructions:  No follow-ups on file.  [id number] 5-10 [id number] 11-20 [id number] 21-30 I did not refer this patient for an OV in the next 24 hours for this/these issue(s).  I discussed the assessment and treatment plan with the patient. The patient was provided an opportunity to ask questions and all were answered. The patient agreed with the plan and demonstrated an understanding of the instructions.   The patient was advised to call back or seek an in-person evaluation if the symptoms worsen or if the condition fails to improve as anticipated.  I provided *** minutes of non-face-to-face time during this encounter.   Kathreen Devoid, CMA

## 2021-12-07 NOTE — Telephone Encounter (Signed)
Dr. Martinique called the patient.

## 2021-12-07 NOTE — Progress Notes (Signed)
Virtual Visit via Telephone Note I connected with [name] on 12/07/21 at  5:30 PM EST by telephone and verified that I am speaking with the correct person using two identifiers.   I discussed the limitations, risks, security and privacy concerns of performing an evaluation and management service by telephone and the availability of in person appointments. I also discussed with the patient that there may be a patient responsible charge related to this service. The patient expressed understanding and agreed to proceed.  Location patient: home Location provider: work office Participants present for the call: patient, wife,provider Patient did not have a visit in the prior 7 days to address this/these issue(s).  Chief Complaint  Patient presents with   Covid Positive   History of Present Illness: [name] is a 81 y.o.male with hx of TIA,HTN,HFpEF,and paroxysmal atrial fib c/o respiratory symptoms that started yesterday. Cold like symptoms. Fatigue,nasal congestion,rhinorrhea, sore throat,cough,and some body aches.  Negative for fever, headache,anosmia,ageusia,CP,palpitations,dyspnea,wheezing,abdominal pain, N/V,urinary symptoms,or skin rash. He has not tried OTC medication. Today morning he had a positive home COVID 19 test.  Residents of retirement community he lives in have been sick with COVID 19 infection. His wife also had a positive test.  COVID 19 vaccination completed.  Observations/Objective: Patient sounds cheerful and well on the phone. I do not appreciate any SOB,cough,or wheezing. Speech and thought processing are grossly intact. Patient reported vitals:Ht 5\' 10"  (1.778 m)    BMI 23.27 kg/m   Assessment and Plan:  1. COVID-19 virus infection We discussed Dx,possible complications and treatment options. He has a mild to moderate case with risk for complications. We discussed oral antiviral options and side effects. He agrees with trying  Molnupiravir. Symptomatic treatment with plenty of fluids,rest,tylenol 500 mg 3-4 times per day prn. Throat lozenges if needed for sore throat. Complete 7 days of quarantine, starting from symptoms onset. Monitor for new symptoms.  Explained that cough and congestion may last a few more days and even weeks after acute symptoms have resolved. Clearly instructed about warning signs.  - molnupiravir EUA (LAGEVRIO) 200 mg CAPS capsule; Take 4 capsules (800 mg total) by mouth 2 (two) times daily for 5 days.  Dispense: 40 capsule; Refill: 0  Follow Up Instructions:  Return if symptoms worsen or fail to improve.  I did not refer this patient for an OV in the next 24 hours for this/these issue(s).  I discussed the assessment and treatment plan with the patient. The patient was provided an opportunity to ask questions and all were answered. The patient agreed with the plan and demonstrated an understanding of the instructions.   The patient was advised to call back or seek an in-person evaluation if the symptoms worsen or if the condition fails to improve as anticipated.  I provided 9 minutes of non-face-to-face time during this encounter.  Brennley Curtice , MD

## 2021-12-07 NOTE — Telephone Encounter (Signed)
Patient tested positve for Covid today.  Patient's spouse is requesting the antiviral medication.  Please give patient's spouse a call.   Pharmacy- [facility] on [address].

## 2021-12-27 ENCOUNTER — Ambulatory Visit (INDEPENDENT_AMBULATORY_CARE_PROVIDER_SITE_OTHER): Payer: Medicare Other

## 2021-12-27 DIAGNOSIS — I480 Paroxysmal atrial fibrillation: Secondary | ICD-10-CM

## 2021-12-27 DIAGNOSIS — Z950 Presence of cardiac pacemaker: Secondary | ICD-10-CM

## 2021-12-27 LAB — CUP PACEART REMOTE DEVICE CHECK
Battery Remaining Longevity: 52 mo
Battery Remaining Percentage: 42 %
Battery Voltage: 2.96 V
Brady Statistic AP VP Percent: 5.8 %
Brady Statistic AP VS Percent: 92 %
Brady Statistic AS VP Percent: 1 %
Brady Statistic AS VS Percent: 1.5 %
Brady Statistic RA Percent Paced: 96 %
Brady Statistic RV Percent Paced: 5.8 %
Date Time Interrogation Session: 20230214020015
Implantable Lead Implant Date: 20160928
Implantable Lead Implant Date: 20160928
Implantable Lead Location: 753859
Implantable Lead Location: 753860
Implantable Lead Model: 1948
Implantable Pulse Generator Implant Date: 20160928
Lead Channel Impedance Value: 390 Ohm
Lead Channel Impedance Value: 540 Ohm
Lead Channel Pacing Threshold Amplitude: 0.625 V
Lead Channel Pacing Threshold Amplitude: 0.75 V
Lead Channel Pacing Threshold Pulse Width: 0.4 ms
Lead Channel Pacing Threshold Pulse Width: 0.4 ms
Lead Channel Sensing Intrinsic Amplitude: 2 mV
Lead Channel Sensing Intrinsic Amplitude: 2.4 mV
Lead Channel Setting Pacing Amplitude: 1 V
Lead Channel Setting Pacing Amplitude: 1.625
Lead Channel Setting Pacing Pulse Width: 0.4 ms
Lead Channel Setting Sensing Sensitivity: 0.7 mV
Pulse Gen Model: 2240
Pulse Gen Serial Number: 7817736

## 2022-01-02 NOTE — Progress Notes (Signed)
Remote pacemaker transmission.   

## 2022-01-09 ENCOUNTER — Telehealth: Payer: Self-pay | Admitting: Cardiology

## 2022-01-09 DIAGNOSIS — I480 Paroxysmal atrial fibrillation: Secondary | ICD-10-CM

## 2022-01-09 MED ORDER — APIXABAN 5 MG PO TABS: 5.0000 mg | ORAL_TABLET | Freq: Two times a day (BID) | ORAL | 0 refills | Status: DC

## 2022-01-09 NOTE — Telephone Encounter (Signed)
°*  STAT* If patient is at the pharmacy, call can be transferred to refill team.   1. Which medications need to be refilled? (please list name of each medication and dose if known) apixaban (ELIQUIS) 5 MG TABS tablet  2. Which pharmacy/location (including street and city if local pharmacy) is medication to be sent to? [id number] - [place], [place] - [address] AT [address] OF [facility] [place] [address]  3. Do they need a 30 day or 90 day supply? 90 day   Patient is out of medication.

## 2022-01-11 ENCOUNTER — Other Ambulatory Visit: Payer: Self-pay | Admitting: Family Medicine

## 2022-01-13 MED ORDER — APIXABAN 5 MG PO TABS: 5.0000 mg | ORAL_TABLET | Freq: Two times a day (BID) | ORAL | 1 refills | Status: DC

## 2022-01-13 NOTE — Telephone Encounter (Addendum)
Eliquis refill was sent on 01/09/2022 but was placed on NO PRINT, therefore, resending.  ?Eliquis 5mg  refill request received. Patient is 81 years old, weight-73.6kg, Crea-1.03 on 04/14/2021, Diagnosis-Afib, and last seen by Dr. Curt Bears on 05/06/2021. Dose is appropriate based on dosing criteria. Will send in refill to requested pharmacy.   ?Called the pharmacy to confirm it was received and it was.  ?

## 2022-01-13 NOTE — Telephone Encounter (Signed)
Patient following up, as he has not heard back and is completely out of medication. Please assist. ?

## 2022-01-13 NOTE — Addendum Note (Signed)
Addended by: Pamala Hurry B on: 01/13/2022 05:07 PM ? ? Modules accepted: Orders ? ?

## 2022-02-07 ENCOUNTER — Encounter: Payer: Self-pay | Admitting: Cardiology

## 2022-02-09 ENCOUNTER — Telehealth: Payer: Self-pay | Admitting: Cardiology

## 2022-02-09 NOTE — Telephone Encounter (Signed)
Pt c/o BP issue: STAT if pt c/o blurred vision, one-sided weakness or slurred speech ? ?1. What are your last 5 BP readings? 153/75, 146/72, 171/83, 148/78, 143/75 ? ?2. Are you having any other symptoms (ex. Dizziness, headache, blurred vision, passed out)? No  ? ?3. What is your BP issue? Blood pressure is spiking with like to be advised of next steps ? ?

## 2022-02-09 NOTE — Telephone Encounter (Signed)
No changes for now. On average these are OK. Continue checking 1-2 times per week.  ?CL  ? ?Left message to call back ?

## 2022-02-10 NOTE — Telephone Encounter (Signed)
Patient is returning call. He states he would also like to discuss his parameters for salt intake. ?

## 2022-02-10 NOTE — Telephone Encounter (Signed)
Gave advisement from Dr. Lalla Brothers. Advised heart healthy diet salt intake is 2 grams daily. Patient verbalized understanding.  ?

## 2022-03-03 ENCOUNTER — Emergency Department (HOSPITAL_COMMUNITY)
Admission: EM | Admit: 2022-03-03 | Discharge: 2022-03-03 | Disposition: A | Discharge status: Home / Self Care | Payer: Medicare Other | Attending: Emergency Medicine | Admitting: Emergency Medicine

## 2022-03-03 ENCOUNTER — Emergency Department (HOSPITAL_COMMUNITY): Payer: Medicare Other

## 2022-03-03 ENCOUNTER — Other Ambulatory Visit: Payer: Self-pay

## 2022-03-03 DIAGNOSIS — Z23 Encounter for immunization: Secondary | ICD-10-CM | POA: Insufficient documentation

## 2022-03-03 DIAGNOSIS — Z79899 Other long term (current) drug therapy: Secondary | ICD-10-CM | POA: Insufficient documentation

## 2022-03-03 DIAGNOSIS — W010XXA Fall on same level from slipping, tripping and stumbling without subsequent striking against object, initial encounter: Secondary | ICD-10-CM | POA: Diagnosis not present

## 2022-03-03 DIAGNOSIS — I10 Essential (primary) hypertension: Secondary | ICD-10-CM | POA: Insufficient documentation

## 2022-03-03 DIAGNOSIS — Z7901 Long term (current) use of anticoagulants: Secondary | ICD-10-CM | POA: Insufficient documentation

## 2022-03-03 DIAGNOSIS — S0083XD Contusion of other part of head, subsequent encounter: Secondary | ICD-10-CM

## 2022-03-03 DIAGNOSIS — Y900 Blood alcohol level of less than 20 mg/100 ml: Secondary | ICD-10-CM | POA: Diagnosis not present

## 2022-03-03 DIAGNOSIS — S0990XA Unspecified injury of head, initial encounter: Secondary | ICD-10-CM | POA: Diagnosis present

## 2022-03-03 DIAGNOSIS — S01112A Laceration without foreign body of left eyelid and periocular area, initial encounter: Secondary | ICD-10-CM | POA: Diagnosis not present

## 2022-03-03 DIAGNOSIS — W19XXXA Unspecified fall, initial encounter: Secondary | ICD-10-CM

## 2022-03-03 DIAGNOSIS — R4789 Other speech disturbances: Secondary | ICD-10-CM | POA: Diagnosis not present

## 2022-03-03 DIAGNOSIS — S0181XD Laceration without foreign body of other part of head, subsequent encounter: Secondary | ICD-10-CM

## 2022-03-03 LAB — CBC
HCT: 36.7 % — ABNORMAL LOW (ref 39.0–52.0)
Hemoglobin: 12 g/dL — ABNORMAL LOW (ref 13.0–17.0)
MCH: 33.4 pg (ref 26.0–34.0)
MCHC: 32.7 g/dL (ref 30.0–36.0)
MCV: 102.2 fL — ABNORMAL HIGH (ref 80.0–100.0)
Platelets: 148 10*3/uL — ABNORMAL LOW (ref 150–400)
RBC: 3.59 MIL/uL — ABNORMAL LOW (ref 4.22–5.81)
RDW: 13.2 % (ref 11.5–15.5)
WBC: 6.4 10*3/uL (ref 4.0–10.5)
nRBC: 0 % (ref 0.0–0.2)

## 2022-03-03 LAB — URINALYSIS, ROUTINE W REFLEX MICROSCOPIC
Bilirubin Urine: NEGATIVE
Glucose, UA: NEGATIVE mg/dL
Hgb urine dipstick: NEGATIVE
Ketones, ur: NEGATIVE mg/dL
Leukocytes,Ua: NEGATIVE
Nitrite: NEGATIVE
Protein, ur: NEGATIVE mg/dL
Specific Gravity, Urine: 1.012 (ref 1.005–1.030)
pH: 5 (ref 5.0–8.0)

## 2022-03-03 LAB — I-STAT CHEM 8, ED
BUN: 23 mg/dL (ref 8–23)
Calcium, Ion: 0.97 mmol/L — ABNORMAL LOW (ref 1.15–1.40)
Chloride: 102 mmol/L (ref 98–111)
Creatinine, Ser: 1.2 mg/dL (ref 0.61–1.24)
Glucose, Bld: 92 mg/dL (ref 70–99)
HCT: 33 % — ABNORMAL LOW (ref 39.0–52.0)
Hemoglobin: 11.2 g/dL — ABNORMAL LOW (ref 13.0–17.0)
Potassium: 4.3 mmol/L (ref 3.5–5.1)
Sodium: 134 mmol/L — ABNORMAL LOW (ref 135–145)
TCO2: 23 mmol/L (ref 22–32)

## 2022-03-03 LAB — COMPREHENSIVE METABOLIC PANEL
ALT: 34 U/L (ref 0–44)
AST: 30 U/L (ref 15–41)
Albumin: 3.7 g/dL (ref 3.5–5.0)
Alkaline Phosphatase: 102 U/L (ref 38–126)
Anion gap: 11 (ref 5–15)
BUN: 22 mg/dL (ref 8–23)
CO2: 20 mmol/L — ABNORMAL LOW (ref 22–32)
Calcium: 8.7 mg/dL — ABNORMAL LOW (ref 8.9–10.3)
Chloride: 103 mmol/L (ref 98–111)
Creatinine, Ser: 1.19 mg/dL (ref 0.61–1.24)
GFR, Estimated: >60 mL/min (ref >60)
Glucose, Bld: 97 mg/dL (ref 70–99)
Potassium: 4.5 mmol/L (ref 3.5–5.1)
Sodium: 134 mmol/L — ABNORMAL LOW (ref 135–145)
Total Bilirubin: 1.3 mg/dL — ABNORMAL HIGH (ref 0.3–1.2)
Total Protein: 6.2 g/dL — ABNORMAL LOW (ref 6.5–8.1)

## 2022-03-03 LAB — PROTIME-INR
INR: 1.1 (ref 0.8–1.2)
Prothrombin Time: 14.1 seconds (ref 11.4–15.2)

## 2022-03-03 LAB — SAMPLE TO BLOOD BANK

## 2022-03-03 LAB — ETHANOL: Alcohol, Ethyl (B): <10 mg/dL (ref <10)

## 2022-03-03 MED ORDER — TETANUS-DIPHTH-ACELL PERTUSSIS 5-2.5-18.5 LF-MCG/0.5 IM SUSY
0.5000 mL | PREFILLED_SYRINGE | Freq: Once | INTRAMUSCULAR | Status: AC
  Administered 2022-03-03: 0.5 mL via INTRAMUSCULAR
  Filled 2022-03-03: qty 0.5

## 2022-03-03 MED ORDER — LIDOCAINE-EPINEPHRINE-TETRACAINE (LET) TOPICAL GEL
3.0000 mL | Freq: Once | TOPICAL | Status: AC
  Administered 2022-03-03: 3 mL via TOPICAL
  Filled 2022-03-03: qty 3

## 2022-03-03 MED ORDER — FENTANYL CITRATE PF 50 MCG/ML IJ SOSY
50.0000 ug | PREFILLED_SYRINGE | Freq: Once | INTRAMUSCULAR | Status: AC
  Administered 2022-03-03: 50 ug via INTRAVENOUS
  Filled 2022-03-03: qty 1

## 2022-03-03 MED ORDER — LIDOCAINE HCL (PF) 1 % IJ SOLN
5.0000 mL | Freq: Once | INTRAMUSCULAR | Status: AC
  Administered 2022-03-03: 5 mL via INTRADERMAL
  Filled 2022-03-03: qty 5

## 2022-03-03 NOTE — ED Provider Notes (Signed)
?[facility] ?Provider Note ? ? ?CSN: [id number] ?Arrival date & time: 03/03/22  1921 ? ?  ? ?History ? ?Chief Complaint  ?Patient presents with  ? Fall  ? ? ?[name] is a 81 y.o. male. ? ? ?Fall ? ?Patient is 81 year old male with a history of essential hypertension, AKI, HFpEF, TIA (currently on Eliquis 5mg ) who presents to the emergency department by EMS after mechanical fall.  Patient tripped and fell onto carpet landing on his left side of the body.  EMS report patient complained about left eyebrow bleeding and right elbow pain.  No intervention prior to arrival. ? ?Patient report he tripped and fell and landed on his left side of the body.  He currently reports left eyebrow pain.  Denies chest pain or shortness of breath.  Denies abdominal pain.  Denies hip pain or lower extremity pain.  He report he feels fine.  No other complaints. ? ? ?Home Medications ?Prior to Admission medications   ?Medication Sig Start Date End Date Taking? Authorizing Provider  ?albuterol (VENTOLIN HFA) 108 (90 Base) MCG/ACT inhaler Inhale 2 puffs into the lungs 4 (four) times daily as needed for wheezing or shortness of breath. 09/21/21   [place], Betty G, MD  ?apixaban (ELIQUIS) 5 MG TABS tablet Take 1 tablet (5 mg total) by mouth 2 (two) times daily. 01/13/22   Camnitz, Andree CossWill Martin, MD  ?atorvastatin (LIPITOR) 40 MG tablet Take 1 tablet (40 mg total) by mouth daily. 09/18/20   Burnadette PopAdhikari, Amrit, MD  ?cholecalciferol (VITAMIN D) 25 MCG (1000 UNIT) tablet Take 1,000 Units by mouth at bedtime.    [provider]  ?Ferrous Sulfate (IRON) 325 (65 Fe) MG TABS Take 1 tablet by mouth at bedtime.    [provider]  ?Fluticasone Furoate (ARNUITY ELLIPTA) 200 MCG/ACT AEPB Inhale 1 puff into the lungs every morning. 09/02/21   [place]ilandJordan, Betty G, MD  ?Magnesium 250 MG TABS Take 250 mg by mouth every morning.    [provider]  ?Multiple Vitamin (MULTIVITAMIN WITH MINERALS) TABS tablet  Take 1 tablet by mouth every morning.    [provider]  ?Omega-3 Fatty Acids (FISH OIL PO) Take 1 capsule by mouth every morning.    [provider]  ?omeprazole (PRILOSEC) 40 MG capsule Take 1 capsule (40 mg total) by mouth daily before supper. 09/16/21   [place]ilandJordan, Betty G, MD  ?   ? ?Allergies    ?Patient has no known allergies.   ? ? ?Physical Exam ?Updated Vital Signs ?BP (!) 152/72 (BP Location: Right Arm)   Pulse 60   Temp 97.8 ?F (36.6 ?C) (Oral)   Resp 12   SpO2 99%  ?Physical Exam ?Constitutional:   ?   General: He is not in acute distress. ?   Appearance: He is not ill-appearing.  ?HENT:  ?   Head: Normocephalic.  ?   Comments: Left upper eyelid lateral laceration that measures about a centimeter.  Area is minimally bleeding. ?   Nose: Nose normal. No congestion.  ?   Mouth/Throat:  ?   Mouth: Mucous membranes are moist.  ?   Pharynx: Oropharynx is clear.  ?Eyes:  ?   Extraocular Movements: Extraocular movements intact.  ?   Pupils: Pupils are equal, round, and reactive to light.  ?   Comments: No sign of entrapment.  Extraocular movements are intact.  No bony deformity.  ?Cardiovascular:  ?   Rate and Rhythm: Normal rate.  ?  Comments: Chest stable to AP and lateral compression ?Pulmonary:  ?   Effort: No respiratory distress.  ?   Breath sounds: No wheezing or rhonchi.  ?Chest:  ?   Chest wall: No tenderness.  ?Abdominal:  ?   General: There is no distension.  ?   Tenderness: There is no guarding or rebound.  ?   Hernia: A hernia is present.  ?Musculoskeletal:     ?   General: No tenderness or deformity. Normal range of motion.  ?   Cervical back: Normal range of motion. No rigidity or tenderness.  ?Skin: ?   Findings: Bruising present.  ?   Comments: Diffuse bruising in upper and lower extremity.  Most of the bruising are chronic.  ?Neurological:  ?   General: No focal deficit present.  ?   Mental Status: He is alert and oriented to person, place, and time. Mental status is at  baseline.  ?   Sensory: No sensory deficit.  ?   Motor: No weakness.  ?Psychiatric:     ?   Mood and Affect: Mood normal.  ? ? ?ED Results / Procedures / Treatments   ?Labs ?(all labs ordered are listed, but only abnormal results are displayed) ?Labs Reviewed  ?COMPREHENSIVE METABOLIC PANEL - Abnormal; Notable for the following components:  ?    Result Value  ? Sodium 134 (*)   ? CO2 20 (*)   ? Calcium 8.7 (*)   ? Total Protein 6.2 (*)   ? Total Bilirubin 1.3 (*)   ? All other components within normal limits  ?CBC - Abnormal; Notable for the following components:  ? RBC 3.59 (*)   ? Hemoglobin 12.0 (*)   ? HCT 36.7 (*)   ? MCV 102.2 (*)   ? Platelets 148 (*)   ? All other components within normal limits  ?I-STAT CHEM 8, ED - Abnormal; Notable for the following components:  ? Sodium 134 (*)   ? Calcium, Ion 0.97 (*)   ? Hemoglobin 11.2 (*)   ? HCT 33.0 (*)   ? All other components within normal limits  ?ETHANOL  ?URINALYSIS, ROUTINE W REFLEX MICROSCOPIC  ?PROTIME-INR  ?SAMPLE TO BLOOD BANK  ? ? ?EKG ?None ? ?Radiology ?DG Elbow 2 Views Left ? ?Result Date: 03/03/2022 ?CLINICAL DATA:  Fall EXAM: LEFT ELBOW - 2 VIEW COMPARISON:  12/25/2020 FINDINGS: Degenerative changes in the left elbow with joint space narrowing and spurring. Intra-articular loose body noted anteriorly. No acute bony abnormality. Specifically, no fracture, subluxation, or dislocation. No joint effusion. IMPRESSION: Degenerative changes.  No acute bony abnormality. Electronically Signed   By: Charlett Nose M.D.   On: 03/03/2022 20:29  ? ?DG Forearm Left ? ?Result Date: 03/03/2022 ?CLINICAL DATA:  Fall EXAM: LEFT FOREARM - 2 VIEW COMPARISON:  None. FINDINGS: No acute bony abnormality. Specifically, no fracture, subluxation, or dislocation. Soft tissues are intact. Advanced degenerative changes at the 1st carpometacarpal joint. IMPRESSION: No acute bony abnormality. Electronically Signed   By: Charlett Nose M.D.   On: 03/03/2022 20:29  ? ?CT HEAD WO  CONTRAST ? ?Result Date: 03/03/2022 ?CLINICAL DATA:  Status post fall. EXAM: CT HEAD WITHOUT CONTRAST TECHNIQUE: Contiguous axial images were obtained from the base of the skull through the vertex without intravenous contrast. RADIATION DOSE REDUCTION: This exam was performed according to the departmental dose-optimization program which includes automated exposure control, adjustment of the mA and/or kV according to patient size and/or use of iterative reconstruction technique. COMPARISON:  None. FINDINGS: Brain: There is mild cerebral atrophy with widening of the extra-axial spaces and ventricular dilatation. There are areas of decreased attenuation within the white matter tracts of the supratentorial brain, consistent with microvascular disease changes. Vascular: No hyperdense vessel or unexpected calcification. Skull: Normal. Negative for fracture or focal lesion. Sinuses/Orbits: No acute finding. Other: There is moderate severity left periorbital, left preseptal and left frontal scalp soft tissue swelling. IMPRESSION: 1. Moderate severity left periorbital, left preseptal and left frontal scalp soft tissue swelling. Further evaluation with an ophthalmology consult is recommended. 2. No acute intracranial abnormality. 3. Generalized cerebral atrophy with chronic white matter small vessel ischemic changes. Electronically Signed   By: Aram Candela M.D.   On: 03/03/2022 19:58  ? ?CT CERVICAL SPINE WO CONTRAST ? ?Result Date: 03/03/2022 ?CLINICAL DATA:  Status post fall. EXAM: CT CERVICAL SPINE WITHOUT CONTRAST TECHNIQUE: Multidetector CT imaging of the cervical spine was performed without intravenous contrast. Multiplanar CT image reconstructions were also generated. RADIATION DOSE REDUCTION: This exam was performed according to the departmental dose-optimization program which includes automated exposure control, adjustment of the mA and/or kV according to patient size and/or use of iterative reconstruction  technique. COMPARISON:  None. FINDINGS: Alignment: Dextroscoliosis of the cervical spine is noted which may be positional in nature. There is approximately 3 mm to 4 mm anterolisthesis of the C4 vertebral body on C5. App

## 2022-03-03 NOTE — Progress Notes (Signed)
?  03/03/22 1910  ?Clinical Encounter Type  ?Visited With Patient;Health care provider  ?Visit Type ED;Trauma;Initial ?(Level 2)  ?Referral From Nurse  ?Consult/Referral To Chaplain ?[name]  ? ?Responded to page in E.D. Room 16 for Level 2 Trauma. Met [name] at bedside offered hospitality, but patient was comfortable at this time. [name] was being evaluated and treated by medical staff at this time.  [name] stated that his wife [name] was in-route. Staff will page Chaplain upon request of patient or family.  ?Chaplain Tikesha Mort, M.Min., [phone].   ?

## 2022-03-03 NOTE — ED Notes (Signed)
Trauma Response Nurse Documentation ? ? ?[name] is a 81 y.o. male arriving to [facility] ED via EMS ? ?On Eliquis (apixaban) daily. Trauma was activated as a Level 2 by ED charge RN based on the following trauma criteria Elderly patients > 65 with head trauma on anti-coagulation (excluding ASA). Trauma team at the bedside on patient arrival. Patient cleared for CT by Dr. Tyrone Nine EDP. Patient to CT with team. GCS 15. ? ?History  ? Past Medical History:  ?Diagnosis Date  ? Carotid artery occlusion   ? GERD (gastroesophageal reflux disease)   ? Hypertension   ? Stroke [facility]) 02/2021  ? TIA  ?  ? Past Surgical History:  ?Procedure Laterality Date  ? CATARACT EXTRACTION, BILATERAL    ? ENDARTERECTOMY Left 09/16/2020  ? Procedure: LEFT ENDARTERECTOMY CAROTID;  Surgeon: Waynetta Sandy, MD;  Location: [facility];  Service: Vascular;  Laterality: Left;  ? HERNIA REPAIR    ? PACEMAKER IMPLANT    ? TONSILLECTOMY    ?  ? ? ? ?Initial Focused Assessment (If applicable, or please see trauma documentation): ?Alert/oriented x4 male arrives via EMS from assisted living facility ?Airway intact, breathing easy and labored, BS clear ?Bleeding to approx 1 inch left brow laceration, dressing reinforced, combat guaze added ?Hematoma to left brow ?PERRLA 3MM  ?Skin tear right elbow, bleeding controlled, new guaze dressing applied ?Pain to left elbow and forearm, worse with movement ?Left chest wall pain, worse with movement, palpation ?Bruising to left posterior knee, right midthigh, left knee - patient reports these bruises are old ? ?CT's Completed:   ?CT Head and CT C-Spine  ? ?Interventions:  ?CTs as above ?Portable XRAY chest ?XRAYS left elbow and forearm ?Wound care, laceration repair ?IV start and trauma lab draw ?TDAP up to date in the last five years, update not indicated ?Family presence ? ?Plan for disposition:  ?Discharge home anticipated pending imaging and laceration repair ? ?Consults completed:  ?none at the time of  this note. ? ?Event Summary: ?Patient arrives via EMS from his assisted living facility after a mechanical fall while leaving the dining hall. States he tripped over his feet. Reports pain to left brow, left chest and left elbow/forearm. Chronic pain to right low pain, hx scoliosis. Approx 1 inch laceration to left brow with associated hematoma, bleeding uncontrolled, pressure dressing and combat guaze applied.  ? ?IV established in R AC. Reports TDAP up to date.  ? ?MTP Summary (If applicable): NA ? ?Bedside handoff with ED RN Mykenzie.   ? ?[place]  ?Trauma Response RN ? ?Please call TRN at [phone] for further assistance. ?  ?

## 2022-03-03 NOTE — ED Notes (Signed)
RN reviewed discharge instructions w/ pt. Follow up and pain management reviewed, pt had no further questions. 

## 2022-03-03 NOTE — ED Notes (Signed)
RN dressed pt's wound w/ bacitracin, nonadherent pad and gauze. Pt and his wife educated on wound care ?

## 2022-03-03 NOTE — Progress Notes (Signed)
?  03/03/22 2040  ?Clinical Encounter Type  ?Visited With Patient and family together  ?Visit Type Follow-up  ?Referral From Chaplain  ?Consult/Referral To Chaplain ?[name])  ?Spiritual Encounters  ?Spiritual Needs Emotional  ? ?Met [name] and his wife at patient's bedside. Offered emotional support and hospitality. ?[address] [place], M. Min., [phone]. ?

## 2022-03-03 NOTE — ED Notes (Addendum)
Patient transported to CT w/ [name] TRN ?

## 2022-03-03 NOTE — Discharge Instructions (Signed)
Your work-up in the emergency department did not show anything acute.  ? ?Your laceration has been repaired.  You need to follow-up with your primary care provider and suture removal in 5 to 7 days.  Continue to apply bacitracin cream.  ? ?If you have worsening symptoms, dizziness, problems with walking or if you need to be reevaluated please come back to the emergency department. ?

## 2022-03-03 NOTE — ED Triage Notes (Signed)
Pt arrives via [facility] as level 2 fall on thinners. Pt from [facility] independent living, pt tripped and fell hitting L eye and R elbow, landed on carpet, no LOC. Pt c/o chronic R side back pain. Pt has lac to L eyebrow. ?

## 2022-03-04 ENCOUNTER — Emergency Department (HOSPITAL_COMMUNITY): Payer: Medicare Other

## 2022-03-04 ENCOUNTER — Emergency Department (HOSPITAL_COMMUNITY)
Admission: EM | Admit: 2022-03-04 | Discharge: 2022-03-04 | Disposition: A | Discharge status: Home / Self Care | Payer: Medicare Other | Attending: Emergency Medicine | Admitting: Emergency Medicine

## 2022-03-04 ENCOUNTER — Encounter (HOSPITAL_COMMUNITY): Payer: Self-pay

## 2022-03-04 DIAGNOSIS — R4789 Other speech disturbances: Secondary | ICD-10-CM | POA: Diagnosis not present

## 2022-03-04 DIAGNOSIS — Z87891 Personal history of nicotine dependence: Secondary | ICD-10-CM | POA: Diagnosis not present

## 2022-03-04 DIAGNOSIS — R410 Disorientation, unspecified: Secondary | ICD-10-CM | POA: Diagnosis not present

## 2022-03-04 DIAGNOSIS — R7309 Other abnormal glucose: Secondary | ICD-10-CM | POA: Insufficient documentation

## 2022-03-04 DIAGNOSIS — R4182 Altered mental status, unspecified: Secondary | ICD-10-CM | POA: Diagnosis present

## 2022-03-04 DIAGNOSIS — I10 Essential (primary) hypertension: Secondary | ICD-10-CM | POA: Diagnosis not present

## 2022-03-04 DIAGNOSIS — Z7901 Long term (current) use of anticoagulants: Secondary | ICD-10-CM | POA: Insufficient documentation

## 2022-03-04 LAB — CBC
HCT: 35 % — ABNORMAL LOW (ref 39.0–52.0)
Hemoglobin: 12.1 g/dL — ABNORMAL LOW (ref 13.0–17.0)
MCH: 33.9 pg (ref 26.0–34.0)
MCHC: 34.6 g/dL (ref 30.0–36.0)
MCV: 98 fL (ref 80.0–100.0)
Platelets: 161 10*3/uL (ref 150–400)
RBC: 3.57 MIL/uL — ABNORMAL LOW (ref 4.22–5.81)
RDW: 13 % (ref 11.5–15.5)
WBC: 6.7 10*3/uL (ref 4.0–10.5)
nRBC: 0 % (ref 0.0–0.2)

## 2022-03-04 LAB — I-STAT CHEM 8, ED
BUN: 20 mg/dL (ref 8–23)
Calcium, Ion: 1.12 mmol/L — ABNORMAL LOW (ref 1.15–1.40)
Chloride: 101 mmol/L (ref 98–111)
Creatinine, Ser: 1.3 mg/dL — ABNORMAL HIGH (ref 0.61–1.24)
Glucose, Bld: 133 mg/dL — ABNORMAL HIGH (ref 70–99)
HCT: 34 % — ABNORMAL LOW (ref 39.0–52.0)
Hemoglobin: 11.6 g/dL — ABNORMAL LOW (ref 13.0–17.0)
Potassium: 4 mmol/L (ref 3.5–5.1)
Sodium: 137 mmol/L (ref 135–145)
TCO2: 23 mmol/L (ref 22–32)

## 2022-03-04 LAB — DIFFERENTIAL
Abs Immature Granulocytes: 0.07 10*3/uL (ref 0.00–0.07)
Basophils Absolute: 0 10*3/uL (ref 0.0–0.1)
Basophils Relative: 0 %
Eosinophils Absolute: 0 10*3/uL (ref 0.0–0.5)
Eosinophils Relative: 0 %
Immature Granulocytes: 1 %
Lymphocytes Relative: 26 %
Lymphs Abs: 1.8 10*3/uL (ref 0.7–4.0)
Monocytes Absolute: 0.5 10*3/uL (ref 0.1–1.0)
Monocytes Relative: 8 %
Neutro Abs: 4.3 10*3/uL (ref 1.7–7.7)
Neutrophils Relative %: 65 %

## 2022-03-04 LAB — COMPREHENSIVE METABOLIC PANEL
ALT: 32 U/L (ref 0–44)
AST: 27 U/L (ref 15–41)
Albumin: 3.7 g/dL (ref 3.5–5.0)
Alkaline Phosphatase: 95 U/L (ref 38–126)
Anion gap: 11 (ref 5–15)
BUN: 19 mg/dL (ref 8–23)
CO2: 21 mmol/L — ABNORMAL LOW (ref 22–32)
Calcium: 8.8 mg/dL — ABNORMAL LOW (ref 8.9–10.3)
Chloride: 102 mmol/L (ref 98–111)
Creatinine, Ser: 1.37 mg/dL — ABNORMAL HIGH (ref 0.61–1.24)
GFR, Estimated: 52 mL/min — ABNORMAL LOW (ref >60)
Glucose, Bld: 136 mg/dL — ABNORMAL HIGH (ref 70–99)
Potassium: 3.9 mmol/L (ref 3.5–5.1)
Sodium: 134 mmol/L — ABNORMAL LOW (ref 135–145)
Total Bilirubin: 1.5 mg/dL — ABNORMAL HIGH (ref 0.3–1.2)
Total Protein: 6.2 g/dL — ABNORMAL LOW (ref 6.5–8.1)

## 2022-03-04 LAB — APTT: aPTT: 33 seconds (ref 24–36)

## 2022-03-04 LAB — TROPONIN I (HIGH SENSITIVITY)
Troponin I (High Sensitivity): 12 ng/L (ref <18)
Troponin I (High Sensitivity): 11 ng/L (ref <18)

## 2022-03-04 LAB — CBG MONITORING, ED: Glucose-Capillary: 133 mg/dL — ABNORMAL HIGH (ref 70–99)

## 2022-03-04 LAB — PROTIME-INR
INR: 1.1 (ref 0.8–1.2)
Prothrombin Time: 14.4 seconds (ref 11.4–15.2)

## 2022-03-04 MED ORDER — IOHEXOL 350 MG/ML SOLN
100.0000 mL | Freq: Once | INTRAVENOUS | Status: AC | PRN
  Administered 2022-03-04: 100 mL via INTRAVENOUS

## 2022-03-04 MED ORDER — SODIUM CHLORIDE 0.9 % IV BOLUS
1000.0000 mL | Freq: Once | INTRAVENOUS | Status: AC
  Administered 2022-03-04: 1000 mL via INTRAVENOUS

## 2022-03-04 MED ORDER — SODIUM CHLORIDE 0.9% FLUSH: 3.0000 mL | Freq: Once | INTRAVENOUS | Status: DC

## 2022-03-04 MED ORDER — ACETAMINOPHEN 325 MG PO TABS: 650.0000 mg | ORAL_TABLET | Freq: Four times a day (QID) | ORAL | 0 refills | Status: AC | PRN

## 2022-03-04 MED ORDER — LIDOCAINE 5 % EX PTCH: 1.0000 | MEDICATED_PATCH | Freq: Every day | CUTANEOUS | 0 refills | Status: DC | PRN

## 2022-03-04 NOTE — ED Triage Notes (Signed)
Pt bib EMS from [facility] for stroke sx. Wife reports increased confusion and asphasia at this AM. LKW was 0900.  ?

## 2022-03-04 NOTE — Discharge Instructions (Signed)
It was a pleasure caring for you today in the emergency department. ?Please follow-up with neurology ?Continue your current home medications ? ?Please return to the emergency department for any worsening or worrisome symptoms. ? ?

## 2022-03-04 NOTE — Consult Note (Signed)
Neurology Consultation ? ?Reason for Consult: Code Stroke  ?Referring Physician: Dr. Pearline Cables  ? ?CC: aphasia ? ?History is obtained from:patient, wife and medical record  ? ?HPI: [name] is a 81 y.o. 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 EMS reports improvement in exam while en route to hospital. Patient had a fall yesterday at home, last Eliquis dose yesterday am. Patient has had 4 or 5 episodes for similar symptoms in the past. He follows with Dr. [name]. Code stroke called by EMS ? ?LKW: 0900 ?tpa given?: no, no acute stroke ?Premorbid modified Rankin scale (mRS):  ?1-No significant post stroke disability and can perform usual duties with stroke symptoms ? ? ?ROS: Full ROS was performed and is negative except as noted in the HPI.   ? ?Past Medical History:  ?Diagnosis Date  ? Carotid artery occlusion   ? GERD (gastroesophageal reflux disease)   ? Hypertension   ? Stroke [facility]) 02/2021  ? TIA  ? ? ?Hypertension Emergency (SBP > 180 or DBP > 120 & end organ damage) and Paroxysmal atrial fibrillation  ? ?Family History  ?Family history unknown: Yes  ? ? ? ?Social History:  ? reports that he has quit smoking. He has never used smokeless tobacco. He reports current alcohol use. He reports that he does not use drugs. ? ?Medications ? ?Current Facility-Administered Medications:  ?  sodium chloride flush (NS) 0.9 % injection 3 mL, 3 mL, Intravenous, Once, Wynona Dove A, DO ? ?Current Outpatient Medications:  ?  albuterol (VENTOLIN HFA) 108 (90 Base) MCG/ACT inhaler, Inhale 2 puffs into the lungs 4 (four) times daily as needed for wheezing or shortness of breath., Disp: 8 g, Rfl: 1 ?  apixaban (ELIQUIS) 5 MG TABS tablet, Take 1 tablet (5 mg total) by mouth 2 (two) times daily., Disp: 180 tablet, Rfl: 1 ?  atorvastatin (LIPITOR) 40 MG tablet, Take 1 tablet (40 mg total) by mouth  daily., Disp: 30 tablet, Rfl: 1 ?  cholecalciferol (VITAMIN D) 25 MCG (1000 UNIT) tablet, Take 1,000 Units by mouth at bedtime., Disp: , Rfl:  ?  Ferrous Sulfate (IRON) 325 (65 Fe) MG TABS, Take 1 tablet by mouth at bedtime., Disp: , Rfl:  ?  Fluticasone Furoate (ARNUITY ELLIPTA) 200 MCG/ACT AEPB, Inhale 1 puff into the lungs every morning., Disp: 90 each, Rfl: 3 ?  Magnesium 250 MG TABS, Take 250 mg by mouth every morning., Disp: , Rfl:  ?  Multiple Vitamin (MULTIVITAMIN WITH MINERALS) TABS tablet, Take 1 tablet by mouth every morning., Disp: , Rfl:  ?  Omega-3 Fatty Acids (FISH OIL PO), Take 1 capsule by mouth every morning., Disp: , Rfl:  ?  omeprazole (PRILOSEC) 40 MG capsule, Take 1 capsule (40 mg total) by mouth daily before supper., Disp: 90 capsule, Rfl: 2 ? ? ?Exam: ?Current vital signs: ?Wt 70.6 kg   BMI 22.33 kg/m?  ?Vital signs in last 24 hours: ?Temp:  [96.9 ?F (36.1 ?C)-97.8 ?F (36.6 ?C)] 97.8 ?F (36.6 ?C) (04/21 2236) ?Pulse Rate:  [59-65] 60 (04/21 2236) ?Resp:  [10-23] 12 (04/21 2236) ?BP: (128-181)/(69-84) 152/72 (04/21 2236) ?SpO2:  [95 %-100 %] 99 % (04/21 2236) ?Weight:  [70.6 kg] 70.6 kg (04/22 1050) ? ?GENERAL: Awake, alert in NAD ?HEENT: - bruising and swelling around left eye with bandage, Normocephalic, dry mm ?LUNGS -  Clear to auscultation bilaterally with no wheezes ?CV - S1S2 RRR, no m/r/g, equal pulses bilaterally. ?ABDOMEN - Soft, nontender, nondistended with normoactive BS ?Ext: warm, well perfused, intact peripheral pulses, no edema ? ?NEURO:  ?Mental Status: AA&Ox4 ?Language: speech is clear.  Naming, repetition, fluency, and comprehension intact but does pause with some word finding difficulty and longer sentences.Marland Kitchen ?Cranial Nerves: PERRL 39mm/brisk. EOMI, visual fields full, no facial asymmetry, facial sensation intact, hearing intact, tongue/uvula/soft palate midline, normal sternocleidomastoid and trapezius muscle strength. No evidence of tongue atrophy or fibrillations ?Motor:  4/5 in all 4 extremities ?Tone: is normal and bulk is normal ?Sensation- Intact to light touch bilaterally ?Coordination: FTN intact bilaterally, no ataxia in BLE. ?Gait- deferred ? ?NIHSS ?1a Level of Conscious.: 0 ?1b LOC Questions: 0 ?1c LOC Commands: 0 ?2 Best Gaze: 0 ?3 Visual: 0 ?4 Facial Palsy: 0 ?5a Motor Arm - left: 0 ?5b Motor Arm - Right: 0 ?6a Motor Leg - Left: 0 ?6b Motor Leg - Right: 0 ?7 Limb Ataxia: 0 ?8 Sensory: 0 ?9 Best Language: 0 ?10 Dysarthria: 0 ?11 Extinct. and Inatten.: 0 ?TOTAL: 0  ? ? ?Imaging ?I have reviewed the images obtained: ? ?Code stroke CT-head 4/22: ?No evidence of acute intracranial abnormality.  ASPECTS is 10. ?Moderate chronic small ischemic changes within the cerebral white matter, stable from the prior head CT of 03/03/2022. ?Mild generalized parenchymal atrophy. ?Redemonstrated large left periorbital/facial hematoma ? ?Assessment:  ?[name] is a 81 y.o. 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 ED via EMS for acute onset of aphasia. EMS reports improvement in exam while en route to hospital. Patient had a fall yesterday at home, last Eliquis dose yesterday am. Patient has had 4 or 5 episodes for similar symptoms in the past. He follows with Dr. [name]. Code stroke called by EMS ? ?Impression ?Speech arrest versus aphasia --question underlying neurodegenerative process. ?Less likely acute stroke ?Less likely recurrent TIA but that remains within the realm of possibility. ? ?Recommendations: ?-Continue lipitor and Eliquis  ?-observe patient for now and if remains free of neurological deficits can be discharged home ?- follow up with Dr. [name] as outpatient ? ?Beulah Gandy DNP, ACNPC-AG ? ? ?Attending addendum ?Patient seen and examined as acute code stroke for expressive aphasia ?NIH at the time of our evaluation was barely 1. ?He has had stroke work-up-is on Eliquis, and  atorvastatin. ?Yesterday had a fall and left forehead and eye laceration that was repaired in the ED. ?CT head reviewed personally-no bleed. ?CT angiography head and neck with no emergent LVO.  Redemonstrated sequela of prior left carotid endarterectomy.  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  Unchanged mild to moderate narrowing at the origin of the dominant left vert.  Redemonstrated enlarged heterogeneous thyroid gland. ?CT head with no emergent LVO as well. ?At this time, MRI cannot be done because he has a noncompatible pacer. ?Also had EEGs in the past with no evidence of seizures. ?Not clear if this is a TIA versus speech arrest related with some amount of cognitive decline. ?If he remains symptom-free, can be discharged home on home meds that he is already taking-Eliquis, and Lipitor.  Follow-up with outpatient neurology ?Plan relayed to Dr. Pearline Cables. ? ?-- ?Amie Portland, MD ?Neurologist ?[facility] ?Pager: [phone] ? ?

## 2022-03-04 NOTE — ED Provider Notes (Signed)
?[place] ?Provider Note ? ? ?CSN: [id number] ?Arrival date & time: 03/04/22  1044 ? ?An emergency department physician performed an initial assessment on this suspected stroke patient at 65. ? ?History ? ?Chief Complaint  ?Patient presents with  ? Code Stroke  ? ? ?[name] is a 81 y.o. male. ? ? Patient as above with significant medical history as below, including hypertension, TIA, Eliquis who presents to the ED with complaint of AMS.  Of note patient was seen emerged part yesterday following a fall.  Spouse reports patient had episode of confusion, difficulty speaking, difficulty holding his body upright this morning beginning around 9 AM.  Last approximately 30 to 45 minutes.  Upon arrival to the ED spouse reports patient is at his approximate baseline currently.  Speaking normally.   ? ?Spouse denies any further trauma since yesterday. ? ?Last dose of Eliquis was yesterday morning ? ?Stroke alert activated prior to arrival, airway cleared and patient sent to CT for imaging. ? ? ? ?Past Medical History: ?No date: Carotid artery occlusion ?No date: GERD (gastroesophageal reflux disease) ?No date: Hypertension ?02/2021: Stroke [facility]) ?    Comment:  TIA ? ?Past Surgical History: ?No date: CATARACT EXTRACTION, BILATERAL ?09/16/2020: ENDARTERECTOMY; Left ?    Comment:  Procedure: LEFT ENDARTERECTOMY CAROTID;  Surgeon: Donzetta Matters,  ?             Georgia Dom, MD;  Location: [place];  Service:  ?             Vascular;  Laterality: Left; ?No date: HERNIA REPAIR ?No date: PACEMAKER IMPLANT ?No date: TONSILLECTOMY  ? ? ?The history is provided by the patient. No language interpreter was used.  ? ?  ? ?Home Medications ?Prior to Admission medications   ?Medication Sig Start Date End Date Taking? Authorizing Provider  ?acetaminophen (TYLENOL) 325 MG tablet Take 2 tablets (650 mg total) by mouth every 6 (six) hours as needed. 03/04/22  Yes Wynona Dove A, DO  ?lidocaine (LIDODERM) 5  % Place 1 patch onto the skin daily as needed. Remove & Discard patch within 12 hours or as directed by MD 03/04/22  Yes Wynona Dove A, DO  ?albuterol (VENTOLIN HFA) 108 (90 Base) MCG/ACT inhaler Inhale 2 puffs into the lungs 4 (four) times daily as needed for wheezing or shortness of breath. 09/21/21   [place], Betty G, MD  ?apixaban (ELIQUIS) 5 MG TABS tablet Take 1 tablet (5 mg total) by mouth 2 (two) times daily. 01/13/22   [place], Ocie Doyne, MD  ?atorvastatin (LIPITOR) 40 MG tablet Take 1 tablet (40 mg total) by mouth daily. 09/18/20   Shelly Coss, MD  ?cholecalciferol (VITAMIN D) 25 MCG (1000 UNIT) tablet Take 1,000 Units by mouth at bedtime.    [provider]  ?Ferrous Sulfate (IRON) 325 (65 Fe) MG TABS Take 1 tablet by mouth at bedtime.    [provider]  ?Fluticasone Furoate (ARNUITY ELLIPTA) 200 MCG/ACT AEPB Inhale 1 puff into the lungs every morning. 09/02/21   [place], Betty G, MD  ?Magnesium 250 MG TABS Take 250 mg by mouth every morning.    [provider]  ?Multiple Vitamin (MULTIVITAMIN WITH MINERALS) TABS tablet Take 1 tablet by mouth every morning.    [provider]  ?Omega-3 Fatty Acids (FISH OIL PO) Take 1 capsule by mouth every morning.    [provider]  ?omeprazole (PRILOSEC) 40 MG capsule Take 1 capsule (40 mg total) by mouth  daily before supper. 09/16/21   [place], Betty G, MD  ?   ? ?Allergies    ?Patient has no known allergies.   ? ?Review of Systems   ?Review of Systems  ?Constitutional:  Negative for chills and fever.  ?HENT:  Negative for facial swelling and trouble swallowing.   ?Eyes:  Negative for photophobia and visual disturbance.  ?Respiratory:  Negative for cough and shortness of breath.   ?Cardiovascular:  Negative for chest pain and palpitations.  ?Gastrointestinal:  Negative for abdominal pain, nausea and vomiting.  ?Endocrine: Negative for polydipsia and polyuria.  ?Genitourinary:  Negative for difficulty urinating and  hematuria.  ?Musculoskeletal:  Negative for gait problem and joint swelling.  ?Skin:  Negative for pallor and rash.  ?Neurological:  Positive for speech difficulty. Negative for syncope and headaches.  ?Psychiatric/Behavioral:  Positive for confusion. Negative for agitation.   ? ?Physical Exam ?Updated Vital Signs ?BP 117/66   Pulse (!) 59   Temp 97.8 ?F (36.6 ?C) (Oral)   Resp (!) 21   Ht 5\' 10"  (1.778 m)   Wt 70.6 kg   SpO2 98%   BMI 22.33 kg/m?  ?Physical Exam ?Vitals and nursing note reviewed.  ?Constitutional:   ?   General: He is not in acute distress. ?   Appearance: He is well-developed. He is not ill-appearing or diaphoretic.  ?HENT:  ?   Head: Normocephalic. Left periorbital erythema present.  ?   Jaw: There is normal jaw occlusion. No trismus.  ? ?   Comments: Large hematoma left orbit ?Eye appears intact, vision grossly intact, PERRLA, EOMI  ?   Right Ear: External ear normal.  ?   Left Ear: External ear normal.  ?   Mouth/Throat:  ?   Mouth: Mucous membranes are moist.  ?Eyes:  ?   General: No scleral icterus. ?   Extraocular Movements: Extraocular movements intact.  ?   Pupils: Pupils are equal, round, and reactive to light.  ?Cardiovascular:  ?   Rate and Rhythm: Normal rate and regular rhythm.  ?   Pulses: Normal pulses.  ?   Heart sounds: Normal heart sounds.  ?Pulmonary:  ?   Effort: Pulmonary effort is normal. No tachypnea, accessory muscle usage or respiratory distress.  ?   Breath sounds: Normal breath sounds.  ?Abdominal:  ?   General: Abdomen is flat.  ?   Palpations: Abdomen is soft.  ?   Tenderness: There is no abdominal tenderness. There is no guarding or rebound.  ?Musculoskeletal:     ?   General: Normal range of motion.  ?   Cervical back: Full passive range of motion without pain and normal range of motion.  ?   Right lower leg: No edema.  ?   Left lower leg: No edema.  ?Skin: ?   General: Skin is warm and dry.  ?   Capillary Refill: Capillary refill takes less than 2 seconds.   ?Neurological:  ?   Mental Status: He is alert and oriented to person, place, and time.  ?   GCS: GCS eye subscore is 4. GCS verbal subscore is 5. GCS motor subscore is 6.  ?   Cranial Nerves: Cranial nerves 2-12 are intact. No dysarthria or facial asymmetry.  ?   Sensory: Sensation is intact.  ?   Motor: Motor function is intact. No tremor or pronator drift.  ?   Coordination: Coordination is intact. Finger-Nose-Finger Test normal.  ?   Gait: Gait is intact.  ?  Psychiatric:     ?   Mood and Affect: Mood normal.     ?   Behavior: Behavior normal.  ? ? ?ED Results / Procedures / Treatments   ?Labs ?(all labs ordered are listed, but only abnormal results are displayed) ?Labs Reviewed  ?CBC - Abnormal; Notable for the following components:  ?    Result Value  ? RBC 3.57 (*)   ? Hemoglobin 12.1 (*)   ? HCT 35.0 (*)   ? All other components within normal limits  ?COMPREHENSIVE METABOLIC PANEL - Abnormal; Notable for the following components:  ? Sodium 134 (*)   ? CO2 21 (*)   ? Glucose, Bld 136 (*)   ? Creatinine, Ser 1.37 (*)   ? Calcium 8.8 (*)   ? Total Protein 6.2 (*)   ? Total Bilirubin 1.5 (*)   ? GFR, Estimated 52 (*)   ? All other components within normal limits  ?I-STAT CHEM 8, ED - Abnormal; Notable for the following components:  ? Creatinine, Ser 1.30 (*)   ? Glucose, Bld 133 (*)   ? Calcium, Ion 1.12 (*)   ? Hemoglobin 11.6 (*)   ? HCT 34.0 (*)   ? All other components within normal limits  ?CBG MONITORING, ED - Abnormal; Notable for the following components:  ? Glucose-Capillary 133 (*)   ? All other components within normal limits  ?PROTIME-INR  ?APTT  ?DIFFERENTIAL  ?TROPONIN I (HIGH SENSITIVITY)  ?TROPONIN I (HIGH SENSITIVITY)  ? ? ?EKG ?EKG Interpretation ? ?Date/Time:  Saturday March 04 2022 11:15:38 EDT ?Ventricular Rate:  65 ?PR Interval:  154 ?QRS Duration: 110 ?QT Interval:  418 ?QTC Calculation: 435 ?R Axis:   5 ?Text Interpretation: Sinus rhythm Low voltage, precordial leads Confirmed by Wynona Dove ([id number]) on 03/05/2022 8:18:06 AM ? ?Radiology ?DG Elbow 2 Views Left ? ?Result Date: 03/03/2022 ?CLINICAL DATA:  Fall EXAM: LEFT ELBOW - 2 VIEW COMPARISON:  12/25/2020 FINDINGS: Degenerative changes in the left elbow w

## 2022-03-07 ENCOUNTER — Telehealth: Payer: Self-pay

## 2022-03-07 NOTE — Telephone Encounter (Signed)
We received a call from [name] regarding his recent hospital visit for a fall. The ED is requesting that he follow up with our office in one month. He has been seen by [name] in the past for a TIA f/u but since this is a new diagnosis, he would need to follow up with Dr [name] for this new referral. We offered the patient Dr [name] next available appointment in June, but the patient declined and wanted to be seen sooner. ? ?Can you please review and advise where we can place this patient on Dr [name] schedule for a hospital follow up? ? ?Thanks! ?

## 2022-03-07 NOTE — Telephone Encounter (Signed)
I saw him previously for similar event. Okay to schedule with me - please place in hospital follow up slot. Thank you.  ?

## 2022-03-07 NOTE — Progress Notes (Signed)
? ?HPI: ?[name] is a 81 y.o. male with hx of TIA ,atrial fib on chronic anticoagulation,HTN, and HFpEF here today with his wife to follow on recent ED visit. ?He was evaluated in the ED on 03/03/22 for a fall. ?Tripped and fell when leaving [facility], landed on left side on carpet. No LOC. ? ?Left eye brow leceration that required 5 interrupted stitches. Still bleeding intermittently. ?Denies fever,headache,or visual changes. ?Left-sided face with hematoma,ecchymosis,and left rib cage severe pain. ?Pain is interfering with sleep. ?He is taking Tylenol and Lidocaine patch. ?Sleeping on recliner. ? ?Left conjunctival hemorrhage has resolved. ? ?Head CT on 03/03/22:  ?1. Moderate severity left periorbital, left preseptal and left frontal scalp soft tissue swelling.  ?2. No acute intracranial abnormality. ?3. Generalized cerebral atrophy with chronic white matter small vessel ischemic changes. ?Left elbow X ray and forearm X ray negative for acute process. ?CXR negative for active disease or rib fractures. ? ?Cervical CT: ?1. Marked severity degenerative changes throughout the cervical spine, as described above. ?2. 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 ?3. 3 mm to 4 mm anterolisthesis of the C4 vertebral body on C5. ?4. Approximately 2 mm retrolisthesis of C6 on C7. ? ?Right elbow laceration. ? ?Code stroke was initiated on 03/04/22, arrived to the ED via EMS due to changes in speech and confusion. ?Head CT repeated:  ?No evidence of acute intracranial abnormality.  ASPECTS is 10.  ?Moderate chronic small ischemic changes within the cerebral white matter, stable from the prior head CT of 03/03/2022.  ?Mild generalized parenchymal atrophy.  ?Redemonstrated large left periorbital/facial hematoma. ? ?Troponin x 2 wnl. ?Lab Results  ?Component Value Date  ? CREATININE 1.30 (H) 03/04/2022  ? BUN 20 03/04/2022  ? NA 137 03/04/2022  ? K 4.0 03/04/2022  ? CL 101 03/04/2022  ? CO2 21 (L) 03/04/2022  ? ?Lab Results   ?Component Value Date  ? ALT 32 03/04/2022  ? AST 27 03/04/2022  ? ALKPHOS 95 03/04/2022  ? BILITOT 1.5 (H) 03/04/2022  ? ?Evaluated by neurologist and outpt follow up was recommended. ?He called his neurologist yesterday, next appt available in 04/2022. ?He is back to his baseline. ? ?Paroxysmal atrial fib and TIA. ?Currently on Eliquis 5 mg bid and Atorvastatin 40 mg dailuy. ? ?Lab Results  ?Component Value Date  ? CHOL 78 03/12/2021  ? HDL 33 (L) 03/12/2021  ? [place] 31 03/12/2021  ? TRIG 68 03/12/2021  ? CHOLHDL 2.4 03/12/2021  ? ?Anemia: C/O iron is causing constipation. ?Hard stools, once per week. ?He has not noted blood in stool or melena. ?Trying to increase fiber intake. ? ?Lab Results  ?Component Value Date  ? WBC 6.7 03/04/2022  ? HGB 11.6 (L) 03/04/2022  ? HCT 34.0 (L) 03/04/2022  ? MCV 98.0 03/04/2022  ? PLT 161 03/04/2022  ? ?Review of Systems  ?Constitutional:  Positive for activity change and fatigue. Negative for appetite change and fever.  ?HENT:  Negative for nosebleeds and sore throat.   ?Eyes:  Negative for photophobia and visual disturbance.  ?Respiratory:  Negative for cough, shortness of breath and wheezing.   ?Cardiovascular:  Negative for chest pain and palpitations.  ?Gastrointestinal:  Negative for abdominal pain, nausea and vomiting.  ?Genitourinary:  Negative for decreased urine volume, dysuria and hematuria.  ?Musculoskeletal:  Positive for arthralgias, gait problem and myalgias.  ?Neurological:  Negative for seizures, syncope, facial asymmetry and weakness.  ?Rest see pertinent positives and negatives per HPI. ? ?Current Outpatient  Medications on File Prior to Visit  ?Medication Sig Dispense Refill  ? acetaminophen (TYLENOL) 325 MG tablet Take 2 tablets (650 mg total) by mouth every 6 (six) hours as needed. 36 tablet 0  ? albuterol (VENTOLIN HFA) 108 (90 Base) MCG/ACT inhaler Inhale 2 puffs into the lungs 4 (four) times daily as needed for wheezing or shortness of breath. 8 g 1  ?  apixaban (ELIQUIS) 5 MG TABS tablet Take 1 tablet (5 mg total) by mouth 2 (two) times daily. 180 tablet 1  ? atorvastatin (LIPITOR) 40 MG tablet Take 1 tablet (40 mg total) by mouth daily. 30 tablet 1  ? cholecalciferol (VITAMIN D) 25 MCG (1000 UNIT) tablet Take 1,000 Units by mouth at bedtime.    ? Ferrous Sulfate (IRON) 325 (65 Fe) MG TABS Take 1 tablet by mouth at bedtime.    ? Fluticasone Furoate (ARNUITY ELLIPTA) 200 MCG/ACT AEPB Inhale 1 puff into the lungs every morning. 90 each 3  ? lidocaine (LIDODERM) 5 % Place 1 patch onto the skin daily as needed. Remove & Discard patch within 12 hours or as directed by MD 15 patch 0  ? Magnesium 250 MG TABS Take 250 mg by mouth every morning.    ? Multiple Vitamin (MULTIVITAMIN WITH MINERALS) TABS tablet Take 1 tablet by mouth every morning.    ? Omega-3 Fatty Acids (FISH OIL PO) Take 1 capsule by mouth every morning.    ? omeprazole (PRILOSEC) 40 MG capsule Take 1 capsule (40 mg total) by mouth daily before supper. 90 capsule 2  ? ?No current facility-administered medications on file prior to visit.  ? ? ?Past Medical History:  ?Diagnosis Date  ? Carotid artery occlusion   ? GERD (gastroesophageal reflux disease)   ? Hypertension   ? Stroke [facility]) 02/2021  ? TIA  ? ?No Known Allergies ? ?Social History  ? ?Socioeconomic History  ? Marital status: Married  ?  Spouse name: Fraser Din  ? Number of children: Not on file  ? Years of education: Not on file  ? Highest education level: Not on file  ?Occupational History  ? Not on file  ?Tobacco Use  ? Smoking status: Former  ? Smokeless tobacco: Never  ?Vaping Use  ? Vaping Use: Never used  ?Substance and Sexual Activity  ? Alcohol use: Yes  ?  Comment: occassional  ? Drug use: Never  ? Sexual activity: Not Currently  ?Other Topics Concern  ? Not on file  ?Social History Narrative  ? Lives with wife  ? ?Social Determinants of Health  ? ?Financial Resource Strain: Low Risk   ? Difficulty of Paying Living Expenses: Not hard at all   ?Food Insecurity: No Food Insecurity  ? Worried About Charity fundraiser in the Last Year: Never true  ? Ran Out of Food in the Last Year: Never true  ?Transportation Needs: No Transportation Needs  ? Lack of Transportation (Medical): No  ? Lack of Transportation (Non-Medical): No  ?Physical Activity: Insufficiently Active  ? Days of Exercise per Week: 5 days  ? Minutes of Exercise per Session: 10 min  ?Stress: No Stress Concern Present  ? Feeling of Stress : Not at all  ?Social Connections: Moderately Integrated  ? Frequency of Communication with Friends and Family: More than three times a week  ? Frequency of Social Gatherings with Friends and Family: More than three times a week  ? Attends Religious Services: 1 to 4 times per year  ? Active  Member of Clubs or Organizations: No  ? Attends Archivist Meetings: Never  ? Marital Status: Married  ? ? ?Vitals:  ? 03/08/22 1009  ?BP: 126/80  ?Pulse: 81  ?Resp: 16  ?SpO2: 96%  ? ?Body mass index is 22.33 kg/m?. ? ?Physical Exam ?Vitals and nursing note reviewed.  ?Constitutional:   ?   General: He is not in acute distress. ?   Appearance: He is well-developed.  ?HENT:  ?   Head: Normocephalic and atraumatic.  ?   Mouth/Throat:  ?   Mouth: Mucous membranes are moist.  ?   Pharynx: Oropharynx is clear.  ?Eyes:  ?   Extraocular Movements: Extraocular movements intact.  ?   Conjunctiva/sclera: Conjunctivae normal.  ?   Pupils: Pupils are equal, round, and reactive to light.  ?Cardiovascular:  ?   Rate and Rhythm: Normal rate and regular rhythm.  ?   Heart sounds: No murmur heard. ?Pulmonary:  ?   Effort: Pulmonary effort is normal. No respiratory distress.  ?   Breath sounds: Normal breath sounds.  ?Abdominal:  ?   Palpations: Abdomen is soft. There is no mass.  ?   Tenderness: There is no abdominal tenderness.  ?Musculoskeletal:  ?   Right elbow: Swelling and laceration (Under lateral epicondyle , proximal forearm.) present. Normal range of motion.   ?Lymphadenopathy:  ?   Cervical: No cervical adenopathy.  ?Skin: ?   General: Skin is warm.  ?   Findings: Ecchymosis present. No erythema or rash.  ?   Comments: Right elbow linear superficial laceration, about 2.5-3 cm.

## 2022-03-08 ENCOUNTER — Telehealth: Payer: Self-pay | Admitting: Family Medicine

## 2022-03-08 ENCOUNTER — Ambulatory Visit (INDEPENDENT_AMBULATORY_CARE_PROVIDER_SITE_OTHER): Payer: Medicare Other | Admitting: Family Medicine

## 2022-03-08 ENCOUNTER — Encounter: Payer: Self-pay | Admitting: Family Medicine

## 2022-03-08 ENCOUNTER — Inpatient Hospital Stay: Payer: Medicare Other | Admitting: Family Medicine

## 2022-03-08 VITALS — BP 126/80 | HR 81 | Resp 16 | Ht 70.0 in | Wt 155.6 lb

## 2022-03-08 DIAGNOSIS — S0181XD Laceration without foreign body of other part of head, subsequent encounter: Secondary | ICD-10-CM | POA: Diagnosis not present

## 2022-03-08 DIAGNOSIS — I480 Paroxysmal atrial fibrillation: Secondary | ICD-10-CM

## 2022-03-08 DIAGNOSIS — G459 Transient cerebral ischemic attack, unspecified: Secondary | ICD-10-CM

## 2022-03-08 DIAGNOSIS — W19XXXD Unspecified fall, subsequent encounter: Secondary | ICD-10-CM

## 2022-03-08 DIAGNOSIS — R0781 Pleurodynia: Secondary | ICD-10-CM

## 2022-03-08 DIAGNOSIS — K5900 Constipation, unspecified: Secondary | ICD-10-CM

## 2022-03-08 MED ORDER — TRAMADOL HCL 50 MG PO TABS: 50.0000 mg | ORAL_TABLET | Freq: Every evening | ORAL | 0 refills | Status: AC | PRN

## 2022-03-08 NOTE — Telephone Encounter (Signed)
fyi

## 2022-03-08 NOTE — Patient Instructions (Addendum)
.  A few things to remember from today's visit: ? ?Costal margin pain - Plan: traMADol (ULTRAM) 50 MG tablet ? ?Facial laceration, subsequent encounter ? ?Fall, subsequent encounter ? ?TIA (transient ischemic attack) ? ?PAF (paroxysmal atrial fibrillation) ([place]) ? ?If you need refills please call your pharmacy. ?Do not use My Chart to request refills or for acute issues that need immediate attention. ?  ?Please be sure medication list is accurate. ?If a new problem present, please set up appointment sooner than planned today. ?Keep wounds uncovered during the day, clean with soa and water. ?Because left eye brow laceration is still bleeding, will leave stiches longer, Monday will remove them. ?Tramadol at bedtime, can be taken with Tylenol x 1. ? ?No changes in rest medications today. ? ? ? ? ? ? ? ?

## 2022-03-08 NOTE — Telephone Encounter (Signed)
Pt calling to advise that the in house home health care provider is [facility] [phone] ?

## 2022-03-10 NOTE — Progress Notes (Signed)
? ? ?ACUTE VISIT ?Chief Complaint  ?Patient presents with  ? Suture / Staple Removal  ? ?HPI: ?[name] is a 81 y.o. male, who is here today with his wife have suture removed. ? ?He was last seen on 03/08/22 for ED follow up. ?Fall on 03/03/22. ?Laceration on left eye brow , 5 stitches,not longer oozing. ?Hematoma and edema improving. ?Negative for headache, visual changes,or eye pain. ? ?Right elbow skin tear not longer bleeding, it is "drying." ? ?His wife is concerned about LE edema. ?LE edema, worse at the end of the day. ?Negative for erythema or edema. ?He has not noted SOB,orthopnea,or PND. ? ?HLD and aortic atherosclerosis: He is on Atorvastatin 40 mg daily. ?Lab Results  ?Component Value Date  ? CHOL 78 03/12/2021  ? HDL 33 (L) 03/12/2021  ? Fisher 31 03/12/2021  ? TRIG 68 03/12/2021  ? CHOLHDL 2.4 03/12/2021  ? ?Review of Systems  ?Constitutional:  Negative for chills and fever.  ?Respiratory:  Negative for cough and wheezing.   ?Cardiovascular:  Negative for chest pain and palpitations.  ?Gastrointestinal:  Negative for abdominal pain, nausea and vomiting.  ?Musculoskeletal:  Positive for arthralgias and gait problem.  ?Neurological:  Negative for syncope and facial asymmetry.  ?Rest see pertinent positives and negatives per HPI. ? ?Current Outpatient Medications on File Prior to Visit  ?Medication Sig Dispense Refill  ? acetaminophen (TYLENOL) 325 MG tablet Take 2 tablets (650 mg total) by mouth every 6 (six) hours as needed. 36 tablet 0  ? albuterol (VENTOLIN HFA) 108 (90 Base) MCG/ACT inhaler Inhale 2 puffs into the lungs 4 (four) times daily as needed for wheezing or shortness of breath. 8 g 1  ? apixaban (ELIQUIS) 5 MG TABS tablet Take 1 tablet (5 mg total) by mouth 2 (two) times daily. 180 tablet 1  ? atorvastatin (LIPITOR) 40 MG tablet Take 1 tablet (40 mg total) by mouth daily. 30 tablet 1  ? cholecalciferol (VITAMIN D) 25 MCG (1000 UNIT) tablet Take 1,000 Units by mouth at bedtime.    ?  Ferrous Sulfate (IRON) 325 (65 Fe) MG TABS Take 1 tablet by mouth at bedtime.    ? Fluticasone Furoate (ARNUITY ELLIPTA) 200 MCG/ACT AEPB Inhale 1 puff into the lungs every morning. 90 each 3  ? lidocaine (LIDODERM) 5 % Place 1 patch onto the skin daily as needed. Remove & Discard patch within 12 hours or as directed by MD 15 patch 0  ? Magnesium 250 MG TABS Take 250 mg by mouth every morning.    ? Multiple Vitamin (MULTIVITAMIN WITH MINERALS) TABS tablet Take 1 tablet by mouth every morning.    ? Omega-3 Fatty Acids (FISH OIL PO) Take 1 capsule by mouth every morning.    ? omeprazole (PRILOSEC) 40 MG capsule Take 1 capsule (40 mg total) by mouth daily before supper. 90 capsule 2  ? traMADol (ULTRAM) 50 MG tablet Take 1 tablet (50 mg total) by mouth at bedtime as needed for up to 10 days. 10 tablet 0  ? ?No current facility-administered medications on file prior to visit.  ? ?Past Medical History:  ?Diagnosis Date  ? Carotid artery occlusion   ? GERD (gastroesophageal reflux disease)   ? Hypertension   ? Stroke [facility]) 02/2021  ? TIA  ? ?No Known Allergies ? ?Social History  ? ?Socioeconomic History  ? Marital status: Married  ?  Spouse name: Fraser Din  ? Number of children: Not on file  ? Years of education:  Not on file  ? Highest education level: Not on file  ?Occupational History  ? Not on file  ?Tobacco Use  ? Smoking status: Former  ? Smokeless tobacco: Never  ?Vaping Use  ? Vaping Use: Never used  ?Substance and Sexual Activity  ? Alcohol use: Yes  ?  Comment: occassional  ? Drug use: Never  ? Sexual activity: Not Currently  ?Other Topics Concern  ? Not on file  ?Social History Narrative  ? Lives with wife  ? ?Social Determinants of Health  ? ?Financial Resource Strain: Low Risk   ? Difficulty of Paying Living Expenses: Not hard at all  ?Food Insecurity: No Food Insecurity  ? Worried About Charity fundraiser in the Last Year: Never true  ? Ran Out of Food in the Last Year: Never true  ?Transportation Needs: No  Transportation Needs  ? Lack of Transportation (Medical): No  ? Lack of Transportation (Non-Medical): No  ?Physical Activity: Insufficiently Active  ? Days of Exercise per Week: 5 days  ? Minutes of Exercise per Session: 10 min  ?Stress: No Stress Concern Present  ? Feeling of Stress : Not at all  ?Social Connections: Moderately Integrated  ? Frequency of Communication with Friends and Family: More than three times a week  ? Frequency of Social Gatherings with Friends and Family: More than three times a week  ? Attends Religious Services: 1 to 4 times per year  ? Active Member of Clubs or Organizations: No  ? Attends Archivist Meetings: Never  ? Marital Status: Married  ? ?Vitals:  ? 03/13/22 1100  ?BP: 128/70  ?Pulse: 85  ?Resp: 16  ?SpO2: 93%  ? ?Body mass index is 22.33 kg/m?. ? ?Physical Exam ?Vitals and nursing note reviewed.  ?Constitutional:   ?   General: He is not in acute distress. ?   Appearance: He is well-developed.  ?HENT:  ?   Head: Normocephalic and atraumatic.  ?Eyes:  ?   Conjunctiva/sclera: Conjunctivae normal.  ?Pulmonary:  ?   Effort: Pulmonary effort is normal. No respiratory distress.  ?Musculoskeletal:  ?   Right lower leg: 1+ Pitting Edema present.  ?   Left lower leg: 1+ Pitting Edema present.  ?Skin: ?   General: Skin is warm.  ?   Findings: Ecchymosis (Periocular , left hemifacial,and neck.) present. No erythema or rash.  ?   Comments: Left upper eye lid hematoma. Laceration along eye lid with interrupted sutures x 4, dry blood crusty areas, no active bleeding.  ?Neurological:  ?   Mental Status: He is alert and oriented to person, place, and time.  ?   Comments: Unstable gait assisted by walker.  ?Psychiatric:  ?   Comments: Well groomed, good eye contact.  ? ?ASSESSMENT AND PLAN: ? ?[name] was seen today for suture / staple removal. ? ?Diagnoses and all orders for this visit: ? ?Facial laceration, subsequent encounter ?Left upper eye lid cleaned with alcohol. ?Stitches  embedded in sanguinolent crusts, so some were difficulty to find. I did not see a 5th suture, it may have fallen. ?Sutures x 4 removed. ?He tolerated procedure well. ?Continue keeping area clean with soap and water and monitor for signs of infection. ? ?Bilateral lower extremity edema ?LE elevation above heart level and compression stocking. ?Appropriate skin care. ?For now I prefer to hold on diuretic but can be considered if problem gets worse. ? ?Atherosclerosis of aorta ([place]) ?On Atorvastatin 40 mg daily. ? ?Return if symptoms  worsen or fail to improve. ? ?I spent a total of 31 minutes in both face to face and non face to face activities for this visit on the date of this encounter. During this time history was obtained and documented, examination was performed, and assessment/plan discussed. ? ?Jandiel Magallanes G. [place], MD ? ?[place]. ?[place] office. ? ?

## 2022-03-13 ENCOUNTER — Encounter: Payer: Self-pay | Admitting: Family Medicine

## 2022-03-13 ENCOUNTER — Ambulatory Visit (INDEPENDENT_AMBULATORY_CARE_PROVIDER_SITE_OTHER): Payer: Medicare Other | Admitting: Family Medicine

## 2022-03-13 VITALS — BP 128/70 | HR 85 | Resp 16 | Ht 70.0 in

## 2022-03-13 DIAGNOSIS — I700 Atherosclerosis of aorta: Secondary | ICD-10-CM | POA: Diagnosis not present

## 2022-03-13 DIAGNOSIS — R600 Localized edema: Secondary | ICD-10-CM

## 2022-03-13 DIAGNOSIS — S0181XD Laceration without foreign body of other part of head, subsequent encounter: Secondary | ICD-10-CM

## 2022-03-24 ENCOUNTER — Telehealth: Payer: Self-pay | Admitting: Family Medicine

## 2022-03-24 DIAGNOSIS — G459 Transient cerebral ischemic attack, unspecified: Secondary | ICD-10-CM

## 2022-03-24 DIAGNOSIS — W19XXXD Unspecified fall, subsequent encounter: Secondary | ICD-10-CM

## 2022-03-24 NOTE — Telephone Encounter (Signed)
Requesting a referral for rehab, patient had a fall. [facility] [phone] (f) ?

## 2022-03-27 ENCOUNTER — Encounter: Payer: Self-pay | Admitting: Adult Health

## 2022-03-27 ENCOUNTER — Ambulatory Visit (INDEPENDENT_AMBULATORY_CARE_PROVIDER_SITE_OTHER): Payer: Medicare Other | Admitting: Adult Health

## 2022-03-27 VITALS — BP 124/60 | HR 60 | Ht 70.0 in | Wt 161.0 lb

## 2022-03-27 DIAGNOSIS — R600 Localized edema: Secondary | ICD-10-CM

## 2022-03-27 DIAGNOSIS — I480 Paroxysmal atrial fibrillation: Secondary | ICD-10-CM

## 2022-03-27 DIAGNOSIS — R479 Unspecified speech disturbances: Secondary | ICD-10-CM

## 2022-03-27 DIAGNOSIS — Z8673 Personal history of transient ischemic attack (TIA), and cerebral infarction without residual deficits: Secondary | ICD-10-CM

## 2022-03-27 DIAGNOSIS — Z9889 Other specified postprocedural states: Secondary | ICD-10-CM

## 2022-03-27 NOTE — Patient Instructions (Addendum)
You will be called to schedule a lower extremity ultrasound to ensure no clots causing your swelling ? ?Continue Eliquis (apixaban) daily  and atorvastatin for secondary stroke prevention ? ?Continue to follow up with PCP regarding cholesterol and blood pressure management  ?Maintain strict control of hypertension with blood pressure goal below 130/90 and cholesterol with LDL cholesterol (bad cholesterol) goal below 70 mg/dL.  ? ?Signs of a Stroke? Follow the BEFAST method:  ?Balance Watch for a sudden loss of balance, trouble with coordination or vertigo ?Eyes Is there a sudden loss of vision in one or both eyes? Or double vision?  ?Face: Ask the person to smile. Does one side of the face droop or is it numb?  ?Arms: Ask the person to raise both arms. Does one arm drift downward? Is there weakness or numbness of a leg? ?Speech: Ask the person to repeat a simple phrase. Does the speech sound slurred/strange? Is the person confused ? ?Time: If you observe any of these signs, call 911. ? ? ? ? ?Followup in the future with Dr. Pearlean Brownie in 4 months or call earlier if needed ? ? ? ? ? ? ?Thank you for coming to see [place] at [facility]. I hope we have been able to provide you high quality care today. ? ?You may receive a patient satisfaction survey over the next few weeks. We would appreciate your feedback and comments so that we may continue to improve ourselves and the health of our patients. ? ?

## 2022-03-27 NOTE — Telephone Encounter (Signed)
Referral placed.

## 2022-03-27 NOTE — Progress Notes (Signed)
?[facility] ?[id number] ?[place]. [place] [address] ?[phone] ? ?     OFFICE FOLLOW UP NOTE ? ?[name] ?Date of Birth:  [birth year] ?Medical Record Number:  [id number]  ? ?Reason for visit: Hospital follow-up ? ? ? ?SUBJECTIVE: ? ? ?CHIEF COMPLAINT:  ?Chief Complaint  ?Patient presents with  ? Follow-up  ?  RM 3 with spouse [name]  ?Pt is well and stable from stroke standpoint   ? ? ? ?HPI:  ? ?Update 03/27/2022 JM: Patient returns for recent hospital follow-up.  He is accompanied by his wife.  He presented to ED on 4/22 with altered mental status. Per ED note, spouse reported episode of confusion, difficulty speaking and difficulty holding his body upright which lasted approximately 30 to 45 minutes.  Upon arrival to ED, returned back to baseline. Of note, was seen the day prior, 4/21, after a mechanical fall with CTH no acute finding and all other work up unremarkable.  Did require laceration repair of left eyebrow. 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 MRI unable to be completed d/t noncompatible pacer.  Presenting episode similar to prior episodes for which he was dx'd with TIA.  Evaluated by Dr. Wilford Corner who noted unclear if episode consistent with TIA vs speech arrest with some amount of cognitive decline.  Advised continued use of Eliquis and atorvastatin and recommended outpatient follow-up. ? ?He has since been stable since that time without any reoccurring episodes or new stroke/TIA symptoms.  He has not had any additional falls, ambulates long distance and outdoors with RW for some continued imbalance and use of cane indoors/short distance. Plans on starting PT this week.  He denies any significant memory loss or cognitive issues, admits to mild age-related memory loss which wife agrees  with although he initially discusses his fall for which he was being seen for today.  His wife reminded him about discussing his TIA but he had difficulty recalling this event.  Able to maintain ADLs independently.  He no longer drives.  Denies any family history of Alzheimer's dementia.  Denies any headache associated with current episode nor any history of migraine headaches. Does report mother had bad migraine headaches.  No family history of seizures. ? ?Per wife, the morning of ED visit on 4/22, she attempted to help him to stand to go to the bathroom but had difficulty standing.  Eventually able to stand and get to the bathroom.  Once out of the bathroom, he started to feel dizzy where wife had to sit him on the couch then had difficulty responding to wife.  He did not lose consciousness but wife would have to repeat a question multiple times before he would answer but he would only answer with short/simple speech. Once EMS arrived, symptoms gradually improving and completely resolved upon ED presentation. Wife unsure of BP at that time, believes this was obtained by EMS.  Blood pressure routinely monitored at home around 1130pm and typically 130-140s/60-70s.  Blood pressure today 124/60. ? ?Wife also mentions LE swelling which has been present since his fall. Attempted use of compression stockings but difficulty getting them on and off. Trying to watch diet and avoiding high sodium foods but as meals are prepared for him (lives in assisted living facility), this can be difficult at times. Tries to keep legs elevated as  able. ? ?He did previously complete sleep study in 09/2021 which did not show any significant sleep apnea.   ? ?Routinely follows with PCP, cardiology and vascular surgery.  ? ?No further concerns at this time. ? ? ? ? ?CT HEAD 02/2022 ?IMPRESSION: ?No evidence of acute intracranial abnormality.  ASPECTS is 10. ?  ?Moderate chronic small ischemic changes within the cerebral white ?matter, stable  from the prior head CT of 03/03/2022. ?  ?Mild generalized parenchymal atrophy. ?  ?Redemonstrated large left periorbital/facial hematoma. ? ? ?CTA HEAD/NECK 02/2022 ?CTA neck: ?1. The common carotid and internal carotid arteries are patent ?within the neck without hemodynamically significant stenosis. ?Atherosclerotic plaque on the right, as described. Redemonstrated ?sequela of prior left carotid endarterectomy. 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 ?2. Vertebral arteries patent within the neck. Unchanged ?mild-to-moderate atherosclerotic narrowing at the origin of the ?dominant left vertebral artery. ?3. Redemonstrated enlarged and heterogeneous thyroid gland. Given ?the patient's advanced age, a nonemergent thyroid ultrasound may be ?obtained if clinically appropriate. ?  ?CTA head: ?1. No emergent large vessel occlusion identified. ?2. Calcified plaque within the intracranial internal carotid ?arteries and V4 left vertebral artery with no more than mild ?stenosis. ? ? ? ? ?History provided for reference purposes only ?Update 09/07/2021 JM: Returns for 5534-month stroke follow-up accompanied by his wife.  Doing well since prior visit.  No reoccurring or new stroke/TIA symptoms since prior visit.  Denies any recent falls.  Compliant on aspirin, Eliquis and atorvastatin -denies side effects.  Routinely followed by PCP, cardiology and vascular surgery.  Evaluated by Dr. Frances FurbishAthar 08/04/2021 and scheduled for sleep study on 11/16. No new concerns at this time.  ? ?Update 06/01/2021 JM: [name] returns for follow-up visit regarding recurrent TIAs.  Since he was last seen 5 months ago, he presented to ED on 03/11/2021 for likely recrudescence of expressive aphasia in setting of hypotension (88/51) and AKI (per ED note).  Imaging and work-up unremarkable.  He has not had any additional events since that time.   Reports compliance on Eliquis, aspirin 81 mg daily and atorvastatin 40 mg daily without associated side effects.  Blood pressure today 124/66. Routinely monitors at home and has been stable. Has follow up with Dr. Randie Heinzain on 9/1 for carotid duplex. He is interested in pursing sleep evaluation at this time. No further concerns at this time.  ? ?Update 01/06/2021 JM: [name] is being seen for recent TIA hospital follow-up.  [facility] ED [facility] with notes, lab work and imaging personally reviewed with reported dizziness and lightheadedness and difficulty speaking after a fall.  He has had prior TIAs with speech difficulty with this event similar to prior episodes but not as bad and symptoms completely resolved.  Evaluated by Dr. Pearlean BrownieSethi with CT head negative and CTA L carotid endarterectomy patent without significant stenosis.  MRI unable to be obtained due to pacer.  LDL 34.  A1c 5.6.  It was felt as though his symptoms possibly more related to syncopal event vs TIA although he does have stroke risk factors including atrial flutter on Eliquis, HLD on atorvastatin, HTN and prediabetes. ? ?Since that time, he has not had any additional or new stroke/TIA symptoms.  ? ?Reports compliance on Eliquis 5 mg twice daily, aspirin 81 mg daily and atorvastatin 40 mg daily -denies side effects ?Blood pressure today 142/72 - monitors at home  and typically stable ? ?No new concerns at this time ? ?Initial visit 10/21/2020 JM: [name] is being seen for hospital follow-up accompanied by his wife.  He has been doing well since discharge without new or reoccurring stroke/TIA symptoms.  Remains on aspirin and Eliquis for secondary stroke prevention with known AF and s/p L CEA without bleeding or bruising.  Remains on atorvastatin 40 mg daily without myalgias.  Blood pressure today satisfactory 136/61.  Wife concerned regarding possible apnea with sleep study 3 years ago in [place], [place] diagnosed with mild sleep apnea per  patient (unable to view via epic) and no treatment indicated.  Since that time, increased nocturia q2-3hrs, witnessed apnea and snoring.  Use of some type of mouthguard and head elevation while sleeping which

## 2022-03-28 ENCOUNTER — Ambulatory Visit (INDEPENDENT_AMBULATORY_CARE_PROVIDER_SITE_OTHER): Payer: Medicare Other

## 2022-03-28 DIAGNOSIS — I5032 Chronic diastolic (congestive) heart failure: Secondary | ICD-10-CM

## 2022-03-28 DIAGNOSIS — Z950 Presence of cardiac pacemaker: Secondary | ICD-10-CM | POA: Diagnosis not present

## 2022-03-28 LAB — CUP PACEART REMOTE DEVICE CHECK
Battery Remaining Longevity: 49 mo
Battery Remaining Percentage: 40 %
Battery Voltage: 2.96 V
Brady Statistic AP VP Percent: 5.5 %
Brady Statistic AP VS Percent: 92 %
Brady Statistic AS VP Percent: 1 %
Brady Statistic AS VS Percent: 1.6 %
Brady Statistic RA Percent Paced: 96 %
Brady Statistic RV Percent Paced: 5.6 %
Date Time Interrogation Session: 20230516020013
Implantable Lead Implant Date: 20160928
Implantable Lead Implant Date: 20160928
Implantable Lead Location: 753859
Implantable Lead Location: 753860
Implantable Lead Model: 1948
Implantable Pulse Generator Implant Date: 20160928
Lead Channel Impedance Value: 390 Ohm
Lead Channel Impedance Value: 530 Ohm
Lead Channel Pacing Threshold Amplitude: 0.75 V
Lead Channel Pacing Threshold Amplitude: 1 V
Lead Channel Pacing Threshold Pulse Width: 0.4 ms
Lead Channel Pacing Threshold Pulse Width: 0.4 ms
Lead Channel Sensing Intrinsic Amplitude: 1.7 mV
Lead Channel Sensing Intrinsic Amplitude: 3.1 mV
Lead Channel Setting Pacing Amplitude: 1.25 V
Lead Channel Setting Pacing Amplitude: 1.75 V
Lead Channel Setting Pacing Pulse Width: 0.4 ms
Lead Channel Setting Sensing Sensitivity: 0.7 mV
Pulse Gen Model: 2240
Pulse Gen Serial Number: 7817736

## 2022-03-30 NOTE — Progress Notes (Signed)
I agree with the above plan 

## 2022-04-07 ENCOUNTER — Telehealth: Payer: Self-pay | Admitting: Family Medicine

## 2022-04-07 NOTE — Telephone Encounter (Signed)
Pt has question regarding a medicine report he received last fall. Low dose aspirin was removed from his list at his most appointment. Wants to know the reason

## 2022-04-07 NOTE — Telephone Encounter (Signed)
Does he still need to take the low dose aspirin?

## 2022-04-11 ENCOUNTER — Encounter: Payer: Self-pay | Admitting: Adult Health

## 2022-04-11 NOTE — Telephone Encounter (Signed)
I called and spoke with patient. He is aware of message below & verbalized understanding.  ?

## 2022-04-11 NOTE — Telephone Encounter (Signed)
I am not sure exactly why it was removed but could be because he is on Eliquis.  Aspirin and Eliquis increase risk of bleeding. Continue just Eliquis for now. Thanks, BJ

## 2022-04-11 NOTE — Telephone Encounter (Signed)
error 

## 2022-04-13 NOTE — Progress Notes (Signed)
Remote pacemaker transmission.   

## 2022-04-15 ENCOUNTER — Other Ambulatory Visit: Payer: Self-pay | Admitting: Family Medicine

## 2022-04-17 ENCOUNTER — Telehealth: Payer: Self-pay | Admitting: Family Medicine

## 2022-04-17 MED ORDER — ATORVASTATIN CALCIUM 40 MG PO TABS: 40.0000 mg | ORAL_TABLET | Freq: Every day | ORAL | 3 refills | Status: DC

## 2022-04-17 NOTE — Telephone Encounter (Signed)
Pt call and stated he need a refill onatorvastatin (LIPITOR) 40 MG tablet sent to  [id number] - [place], [place] - [address] AT [facility] [address] [facility] [address] Phone:  [phone]  Fax:  [phone]

## 2022-04-17 NOTE — Addendum Note (Signed)
Addended by: Kathreen Devoid on: 04/17/2022 08:47 AM   Modules accepted: Orders

## 2022-05-03 ENCOUNTER — Emergency Department (HOSPITAL_COMMUNITY)
Admission: EM | Admit: 2022-05-03 | Discharge: 2022-05-03 | Disposition: A | Discharge status: Home / Self Care | Payer: Medicare Other | Attending: Emergency Medicine | Admitting: Emergency Medicine

## 2022-05-03 ENCOUNTER — Encounter (HOSPITAL_COMMUNITY): Payer: Self-pay

## 2022-05-03 ENCOUNTER — Emergency Department (HOSPITAL_BASED_OUTPATIENT_CLINIC_OR_DEPARTMENT_OTHER)
Admit: 2022-05-03 | Discharge: 2022-05-03 | Disposition: A | Discharge status: Home / Self Care | Payer: Medicare Other | Attending: Emergency Medicine | Admitting: Emergency Medicine

## 2022-05-03 ENCOUNTER — Other Ambulatory Visit: Payer: Self-pay

## 2022-05-03 ENCOUNTER — Emergency Department (HOSPITAL_COMMUNITY): Payer: Medicare Other

## 2022-05-03 DIAGNOSIS — Z950 Presence of cardiac pacemaker: Secondary | ICD-10-CM | POA: Diagnosis not present

## 2022-05-03 DIAGNOSIS — Z7901 Long term (current) use of anticoagulants: Secondary | ICD-10-CM | POA: Diagnosis not present

## 2022-05-03 DIAGNOSIS — M7989 Other specified soft tissue disorders: Secondary | ICD-10-CM | POA: Insufficient documentation

## 2022-05-03 DIAGNOSIS — R4789 Other speech disturbances: Secondary | ICD-10-CM | POA: Insufficient documentation

## 2022-05-03 DIAGNOSIS — R600 Localized edema: Secondary | ICD-10-CM | POA: Insufficient documentation

## 2022-05-03 DIAGNOSIS — R479 Unspecified speech disturbances: Secondary | ICD-10-CM

## 2022-05-03 DIAGNOSIS — R4701 Aphasia: Secondary | ICD-10-CM | POA: Insufficient documentation

## 2022-05-03 LAB — COMPREHENSIVE METABOLIC PANEL WITH GFR
ALT: 30 U/L (ref 0–44)
AST: 25 U/L (ref 15–41)
Albumin: 4 g/dL (ref 3.5–5.0)
Alkaline Phosphatase: 118 U/L (ref 38–126)
Anion gap: 7 (ref 5–15)
BUN: 17 mg/dL (ref 8–23)
CO2: 25 mmol/L (ref 22–32)
Calcium: 9.2 mg/dL (ref 8.9–10.3)
Chloride: 104 mmol/L (ref 98–111)
Creatinine, Ser: 1.01 mg/dL (ref 0.61–1.24)
GFR, Estimated: >60 mL/min
Glucose, Bld: 92 mg/dL (ref 70–99)
Potassium: 3.9 mmol/L (ref 3.5–5.1)
Sodium: 136 mmol/L (ref 135–145)
Total Bilirubin: 1.2 mg/dL (ref 0.3–1.2)
Total Protein: 6.7 g/dL (ref 6.5–8.1)

## 2022-05-03 LAB — CBC
HCT: 37.7 % — ABNORMAL LOW (ref 39.0–52.0)
Hemoglobin: 13.5 g/dL (ref 13.0–17.0)
MCH: 34.6 pg — ABNORMAL HIGH (ref 26.0–34.0)
MCHC: 35.8 g/dL (ref 30.0–36.0)
MCV: 96.7 fL (ref 80.0–100.0)
Platelets: 185 10*3/uL (ref 150–400)
RBC: 3.9 MIL/uL — ABNORMAL LOW (ref 4.22–5.81)
RDW: 12.7 % (ref 11.5–15.5)
WBC: 5.8 10*3/uL (ref 4.0–10.5)
nRBC: 0 % (ref 0.0–0.2)

## 2022-05-03 LAB — URINALYSIS, ROUTINE W REFLEX MICROSCOPIC
Bilirubin Urine: NEGATIVE
Glucose, UA: NEGATIVE mg/dL
Hgb urine dipstick: NEGATIVE
Ketones, ur: NEGATIVE mg/dL
Leukocytes,Ua: NEGATIVE
Nitrite: NEGATIVE
Protein, ur: NEGATIVE mg/dL
Specific Gravity, Urine: 1.009 (ref 1.005–1.030)
pH: 6 (ref 5.0–8.0)

## 2022-05-03 LAB — CBG MONITORING, ED: Glucose-Capillary: 91 mg/dL (ref 70–99)

## 2022-05-03 LAB — TROPONIN I (HIGH SENSITIVITY)
Troponin I (High Sensitivity): 6 ng/L
Troponin I (High Sensitivity): 7 ng/L

## 2022-05-03 LAB — LACTIC ACID, PLASMA: Lactic Acid, Venous: 1.2 mmol/L (ref 0.5–1.9)

## 2022-05-03 NOTE — Progress Notes (Signed)
Left LE venous duplex study completed. Please see CV Proc for preliminary results.  Magdalene River BS, RVT 05/03/2022 4:38 PM

## 2022-05-03 NOTE — ED Notes (Signed)
Pt has not arrived to room yet

## 2022-05-03 NOTE — ED Provider Notes (Signed)
[facility] EMERGENCY DEPARTMENT Provider Note   CSN: [id number] Arrival date & time: 05/03/22  1323     History  Chief Complaint  Patient presents with   expressive aphasia     [name] is a 81 y.o. male.  The history is provided by the patient.  He has a history of episodes of difficulty speaking.  Has been worked up extensively in the past for it.  Potentially TIA versus a respiratory arrest potentially due to dementia by the notes that I have reviewed.  Had another episode today.  Last one was probably in April.  Has had head CTs and CTAs have been reassuring and is seen neurology as an outpatient also, however cannot get an MRI due to a pacemaker.  Already is on Eliquis however.  Had no other factors going along with the difficulty speaking today.  Lasted around 10 to 15 minutes.  No numbness weakness or no confusion or headache with it.  Back at baseline now.     Home Medications Prior to Admission medications   Medication Sig Start Date End Date Taking? Authorizing Provider  acetaminophen (TYLENOL) 325 MG tablet Take 2 tablets (650 mg total) by mouth every 6 (six) hours as needed. 03/04/22   Sloan Leiter, DO  albuterol (VENTOLIN HFA) 108 (90 Base) MCG/ACT inhaler Inhale 2 puffs into the lungs 4 (four) times daily as needed for wheezing or shortness of breath. 09/21/21   [place], Betty G, MD  apixaban (ELIQUIS) 5 MG TABS tablet Take 1 tablet (5 mg total) by mouth 2 (two) times daily. 01/13/22   Camnitz, Andree Coss, MD  atorvastatin (LIPITOR) 40 MG tablet Take 1 tablet (40 mg total) by mouth daily. 04/17/22   [place], Betty G, MD  cholecalciferol (VITAMIN D) 25 MCG (1000 UNIT) tablet Take 1,000 Units by mouth at bedtime.    [provider]  Ferrous Sulfate (IRON) 325 (65 Fe) MG TABS Take 1 tablet by mouth at bedtime.    [provider]  Fluticasone Furoate (ARNUITY ELLIPTA) 200 MCG/ACT AEPB Inhale 1 puff into the lungs every morning. 09/02/21    [place], Betty G, MD  lidocaine (LIDODERM) 5 % Place 1 patch onto the skin daily as needed. Remove & Discard patch within 12 hours or as directed by MD 03/04/22   Sloan Leiter, DO  Magnesium 250 MG TABS Take 250 mg by mouth every morning.    [provider]  Multiple Vitamin (MULTIVITAMIN WITH MINERALS) TABS tablet Take 1 tablet by mouth every morning.    [provider]  Omega-3 Fatty Acids (FISH OIL PO) Take 1 capsule by mouth every morning.    [provider]  omeprazole (PRILOSEC) 40 MG capsule Take 1 capsule (40 mg total) by mouth daily before supper. 09/16/21   [place], Betty G, MD      Allergies    Patient has no known allergies.    Review of Systems   Review of Systems  Physical Exam Updated Vital Signs BP (!) 151/78   Pulse 64   Temp 98 F (36.7 C) (Oral)   Resp 18   SpO2 100%  Physical Exam Vitals and nursing note reviewed.  HENT:     Head: Normocephalic.  Cardiovascular:     Rate and Rhythm: Regular rhythm.  Abdominal:     Tenderness: There is no abdominal tenderness.  Musculoskeletal:        General: No tenderness.     Cervical back: Neck  supple.  Skin:    General: Skin is warm.  Neurological:     General: No focal deficit present.     Mental Status: He is alert and oriented to person, place, and time.     Comments: Awake and appropriate with fluent speech.  Moving all extremities.     ED Results / Procedures / Treatments   Labs (all labs ordered are listed, but only abnormal results are displayed) Labs Reviewed  CBC - Abnormal; Notable for the following components:      Result Value   RBC 3.90 (*)    HCT 37.7 (*)    MCH 34.6 (*)    All other components within normal limits  COMPREHENSIVE METABOLIC PANEL  LACTIC ACID, PLASMA  URINALYSIS, ROUTINE W REFLEX MICROSCOPIC  CBG MONITORING, ED  TROPONIN I (HIGH SENSITIVITY)  TROPONIN I (HIGH SENSITIVITY)    EKG None  Radiology VAS [place] LOWER EXTREMITY VENOUS (DVT)  (7a-7p)  Result Date: 05/03/2022  Lower Venous DVT Study Patient Name:  [name]  Date of Exam:   05/03/2022 Medical Rec #: [id number]      Accession #:    [id number] Date of Birth: Jul 26, 1941      Patient Gender: M Patient Age:   29 years Exam Location:  [facility] Procedure:      VAS [place] LOWER EXTREMITY VENOUS (DVT) Referring Phys: Stevphen Meuse FONDAW --------------------------------------------------------------------------------  Indications: Swelling.  Comparison Study: No previous exam noted. Performing Technologist: Magdalene River BS, RVT  Examination Guidelines: A complete evaluation includes B-mode imaging, spectral Doppler, color Doppler, and power Doppler as needed of all accessible portions of each vessel. Bilateral testing is considered an integral part of a complete examination. Limited examinations for reoccurring indications may be performed as noted. The reflux portion of the exam is performed with the patient in reverse Trendelenburg.  +-----+---------------+---------+-----------+----------+--------------+ RIGHTCompressibilityPhasicitySpontaneityPropertiesThrombus Aging +-----+---------------+---------+-----------+----------+--------------+ CFV  Full           Yes      Yes                                 +-----+---------------+---------+-----------+----------+--------------+   +---------+---------------+---------+-----------+----------+--------------+ LEFT     CompressibilityPhasicitySpontaneityPropertiesThrombus Aging +---------+---------------+---------+-----------+----------+--------------+ CFV      Full           Yes      Yes                                 +---------+---------------+---------+-----------+----------+--------------+ SFJ      Full                                                        +---------+---------------+---------+-----------+----------+--------------+ FV Prox  Full                                                         +---------+---------------+---------+-----------+----------+--------------+ FV Mid   Full                                                        +---------+---------------+---------+-----------+----------+--------------+  FV DistalFull                                                        +---------+---------------+---------+-----------+----------+--------------+ PFV      Full                                                        +---------+---------------+---------+-----------+----------+--------------+ POP      Full           Yes      Yes                                 +---------+---------------+---------+-----------+----------+--------------+ PTV      Full                                                        +---------+---------------+---------+-----------+----------+--------------+ PERO     Full                                                        +---------+---------------+---------+-----------+----------+--------------+     Summary: RIGHT: - No evidence of common femoral vein obstruction.  LEFT: - No evidence of deep vein thrombosis in the lower extremity. No indirect evidence of obstruction proximal to the inguinal ligament. - No cystic structure found in the popliteal fossa.  *See table(s) above for measurements and observations.    Preliminary    CT HEAD WO CONTRAST ( )  Result Date: 05/03/2022 CLINICAL DATA:  Aphasia. EXAM: CT HEAD WITHOUT CONTRAST TECHNIQUE: Contiguous axial images were obtained from the base of the skull through the vertex without intravenous contrast. RADIATION DOSE REDUCTION: This exam was performed according to the departmental dose-optimization program which includes automated exposure control, adjustment of the mA and/or kV according to patient size and/or use of iterative reconstruction technique. COMPARISON:  March 04, 2022. FINDINGS: Brain: Mild chronic ischemic white matter disease is noted. Mild diffuse cortical atrophy is  noted. No mass effect or midline shift is noted. Ventricular size is within normal limits. There is no evidence of mass lesion, hemorrhage or acute infarction. Vascular: No hyperdense vessel or unexpected calcification. Skull: Normal. Negative for fracture or focal lesion. Sinuses/Orbits: No acute finding. Other: None. IMPRESSION: No acute intracranial abnormality seen. Electronically Signed   By: Lupita Raider M.D.   On: 05/03/2022 15:43   DG Chest 2 View  Result Date: 05/03/2022 CLINICAL DATA:  Aphasia EXAM: CHEST - 2 VIEW COMPARISON:  Chest 03/04/2022 FINDINGS: Left subclavian dual lead pacemaker unchanged. Heart size and vascularity normal. Negative for heart failure. Lungs are clear without infiltrate or effusion. Moderate hiatal hernia. Thoracic kyphosis in the lower thoracic spine. IMPRESSION: No active cardiopulmonary disease. Electronically Signed   By: Marlan Palau M.D.   On: 05/03/2022 15:02  Procedures Procedures    Medications Ordered in ED Medications - No data to display  ED Course/ Medical Decision Making/ A&P                           Medical Decision Making  Patient presented with difficulty speaking.  History of same.  Differential diagnosis for the aphasia is on includes conditions such as TIA, stroke, intracranial hemorrhage. Fortunately has had episodes like this in the past.  Worked up extensively without clear cause but unable to get an MRI due to pacer.  Head CT done and independently interpreted and showed no acute abnormality.  Discussed with Sal from neurology.  He reviewed the notes also and previous and current imaging.  Do not feel we need further work-up at this time.  Potentially would like an MRI but cannot get it with his pacemaker.  He is already maximized medical treatment being on Eliquis.  Will discharge home to follow-up with his neurologist no urinary symptoms.  No chest pain.  Has had chronic swelling of his left leg.  Ultrasound done and showed no  clot.        Final Clinical Impression(s) / ED Diagnoses Final diagnoses:  Speech disturbance, unspecified type    Rx / DC Orders ED Discharge Orders     None         Benjiman Core, MD 05/03/22 2029

## 2022-05-03 NOTE — ED Provider Triage Note (Addendum)
Emergency Medicine Provider Triage Evaluation Note  [name] , a 81 y.o. male  was evaluated in triage.  Pt complains of an episode of disorientation and difficulty forming words.     Review of Systems  Positive: Aphasia improved  Negative: Fever   Physical Exam  BP (!) 149/82 (BP Location: Left Arm)   Pulse 60   Temp 98.3 F (36.8 C) (Oral)   Resp 16   SpO2 99%  Gen:   Awake, no distress   Resp:  Normal effort  MSK:   Moves extremities without difficulty  Other:  Left lower extremity with slightly more significant pitting edema than right  Alert and oriented to self, place, time and event.   Speech is fluent, clear without dysarthria or dysphasia.   Strength 5/5 in upper/lower extremities   Sensation intact in upper/lower extremities   Normal gait.  Negative Romberg. No pronator drift.  Normal finger-to-nose and feet tapping.  CN I not tested  CN II grossly intact visual fields bilaterally. Did not visualize posterior eye.  CN III, IV, VI PERRLA and EOMs intact bilaterally  CN V Intact sensation to sharp and light touch to the face  CN VII facial movements symmetric  CN VIII not tested  CN IX, X no uvula deviation, symmetric rise of soft palate  CN XI 5/5 SCM and trapezius strength bilaterally  CN XII Midline tongue protrusion, symmetric L/R movements    Medical Decision Making  Medically screening exam initiated at 2:36 PM.  Appropriate orders placed.  Starling Christofferson was informed that the remainder of the evaluation will be completed by another provider, this initial triage assessment does not replace that evaluation, and the importance of remaining in the ED until their evaluation is complete.  On eliquis and sx improving - not TNK candidate and currently neuro intact.  Will order CT head/   Pacemaker - not MRI compatible   Left lower extremity ultrasound to evaluate for DVT.   Gailen Shelter, [place] 05/03/22 1444    Solon Augusta [place], [place] 05/03/22 1447

## 2022-05-03 NOTE — Discharge Instructions (Signed)
Your symptoms resolved resolved.  Your head CT is reassuring.  It appears to be similar to the upper episodes you have.  Follow-up with your doctors as needed.

## 2022-05-03 NOTE — ED Triage Notes (Signed)
Pt arrived POV c/o expressive aphasia that started right before coming and then resolved on its own. Pt states his LKW was 10am this morning and symptoms were discovered around 11am. 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

## 2022-05-09 ENCOUNTER — Ambulatory Visit: Payer: Medicare Other

## 2022-05-09 ENCOUNTER — Ambulatory Visit (INDEPENDENT_AMBULATORY_CARE_PROVIDER_SITE_OTHER): Payer: Medicare Other

## 2022-05-09 VITALS — BP 128/60 | HR 63 | Temp 97.8°F | Ht 66.0 in | Wt 156.0 lb

## 2022-05-09 DIAGNOSIS — Z0000 Encounter for general adult medical examination without abnormal findings: Secondary | ICD-10-CM

## 2022-05-09 NOTE — Progress Notes (Signed)
Subjective:   [name] is a 81 y.o. male who presents for Medicare Annual/Subsequent preventive examination.  Review of Systems     Cardiac Risk Factors include: advanced age (>69men, >69 women);hypertension;male gender     Objective:    Today's Vitals   05/09/22 1312  BP: 128/60  Pulse: 63  Temp: 97.8 F (36.6 C)  TempSrc: Oral  SpO2: 96%  Weight: 156 lb (70.8 kg)  Height: 5\' 6"  (1.676 m)   Body mass index is 25.18 kg/m.     05/09/2022    1:27 PM 03/04/2022   11:20 AM 05/03/2021   12:15 PM 03/11/2021    5:48 PM 02/22/2021    5:55 PM 12/24/2020    6:28 PM 09/14/2020    2:30 PM  Advanced Directives  Does Patient Have a Medical Advance Directive? Yes No Yes Yes No Yes No  Type of 13/12/2019 of [place];Living will  Healthcare Power of [place];Living will Healthcare Power of [place];Living will  Out of facility DNR (pink MOST or yellow form);Living will;Healthcare Power of Attorney   Does patient want to make changes to medical advance directive?    No - Patient declined     Copy of Healthcare Power of Attorney in Chart? No - copy requested  No - copy requested      Would patient like information on creating a medical advance directive?  No - Patient declined     No - Patient declined    Current Medications (verified) Outpatient Encounter Medications as of 05/09/2022  Medication Sig   albuterol (VENTOLIN HFA) 108 (90 Base) MCG/ACT inhaler Inhale 2 puffs into the lungs 4 (four) times daily as needed for wheezing or shortness of breath.   apixaban (ELIQUIS) 5 MG TABS tablet Take 1 tablet (5 mg total) by mouth 2 (two) times daily.   atorvastatin (LIPITOR) 40 MG tablet Take 1 tablet (40 mg total) by mouth daily.   cholecalciferol (VITAMIN D) 25 MCG (1000 UNIT) tablet Take 1,000 Units by mouth at bedtime.   Ferrous Sulfate (IRON) 325 (65 Fe) MG TABS Take 1 tablet by mouth at bedtime.   Fluticasone Furoate (ARNUITY ELLIPTA) 200 MCG/ACT AEPB Inhale 1  puff into the lungs every morning.   Magnesium 250 MG TABS Take 250 mg by mouth every morning.   Multiple Vitamin (MULTIVITAMIN WITH MINERALS) TABS tablet Take 1 tablet by mouth every morning.   Omega-3 Fatty Acids (FISH OIL PO) Take 1 capsule by mouth every morning.   omeprazole (PRILOSEC) 40 MG capsule Take 1 capsule (40 mg total) by mouth daily before supper.   acetaminophen (TYLENOL) 325 MG tablet Take 2 tablets (650 mg total) by mouth every 6 (six) hours as needed.   lidocaine (LIDODERM) 5 % Place 1 patch onto the skin daily as needed. Remove & Discard patch within 12 hours or as directed by MD   No facility-administered encounter medications on file as of 05/09/2022.    Allergies (verified) Patient has no known allergies.   History: Past Medical History:  Diagnosis Date   Carotid artery occlusion    GERD (gastroesophageal reflux disease)    Hypertension    Stroke (HCC) 02/2021   TIA   Past Surgical History:  Procedure Laterality Date   CATARACT EXTRACTION, BILATERAL     ENDARTERECTOMY Left 09/16/2020   Procedure: LEFT ENDARTERECTOMY CAROTID;  Surgeon: 13/02/2020, MD;  [facility]: [facility] OR;  Service: Vascular;  Laterality: Left;   HERNIA REPAIR  PACEMAKER IMPLANT     TONSILLECTOMY     Family History  Family history unknown: Yes   Social History   Socioeconomic History   Marital status: Married    Spouse name: Garment/textile technologist   Number of children: Not on file   Years of education: Not on file   Highest education level: Not on file  Occupational History   Not on file  Tobacco Use   Smoking status: Former   Smokeless tobacco: Never  Vaping Use   Vaping Use: Never used  Substance and Sexual Activity   Alcohol use: Yes    Comment: occassional   Drug use: Never   Sexual activity: Not Currently  Other Topics Concern   Not on file  Social History Narrative   Lives with wife   Social Determinants of Health   Financial Resource Strain: Low Risk  (05/09/2022)    Overall Financial Resource Strain (CARDIA)    Difficulty of Paying Living Expenses: Not hard at all  Food Insecurity: No Food Insecurity (05/09/2022)   Hunger Vital Sign    Worried About Running Out of Food in the Last Year: Never true    Ran Out of Food in the Last Year: Never true  Transportation Needs: No Transportation Needs (05/09/2022)   PRAPARE - Administrator, Civil Service (Medical): No    Lack of Transportation (Non-Medical): No  Physical Activity: Sufficiently Active (05/09/2022)   Exercise Vital Sign    Days of Exercise per Week: 5 days    Minutes of Exercise per Session: 30 min  Stress: No Stress Concern Present (05/09/2022)   [name] of Occupational Health - Occupational Stress Questionnaire    Feeling of Stress : Not at all  Social Connections: Moderately Integrated (05/03/2021)   Social Connection and Isolation Panel [NHANES]    Frequency of Communication with Friends and Family: More than three times a week    Frequency of Social Gatherings with Friends and Family: More than three times a week    Attends Religious Services: 1 to 4 times per year    Active Member of [employer] or Organizations: No    Attends Engineer, structural: Never    Marital Status: Married    Tobacco Counseling Counseling given: Not Answered   Clinical Intake:  Pre-visit preparation completed: Yes  Pain : No/denies pain     Nutritional Status: BMI 25 -29 Overweight Nutritional Risks: None Diabetes: No  How often do you need to have someone help you when you read instructions, pamphlets, or other written materials from your doctor or pharmacy?: 1 - Never What is the last grade level you completed in school?: 28yrs college  Diabetic? no  Interpreter Needed?: No  Information entered by :: NAllen LPN   Activities of Daily Living    05/09/2022    1:32 PM  In your present state of health, do you have any difficulty performing the following activities:   Hearing? 1  Comment has hearing aids  Vision? 0  Difficulty concentrating or making decisions? 0  Walking or climbing stairs? 1  Dressing or bathing? 0  Doing errands, shopping? 1  Preparing Food and eating ? N  Using the Toilet? N  In the past six months, have you accidently leaked urine? Y  Do you have problems with loss of bowel control? N  Managing your Medications? N  Managing your Finances? N  Housekeeping or managing your Housekeeping? N    Patient Care Team: [place], Betty G,  MD as PCP - General (Family Medicine) Lanier Prude, MD as PCP - Electrophysiology (Cardiology)  Indicate any recent Medical Services you may have received from other than [facility] providers in the past year (date may be approximate).     Assessment:   This is a routine wellness examination for [place].  Hearing/Vision screen Vision Screening - Comments:: Regular eye exams, [facility]  Dietary issues and exercise activities discussed: Current Exercise Habits: Home exercise routine, Type of exercise: walking, Time (Minutes): 30, Frequency (Times/Week): 5, Weekly Exercise (Minutes/Week): 150   Goals Addressed             This Visit's Progress    Patient Stated       05/09/2022, wants to get stronger and walking as much as he can       Depression Screen    05/09/2022    1:32 PM 05/03/2021   12:13 PM 10/21/2020   11:57 AM  PHQ 2/9 Scores  PHQ - 2 Score 0 0 0    Fall Risk    05/09/2022    1:29 PM 06/01/2021   10:59 AM 05/03/2021   12:17 PM  Fall Risk   Falls in the past year? 1 1 1   Comment tripped on flooring    Number falls in past yr: 1 0 1  Injury with Fall? 1 0 1  Comment needed sutures over eye  left arm  Risk for fall due to : History of fall(s);Impaired balance/gait;Impaired mobility;Medication side effect  Impaired vision;Impaired balance/gait  Follow up Falls evaluation completed;Education provided;Falls prevention discussed  Falls prevention discussed    FALL  RISK PREVENTION PERTAINING TO THE HOME:  Any stairs in or around the home? Yes  If so, are there any without handrails? No  Home free of loose throw rugs in walkways, pet beds, electrical cords, etc? Yes  Adequate lighting in your home to reduce risk of falls? Yes   ASSISTIVE DEVICES UTILIZED TO PREVENT FALLS:  Life alert? No  Use of a cane, walker or w/c? Yes  Grab bars in the bathroom? Yes  Shower chair or bench in shower? Yes  Elevated toilet seat or a handicapped toilet? No   TIMED UP AND GO:  Was the test performed? No .    Gait slow and steady with assistive device  Cognitive Function:    03/27/2022   11:01 AM  MMSE - Mini Mental State Exam  Orientation to time 5  Orientation to Place 5  Registration 3  Attention/ Calculation 3  Recall 3  Language- name 2 objects 2  Language- repeat 1  Language- follow 3 step command 3  Language- read & follow direction 1  Write a sentence 1  Copy design 1  Total score 28        05/09/2022    1:35 PM 05/03/2021   12:21 PM  6CIT Screen  What Year? 0 points 0 points  What month? 0 points 0 points  What time? 0 points 3 points  Count back from 20 0 points 0 points  Months in reverse 0 points 0 points  Repeat phrase 2 points 0 points  Total Score 2 points 3 points    Immunizations Immunization History  Administered Date(s) Administered   Influenza Split 08/13/2018   Influenza-Unspecified 08/22/2021   Moderna Sars-Covid-2 Vaccination 08/22/2021   PFIZER(Purple Top)SARS-COV-2 Vaccination 11/26/2019, 12/17/2019, 08/19/2020, 03/31/2021   Pneumococcal Conjugate-13 09/07/2016   Pneumococcal Polysaccharide-23 11/10/2015   Tdap 12/21/2017, 03/03/2022   Zoster Recombinat (  Shingrix) 12/14/2017, 05/14/2018    TDAP status: Up to date  Flu Vaccine status: Up to date  Pneumococcal vaccine status: Up to date  Covid-19 vaccine status: Completed vaccines  Qualifies for Shingles Vaccine? Yes   Zostavax completed Yes    Shingrix Completed?: Yes  Screening Tests Health Maintenance  Topic Date Due   COVID-19 Vaccine (6 - Pfizer series) 10/17/2021   INFLUENZA VACCINE  06/13/2022   TETANUS/TDAP  03/03/2032   Pneumonia Vaccine 74+ Years old  Completed   Zoster Vaccines- Shingrix  Completed   HPV VACCINES  Aged Out    Health Maintenance  Health Maintenance Due  Topic Date Due   COVID-19 Vaccine (6 - Pfizer series) 10/17/2021    Colorectal cancer screening: No longer required.   Lung Cancer Screening: (Low Dose CT Chest recommended if Age 58-80 years, 30 pack-year currently smoking OR have quit w/in 15years.) does not qualify.   Lung Cancer Screening Referral: no  Additional Screening:  Hepatitis C Screening: does not qualify;  Vision Screening: Recommended annual ophthalmology exams for early detection of glaucoma and other disorders of the eye. Is the patient up to date with their annual eye exam?  Yes  Who is the provider or what is the name of the office in which the patient attends annual eye exams? [facility] If pt is not established with a provider, would they like to be referred to a provider to establish care? No .   Dental Screening: Recommended annual dental exams for proper oral hygiene  Community Resource Referral / Chronic Care Management: CRR required this visit?  No   CCM required this visit?  No      Plan:     I have personally reviewed and noted the following in the patient's chart:   Medical and social history Use of alcohol, tobacco or illicit drugs  Current medications and supplements including opioid prescriptions. Patient is not currently taking opioid prescriptions. Functional ability and status Nutritional status Physical activity Advanced directives List of other physicians Hospitalizations, surgeries, and ER visits in previous 12 months Vitals Screenings to include cognitive, depression, and falls Referrals and appointments  In addition, I  have reviewed and discussed with patient certain preventive protocols, quality metrics, and best practice recommendations. A written personalized care plan for preventive services as well as general preventive health recommendations were provided to patient.     Kellie Simmering, LPN   [id number]   Nurse Notes: none

## 2022-05-11 ENCOUNTER — Ambulatory Visit (INDEPENDENT_AMBULATORY_CARE_PROVIDER_SITE_OTHER): Payer: Medicare Other | Admitting: Cardiology

## 2022-05-11 ENCOUNTER — Encounter: Payer: Self-pay | Admitting: Cardiology

## 2022-05-11 ENCOUNTER — Telehealth: Payer: Self-pay

## 2022-05-11 VITALS — BP 124/70 | HR 61 | Ht 66.0 in | Wt 156.6 lb

## 2022-05-11 DIAGNOSIS — I5032 Chronic diastolic (congestive) heart failure: Secondary | ICD-10-CM | POA: Diagnosis not present

## 2022-05-11 DIAGNOSIS — Z950 Presence of cardiac pacemaker: Secondary | ICD-10-CM

## 2022-05-11 DIAGNOSIS — I480 Paroxysmal atrial fibrillation: Secondary | ICD-10-CM | POA: Diagnosis not present

## 2022-05-11 NOTE — Patient Instructions (Addendum)
Medication Instructions:  Your physician recommends that you continue on your current medications as directed. Please refer to the Current Medication list given to you today. *If you need a refill on your cardiac medications before your next appointment, please call your pharmacy*  Lab Work: None. If you have labs (blood work) drawn today and your tests are completely normal, you will receive your results only by: MyChart Message (if you have MyChart) OR A paper copy in the mail If you have any lab test that is abnormal or we need to change your treatment, we will call you to review the results.  Testing/Procedures: None.  Follow-Up: At [facility], you and your health needs are our priority.  As part of our continuing mission to provide you with exceptional heart care, we have created designated Provider Care Teams.  These Care Teams include your primary Cardiologist (physician) and Advanced Practice Providers (APPs -  Physician Assistants and Nurse Practitioners) who all work together to provide you with the care you need, when you need it.  Your physician wants you to follow-up in: 12 months with Cameron Lambert, MD     You will receive a reminder letter in the mail two months in advance. If you don't receive a letter, please call our office to schedule the follow-up appointment.  We recommend signing up for the patient portal called "MyChart".  Sign up information is provided on this After Visit Summary.  MyChart is used to connect with patients for Virtual Visits (Telemedicine).  Patients are able to view lab/test results, encounter notes, upcoming appointments, etc.  Non-urgent messages can be sent to your provider as well.   To learn more about what you can do with MyChart, go to https://www.mychart.com.    Any Other Special Instructions Will Be Listed Below (If Applicable).         

## 2022-05-11 NOTE — Telephone Encounter (Signed)
Patient needs a f/u for recent TIA. Per [name], ok to schedule with her, have an appointment on hold for patient 7/27 @ 9:15. Patient also has an appointment with Dr Pearlean Brownie that needs to be r/s in September.   LVM for pt asking for call back

## 2022-05-11 NOTE — Progress Notes (Signed)
Electrophysiology Office Follow up Visit Note:    Date:  05/11/2022   ID:  WA RUMPF, DOB May 14, 1941, MRN [id number]  PCP:  [place], Betty G, MD  [facility] Cardiologist:  None  [facility] Electrophysiologist:  Lanier Prude, MD    Interval History:    [name] is a 81 y.o. male who presents for a follow up visit of their [employer] PPM. They were last seen in clinic 05/06/2021.  Since their last appointment, they have messaged the office with concerns for occasional spiking blood pressures greater than 150 systolic. His blood pressure logs sent via Mychart were personally reviewed. He was advised to continue his current regimen and keep his scheduled follow-up appointment.  On 03/03/2022 he presented to the ED after a mechanical fall. He returned the following day with concerns for an episode of confusion and difficulty speaking for 30-45 minutes. No acute CVA noted on stroke protocol. Discharged in stable condition. He had another episode of difficulty speaking on 05/03/2022 for about 10-15 minutes prompting an ED visit.  Today, he is accompanied by a family member. They report he had his 6th TIA a few weeks ago. This seemed to be his least severe TIA with no physical symptoms.  During in clinic device interrogation his heart rate was slowed; he confirmed cardiac awareness of this.  They continue to work on dietary changes to help manage his blood pressure. He reports his blood pressure has been in the 120's systolic consistently at other clinic visits.   They deny any palpitations, chest pain, shortness of breath, or peripheral edema. No lightheadedness, headaches, syncope, orthopnea, or PND.     Past Medical History:  Diagnosis Date   Carotid artery occlusion    GERD (gastroesophageal reflux disease)    Hypertension    Stroke (HCC) 02/2021   TIA    Past Surgical History:  Procedure Laterality Date   CATARACT EXTRACTION, BILATERAL     ENDARTERECTOMY Left  09/16/2020   Procedure: LEFT ENDARTERECTOMY CAROTID;  Surgeon: Maeola Harman, MD;  Location: [facility];  Service: Vascular;  Laterality: Left;   HERNIA REPAIR     PACEMAKER IMPLANT     TONSILLECTOMY      Current Medications: Current Meds  Medication Sig   albuterol (VENTOLIN HFA) 108 (90 Base) MCG/ACT inhaler Inhale 2 puffs into the lungs 4 (four) times daily as needed for wheezing or shortness of breath.   apixaban (ELIQUIS) 5 MG TABS tablet Take 1 tablet (5 mg total) by mouth 2 (two) times daily.   atorvastatin (LIPITOR) 40 MG tablet Take 1 tablet (40 mg total) by mouth daily.   cholecalciferol (VITAMIN D) 25 MCG (1000 UNIT) tablet Take 1,000 Units by mouth at bedtime.   Ferrous Sulfate (IRON) 325 (65 Fe) MG TABS Take 1 tablet by mouth at bedtime.   Fluticasone Furoate (ARNUITY ELLIPTA) 200 MCG/ACT AEPB Inhale 1 puff into the lungs every morning.   Magnesium 250 MG TABS Take 250 mg by mouth every morning.   Multiple Vitamin (MULTIVITAMIN WITH MINERALS) TABS tablet Take 1 tablet by mouth every morning.   Omega-3 Fatty Acids (FISH OIL PO) Take 1 capsule by mouth every morning.   omeprazole (PRILOSEC) 40 MG capsule Take 1 capsule (40 mg total) by mouth daily before supper.     Allergies:   Patient has no known allergies.   Social History   Socioeconomic History   Marital status: Married    Spouse name: [name]  Number of children: Not on file   Years of education: Not on file   Highest education level: Not on file  Occupational History   Not on file  Tobacco Use   Smoking status: Former   Smokeless tobacco: Never  Vaping Use   Vaping Use: Never used  Substance and Sexual Activity   Alcohol use: Yes    Comment: occassional   Drug use: Never   Sexual activity: Not Currently  Other Topics Concern   Not on file  Social History Narrative   Lives with wife   Social Determinants of Health   Financial Resource Strain: Low Risk  (05/09/2022)   Overall Financial Resource  Strain (CARDIA)    Difficulty of Paying Living Expenses: Not hard at all  Food Insecurity: No Food Insecurity (05/09/2022)   Hunger Vital Sign    Worried About Running Out of Food in the Last Year: Never true    Ran Out of Food in the Last Year: Never true  Transportation Needs: No Transportation Needs (05/09/2022)   PRAPARE - Administrator, Civil Service (Medical): No    Lack of Transportation (Non-Medical): No  Physical Activity: Sufficiently Active (05/09/2022)   Exercise Vital Sign    Days of Exercise per Week: 5 days    Minutes of Exercise per Session: 30 min  Stress: No Stress Concern Present (05/09/2022)   [name] of Occupational Health - Occupational Stress Questionnaire    Feeling of Stress : Not at all  Social Connections: Moderately Integrated (05/03/2021)   Social Connection and Isolation Panel [NHANES]    Frequency of Communication with Friends and Family: More than three times a week    Frequency of Social Gatherings with Friends and Family: More than three times a week    Attends Religious Services: 1 to 4 times per year    Active Member of [employer] or Organizations: No    Attends Banker Meetings: Never    Marital Status: Married     Family History: The patient's Family history is unknown by patient.  ROS:   Please see the history of present illness.    All other systems reviewed and are negative.  EKGs/Labs/Other Studies Reviewed:    The following studies were reviewed today:  In clinic device interrogation personally reviewed: Battery longevity 4 years Lead parameters stable Atrial pacing 96% Ventricular pacing 5.9% Mode switch less than 1% No changes made  03/13/2021 Echocardiogram:  1. Left ventricular ejection fraction, by estimation, is 60 to 65%. The  left ventricle has normal function. The left ventricle has no regional  wall motion abnormalities. There is moderate concentric left ventricular  hypertrophy. Left  ventricular  diastolic parameters are indeterminate.   2. Right ventricular systolic function is normal. The right ventricular  size is normal. There is normal pulmonary artery systolic pressure. The  estimated right ventricular systolic pressure is 31.9 mmHg.   3. The mitral valve is normal in structure. Trivial mitral valve  regurgitation. No evidence of mitral stenosis.   4. The aortic valve is normal in structure. Aortic valve regurgitation is  mild. No aortic stenosis is present.   5. The inferior vena cava is normal in size with greater than 50%  respiratory variability, suggesting right atrial pressure of 3 mmHg.   EKG:  EKG is personally reviewed.  05/11/2022:  EKG was not ordered.   Recent Labs: 05/03/2022: ALT 30; BUN 17; Creatinine, Ser 1.01; Hemoglobin 13.5; Platelets 185; Potassium 3.9; Sodium 136  Recent Lipid Panel    Component Value Date/Time   CHOL 78 03/12/2021 0242   TRIG 68 03/12/2021 0242   HDL 33 (L) 03/12/2021 0242   CHOLHDL 2.4 03/12/2021 0242   VLDL 14 03/12/2021 0242   LDLCALC 31 03/12/2021 0242    Physical Exam:    VS:  BP 124/70   Pulse 61   Ht 5\' 6"  (1.676 m)   Wt 156 lb 9.6 oz (71 kg)   SpO2 93%   BMI 25.28 kg/m     Wt Readings from Last 3 Encounters:  05/11/22 156 lb 9.6 oz (71 kg)  05/09/22 156 lb (70.8 kg)  03/27/22 161 lb (73 kg)     GEN: Well nourished, well developed in no acute distress HEENT: Normal NECK: No JVD; No carotid bruits LYMPHATICS: No lymphadenopathy CARDIAC: RRR, no murmurs, rubs, gallops; Device pocket well healed. RESPIRATORY:  Clear to auscultation without rales, wheezing or rhonchi  ABDOMEN: Soft, non-tender, non-distended MUSCULOSKELETAL:  No edema; No deformity  SKIN: Warm and dry NEUROLOGIC:  Alert and oriented x 3 PSYCHIATRIC:  Normal affect        ASSESSMENT:    1. PAF (paroxysmal atrial fibrillation) (HCC)   2. Chronic heart failure with preserved ejection fraction (HCC)   3. Presence of  permanent cardiac pacemaker    PLAN:    In order of problems listed above:   #Paroxysmal atrial fibrillation Low burden, less than 1% on device interrogation Continue Eliquis for stroke prophylaxis  #Chronic diastolic heart failure NYHA class I-II.  Warm and dry on exam.  Continue current medical therapy.  #Permanent pacemaker in situ Atrial pacing 96% Ventricular pacing 5.9% Device functioning appropriately.  Continue remote monitoring.    Follow-up in 1 year.   Medication Adjustments/Labs and Tests Ordered: Current medicines are reviewed at length with the patient today.  Concerns regarding medicines are outlined above.  No orders of the defined types were placed in this encounter.  No orders of the defined types were placed in this encounter.   I,Mathew Stumpf,acting as a 03/29/22 for Neurosurgeon, MD.,have documented all relevant documentation on the behalf of Lanier Prude, MD,as directed by  Lanier Prude, MD while in the presence of Lanier Prude, MD.  I, Lanier Prude, MD, have reviewed all documentation for this visit. The documentation on 05/11/22 for the exam, diagnosis, procedures, and orders are all accurate and complete.   Signed, 05/13/22, MD, [facility] 05/11/2022 3:41 PM    Electrophysiology [facility]

## 2022-05-22 ENCOUNTER — Encounter: Payer: Self-pay | Admitting: Adult Health

## 2022-05-22 ENCOUNTER — Ambulatory Visit (INDEPENDENT_AMBULATORY_CARE_PROVIDER_SITE_OTHER): Payer: Medicare Other | Admitting: Adult Health

## 2022-05-22 ENCOUNTER — Telehealth: Payer: Self-pay | Admitting: Adult Health

## 2022-05-22 VITALS — BP 143/65 | HR 63 | Ht 70.0 in | Wt 154.4 lb

## 2022-05-22 DIAGNOSIS — Z9889 Other specified postprocedural states: Secondary | ICD-10-CM

## 2022-05-22 DIAGNOSIS — R479 Unspecified speech disturbances: Secondary | ICD-10-CM

## 2022-05-22 NOTE — Telephone Encounter (Signed)
Use of aspirin was advised by vascular surgery post CEA. Would recommend he discuss ongoing use of aspirin with their office.

## 2022-05-22 NOTE — Telephone Encounter (Signed)
You do want him to continue both, correct?

## 2022-05-22 NOTE — Telephone Encounter (Signed)
Contacted pt both cell and home, no answer.

## 2022-05-22 NOTE — Progress Notes (Signed)
[facility] [address] [place]. [place] [address] [phone]       OFFICE FOLLOW UP NOTE  [name] Date of Birth:  1941/02/10 Medical Record Number:  [id number]   Reason for visit: Hospital follow-up    SUBJECTIVE:   CHIEF COMPLAINT:  Chief Complaint  Patient presents with   Follow-up    Pt reports feeling okay. Wife would like to discuss what is causing the TIAs. Room 2 with wife       HPI:   Update 05/22/2022 JM: Patient returns after prior visit 2 months ago as he had any additional transient episode of difficulty speaking on 6/21 for which she was evaluated in the ED.  Episode lasted 10 to 15 minutes, denied associated weakness and no other associated symptoms. [place] completed which was unremarkable, unable to have MRI due to pacer. As similar prior episodes with extensive work-up and max medical therapy on Eliquis, asa and atorvastatin, did not feel further work-up warranted.   Reports during recent episode, symptoms mainly consisted of aphasia but did have some mild confusion and dizziness. Denies any weakness during event or difficulty ambulating. He remembers event completely. After aphasia resolved, no residual confusion or other postictal symptoms. Wife reports checking blood pressure prior to going to ED, she believes it was around 124/68. Routinely monitored (at night) and typically 120-140s/60s. He has not had any additional events since that time. Reports compliance on eliquis and atorvastatin, denies side effects.  After visit, patient questioned ongoing use of aspirin as his PCP told him to stop. This was initially started post CEA by [facility]. No further concerns at this time.         History provided for reference purposes only Update 03/27/2022 JM: Patient returns for recent hospital follow-up.  He is accompanied by his wife.  He presented to ED on 4/22 with altered mental status. Per ED note, spouse reported episode of confusion,  difficulty speaking and difficulty holding his body upright which lasted approximately 30 to 45 minutes.  Upon arrival to ED, returned back to baseline. Of note, was seen the day prior, 4/21, after a mechanical fall with CTH no acute finding and all other work up unremarkable.  Did require laceration repair of left eyebrow. 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 MRI unable to be completed d/t noncompatible pacer.  Presenting episode similar to prior episodes for which he was dx'd with TIA.  Evaluated by Dr. Rory Percy who noted unclear if episode consistent with TIA vs speech arrest with some amount of cognitive decline.  Advised continued use of Eliquis and atorvastatin and recommended outpatient follow-up.  He has since been stable since that time without any reoccurring episodes or new stroke/TIA symptoms.  He has not had any additional falls, ambulates long distance and outdoors with RW for some continued imbalance and use of cane indoors/short distance. Plans on starting PT this week.  He denies any significant memory loss or cognitive issues, admits to mild age-related memory loss which wife agrees with although he initially discusses his fall for which he was being seen for today.  His wife reminded him about discussing his TIA but he had difficulty recalling this event.  Able to maintain ADLs independently.  He no longer drives.  Denies any family history of Alzheimer's dementia.  Denies any headache  associated with current episode nor any history of migraine headaches. Does report mother had bad migraine headaches.  No family history of seizures.  Per wife, the morning of ED visit on 4/22, she attempted to help him to stand to go to the bathroom but had difficulty standing.  Eventually able to stand and get to the bathroom.   Once out of the bathroom, he started to feel dizzy where wife had to sit him on the couch then had difficulty responding to wife.  He did not lose consciousness but wife would have to repeat a question multiple times before he would answer but he would only answer with short/simple speech. Once EMS arrived, symptoms gradually improving and completely resolved upon ED presentation. Wife unsure of BP at that time, believes this was obtained by EMS.  Blood pressure routinely monitored at home around 1130pm and typically 130-140s/60-70s.  Blood pressure today 124/60.  Wife also mentions LE swelling which has been present since his fall. Attempted use of compression stockings but difficulty getting them on and off. Trying to watch diet and avoiding high sodium foods but as meals are prepared for him (lives in assisted living facility), this can be difficult at times. Tries to keep legs elevated as able.  He did previously complete sleep study in 09/2021 which did not show any significant sleep apnea.    Routinely follows with PCP, cardiology and vascular surgery.   No further concerns at this time.     CT HEAD 02/2022 IMPRESSION: No evidence of acute intracranial abnormality.  ASPECTS is 10.   Moderate chronic small ischemic changes within the cerebral white matter, stable from the prior head CT of 03/03/2022.   Mild generalized parenchymal atrophy.   Redemonstrated large left periorbital/facial hematoma.   CTA HEAD/NECK 02/2022 CTA neck: 1. The common carotid and internal carotid arteries are patent within the neck without hemodynamically significant stenosis. Atherosclerotic plaque on the right, as described. Redemonstrated sequela of prior left carotid endarterectomy. Unchanged appearance of a possible focal dissection flap within the proximal left external carotid artery (with mild luminal narrowing at this site). 2. Vertebral arteries patent within the neck.  Unchanged mild-to-moderate atherosclerotic narrowing at the origin of the dominant left vertebral artery. 3. Redemonstrated enlarged and heterogeneous thyroid gland. Given the patient's advanced age, a nonemergent thyroid ultrasound may be obtained if clinically appropriate.   CTA head: 1. No emergent large vessel occlusion identified. 2. Calcified plaque within the intracranial internal carotid arteries and V4 left vertebral artery with no more than mild stenosis.    Update 09/07/2021 JM: Returns for 77-month stroke follow-up accompanied by his wife.  Doing well since prior visit.  No reoccurring or new stroke/TIA symptoms since prior visit.  Denies any recent falls.  Compliant on aspirin, Eliquis and atorvastatin -denies side effects.  Routinely followed by PCP, cardiology and vascular surgery.  Evaluated by Dr. Rexene Alberts 08/04/2021 and scheduled for sleep study on 11/16. No new concerns at this time.   Update 06/01/2021 JM: [name] returns for follow-up visit regarding recurrent TIAs.  Since he was last seen 5 months ago, he presented to ED on 03/11/2021 for likely recrudescence of expressive aphasia in setting of hypotension (88/51) and AKI (per ED note).  Imaging and work-up unremarkable.  He has not had any additional events since that time.  Reports compliance on Eliquis, aspirin 81 mg daily and atorvastatin 40 mg daily without associated side effects.  Blood pressure today 124/66. Routinely monitors at home and has  been stable. Has follow up with Dr. Donzetta Matters on 9/1 for carotid duplex. He is interested in pursing sleep evaluation at this time. No further concerns at this time.   Update 01/06/2021 JM: [name] is being seen for recent TIA hospital follow-up.  He presented to [facility] ED on 12/24/2020 with notes, lab work and imaging personally reviewed with reported dizziness and lightheadedness and difficulty speaking after a fall.  He has had prior TIAs with speech difficulty with this event similar to  prior episodes but not as bad and symptoms completely resolved.  Evaluated by Dr. [name] with CT head negative and CTA L carotid endarterectomy patent without significant stenosis.  MRI unable to be obtained due to pacer.  LDL 34.  A1c 5.6.  It was felt as though his symptoms possibly more related to syncopal event vs TIA although he does have stroke risk factors including atrial flutter on Eliquis, HLD on atorvastatin, HTN and prediabetes.  Since that time, he has not had any additional or new stroke/TIA symptoms.   Reports compliance on Eliquis 5 mg twice daily, aspirin 81 mg daily and atorvastatin 40 mg daily -denies side effects Blood pressure today 142/72 - monitors at home and typically stable  No new concerns at this time  Initial visit 10/21/2020 JM: Mr. Ziman is being seen for hospital follow-up accompanied by his wife.  He has been doing well since discharge without new or reoccurring stroke/TIA symptoms.  Remains on aspirin and Eliquis for secondary stroke prevention with known AF and s/p L CEA without bleeding or bruising.  Remains on atorvastatin 40 mg daily without myalgias.  Blood pressure today satisfactory 136/61.  Wife concerned regarding possible apnea with sleep study 3 years ago in [place], [place] diagnosed with mild sleep apnea per patient (unable to view via epic) and no treatment indicated.  Since that time, increased nocturia q2-3hrs, witnessed apnea and snoring.  Use of some type of mouthguard and head elevation while sleeping which has decreased snoring and apnea. No further concerns.   Stroke admission 09/11/2020 [name] is a 81 y.o. male who recently moved to [facility] from [place] with a PMHx of Htn, GERD, ? atrial fibrillation, bradycardia s/p pacemaker in 2016.  Presented on 09/11/2020 with word finding difficulty and headache.  Personally reviewed pertinent hospitalization progress notes, lab work and imaging with summary provided.  Evaluated by Dr. Erlinda Hong with likely  TIA secondary to AF not on AC vs soft plaque at left ICA bulb.  MRI unable to be obtained d/t noncompatible pacer.  Pacer interrogation +SVT and short run of a flutter.  Questionable history of A. fib which was confirmed by prior cardiologist but low burden therefore AC not started.  In setting of recent TIA, initiated Eliquis for secondary stroke prevention. Hx of HTN stable and resumed home dose metoprolol. Hx of HLD on pravastatin with LDL 58.  Other stroke risk factors include advanced age, EtOH use and family history of strokes but no personal prior history of strokes.  Other active problems include anemia, aortic arthrosclerosis, bradycardia s/p pacer and GERD.  Evaluated by therapies without additional therapy needs and discharged home in stable condition on 09/14/2020.  He returned shortly after discharge on 09/14/2020 with recurrence of speech and language difficulty.  CTA head/neck repeated negative LVO or worsening stenosis with stable fibrofatty plaque of left ICA origin.  Vascular surgery consulted and underwent left CEA without complication.  Initiated aspirin in addition to Eliquis at discharge.  Symptoms resolved and  discharged home without therapy needs on 09/17/2020.    09/11/2020 TIA secondary to AF not on AC vs. Soft plaque at left ICA bulb CT head - no acute abnormality MRI head - not able to do due to pacer not compatible with MRI CTA H&N - No emergent large vessel occlusion or high-grade stenosis of the intracranial arteries. Eccentric plaque within the proximal left internal carotid artery may be at increased risk of embolus formation. Bilateral carotid bifurcation atherosclerosis without hemodynamically significant stenosis by NASCET criteria.  CT repeat Unremarkable  2D Echo - EF 55-60%. No source of embolus  Pacemaker interrogation +SVT, short run of Aflutter per Dr. 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 ASPECTS 10 CTA H&N repeat - stable. No LVO or significant stenosis. Irregular L ICA plaque at origin, unchanged.  S/p L CEA for high risk soft plaque left ICA LDL - 58 -initiated atorvastatin 40 mg daily HgbA1c - 5.4 UDS - negative VTE prophylaxis - Eliquis aspirin 81 mg daily prior to admission, on ASA 81mg  post left CEA. Will be discharged with ASA 81 and eliquis 5mg  bid.      ROS:   14 system review of systems performed and negative with exception of those listed in HPI  PMH:  Past Medical History:  Diagnosis Date   Carotid artery occlusion    GERD (gastroesophageal reflux disease)    Hypertension    Stroke (HCC) 02/2021   TIA    PSH:  Past Surgical History:  Procedure Laterality Date   CATARACT EXTRACTION, BILATERAL     ENDARTERECTOMY Left 09/16/2020   Procedure: LEFT ENDARTERECTOMY CAROTID;  Surgeon: 03/2021, MD;  Location: [facility] OR;  Service: Vascular;  Laterality: Left;   HERNIA REPAIR     PACEMAKER IMPLANT     TONSILLECTOMY      Social History:  Social History   Socioeconomic History   Marital status: Married    Spouse name: Maeola Harman   Number of children: Not on file   Years of education: Not on file   Highest education level: Not on file  Occupational History   Not on file  Tobacco Use   Smoking status: Former   Smokeless tobacco: Never  Vaping Use   Vaping Use: Never used  Substance and Sexual Activity   Alcohol use: Yes    Comment: occassional   Drug use: Never   Sexual activity: Not Currently  Other Topics Concern   Not on file  Social History Narrative   Lives with wife   Social Determinants of Health   Financial Resource Strain: Low Risk  (05/09/2022)    Overall Financial Resource Strain (CARDIA)    Difficulty of Paying Living Expenses: Not hard at all  Food Insecurity: No Food Insecurity (05/09/2022)   Hunger Vital Sign    Worried About Running Out of Food in the Last Year: Never true    Ran Out of Food in the Last Year: Never true  Transportation Needs: No Transportation Needs (05/09/2022)   PRAPARE - 05/11/2022 (Medical): No    Lack of Transportation (  Non-Medical): No  Physical Activity: Sufficiently Active (05/09/2022)   Exercise Vital Sign    Days of Exercise per Week: 5 days    Minutes of Exercise per Session: 30 min  Stress: No Stress Concern Present (05/09/2022)   [name] of Occupational Health - Occupational Stress Questionnaire    Feeling of Stress : Not at all  Social Connections: Moderately Integrated (05/03/2021)   Social Connection and Isolation Panel [NHANES]    Frequency of Communication with Friends and Family: More than three times a week    Frequency of Social Gatherings with Friends and Family: More than three times a week    Attends Religious Services: 1 to 4 times per year    Active Member of [employer] or Organizations: No    Attends Banker Meetings: Never    Marital Status: Married  Catering manager Violence: Not At Risk (05/03/2021)   Humiliation, Afraid, Rape, and Kick questionnaire    Fear of Current or Ex-Partner: No    Emotionally Abused: No    Physically Abused: No    Sexually Abused: No    Family History:  Family History  Family history unknown: Yes    Medications:   Current Outpatient Medications on File Prior to Visit  Medication Sig Dispense Refill   acetaminophen (TYLENOL) 325 MG tablet Take 2 tablets (650 mg total) by mouth every 6 (six) hours as needed. 36 tablet 0   albuterol (VENTOLIN HFA) 108 (90 Base) MCG/ACT inhaler Inhale 2 puffs into the lungs 4 (four) times daily as needed for wheezing or shortness of breath. 8 g 1   apixaban (ELIQUIS) 5  MG TABS tablet Take 1 tablet (5 mg total) by mouth 2 (two) times daily. 180 tablet 1   atorvastatin (LIPITOR) 40 MG tablet Take 1 tablet (40 mg total) by mouth daily. 90 tablet 3   cholecalciferol (VITAMIN D) 25 MCG (1000 UNIT) tablet Take 1,000 Units by mouth at bedtime.     Ferrous Sulfate (IRON) 325 (65 Fe) MG TABS Take 1 tablet by mouth at bedtime.     Fluticasone Furoate (ARNUITY ELLIPTA) 200 MCG/ACT AEPB Inhale 1 puff into the lungs every morning. 90 each 3   Magnesium 250 MG TABS Take 250 mg by mouth every morning.     Multiple Vitamin (MULTIVITAMIN WITH MINERALS) TABS tablet Take 1 tablet by mouth every morning.     Omega-3 Fatty Acids (FISH OIL PO) Take 1 capsule by mouth every morning.     omeprazole (PRILOSEC) 40 MG capsule Take 1 capsule (40 mg total) by mouth daily before supper. 90 capsule 2   No current facility-administered medications on file prior to visit.    Allergies:  No Known Allergies    OBJECTIVE:  Physical Exam  Vitals:   05/22/22 1007  BP: (!) 143/65  Pulse: 63  Weight: 154 lb 6 oz (70 kg)  Height: 5\' 10"  (1.778 m)      Body mass index is 22.15 kg/m. No results found.  General: well developed, well nourished,  very pleasant elderly Caucasian male, seated, in no evident distress Head: head normocephalic and atraumatic.   Neck: supple with no carotid or supraclavicular bruits Cardiovascular: regular rate and rhythm, no murmurs Musculoskeletal: no deformity Skin:  R>L LE pitting edema with mild redness and warmth RLE compared to LLE Vascular:  Normal pulses all extremities   Neurologic Exam Mental Status: Awake and fully alert.   Fluent speech and language.  Oriented to place and time. Recent  memory mildly impaired and remote memory intact. Attention span, concentration and fund of knowledge mostly intact.  Mood and affect appropriate.  Cranial Nerves: Pupils equal, briskly reactive to light. Extraocular movements full without nystagmus. Visual  fields full to confrontation.  HOH bilaterally. Facial sensation intact. Face, tongue, palate moves normally and symmetrically.  Motor: Normal bulk and tone. Normal strength in all tested extremity muscles. Sensory.: intact to touch , pinprick , position and vibratory sensation.  Coordination: Rapid alternating movements normal in all extremities. Finger-to-nose and heel-to-shin performed accurately bilaterally. Gait and Station: Arises from chair without difficulty. Stance is hunched with scoliosis. Gait demonstrates normal stride length although decreased step height bilaterally with mild unsteadiness and use of rolling walker.  Tandem walk and heel toe not attempted. Reflexes: 1+ and symmetric. Toes downgoing.         ASSESSMENT: [name] is a 81 y.o. year old male with recurrent episodes of aphasia and AMS in 08/2020 (felt in setting of TIA 2/2 A fib not on AC vs L ICA bulb soft plaque), 09/2020 (felt TIA in setting of high risk of plaque of left ICA s/p L CEA), 12/2020 (post fall possibly presyncope vs TIA), 02/2021 (likely in setting of hypotension),  03/04/2022 in setting of dizziness and difficulty standing and more recently 05/03/2022 without any associated physical symptoms. Vascular risk factors include A. fib/flutter, HTN, HLD, bradycardia s/p pacer, advanced age and hx of EtOH use.      PLAN:  Recurrent transient aphasia:  Ddx TIA vs seizure vs complicated migraine TIA: continue aspirin 81mg  daily, Eliquis and atorvastatin for secondary stroke prevention measures. Prior episodes questioned in setting of low BP - Discussed importance of avoiding low blood pressure.  Discussed secondary stroke prevention measures and importance of close PCP follow up for aggressive stroke risk factor management including BP goal<130/90 (with avoiding hypotension), HLD with LDL goal<70  Seizure: prior EEG's unremarkable. Will further discuss trialing antiseizure medication with Dr. [name] to see if  this helps resolve events especially with occurring 2 months apart.    L ICA soft plaque: s/p CEA. 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 Repeat carotid ultrasound in setting of recent event. Plans on f/u with [facility] in September. Advised to discuss ongoing use of aspirin as this was started post CEA    Followed up as scheduled in October with Dr. [name] for further input of recurrent transient events    CC:  [facility] provider: Dr. Sethi Martinique, Malka So, MD    I spent 38 minutes of face-to-face and non-face-to-face time with patient and wife.  This included previsit chart review including review of recent hospitalization, lab review, study review, order entry, electronic health record documentation, patient education regarding recurrent events possible TIAs, A. fib and indication for [facility], s/p CEA and indication for surveillance monitoring with [facility],  importance of managing stroke risk factors and answered all other questions to patient and wife's satisfaction  Frann Rider, AGNP-BC  [facility] [address] [place], [place] [address]  Phone [phone] Fax [phone] Note: This document was prepared with digital dictation and possible smart phrase technology. Any transcriptional errors that result from this process are unintentional.

## 2022-05-22 NOTE — Telephone Encounter (Signed)
Pt came to desk after his appt to inform us his AVS states to keep taking 81 mg of Aspirin a day with Eliquis.  Pt states his primary care suggested he not take to aspirin with the Eliquis and was under the impression he should not be taking these together. Would like a call to clarify.

## 2022-05-22 NOTE — Patient Instructions (Addendum)
We will check a carotid ultrasound to ensure stable appearance   Will discuss starting seizure preventative medication with Dr. Pearlean Brownie - we will keep you updated   Continue aspirin 81 mg daily and Eliquis (apixaban) daily  and atorvastatin 40mg  daily  for secondary stroke prevention  Continue to follow up with PCP regarding cholesterol and blood pressure management  Maintain strict control of hypertension with blood pressure goal below 130/90 and cholesterol with LDL cholesterol (bad cholesterol) goal below 70 mg/dL.   Signs of a Stroke? Follow the BEFAST method:  Balance Watch for a sudden loss of balance, trouble with coordination or vertigo Eyes Is there a sudden loss of vision in one or both eyes? Or double vision?  Face: Ask the person to smile. Does one side of the face droop or is it numb?  Arms: Ask the person to raise both arms. Does one arm drift downward? Is there weakness or numbness of a leg? Speech: Ask the person to repeat a simple phrase. Does the speech sound slurred/strange? Is the person confused ? Time: If you observe any of these signs, call [phone].      Please follow up with Dr. as scheduled in October for further discussion/evaluation of your recent episodes of aphasia       Thank you for coming to see November at [facility]. I hope we have been able to provide you high quality care today.  You may receive a patient satisfaction survey over the next few weeks. We would appreciate your feedback and comments so that we may continue to improve ourselves and the health of our patients.

## 2022-05-31 ENCOUNTER — Telehealth: Payer: Self-pay | Admitting: Family Medicine

## 2022-05-31 NOTE — Telephone Encounter (Signed)
VO given to [employer]

## 2022-05-31 NOTE — Telephone Encounter (Signed)
[facility] is calling and would like VO for PT 1x4

## 2022-06-02 ENCOUNTER — Telehealth: Payer: Self-pay | Admitting: Family Medicine

## 2022-06-02 NOTE — Telephone Encounter (Signed)
VO given to [facility].

## 2022-06-02 NOTE — Telephone Encounter (Signed)
Britt Boozer from [facility] called to inform MD:   Pt to be discharged & readmitted, due to some red tape and documentation purposes.  Is MD okay with that?  Please call her back at  [phone]

## 2022-06-02 NOTE — Telephone Encounter (Signed)
If it is appropriate , yes, he can be readmitted. Thanks, BJ

## 2022-06-08 ENCOUNTER — Ambulatory Visit (HOSPITAL_COMMUNITY)
Admission: RE | Admit: 2022-06-08 | Discharge: 2022-06-08 | Disposition: A | Discharge status: Home / Self Care | Payer: Medicare Other | Source: Ambulatory Visit | Attending: Adult Health | Admitting: Adult Health

## 2022-06-08 ENCOUNTER — Encounter (HOSPITAL_COMMUNITY): Payer: Self-pay

## 2022-06-08 DIAGNOSIS — Z9889 Other specified postprocedural states: Secondary | ICD-10-CM

## 2022-06-08 DIAGNOSIS — R479 Unspecified speech disturbances: Secondary | ICD-10-CM | POA: Diagnosis present

## 2022-06-08 DIAGNOSIS — R600 Localized edema: Secondary | ICD-10-CM | POA: Insufficient documentation

## 2022-06-08 NOTE — Progress Notes (Signed)
Carotid artery duplex has been completed. Preliminary results can be found in CV Proc through chart review.   06/08/22 1:47 PM Olen Cordial RVT

## 2022-06-12 ENCOUNTER — Other Ambulatory Visit: Payer: Self-pay | Admitting: Family Medicine

## 2022-06-27 ENCOUNTER — Ambulatory Visit: Payer: Medicare Other

## 2022-07-20 ENCOUNTER — Telehealth: Payer: Self-pay | Admitting: Family Medicine

## 2022-07-20 NOTE — Telephone Encounter (Signed)
Requesting refill of Fluticasone Furoate (ARNUITY ELLIPTA) 200 MCG/ACT AEPB

## 2022-07-21 MED ORDER — ARNUITY ELLIPTA 200 MCG/ACT IN AEPB: 1.0000 | INHALATION_SPRAY | Freq: Every morning | RESPIRATORY_TRACT | 3 refills | Status: DC

## 2022-07-21 NOTE — Telephone Encounter (Signed)
Rx sent in

## 2022-07-21 NOTE — Addendum Note (Signed)
Addended by: Kathreen Devoid on: 07/21/2022 07:16 AM   Modules accepted: Orders

## 2022-08-01 ENCOUNTER — Ambulatory Visit: Payer: Medicare Other | Admitting: Neurology

## 2022-08-08 ENCOUNTER — Telehealth: Payer: Self-pay | Admitting: Family Medicine

## 2022-08-08 DIAGNOSIS — I480 Paroxysmal atrial fibrillation: Secondary | ICD-10-CM

## 2022-08-08 MED ORDER — APIXABAN 5 MG PO TABS: 5.0000 mg | ORAL_TABLET | Freq: Two times a day (BID) | ORAL | 1 refills | Status: DC

## 2022-08-08 NOTE — Telephone Encounter (Signed)
Pt called to request a refill of the following:  apixaban (ELIQUIS) 5 MG TABS tablet  LOV:  03/13/22  [id number] - [place], [place] - [address] AT [address] Phone:  [phone]  Fax:  [phone]

## 2022-08-08 NOTE — Telephone Encounter (Signed)
Rx sent in

## 2022-08-15 ENCOUNTER — Ambulatory Visit (INDEPENDENT_AMBULATORY_CARE_PROVIDER_SITE_OTHER): Payer: Medicare Other | Admitting: Neurology

## 2022-08-15 ENCOUNTER — Encounter: Payer: Self-pay | Admitting: Neurology

## 2022-08-15 VITALS — BP 134/74 | HR 61 | Ht 70.0 in | Wt 156.2 lb

## 2022-08-15 DIAGNOSIS — I482 Chronic atrial fibrillation, unspecified: Secondary | ICD-10-CM

## 2022-08-15 DIAGNOSIS — G459 Transient cerebral ischemic attack, unspecified: Secondary | ICD-10-CM

## 2022-08-15 NOTE — Patient Instructions (Signed)
I had a long discussion with the patient and his wife regarding his TIA in June 2023 and chronic A-fib on anticoagulation with Eliquis and discussed risk for recurrent strokes and stroke prevention and answered questions.  Recommend he continue Eliquis for now but will discussed alternatives and lack of definitive data on switching anticoagulants.  I also discussed possibility of participating in [place] AF stroke prevention study ( Eliquis  versus Milvexian-Factor 11 inhibitor) . He expressed interest and was given written study material to review at home and decide.  He was also advised to maintain aggressive risk factor modification with strict control of hypertension with blood pressure goal below 140/90, lipids with LDL cholesterol goal below 70 mg percent and diabetes with hemoglobin A1c goal below 6.5%.  He was also advised to eat a healthy diet with lots of fruits, vegetables, fiber regularly.  Return for follow-up in the future in 1 year or call earlier if necessary.

## 2022-08-15 NOTE — Progress Notes (Signed)
[facility] [address] [place]. [place] [address] [phone]       OFFICE FOLLOW UP NOTE  [name] Date of Birth:  18-Feb-1941 Medical Record Number:  [id number]   Reason for visit: Hospital follow-up    SUBJECTIVE:   CHIEF COMPLAINT:  Chief Complaint  Patient presents with   Follow-up    Rm 20.      HPI:  Update 08/15/2022 ; he returns for follow-up after his last visit with [facility] practitioner 3 months ago.  Is accompanied by his wife.  He states he has had no more episodes of TIA or seizure difficulties or confusion last episode in June questions regarding whether the easy bruising but no bleeding episodes.  His blood pressure is well controlled at home but today it is borderline at 139/74.  Remains on Lipitor 40 mg daily which is tolerating well without muscle aches and pains.  He had a follow-up carotid ultrasound on 06/08/2022 which showed only mild 1-39% bilateral carotid disease.  No new complaints today. Update 03/27/2022 JM: Patient returns for recent hospital follow-up.  He is accompanied by his wife.  He presented to ED on 4/22 with altered mental status. Per ED note, spouse reported episode of confusion, difficulty speaking and difficulty holding his body upright which lasted approximately 30 to 45 minutes.  Upon arrival to ED, returned back to baseline. Of note, was seen the day prior, 4/21, after a mechanical fall with CTH no acute finding and all other work up unremarkable.  Did require laceration repair of left eyebrow. 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 MRI unable to be completed d/t noncompatible pacer.  Presenting episode similar to prior episodes for which he was dx'd with TIA.  Evaluated by Dr. Rory Percy who noted unclear if episode consistent  with TIA vs speech arrest with some amount of cognitive decline.  Advised continued use of Eliquis and atorvastatin and recommended outpatient follow-up.  He has since been stable since that time without any reoccurring episodes or new stroke/TIA symptoms.  He has not had any additional falls, ambulates long distance and outdoors with RW for some continued imbalance and use of cane indoors/short distance. Plans on starting PT this week.  He denies any significant memory loss or cognitive issues, admits to mild age-related memory loss which wife agrees with although he initially discusses his fall for which he was being seen for today.  His wife reminded him about discussing his TIA but he had difficulty recalling this event.  Able to maintain ADLs independently.  He no longer drives.  Denies any family history of Alzheimer's dementia.  Denies any headache associated with current episode nor any history of migraine headaches. Does report mother had bad migraine headaches.  No family history of seizures.  Per wife, the morning of ED visit on 4/22, she attempted to help him to stand to go to the bathroom but had difficulty standing.  Eventually able to stand and get to the bathroom.  Once out of the bathroom, he started to feel dizzy where wife had to sit him on the couch then had difficulty responding to wife.  He did not lose consciousness but wife would have to repeat a question multiple times before he would answer but he would only answer with short/simple speech. Once EMS arrived, symptoms  gradually improving and completely resolved upon ED presentation. Wife unsure of BP at that time, believes this was obtained by EMS.  Blood pressure routinely monitored at home around 1130pm and typically 130-140s/60-70s.  Blood pressure today 124/60.  Wife also mentions LE swelling which has been present since his fall. Attempted use of compression stockings but difficulty getting them on and off. Trying to watch diet  and avoiding high sodium foods but as meals are prepared for him (lives in assisted living facility), this can be difficult at times. Tries to keep legs elevated as able.  He did previously complete sleep study in 09/2021 which did not show any significant sleep apnea.    Routinely follows with PCP, cardiology and vascular surgery.   No further concerns at this time.     CT HEAD 02/2022 IMPRESSION: No evidence of acute intracranial abnormality.  ASPECTS is 10.   Moderate chronic small ischemic changes within the cerebral white matter, stable from the prior head CT of 03/03/2022.   Mild generalized parenchymal atrophy.   Redemonstrated large left periorbital/facial hematoma.   CTA HEAD/NECK 02/2022 CTA neck: 1. The common carotid and internal carotid arteries are patent within the neck without hemodynamically significant stenosis. Atherosclerotic plaque on the right, as described. Redemonstrated sequela of prior left carotid endarterectomy. Unchanged appearance of a possible focal dissection flap within the proximal left external carotid artery (with mild luminal narrowing at this site). 2. Vertebral arteries patent within the neck. Unchanged mild-to-moderate atherosclerotic narrowing at the origin of the dominant left vertebral artery. 3. Redemonstrated enlarged and heterogeneous thyroid gland. Given the patient's advanced age, a nonemergent thyroid ultrasound may be obtained if clinically appropriate.   CTA head: 1. No emergent large vessel occlusion identified. 2. Calcified plaque within the intracranial internal carotid arteries and V4 left vertebral artery with no more than mild stenosis.     History provided for reference purposes only Update 09/07/2021 JM: Returns for 67-month stroke follow-up accompanied by his wife.  Doing well since prior visit.  No reoccurring or new stroke/TIA symptoms since prior visit.  Denies any recent falls.  Compliant on aspirin, Eliquis  and atorvastatin -denies side effects.  Routinely followed by PCP, cardiology and vascular surgery.  Evaluated by Dr. Rexene Alberts 08/04/2021 and scheduled for sleep study on 11/16. No new concerns at this time.   Update 06/01/2021 JM: [name] returns for follow-up visit regarding recurrent TIAs.  Since he was last seen 5 months ago, he presented to ED on 03/11/2021 for likely recrudescence of expressive aphasia in setting of hypotension (88/51) and AKI (per ED note).  Imaging and work-up unremarkable.  He has not had any additional events since that time.  Reports compliance on Eliquis, aspirin 81 mg daily and atorvastatin 40 mg daily without associated side effects.  Blood pressure today 124/66. Routinely monitors at home and has been stable. Has follow up with Dr. Donzetta Matters on 9/1 for carotid duplex. He is interested in pursing sleep evaluation at this time. No further concerns at this time.   Update 01/06/2021 JM: [name] is being seen for recent TIA hospital follow-up.  He presented to [facility] ED on 12/24/2020 with notes, lab work and imaging personally reviewed with reported dizziness and lightheadedness and difficulty speaking after a fall.  He has had prior TIAs with speech difficulty with this event similar to prior episodes but not as bad and symptoms completely resolved.  Evaluated by Dr. [name] with CT head negative and CTA L carotid endarterectomy patent  without significant stenosis.  MRI unable to be obtained due to pacer.  LDL 34.  A1c 5.6.  It was felt as though his symptoms possibly more related to syncopal event vs TIA although he does have stroke risk factors including atrial flutter on Eliquis, HLD on atorvastatin, HTN and prediabetes.  Since that time, he has not had any additional or new stroke/TIA symptoms.   Reports compliance on Eliquis 5 mg twice daily, aspirin 81 mg daily and atorvastatin 40 mg daily -denies side effects Blood pressure today 142/72 - monitors at home and typically stable  No new  concerns at this time  Initial visit 10/21/2020 JM: [name] is being seen for hospital follow-up accompanied by his wife.  He has been doing well since discharge without new or reoccurring stroke/TIA symptoms.  Remains on aspirin and Eliquis for secondary stroke prevention with known AF and s/p L CEA without bleeding or bruising.  Remains on atorvastatin 40 mg daily without myalgias.  Blood pressure today satisfactory 136/61.  Wife concerned regarding possible apnea with sleep study 3 years ago in [place], [place] diagnosed with mild sleep apnea per patient (unable to view via epic) and no treatment indicated.  Since that time, increased nocturia q2-3hrs, witnessed apnea and snoring.  Use of some type of mouthguard and head elevation while sleeping which has decreased snoring and apnea. No further concerns.   Stroke admission 09/11/2020 [name] is a 81 y.o. male who recently moved to [facility] from [place] with a PMHx of Htn, GERD, ? atrial fibrillation, bradycardia s/p pacemaker in 2016.  Presented on 09/11/2020 with word finding difficulty and headache.  Personally reviewed pertinent hospitalization progress notes, lab work and imaging with summary provided.  Evaluated by Dr. Erlinda Hong with likely TIA secondary to AF not on AC vs soft plaque at left ICA bulb.  MRI unable to be obtained d/t noncompatible pacer.  Pacer interrogation +SVT and short run of a flutter.  Questionable history of A. fib which was confirmed by prior cardiologist but low burden therefore AC not started.  In setting of recent TIA, initiated Eliquis for secondary stroke prevention. Hx of HTN stable and resumed home dose metoprolol. Hx of HLD on pravastatin with LDL 58.  Other stroke risk factors include advanced age, EtOH use and family history of strokes but no personal prior history of strokes.  Other active problems include anemia, aortic arthrosclerosis, bradycardia s/p pacer and GERD.  Evaluated by therapies without additional  therapy needs and discharged home in stable condition on 09/14/2020.  He returned shortly after discharge on 09/14/2020 with recurrence of speech and language difficulty.  CTA head/neck repeated negative LVO or worsening stenosis with stable fibrofatty plaque of left ICA origin.  Vascular surgery consulted and underwent left CEA without complication.  Initiated aspirin in addition to Eliquis at discharge.  Symptoms resolved and discharged home without therapy needs on 09/17/2020.    09/11/2020 TIA secondary to AF not on AC vs. Soft plaque at left ICA bulb CT head - no acute abnormality MRI head - not able to do due to pacer not compatible with MRI CTA H&N - No emergent large vessel occlusion or high-grade stenosis of the intracranial arteries. Eccentric plaque within the proximal left internal carotid artery may be at increased risk of embolus formation. Bilateral carotid bifurcation atherosclerosis without hemodynamically significant stenosis by NASCET criteria.  CT repeat Unremarkable  2D Echo - EF 55-60%. No source of embolus  Pacemaker interrogation +SVT, short run of Aflutter  per Dr. 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 ASPECTS 10 CTA H&N repeat - stable. No LVO or significant stenosis. Irregular L ICA plaque at origin, unchanged.  S/p L CEA for high risk soft plaque left ICA LDL - 58 -initiated atorvastatin 40 mg daily HgbA1c - 5.4 UDS - negative VTE prophylaxis - Eliquis aspirin 81 mg daily prior to admission, on ASA 81mg  post left CEA. Will be discharged with ASA 81 and eliquis 5mg  bid.      ROS:   14 system review of systems performed  and negative with exception of those listed in HPI  PMH:  Past Medical History:  Diagnosis Date   Carotid artery occlusion    GERD (gastroesophageal reflux disease)    Hypertension    Stroke ([place]) 02/2021   TIA    PSH:  Past Surgical History:  Procedure Laterality Date   CATARACT EXTRACTION, BILATERAL     ENDARTERECTOMY Left 09/16/2020   Procedure: LEFT ENDARTERECTOMY CAROTID;  Surgeon: Waynetta Sandy, MD;  Location: [facility];  Service: Vascular;  Laterality: Left;   HERNIA REPAIR     PACEMAKER IMPLANT     TONSILLECTOMY      Social History:  Social History   Socioeconomic History   Marital status: Married    Spouse name: Electrical engineer   Number of children: Not on file   Years of education: Not on file   Highest education level: Not on file  Occupational History   Not on file  Tobacco Use   Smoking status: Former   Smokeless tobacco: Never  Vaping Use   Vaping Use: Never used  Substance and Sexual Activity   Alcohol use: Yes    Comment: occassional   Drug use: Never   Sexual activity: Not Currently  Other Topics Concern   Not on file  Social History Narrative   Lives with wife   Social Determinants of Health   Financial Resource Strain: Low Risk  (05/09/2022)   Overall Financial Resource Strain (CARDIA)    Difficulty of Paying Living Expenses: Not hard at all  Food Insecurity: No Food Insecurity (05/09/2022)   Hunger Vital Sign    Worried About Running Out of Food in the Last Year: Never true    Ran Out of Food in the Last Year: Never true  Transportation Needs: No Transportation Needs (05/09/2022)   PRAPARE - Hydrologist (Medical): No    Lack of Transportation (Non-Medical): No  Physical Activity: Sufficiently Active (05/09/2022)   Exercise Vital Sign    Days of Exercise per Week: 5 days    Minutes of Exercise per Session: 30 min  Stress: No Stress Concern Present (05/09/2022)   [place]    Feeling of Stress : Not at all  Social Connections: Moderately Integrated (05/03/2021)   Social Connection and Isolation Panel [NHANES]    Frequency of Communication with Friends and Family: More than three times a week    Frequency of Social Gatherings with Friends and Family: More than three times a week  Attends Religious Services: 1 to 4 times per year    Active Member of Clubs or Organizations: No    Attends Archivist Meetings: Never    Marital Status: Married  Human resources officer Violence: Not At Risk (05/03/2021)   Humiliation, Afraid, Rape, and Kick questionnaire    Fear of Current or Ex-Partner: No    Emotionally Abused: No    Physically Abused: No    Sexually Abused: No    Family History:  Family History  Family history unknown: Yes    Medications:   Current Outpatient Medications on File Prior to Visit  Medication Sig Dispense Refill   acetaminophen (TYLENOL) 325 MG tablet Take 2 tablets (650 mg total) by mouth every 6 (six) hours as needed. 36 tablet 0   albuterol (VENTOLIN HFA) 108 (90 Base) MCG/ACT inhaler Inhale 2 puffs into the lungs 4 (four) times daily as needed for wheezing or shortness of breath. 8 g 1   apixaban (ELIQUIS) 5 MG TABS tablet Take 1 tablet (5 mg total) by mouth 2 (two) times daily. 180 tablet 1   atorvastatin (LIPITOR) 40 MG tablet Take 1 tablet (40 mg total) by mouth daily. (Patient taking differently: Take 40 mg by mouth every evening.) 90 tablet 3   cholecalciferol (VITAMIN D) 25 MCG (1000 UNIT) tablet Take 1,000 Units by mouth at bedtime.     Ferrous Sulfate (IRON) 325 (65 Fe) MG TABS Take 1 tablet by mouth at bedtime.     Fluticasone Furoate (ARNUITY ELLIPTA) 200 MCG/ACT AEPB Inhale 1 puff into the lungs every morning. 90 each 3   Magnesium 250 MG TABS Take 250 mg by mouth every morning.     Multiple Vitamin (MULTIVITAMIN WITH MINERALS) TABS tablet Take 1 tablet by mouth every morning.     Omega-3  Fatty Acids (FISH OIL PO) Take 1 capsule by mouth every morning.     omeprazole (PRILOSEC) 40 MG capsule TAKE 1 CAPSULE BY MOUTH DAILY BEFORE SUPPER. 90 capsule 2   No current facility-administered medications on file prior to visit.    Allergies:  No Known Allergies    OBJECTIVE:  Physical Exam  Vitals:   08/15/22 1039  BP: 134/74  Pulse: 61  Weight: 156 lb 3.2 oz (70.9 kg)  Height: 5\' 10"  (1.778 m)     Body mass index is 22.41 kg/m. No results found.  General: well developed, well nourished,  very pleasant elderly Caucasian male, seated, in no evident distress Head: head normocephalic and atraumatic.   Neck: supple with no carotid or supraclavicular bruits Cardiovascular: regular rate and rhythm, no murmurs Musculoskeletal: no deformity Skin:  R>L LE pitting edema with mild redness and warmth RLE compared to LLE Vascular:  Normal pulses all extremities   Neurologic Exam Mental Status: Awake and fully alert.   Fluent speech and language.  Oriented to place and time. Recent memory mildly impaired and remote memory intact. Attention span, concentration and fund of knowledge mostly intact.  Mood and affect appropriate.     03/27/2022   11:01 AM  MMSE - Mini Mental State Exam  Orientation to time 5  Orientation to Place 5  Registration 3  Attention/ Calculation 3  Recall 3  Language- name 2 objects 2  Language- repeat 1  Language- follow 3 step command 3  Language- read & follow direction 1  Write a sentence 1  Copy design 1  Total score 28   Cranial Nerves: Pupils equal, briskly reactive to light. Extraocular movements  full without nystagmus. Visual fields full to confrontation.  HOH bilaterally. Facial sensation intact. Face, tongue, palate moves normally and symmetrically.  Motor: Normal bulk and tone. Normal strength in all tested extremity muscles. Sensory.: intact to touch , pinprick , position and vibratory sensation.  Coordination: Rapid alternating  movements normal in all extremities. Finger-to-nose and heel-to-shin performed accurately bilaterally. Gait and Station: Arises from chair without difficulty. Stance is hunched with scoliosis. Gait demonstrates normal stride length although decreased step height bilaterally with mild unsteadiness and use of rolling walker.  Tandem walk and heel toe not attempted. Reflexes: 1+ and symmetric. Toes downgoing.         ASSESSMENT: [name] is a 81 y.o. year old male with recurrent episodes of aphasia and AMS in 08/2020 (felt in setting of TIA 2/2 A fib not on AC vs L ICA bulb soft plaque), 09/2020 (felt TIA in setting of high risk of plaque of left ICA s/p L CEA), 12/2020 (post fall possibly presyncope vs TIA), 02/2021 (likely in setting of hypotension) and most recently 03/04/2022. Vascular risk factors include A. fib/flutter, HTN, HLD, bradycardia s/p pacer, advanced age and hx of EtOH use.      PLAN:  I had a long discussion with the patient and his wife regarding his TIA in June 2023 and chronic A-fib on anticoagulation with Eliquis and discussed risk for recurrent strokes and stroke prevention and answered questions.  Recommend he continue Eliquis for now but will discussed alternatives and lack of definitive data on switching anticoagulants.  I also discussed possibility of participating in [place] AF stroke prevention study ( Eliquis  versus Milvexian-Factor 11 inhibitor) . He expressed interest and was given written study material to review at home and decide.  He was also advised to maintain aggressive risk factor modification with strict control of hypertension with blood pressure goal below 140/90, lipids with LDL cholesterol goal below 70 mg percent and diabetes with hemoglobin A1c goal below 6.5%.  He was also advised to eat a healthy diet with lots of fruits, vegetables, fiber regularly.  Return for follow-up in the future in 1 year or call earlier if necessary. Greater than 50% time  during this 35-minute visit was spent on counseling and coordination of care about.  TIA and atrial fibrillation and discussion about stroke symptoms. Antony Contras, MD [facility] [address] [place], [place] [address]  Phone [phone] Fax [phone] Note: This document was prepared with digital dictation and possible smart phrase technology. Any transcriptional errors that result from this process are unintentional.

## 2022-09-20 ENCOUNTER — Other Ambulatory Visit: Payer: Self-pay | Admitting: *Deleted

## 2022-09-20 DIAGNOSIS — I6523 Occlusion and stenosis of bilateral carotid arteries: Secondary | ICD-10-CM

## 2022-09-20 DIAGNOSIS — Z9889 Other specified postprocedural states: Secondary | ICD-10-CM

## 2022-09-26 ENCOUNTER — Ambulatory Visit (INDEPENDENT_AMBULATORY_CARE_PROVIDER_SITE_OTHER): Payer: Medicare Other

## 2022-09-26 DIAGNOSIS — G459 Transient cerebral ischemic attack, unspecified: Secondary | ICD-10-CM

## 2022-09-26 LAB — CUP PACEART REMOTE DEVICE CHECK
Battery Remaining Longevity: 43 mo
Battery Remaining Percentage: 35 %
Battery Voltage: 2.95 V
Brady Statistic AP VP Percent: 1.2 %
Brady Statistic AP VS Percent: 97 %
Brady Statistic AS VP Percent: 1 %
Brady Statistic AS VS Percent: 1.9 %
Brady Statistic RA Percent Paced: 97 %
Brady Statistic RV Percent Paced: 1.2 %
Date Time Interrogation Session: 20231114020016
Implantable Lead Connection Status: 753985
Implantable Lead Connection Status: 753985
Implantable Lead Implant Date: 20160928
Implantable Lead Implant Date: 20160928
Implantable Lead Location: 753859
Implantable Lead Location: 753860
Implantable Lead Model: 1948
Implantable Pulse Generator Implant Date: 20160928
Lead Channel Impedance Value: 400 Ohm
Lead Channel Impedance Value: 540 Ohm
Lead Channel Pacing Threshold Amplitude: 0.75 V
Lead Channel Pacing Threshold Amplitude: 1 V
Lead Channel Pacing Threshold Pulse Width: 0.4 ms
Lead Channel Pacing Threshold Pulse Width: 0.4 ms
Lead Channel Sensing Intrinsic Amplitude: 1.9 mV
Lead Channel Sensing Intrinsic Amplitude: 2.8 mV
Lead Channel Setting Pacing Amplitude: 1.25 V
Lead Channel Setting Pacing Amplitude: 1.75 V
Lead Channel Setting Pacing Pulse Width: 0.4 ms
Lead Channel Setting Sensing Sensitivity: 0.7 mV
Pulse Gen Model: 2240
Pulse Gen Serial Number: 7817736

## 2022-09-26 NOTE — Progress Notes (Unsigned)
Office Note     CC:  follow up Requesting Provider:  [place], Betty G, MD  HPI: [name] is a 81 y.o. 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 He is status post left carotid endarterectomy by Dr. Randie Heinz on 09/16/20 for symptomatic 50% lesion.  Contralateral side less than 50% stenosis by CT angio. Duplex follow up has shown 1-39% bilaterally. At his last visit he had no new neurological symptoms referable to his carotid disease.  Today he denies any ***  The pt *** on a statin for cholesterol management.  The pt *** on a daily aspirin.   Other AC:  *** The pt *** on *** for hypertension.   The pt *** diabetic.  *** Tobacco hx:  ***  Past Medical History:  Diagnosis Date   Carotid artery occlusion    GERD (gastroesophageal reflux disease)    Hypertension    Stroke (HCC) 02/2021   TIA    Past Surgical History:  Procedure Laterality Date   CATARACT EXTRACTION, BILATERAL     ENDARTERECTOMY Left 09/16/2020   Procedure: LEFT ENDARTERECTOMY CAROTID;  Surgeon: Maeola Harman, MD;  Location: [facility];  Service: Vascular;  Laterality: Left;   HERNIA REPAIR     PACEMAKER IMPLANT     TONSILLECTOMY      Social History   Socioeconomic History   Marital status: Married    Spouse name: Pat   Number of children: Not on file   Years of education: Not on file   Highest education level: Not on file  Occupational History   Not on file  Tobacco Use   Smoking status: Former   Smokeless tobacco: Never  Vaping Use   Vaping Use: Never used  Substance and Sexual Activity   Alcohol use: Yes    Comment: occassional   Drug use: Never   Sexual activity: Not Currently  Other Topics Concern   Not on file  Social History Narrative   Lives with wife   Social Determinants of Health   Financial Resource Strain: Low Risk  (05/09/2022)   Overall Financial Resource Strain (CARDIA)    Difficulty of Paying Living Expenses: Not hard at all  Food  Insecurity: No Food Insecurity (05/09/2022)   Hunger Vital Sign    Worried About Running Out of Food in the Last Year: Never true    Ran Out of Food in the Last Year: Never true  Transportation Needs: No Transportation Needs (05/09/2022)   PRAPARE - Administrator, Civil Service (Medical): No    Lack of Transportation (Non-Medical): No  Physical Activity: Sufficiently Active (05/09/2022)   Exercise Vital Sign    Days of Exercise per Week: 5 days    Minutes of Exercise per Session: 30 min  Stress: No Stress Concern Present (05/09/2022)   [name] of Occupational Health - Occupational Stress Questionnaire    Feeling of Stress : Not at all  Social Connections: Moderately Integrated (05/03/2021)   Social Connection and Isolation Panel [NHANES]    Frequency of Communication with Friends and Family: More than three times a week    Frequency of Social Gatherings with Friends and Family: More than three times a week    Attends Religious Services: 1 to 4 times per year    Active Member of [employer] or Organizations: No    Attends Banker Meetings: Never    Marital Status: Married  Catering manager Violence: Not At Risk (05/03/2021)  Humiliation, Afraid, Rape, and Kick questionnaire    Fear of Current or Ex-Partner: No    Emotionally Abused: No    Physically Abused: No    Sexually Abused: No   *** Family History  Family history unknown: Yes    Current Outpatient Medications  Medication Sig Dispense Refill   acetaminophen (TYLENOL) 325 MG tablet Take 2 tablets (650 mg total) by mouth every 6 (six) hours as needed. 36 tablet 0   albuterol (VENTOLIN HFA) 108 (90 Base) MCG/ACT inhaler Inhale 2 puffs into the lungs 4 (four) times daily as needed for wheezing or shortness of breath. 8 g 1   apixaban (ELIQUIS) 5 MG TABS tablet Take 1 tablet (5 mg total) by mouth 2 (two) times daily. 180 tablet 1   atorvastatin (LIPITOR) 40 MG tablet Take 1 tablet (40 mg total) by mouth  daily. (Patient taking differently: Take 40 mg by mouth every evening.) 90 tablet 3   cholecalciferol (VITAMIN D) 25 MCG (1000 UNIT) tablet Take 1,000 Units by mouth at bedtime.     Ferrous Sulfate (IRON) 325 (65 Fe) MG TABS Take 1 tablet by mouth at bedtime.     Fluticasone Furoate (ARNUITY ELLIPTA) 200 MCG/ACT AEPB Inhale 1 puff into the lungs every morning. 90 each 3   Magnesium 250 MG TABS Take 250 mg by mouth every morning.     Multiple Vitamin (MULTIVITAMIN WITH MINERALS) TABS tablet Take 1 tablet by mouth every morning.     Omega-3 Fatty Acids (FISH OIL PO) Take 1 capsule by mouth every morning.     omeprazole (PRILOSEC) 40 MG capsule TAKE 1 CAPSULE BY MOUTH DAILY BEFORE SUPPER. 90 capsule 2   No current facility-administered medications for this visit.    No Known Allergies   REVIEW OF SYSTEMS:  *** [X]  denotes positive finding, [ ]  denotes negative finding Cardiac  Comments:  Chest pain or chest pressure:    Shortness of breath upon exertion:    Short of breath when lying flat:    Irregular heart rhythm:        Vascular    Pain in calf, thigh, or hip brought on by ambulation:    Pain in feet at night that wakes you up from your sleep:     Blood clot in your veins:    Leg swelling:         Pulmonary    Oxygen at home:    Productive cough:     Wheezing:         Neurologic    Sudden weakness in arms or legs:     Sudden numbness in arms or legs:     Sudden onset of difficulty speaking or slurred speech:    Temporary loss of vision in one eye:     Problems with dizziness:         Gastrointestinal    Blood in stool:     Vomited blood:         Genitourinary    Burning when urinating:     Blood in urine:        Psychiatric    Major depression:         Hematologic    Bleeding problems:    Problems with blood clotting too easily:        Skin    Rashes or ulcers:        Constitutional    Fever or chills:      PHYSICAL EXAMINATION:  There were  no vitals  filed for this visit.  General:  WDWN in NAD; vital signs documented above Gait: Not observed HENT: WNL, normocephalic Pulmonary: normal non-labored breathing , without Rales, rhonchi,  wheezing Cardiac: {Desc; regular/irreg:14544} HR, without  Murmurs {With/Without:20273} carotid bruit*** Abdomen: soft, NT, no masses Skin: {With/Without:20273} rashes Vascular Exam/Pulses:  Right Left  Radial {Exam; arterial pulse strength 0-4:30167} {Exam; arterial pulse strength 0-4:30167}  Ulnar {Exam; arterial pulse strength 0-4:30167} {Exam; arterial pulse strength 0-4:30167}  Femoral {Exam; arterial pulse strength 0-4:30167} {Exam; arterial pulse strength 0-4:30167}  Popliteal {Exam; arterial pulse strength 0-4:30167} {Exam; arterial pulse strength 0-4:30167}  DP {Exam; arterial pulse strength 0-4:30167} {Exam; arterial pulse strength 0-4:30167}  PT {Exam; arterial pulse strength 0-4:30167} {Exam; arterial pulse strength 0-4:30167}   Extremities: {With/Without:20273} ischemic changes, {With/Without:20273} Gangrene , {With/Without:20273} cellulitis; {With/Without:20273} open wounds;  Musculoskeletal: no muscle wasting or atrophy  Neurologic: A&O X 3;  No focal weakness or paresthesias are detected Psychiatric:  The pt has {Desc; normal/abnormal:11317::"Normal"} affect.   Non-Invasive Vascular Imaging:   ***    ASSESSMENT/PLAN:: 81 y.o. male here for follow up for ***   -***   Graceann Congress, PA-C [facility] [phone]  Clinic MD:   [facility]/ Randie Heinz

## 2022-09-27 ENCOUNTER — Ambulatory Visit (INDEPENDENT_AMBULATORY_CARE_PROVIDER_SITE_OTHER): Payer: Medicare Other | Admitting: Physician Assistant

## 2022-09-27 ENCOUNTER — Ambulatory Visit (HOSPITAL_COMMUNITY)
Admission: RE | Admit: 2022-09-27 | Discharge: 2022-09-27 | Disposition: A | Discharge status: Home / Self Care | Payer: Medicare Other | Source: Ambulatory Visit | Attending: Vascular Surgery | Admitting: Vascular Surgery

## 2022-09-27 ENCOUNTER — Encounter (HOSPITAL_COMMUNITY): Payer: Self-pay

## 2022-09-27 VITALS — BP 147/72 | HR 71 | Temp 97.7°F | Resp 20 | Ht 66.0 in | Wt 154.9 lb

## 2022-09-27 DIAGNOSIS — I6523 Occlusion and stenosis of bilateral carotid arteries: Secondary | ICD-10-CM | POA: Diagnosis not present

## 2022-09-27 DIAGNOSIS — Z9889 Other specified postprocedural states: Secondary | ICD-10-CM

## 2022-10-25 NOTE — Progress Notes (Signed)
Remote pacemaker transmission.   

## 2022-11-17 ENCOUNTER — Other Ambulatory Visit: Payer: Self-pay

## 2022-11-17 ENCOUNTER — Emergency Department (HOSPITAL_COMMUNITY)
Admission: EM | Admit: 2022-11-17 | Discharge: 2022-11-18 | Disposition: A | Discharge status: Home / Self Care | Payer: Medicare Other | Attending: Emergency Medicine | Admitting: Emergency Medicine

## 2022-11-17 ENCOUNTER — Emergency Department (HOSPITAL_COMMUNITY): Payer: Medicare Other

## 2022-11-17 ENCOUNTER — Encounter (HOSPITAL_COMMUNITY): Payer: Self-pay | Admitting: Emergency Medicine

## 2022-11-17 DIAGNOSIS — S41111A Laceration without foreign body of right upper arm, initial encounter: Secondary | ICD-10-CM | POA: Insufficient documentation

## 2022-11-17 DIAGNOSIS — S0990XA Unspecified injury of head, initial encounter: Secondary | ICD-10-CM

## 2022-11-17 DIAGNOSIS — Z7901 Long term (current) use of anticoagulants: Secondary | ICD-10-CM | POA: Insufficient documentation

## 2022-11-17 DIAGNOSIS — S0101XA Laceration without foreign body of scalp, initial encounter: Secondary | ICD-10-CM | POA: Diagnosis not present

## 2022-11-17 DIAGNOSIS — W01198A Fall on same level from slipping, tripping and stumbling with subsequent striking against other object, initial encounter: Secondary | ICD-10-CM | POA: Diagnosis not present

## 2022-11-17 DIAGNOSIS — Y92001 Dining room of unspecified non-institutional (private) residence as the place of occurrence of the external cause: Secondary | ICD-10-CM | POA: Insufficient documentation

## 2022-11-17 DIAGNOSIS — S51812A Laceration without foreign body of left forearm, initial encounter: Secondary | ICD-10-CM | POA: Diagnosis not present

## 2022-11-17 MED ORDER — LIDOCAINE-EPINEPHRINE (PF) 2 %-1:200000 IJ SOLN
20.0000 mL | Freq: Once | INTRAMUSCULAR | Status: AC
  Administered 2022-11-17: 20 mL
  Filled 2022-11-17: qty 20

## 2022-11-17 NOTE — ED Provider Notes (Signed)
[facility] EMERGENCY DEPARTMENT Provider Note   CSN: [id number] Arrival date & time: 11/17/22  1818     History  Chief Complaint  Patient presents with   [name] is a 82 y.o. male.  82 yo M with a chief complaints of a fall.  The patient says that he slipped on a carpet and struck the back of his head.  Complaining mostly of head pain.  Also noted to have skin tears to both of his arms.  He denies any obvious bony tenderness.  Denies chest pain back pain abdominal pain.  Has been able to ambulate after.  He is on Eliquis.   Fall       Home Medications Prior to Admission medications   Medication Sig Start Date End Date Taking? Authorizing Provider  acetaminophen (TYLENOL) 325 MG tablet Take 2 tablets (650 mg total) by mouth every 6 (six) hours as needed. 03/04/22   Jeanell Sparrow, DO  albuterol (VENTOLIN HFA) 108 (90 Base) MCG/ACT inhaler Inhale 2 puffs into the lungs 4 (four) times daily as needed for wheezing or shortness of breath. 09/21/21   [place], Betty G, MD  apixaban (ELIQUIS) 5 MG TABS tablet Take 1 tablet (5 mg total) by mouth 2 (two) times daily. 08/08/22   [place], Betty G, MD  atorvastatin (LIPITOR) 40 MG tablet Take 1 tablet (40 mg total) by mouth daily. Patient taking differently: Take 40 mg by mouth every evening. 04/17/22   [place], Betty G, MD  cholecalciferol (VITAMIN D) 25 MCG (1000 UNIT) tablet Take 1,000 Units by mouth at bedtime.    [provider]  Ferrous Sulfate (IRON) 325 (65 Fe) MG TABS Take 1 tablet by mouth at bedtime.    [provider]  Fluticasone Furoate (ARNUITY ELLIPTA) 200 MCG/ACT AEPB Inhale 1 puff into the lungs every morning. 07/21/22   Martin[place], Betty G, MD  Magnesium 250 MG TABS Take 250 mg by mouth every morning.    [provider]  Multiple Vitamin (MULTIVITAMIN WITH MINERALS) TABS tablet Take 1 tablet by mouth every morning.    [provider]  Omega-3 Fatty Acids (FISH OIL PO)  Take 1 capsule by mouth every morning.    [provider]  omeprazole (PRILOSEC) 40 MG capsule TAKE 1 CAPSULE BY MOUTH DAILY BEFORE SUPPER. Patient taking differently: Take 40 mg by mouth daily. 06/12/22   [place], Betty G, MD      Allergies    Patient has no known allergies.    Review of Systems   Review of Systems  Physical Exam Updated Vital Signs BP (!) 168/72   Pulse 63   Resp 18   Ht 5\' 6"  (1.676 m)   Wt 70.6 kg   SpO2 100%   BMI 25.12 kg/m  Physical Exam Vitals and nursing note reviewed.  Constitutional:      Appearance: He is well-developed.  HENT:     Head: Normocephalic.     Comments: Approximately 2.6 cm laceration to the right scalp just behind the right ear. Eyes:     Pupils: Pupils are equal, round, and reactive to light.  Neck:     Vascular: No JVD.  Cardiovascular:     Rate and Rhythm: Normal rate and regular rhythm.     Heart sounds: No murmur heard.    No friction rub. No gallop.  Pulmonary:     Effort: No respiratory distress.     Breath sounds: No wheezing.  Abdominal:     General: There is no distension.     Tenderness: There is no abdominal tenderness. There is no guarding or rebound.  Musculoskeletal:        General: Normal range of motion.     Cervical back: Normal range of motion and neck supple.     Comments: Skin tear to the right upper arm without obvious bony tenderness.  Full range of motion of the shoulder and the elbow.  Skin tear to the left distal radius area.  Full range of motion of the wrist able to supinate and pronate without issue.  No snuffbox tenderness.  No midline spinal tenderness step-offs or deformities.  Palpated from head to toe without other noted areas of bony tenderness.  Skin:    Coloration: Skin is not pale.     Findings: No rash.  Neurological:     Mental Status: He is alert and oriented to person, place, and time.  Psychiatric:        Behavior: Behavior normal.     ED Results / Procedures /  Treatments   Labs (all labs ordered are listed, but only abnormal results are displayed) Labs Reviewed - No data to display  EKG None  Radiology CT Cervical Spine Wo Contrast  Result Date: 11/17/2022 CLINICAL DATA:  Tripped and fell, hit head EXAM: CT CERVICAL SPINE WITHOUT CONTRAST TECHNIQUE: Multidetector CT imaging of the cervical spine was performed without intravenous contrast. Multiplanar CT image reconstructions were also generated. RADIATION DOSE REDUCTION: This exam was performed according to the departmental dose-optimization program which includes automated exposure control, adjustment of the mA and/or kV according to patient size and/or use of iterative reconstruction technique. 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 Continue degenerative anterolisthesis of C3 relative to C4. Skull base and vertebrae: No acute fracture. No primary bone lesion or focal pathologic process. Soft tissues and spinal canal: No prevertebral fluid or swelling. No visible canal hematoma. Disc levels: Stable extensive multilevel cervical spondylosis and facet hypertrophy. Disc space narrowing and osteophyte formation most pronounced from C4-5 through C7-T1. Severe facet hypertrophic changes are seen, left greater than right, from C2-3 through C4-5. Upper chest: Airway is patent.  Lung apices are clear. Other: Reconstructed images demonstrate no additional findings. IMPRESSION: 1. No acute cervical spine fracture. 2. Stable multilevel degenerative changes. Electronically Signed   By: Randa Ngo M.D.   On: 11/17/2022 19:12   CT Head Wo Contrast  Result Date: 11/17/2022 CLINICAL DATA:  Tripped and fell, hit head EXAM: CT HEAD WITHOUT CONTRAST TECHNIQUE: Contiguous axial images were obtained from the base of the skull through the vertex without intravenous contrast. RADIATION DOSE REDUCTION: This exam  was performed according to the departmental dose-optimization program which includes automated exposure control, adjustment of the mA and/or kV according to patient size and/or use of iterative reconstruction technique. COMPARISON:  05/03/2022 FINDINGS: Brain: Stable chronic small-vessel ischemic changes throughout the periventricular white matter. No evidence of acute infarct or hemorrhage. Lateral ventricles and midline structures are stable. No acute extra-axial fluid collections. No mass effect. Stable cerebral atrophy. Vascular: Stable atherosclerosis.  No hyperdense vessel. Skull: Normal. Negative for fracture or focal lesion. Sinuses/Orbits: No acute finding. Other: None. IMPRESSION: 1. Stable head CT, no acute intracranial process. Electronically Signed   By: Randa Ngo M.D.   On: 11/17/2022 19:10    Procedures Procedures    Medications Ordered in ED Medications  lidocaine-EPINEPHrine (  XYLOCAINE W/EPI) 2 %-1:200000 (PF) injection 20 mL (20 mLs Infiltration Given by Other 11/17/22 2037)    ED Course/ Medical Decision Making/ A&P                           Medical Decision Making Amount and/or Complexity of Data Reviewed Radiology: ordered.  Risk Prescription drug management.   82 yo M with a chief complaints of the fall from standing.  Nonsyncopal by history.  Complaining mostly of head injury.  On Eliquis and per hospital system protocol arrived as a level 2 trauma.  Assessed quickly on arrival.  CT imaging of the head and C-spine performed.  Negative for intracranial pathology.  Negative for intraspinal pathology.  Local wound care performed.  Scalp lack repaired.  Discharge home.  8:59 PM:  I have discussed the diagnosis/risks/treatment options with the patient and family.  Evaluation and diagnostic testing in the emergency department does not suggest an emergent condition requiring admission or immediate intervention beyond what has been performed at this time.  They will follow  up with PCP. We also discussed returning to the ED immediately if new or worsening sx occur. We discussed the sx which are most concerning (e.g., sudden worsening pain, fever, inability to tolerate by mouth) that necessitate immediate return. Medications administered to the patient during their visit and any new prescriptions provided to the patient are listed below.  Medications given during this visit Medications  lidocaine-EPINEPHrine (XYLOCAINE W/EPI) 2 %-1:200000 (PF) injection 20 mL (20 mLs Infiltration Given by Other 11/17/22 2037)     The patient appears reasonably screen and/or stabilized for discharge and I doubt any other medical condition or other [facility] requiring further screening, evaluation, or treatment in the ED at this time prior to discharge.          Final Clinical Impression(s) / ED Diagnoses Final diagnoses:  Injury of head, initial encounter  Laceration of scalp, initial encounter    Rx / DC Orders ED Discharge Orders     None         Melene Plan, DO 11/17/22 2059

## 2022-11-17 NOTE — ED Notes (Signed)
Attempted to call report back to [facility], was unable to contact anyone.

## 2022-11-17 NOTE — ED Provider Notes (Signed)
..  Laceration Repair  Date/Time: 11/17/2022 8:37 PM  Performed by: Chuck Hint, PA-C Authorized by: Chuck Hint, PA-C   Consent:    Consent obtained:  Verbal   Consent given by:  Patient   Risks, benefits, and alternatives were discussed: yes     Risks discussed:  Infection, pain, retained foreign body, poor cosmetic result and need for additional repair   Alternatives discussed:  No treatment Universal protocol:    Test results available: no     Imaging studies available: no     Patient identity confirmed:  Verbally with patient Anesthesia:    Anesthesia method:  Local infiltration   Local anesthetic:  Lidocaine 2% WITH epi Laceration details:    Location:  Scalp   Scalp location:  R parietal   Length (cm):  3   Depth (mm):  3 Pre-procedure details:    Preparation:  Patient was prepped and draped in usual sterile fashion Exploration:    Imaging outcome: foreign body not noted     Wound exploration: entire depth of wound visualized   Treatment:    Area cleansed with:  Saline   Amount of cleaning:  Extensive   Irrigation solution:  Sterile saline   Irrigation volume:  500 mL   Irrigation method:  Syringe   Visualized foreign bodies/material removed: no   Skin repair:    Repair method:  Staples   Number of staples:  2 Approximation:    Approximation:  Close Repair type:    Repair type:  Simple Post-procedure details:    Dressing:  Bulky dressing   Procedure completion:  Leonie Douglas, PA-C 11/17/22 2056    Deno Etienne, DO 11/17/22 2126

## 2022-11-17 NOTE — Discharge Instructions (Signed)
Return for redness drainage or if you get a fever.  Typically the staples will be removed sometime between day 7 and 10.  This can be done here at urgent care at your family doctor's office.  The area can get wet but not fully immersed underwater.  No scrubbing.  If you really want to clean it you can apply a half-and-half hydrogen peroxide solution with water on a Q-tip.  You can apply an ointment a couple times a day this could be as simple as Vaseline but could also be an antibiotic ointment if you wish.  Once it is healed please try to avoid prolonged sun exposure use sunscreen.    As we discussed please return for worsening headache confusion or vomiting.

## 2022-11-17 NOTE — Progress Notes (Signed)
Orthopedic Tech Progress Note Patient Details:  [name] 1941/03/14 [id number]  Patient ID: [name], male   DOB: 1941-02-07, 82 y.o.   MRN: [id number] Level II; not currently needed. Vernona Rieger 11/17/2022, 6:41 PM

## 2022-11-17 NOTE — ED Triage Notes (Addendum)
Pt to ER via EMS from [place].  Pt was ambulating to dinning hall with his cane when he got tripped up, stumbled  hitting head on a vase sitting on the fireplace and then fell into the fireplace.  Pt denies LOC, does take Eliquis for hx of TIA.  Pt with skin tear to right upper arm and left forearm.  C-collar intact per EMS.  Pt voices no c/o at this time.

## 2022-11-21 ENCOUNTER — Telehealth: Payer: Self-pay

## 2022-11-21 NOTE — Telephone Encounter (Signed)
---  The caller states that he fell and tripped and fell onto hearth and scraped arms on the stone and hurt his left wrist and right elbow and shoulder and two staples in his head. He was seen in the ED. The one on the upper shoulder is the one that was bleeding. He was concerned about bleeding  11/20/2022 4:10:38 PM Home Care Zorita Pang, RN, [facility] Advice Given Per Guideline CALL BACK IF: * You become worse

## 2022-11-21 NOTE — Patient Outreach (Signed)
  Care Coordination TOC Note Transition Care Management Follow-up Telephone Call Date of discharge and from where: 11/17/22-[facility]   Dx: "injury of head, fall" Red on EMMI-ED Discharge Alert Reason: "Scheduled follow-up appt? No" Red Alert Date: 11/19/22 How have you been since you were released from the hospital? Patient stated things going "pretty well and on the up and out." He reports wife changing dressing daily as ordered. He has some bleeding wit dressing changes. Area shows no s/s of infection. His arm is a little swollen-encouraged to elevate. He denies any pain at present. States he had a little ache/discomfort last night-relieved with taking Ibuprofen. Advised patient of increased risk of bleeding associated with taking Ibuprofen while on blood thinner and encouraged patient to take Tylenol as needed. He voiced understanding.  Any questions or concerns?   Items Reviewed: Did the pt receive and understand the discharge instructions provided? Yes  Medications obtained and verified? Yes  Other? No  Any new allergies since your discharge? No  Dietary orders reviewed? Yes Do you have support at home? Yes -wife  Home Care and Equipment/Supplies: Were home health services ordered? not applicable If so, what is the name of the agency? N/A  Has the agency set up a time to come to the patient's home? not applicable Were any new equipment or medical supplies ordered?  No What is the name of the medical supply agency? N/A Were you able to get the supplies/equipment? not applicable Do you have any questions related to the use of the equipment or supplies? No  Functional Questionnaire: (I = Independent and D = Dependent) ADLs: A  Bathing/Dressing- A  Meal Prep- A  Eating- I  Maintaining continence- I  Transferring/Ambulation- I  Managing Meds- I  Follow up appointments reviewed:  PCP Hospital f/u appt confirmed?  Scheduled to see Dr. Martinique on 11/28/22 @ [facility]  f/u appt confirmed?  N/A   Are transportation arrangements needed? No  If their condition worsens, is the pt aware to call PCP or go to the Emergency Dept.? Yes Was the patient provided with contact information for the PCP's office or ED? Yes Was to pt encouraged to call back with questions or concerns? Yes  SDOH assessments and interventions completed:   Yes SDOH Interventions Today    Flowsheet Row Most Recent Value  SDOH Interventions   Food Insecurity Interventions Intervention Not Indicated  Transportation Interventions Intervention Not Indicated       Care Coordination Interventions:  Education provided    Encounter Outcome:  Pt. Visit Completed    Enzo Montgomery, RN,BSN,CCM [facility] Telephonic Care Management Coordinator Direct Phone: [phone] Toll Free: [phone] Fax: [phone]

## 2022-11-27 NOTE — Progress Notes (Deleted)
ACUTE VISIT No chief complaint on file.  HPI: Mr.Karver Darnell Level Digirolamo is a 82 y.o. male, who is here today complaining of *** HPI  Review of Systems See other pertinent positives and negatives in HPI.  Current Outpatient Medications on File Prior to Visit  Medication Sig Dispense Refill   acetaminophen (TYLENOL) 325 MG tablet Take 2 tablets (650 mg total) by mouth every 6 (six) hours as needed. 36 tablet 0   albuterol (VENTOLIN HFA) 108 (90 Base) MCG/ACT inhaler Inhale 2 puffs into the lungs 4 (four) times daily as needed for wheezing or shortness of breath. 8 g 1   apixaban (ELIQUIS) 5 MG TABS tablet Take 1 tablet (5 mg total) by mouth 2 (two) times daily. 180 tablet 1   atorvastatin (LIPITOR) 40 MG tablet Take 1 tablet (40 mg total) by mouth daily. (Patient taking differently: Take 40 mg by mouth every evening.) 90 tablet 3   cholecalciferol (VITAMIN D) 25 MCG (1000 UNIT) tablet Take 1,000 Units by mouth at bedtime.     Ferrous Sulfate (IRON) 325 (65 Fe) MG TABS Take 1 tablet by mouth at bedtime.     Fluticasone Furoate (ARNUITY ELLIPTA) 200 MCG/ACT AEPB Inhale 1 puff into the lungs every morning. 90 each 3   Magnesium 250 MG TABS Take 250 mg by mouth every morning.     Multiple Vitamin (MULTIVITAMIN WITH MINERALS) TABS tablet Take 1 tablet by mouth every morning.     Omega-3 Fatty Acids (FISH OIL PO) Take 1 capsule by mouth every morning.     omeprazole (PRILOSEC) 40 MG capsule TAKE 1 CAPSULE BY MOUTH DAILY BEFORE SUPPER. (Patient taking differently: Take 40 mg by mouth daily.) 90 capsule 2   No current facility-administered medications on file prior to visit.    Past Medical History:  Diagnosis Date   Carotid artery occlusion    GERD (gastroesophageal reflux disease)    Hypertension    Stroke ([place]) 02/2021   TIA   No Known Allergies  Social History   Socioeconomic History   Marital status: Married    Spouse name: Electrical engineer   Number of children: Not on file   Years of education:  Not on file   Highest education level: Not on file  Occupational History   Not on file  Tobacco Use   Smoking status: Former    Passive exposure: Never   Smokeless tobacco: Never  Vaping Use   Vaping Use: Never used  Substance and Sexual Activity   Alcohol use: Yes    Comment: occassional   Drug use: Never   Sexual activity: Not Currently  Other Topics Concern   Not on file  Social History Narrative   Lives with wife   Social Determinants of Health   Financial Resource Strain: Low Risk  (05/09/2022)   Overall Financial Resource Strain (CARDIA)    Difficulty of Paying Living Expenses: Not hard at all  Food Insecurity: No Food Insecurity (11/21/2022)   Hunger Vital Sign    Worried About Running Out of Food in the Last Year: Never true    [place] in the Last Year: Never true  Transportation Needs: No Transportation Needs (11/21/2022)   PRAPARE - Hydrologist (Medical): No    Lack of Transportation (Non-Medical): No  Physical Activity: Sufficiently Active (05/09/2022)   Exercise Vital Sign    Days of Exercise per Week: 5 days    Minutes of Exercise per Session: 30 min  Stress: No Stress Concern Present (05/09/2022)   [place]    Feeling of Stress : Not at all  Social Connections: Moderately Integrated (05/03/2021)   Social Connection and Isolation Panel [NHANES]    Frequency of Communication with Friends and Family: More than three times a week    Frequency of Social Gatherings with Friends and Family: More than three times a week    Attends Religious Services: 1 to 4 times per year    Active Member of [employer] or Organizations: No    Attends Archivist Meetings: Never    Marital Status: Married    There were no vitals filed for this visit. There is no height or weight on file to calculate BMI.  Physical Exam  ASSESSMENT AND PLAN: There are no diagnoses linked to  this encounter.  No follow-ups on file.  Betty G. Martinique, MD  [facility]. [place] office.  Discharge Instructions   None

## 2022-11-28 ENCOUNTER — Ambulatory Visit: Payer: Medicare Other | Admitting: Internal Medicine

## 2022-11-28 ENCOUNTER — Ambulatory Visit: Payer: Medicare Other | Admitting: Family Medicine

## 2022-11-28 ENCOUNTER — Ambulatory Visit (INDEPENDENT_AMBULATORY_CARE_PROVIDER_SITE_OTHER): Payer: Medicare Other | Admitting: Adult Health

## 2022-11-28 VITALS — BP 130/70 | HR 64 | Temp 97.8°F | Ht 66.0 in | Wt 155.0 lb

## 2022-11-28 DIAGNOSIS — S41111D Laceration without foreign body of right upper arm, subsequent encounter: Secondary | ICD-10-CM | POA: Diagnosis not present

## 2022-11-28 DIAGNOSIS — Z4802 Encounter for removal of sutures: Secondary | ICD-10-CM | POA: Diagnosis not present

## 2022-11-28 DIAGNOSIS — S0101XD Laceration without foreign body of scalp, subsequent encounter: Secondary | ICD-10-CM | POA: Diagnosis not present

## 2022-11-28 DIAGNOSIS — T148XXA Other injury of unspecified body region, initial encounter: Secondary | ICD-10-CM

## 2022-11-28 NOTE — Progress Notes (Signed)
Subjective:    Patient ID: [name], male    DOB: 01/31/1941, 82 y.o.   MRN: [id number]  HPI 82 year old male who  has a past medical history of Carotid artery occlusion, GERD (gastroesophageal reflux disease), Hypertension, and Stroke ([place]) (02/2021).  He presents to the office today for staple removal. He was seen in the ED roughly 11 days ago after he slipped on a rug at home striking the back of his head. He is on Eliquis - CT of head and c-spine were negative.   He has two staples placed for a 3 cm laceration on the right parietal scalp  He also has a skin tear on his left arm and right upper arm.    Review of Systems See HPI   Past Medical History:  Diagnosis Date   Carotid artery occlusion    GERD (gastroesophageal reflux disease)    Hypertension    Stroke ([place]) 02/2021   TIA    Social History   Socioeconomic History   Marital status: Married    Spouse name: Electrical engineer   Number of children: Not on file   Years of education: Not on file   Highest education level: Not on file  Occupational History   Not on file  Tobacco Use   Smoking status: Former    Passive exposure: Never   Smokeless tobacco: Never  Vaping Use   Vaping Use: Never used  Substance and Sexual Activity   Alcohol use: Yes    Comment: occassional   Drug use: Never   Sexual activity: Not Currently  Other Topics Concern   Not on file  Social History Narrative   Lives with wife   Social Determinants of Health   Financial Resource Strain: Low Risk  (05/09/2022)   Overall Financial Resource Strain (CARDIA)    Difficulty of Paying Living Expenses: Not hard at all  Food Insecurity: No Food Insecurity (11/21/2022)   Hunger Vital Sign    Worried About Running Out of Food in the Last Year: Never true    [place] in the Last Year: Never true  Transportation Needs: No Transportation Needs (11/21/2022)   PRAPARE - Hydrologist (Medical): No    Lack of Transportation  (Non-Medical): No  Physical Activity: Sufficiently Active (05/09/2022)   Exercise Vital Sign    Days of Exercise per Week: 5 days    Minutes of Exercise per Session: 30 min  Stress: No Stress Concern Present (05/09/2022)   [place]    Feeling of Stress : Not at all  Social Connections: Moderately Integrated (05/03/2021)   Social Connection and Isolation Panel [NHANES]    Frequency of Communication with Friends and Family: More than three times a week    Frequency of Social Gatherings with Friends and Family: More than three times a week    Attends Religious Services: 1 to 4 times per year    Active Member of [employer] or Organizations: No    Attends Archivist Meetings: Never    Marital Status: Married  Human resources officer Violence: Not At Risk (05/03/2021)   Humiliation, Afraid, Rape, and Kick questionnaire    Fear of Current or Ex-Partner: No    Emotionally Abused: No    Physically Abused: No    Sexually Abused: No    Past Surgical History:  Procedure Laterality Date   CATARACT EXTRACTION, BILATERAL     ENDARTERECTOMY  Left 09/16/2020   Procedure: LEFT ENDARTERECTOMY CAROTID;  Surgeon: Waynetta Sandy, MD;  Location: [facility];  Service: Vascular;  Laterality: Left;   HERNIA REPAIR     PACEMAKER IMPLANT     TONSILLECTOMY      Family History  Family history unknown: Yes    No Known Allergies  Current Outpatient Medications on File Prior to Visit  Medication Sig Dispense Refill   acetaminophen (TYLENOL) 325 MG tablet Take 2 tablets (650 mg total) by mouth every 6 (six) hours as needed. 36 tablet 0   albuterol (VENTOLIN HFA) 108 (90 Base) MCG/ACT inhaler Inhale 2 puffs into the lungs 4 (four) times daily as needed for wheezing or shortness of breath. 8 g 1   apixaban (ELIQUIS) 5 MG TABS tablet Take 1 tablet (5 mg total) by mouth 2 (two) times daily. 180 tablet 1   atorvastatin (LIPITOR) 40 MG tablet  Take 1 tablet (40 mg total) by mouth daily. (Patient taking differently: Take 40 mg by mouth every evening.) 90 tablet 3   cholecalciferol (VITAMIN D) 25 MCG (1000 UNIT) tablet Take 1,000 Units by mouth at bedtime.     docusate sodium (COLACE) 50 MG capsule Take 50 mg by mouth 2 (two) times daily.     Ferrous Sulfate (IRON) 325 (65 Fe) MG TABS Take 1 tablet by mouth at bedtime.     Fluticasone Furoate (ARNUITY ELLIPTA) 200 MCG/ACT AEPB Inhale 1 puff into the lungs every morning. 90 each 3   latanoprost (XALATAN) 0.005 % ophthalmic solution 1 drop at bedtime.     Magnesium 250 MG TABS Take 250 mg by mouth every morning.     Multiple Vitamin (MULTIVITAMIN WITH MINERALS) TABS tablet Take 1 tablet by mouth every morning.     Omega-3 Fatty Acids (FISH OIL PO) Take 1 capsule by mouth every morning.     omeprazole (PRILOSEC) 40 MG capsule TAKE 1 CAPSULE BY MOUTH DAILY BEFORE SUPPER. (Patient taking differently: Take 40 mg by mouth daily.) 90 capsule 2   No current facility-administered medications on file prior to visit.    BP 130/70   Pulse 64   Temp 97.8 F (36.6 C) (Oral)   Ht 5\' 6"  (1.676 m)   Wt 155 lb (70.3 kg)   SpO2 98%   BMI 25.02 kg/m       Objective:   Physical Exam Vitals and nursing note reviewed.  Constitutional:      Appearance: Normal appearance.  Musculoskeletal:        General: Normal range of motion.  Skin:    General: Skin is warm and dry.  Neurological:     General: No focal deficit present.     Mental Status: He is alert and oriented to person, place, and time.  Psychiatric:        Mood and Affect: Mood normal.        Behavior: Behavior normal.        Thought Content: Thought content normal.        Judgment: Judgment normal.       Assessment & Plan:   1. Removal of staple - Verbal consent obtained. Wound appears well healed. Two staples were removed and patient tolerated procedure well   2. Multiple skin tears - Skin tears appear to be healing well.  No signs of infection   Dorothyann Peng, NP

## 2022-12-07 ENCOUNTER — Telehealth: Payer: Self-pay | Admitting: Family Medicine

## 2022-12-07 DIAGNOSIS — Z0110 Encounter for examination of ears and hearing without abnormal findings: Secondary | ICD-10-CM

## 2022-12-07 NOTE — Telephone Encounter (Addendum)
Pt was seen on 11/28/22 by NP C. Nafziger.   Pt called to say he forgot to request a referral to [facility].  Pt was already scheduled at [facility] for 01/02/23, but will need to move it to a later date because he will be out of town.  Pt asked for a call back, once referral is created.  Please advise.

## 2022-12-08 NOTE — Telephone Encounter (Signed)
I spoke with patient and his wife, referral has been placed.

## 2022-12-26 ENCOUNTER — Ambulatory Visit: Payer: Medicare Other

## 2022-12-26 DIAGNOSIS — G459 Transient cerebral ischemic attack, unspecified: Secondary | ICD-10-CM | POA: Diagnosis not present

## 2022-12-26 LAB — CUP PACEART REMOTE DEVICE CHECK
Battery Remaining Longevity: 40 mo
Battery Remaining Percentage: 32 %
Battery Voltage: 2.95 V
Brady Statistic AP VP Percent: 1.2 %
Brady Statistic AP VS Percent: 97 %
Brady Statistic AS VP Percent: 1 %
Brady Statistic AS VS Percent: 2 %
Brady Statistic RA Percent Paced: 97 %
Brady Statistic RV Percent Paced: 1.3 %
Date Time Interrogation Session: 20240213020013
Implantable Lead Connection Status: 753985
Implantable Lead Connection Status: 753985
Implantable Lead Implant Date: 20160928
Implantable Lead Implant Date: 20160928
Implantable Lead Location: 753859
Implantable Lead Location: 753860
Implantable Lead Model: 1948
Implantable Pulse Generator Implant Date: 20160928
Lead Channel Impedance Value: 390 Ohm
Lead Channel Impedance Value: 540 Ohm
Lead Channel Pacing Threshold Amplitude: 0.75 V
Lead Channel Pacing Threshold Amplitude: 1 V
Lead Channel Pacing Threshold Pulse Width: 0.4 ms
Lead Channel Pacing Threshold Pulse Width: 0.4 ms
Lead Channel Sensing Intrinsic Amplitude: 1.8 mV
Lead Channel Sensing Intrinsic Amplitude: 3.3 mV
Lead Channel Setting Pacing Amplitude: 1.25 V
Lead Channel Setting Pacing Amplitude: 1.75 V
Lead Channel Setting Pacing Pulse Width: 0.4 ms
Lead Channel Setting Sensing Sensitivity: 0.7 mV
Pulse Gen Model: 2240
Pulse Gen Serial Number: 7817736

## 2023-01-15 ENCOUNTER — Other Ambulatory Visit: Payer: Self-pay

## 2023-01-15 DIAGNOSIS — J4520 Mild intermittent asthma, uncomplicated: Secondary | ICD-10-CM

## 2023-01-15 DIAGNOSIS — I10 Essential (primary) hypertension: Secondary | ICD-10-CM

## 2023-01-18 ENCOUNTER — Other Ambulatory Visit: Payer: Self-pay | Admitting: Family Medicine

## 2023-01-18 DIAGNOSIS — I480 Paroxysmal atrial fibrillation: Secondary | ICD-10-CM

## 2023-01-23 ENCOUNTER — Telehealth: Payer: Self-pay

## 2023-01-23 NOTE — Progress Notes (Signed)
Care Management & Coordination Services Pharmacy Team  Reason for Encounter: Appointment Reminder  Contacted patient to confirm in office appointment with Burman Riis, PharmD on 01/29/2023 at 4:00. Spoke with patient on 01/23/2023   Have you seen any other providers since your last visit? **Patient has seen Dr Katy Fitch and is currently using Timolol 0.5 % eye drops.  Any changes in your medications or health? Patient denies  Any side effects from any medications? Patient states he is having difficulty with nail growth and nails breaking.   Do you have an symptoms or problems not managed by your medications? Patient denies  Any concerns about your health right now? 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  (Example: washing dishes)  Has your provider asked that you check blood pressure, blood sugar, or follow special diet at home? Patient is having issues eating a low sodium diet due [place], he can't always control what they put in the food. He does not eat salt. Patient is checking is blood pressures.   Do you get any type of exercise on a regular basis? Yes, patient will walk 1-2 miles daily and rides stationary bike along with some gym exercise.   Can you think of a goal you would like to reach for your health? Patient denies  Do you have any problems getting your medications? Patient denies  Is there anything that you would like to discuss during the appointment? Patient denies other than concerns listed above.   Please bring medications and supplements to appointment  Chart review:  Recent office visits:  11/28/2022 Dorothyann Peng NP - Patient was seen for removal of staple and an additional concern. No medication changes.   Recent consult visits:  09/28/2023 Corrina Baglia PA-C - Patient was seen for Bilateral carotid artery stenosis and an  additional concern. No medication changes.   08/16/2023 Antony Contras MD (neurology) - Patient was seen for TIA and additional concern. No medication changes.   Hospital visits:  Patient was seen at [facility] ED on 11/17/2022 (13 hours) due to Injury of head, initial encounter . Discharge date was 11/18/2022.  New?Medications Started at [facility] Discharge:?? None Medication Changes at Hospital Discharge: None Medications Discontinued at Hospital Discharge: None Medications that remain the same after Hospital Discharge:??  -All other medications will remain the same.    Care Gaps: AWV - completed 05/09/2022  Last BP - 130/70 on 11/28/2022 Covid - overdue Flu - postponed  Star Rating Drugs:  Atorvastatin 40 mg - last filled 01/13/2023 90 DS at [facility] Pharmacist Assistant [phone]

## 2023-01-25 NOTE — Progress Notes (Signed)
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 -ORDER CMP and lipid panel (last checked 2022) - will need baseline CMP before starting any BP medications, with PCP approval -Counseled patient to watch for signs of bleed and to avoid NSAID use with Eliquis -Notify PCP that patient is requesting an evaluation for their scoliosis and neuropathy (open to specialist referrals if needed)   Follow up plan: BP call in 1 to 2 weeks Pharmacist visit in 3 months   Subjective: [name] is an 82 y.o. year old male who is a primary patient of [name].  The care coordination team was consulted for assistance with disease management and care coordination needs.    Engaged with patient face to face for initial visit. Wife is also present in office. [place]. Lived in [place] up to 2.5 years ago. 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  Recent office visits: 11/28/2022 Dorothyann Peng NP - Patient was seen for removal of staple and an additional concern. No medication changes.    Recent consult visits: 11/27/2022 Corrina Baglia PA-C - Patient was seen for Bilateral carotid artery stenosis and an additional concern. No medication changes.    08/16/2023 Antony Contras Lester (neurology) - Patient was seen for TIA and additional concern. No medication changes.   Hospital visits: 11/17/22 [facility] [place] - For injury of head, LOS 13 hours, no  med changes   Objective:  Lab Results  Component Value Date   CREATININE 1.01 05/03/2022   BUN 17 05/03/2022   GFR 68.52 04/14/2021   GFRNONAA >60 05/03/2022   NA 136 05/03/2022   K 3.9 05/03/2022   CALCIUM 9.2 05/03/2022   CO2 25 05/03/2022   GLUCOSE 92 05/03/2022    Lab Results  Component Value Date/Time   HGBA1C 5.6 03/12/2021 02:42 AM   HGBA1C 5.6 12/25/2020 03:31 AM   GFR 68.52 04/14/2021 10:42 AM    Last diabetic Eye exam: No results found for: "HMDIABEYEEXA"  Last diabetic Foot exam: No results found for: "HMDIABFOOTEX"   Lab Results  Component Value Date   CHOL 78 03/12/2021   HDL 33 (L) 03/12/2021   LDLCALC 31 03/12/2021   TRIG 68 03/12/2021   CHOLHDL 2.4 03/12/2021       Latest Ref Rng & Units 05/03/2022    2:49 PM 03/04/2022   10:28 AM 03/03/2022    7:29 PM  Hepatic Function  Total Protein 6.5 - 8.1 g/dL 6.7  6.2  6.2   Albumin 3.5 - 5.0 g/dL 4.0  3.7  3.7   AST 15 - 41 U/L 25  27  30    ALT 0 - 44 U/L 30  32  34   Alk Phosphatase 38 - 126 U/L 118  95  102   Total Bilirubin 0.3 - 1.2 mg/dL 1.2  1.5  1.3     Lab Results  Component Value Date/Time   TSH 0.391 03/12/2021 02:42 AM  TSH 1.066 09/13/2020 06:26 AM       Latest Ref Rng & Units 05/03/2022    2:49 PM 03/04/2022   10:56 AM 03/04/2022   10:28 AM  CBC  WBC 4.0 - 10.5 K/uL 5.8   6.7   Hemoglobin 13.0 - 17.0 g/dL 13.5  11.6  12.1   Hematocrit 39.0 - 52.0 % 37.7  34.0  35.0   Platelets 150 - 400 K/uL 185   161     Lab Results  Component Value Date/Time   VITAMINB12 240 09/13/2020 06:26 AM    Clinical ASCVD: Yes  The ASCVD Risk score (Arnett DK, et al., 2019) failed to calculate for the following reasons:   The 2019 ASCVD risk score is only valid for ages 42 to 76   The patient has a prior MI or stroke diagnosis        05/09/2022    1:32 PM 05/03/2021   12:13 PM 10/21/2020   11:57 AM  Depression screen PHQ 2/9  Decreased Interest 0 0 0  Down, Depressed, Hopeless 0 0 0  PHQ - 2  Score 0 0 0     Social History   Tobacco Use  Smoking Status Former   Passive exposure: Never  Smokeless Tobacco Never   BP Readings from Last 3 Encounters:  01/29/23 124/68  11/28/22 130/70  11/17/22 132/64   Pulse Readings from Last 3 Encounters:  11/28/22 64  11/17/22 60  09/27/22 71   Wt Readings from Last 3 Encounters:  11/28/22 155 lb (70.3 kg)  11/17/22 155 lb 10.3 oz (70.6 kg)  09/27/22 154 lb 14.4 oz (70.3 kg)   BMI Readings from Last 3 Encounters:  11/28/22 25.02 kg/m  11/17/22 25.12 kg/m  09/27/22 25.00 kg/m    No Known Allergies  Medications Reviewed Today     Reviewed by Maren Reamer, [place] (Pharmacist) on 01/29/23 at 2146  Med List Status: <None>   Medication Order Taking? Sig Documenting Provider Last Dose Status Informant  acetaminophen (TYLENOL) 325 MG tablet [id number] Yes Take 2 tablets (650 mg total) by mouth every 6 (six) hours as needed. Jeanell Sparrow, DO Taking Active   albuterol (VENTOLIN HFA) 108 (90 Base) MCG/ACT inhaler [id number] Yes Inhale 2 puffs into the lungs 4 (four) times daily as needed for wheezing or shortness of breath. [place], Betty G, Lester Taking Active   atorvastatin (LIPITOR) 40 MG tablet [id number] Yes Take 1 tablet (40 mg total) by mouth daily.  Patient taking differently: Take 40 mg by mouth every evening.   [place], Betty G, Lester Taking Active   cholecalciferol (VITAMIN D) 25 MCG (1000 UNIT) tablet [id number] Yes Take 1,000 Units by mouth at bedtime. Provider, Historical, Lester Taking Active Self  docusate sodium (COLACE) 50 MG capsule [id number] Yes Take 50 mg by mouth 2 (two) times daily. Provider, Historical, Lester Taking Active   ELIQUIS 5 MG TABS tablet [id number] Yes TAKE 1 TABLET BY MOUTH TWICE A DAY [place], Betty G, Lester Taking Active   Ferrous Sulfate (IRON) 325 (65 Fe) MG TABS [id number] Yes Take 1 tablet by mouth at bedtime. Provider, Historical, Lester Taking Active Self  Fluticasone Furoate (ARNUITY ELLIPTA) 200 MCG/ACT AEPB  [id number] Yes Inhale 1 puff into the lungs every morning. [place], Betty G, Lester Taking Active   Magnesium 250 MG TABS [id number] Yes Take 250 mg by mouth every morning. Provider, Historical, Lester Taking Active Self  Multiple Vitamin (MULTIVITAMIN WITH MINERALS) TABS tablet [id number] Yes Take 1 tablet  by mouth every morning. Provider, Historical, Lester Taking Active Self  Omega-3 Fatty Acids (FISH OIL PO) [id number] Yes Take 1 capsule by mouth every morning. Provider, Historical, Lester Taking Active Self  omeprazole (PRILOSEC) 40 MG capsule [id number] Yes TAKE 1 CAPSULE BY MOUTH DAILY BEFORE SUPPER. [place], Betty G, Lester Taking Active   TIMOLOL MALEATE OP [id number] Yes Apply 1 drop to eye in the morning. 1 drop into both eyes every morning Provider, Historical, Lester  Active Self            SDOH:  (Social Determinants of Health) assessments and interventions performed: Yes SDOH Interventions    [place] Coordination from 01/29/2023 in [place] Telephone from 11/21/2022 in [place] Interventions    Food Insecurity Interventions Intervention Not Indicated Intervention Not Indicated  Housing Interventions Intervention Not Indicated --  Transportation Interventions -- Intervention Not Indicated       Medication Assistance: None required.  Patient affirms current coverage meets needs.  Medication Access: Within the past 30 days, how often has patient missed a dose of medication? None Is a pillbox or other method used to improve adherence? Yes  Factors that may affect medication adherence? no barriers identified Are meds synced by current pharmacy? No  Are meds delivered by current pharmacy? No  Does patient experience delays in picking up medications due to transportation concerns? No   [facility] Services Reviewed: Is patient disadvantaged to use [facility]?: Yes  Current Rx insurance plan: Eulis Foster Name and location of  Current pharmacy:  [id number] - [place], [address] [place] [address] Phone: [phone] Fax: [phone]  [place], [address] [place] [address] Phone: [phone] Fax: [phone]  [facility] services reviewed with patient today?: No  Patient requests to transfer care to [facility]?: No  Reason patient declined to change pharmacies: Disadvantaged due to insurance/mail order  Compliance/Adherence/Medication fill history: Care Gaps: AWV - completed 05/09/2022  Last BP - 130/70 on 11/28/2022 Covid - overdue Flu - postponed  Star-Rating Drugs: Atorvastatin 40mg  PDC 99%   Assessment/Plan   Hypertension (BP goal <130/80) -Not ideally controlled -Current treatment: None -Medications previously tried: Metoprolol  -Current home readings: checks around 11:30 each evening, takes his meds in the morning  01/28/23 141/66 HR 64  01/27/23 139/71 HR 61  01/26/23 141/79 HR 69  01/25/23 141/65 HR 60  01/23/23 143/76 HR 63 Range in March: 126-161/65-81 HR 60s -Current dietary habits: mindful of salt intake -Current exercise habits: Walks at least one mile daily with assistance of walker -Denies hypotensive/hypertensive symptoms -Educated on BP goals and benefits of medications for prevention of heart attack, stroke and kidney damage; Daily salt intake goal < 2300 mg; Importance of home blood pressure monitoring; Proper BP monitoring technique; -Counseled to monitor BP at home twice daily for the next 1-2 weeks, document, and provide log at future appointments -Counseled on diet and exercise extensively -May need to consider addition of low dose BP med (lisinopril 2.5mg ) if BP is still elevated over the next week.  Would need updated CMP to get baseline before starting  Atrial Fibrillation (Goal: prevent  stroke and major bleeding) -Controlled -CHADSVASC: 7 -Current treatment: Rate control: None Anticoagulation: Eliquis 5mg  BID Appropriate, Effective, Safe, Accessible -Medications previously tried: Metoprolol -Home BP and HR readings: see above  -  Counseled on increased risk of stroke due to Afib and benefits of anticoagulation for stroke prevention; importance of adherence to anticoagulant exactly as prescribed; bleeding risk associated with Eliquis and importance of self-monitoring for signs/symptoms of bleeding; avoidance of NSAIDs due to increased bleeding risk with anticoagulants; importance of regular laboratory monitoring; seeking medical attention after a head injury or if there is blood in the urine/stool; -Recommended to continue current medication  Heart Failure (Goal: manage symptoms and prevent exacerbations) -Controlled -Last ejection fraction: 60-65% (Date: 03/13/21) -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%) -NYHA Class: II (slight limitation of activity) -AHA HF Stage: C (Heart disease and symptoms present) -Current treatment: None -Medications previously tried: Metoprolol, Lisinopril (never started) -Current home BP/HR readings: see above -Current home daily weights: not discussed -Current dietary habits: see above -Current exercise habits: see above -Educated on Benefits of medications for managing symptoms and prolonging life Importance of blood pressure control -Recommended to continue current medication  Asthma (Goal: control symptoms and prevent exacerbations) -Not assessed today -Current treatment  Albuterol HFA prn Appropriate, Effective, Safe, Accessible Arnuity 231mcg 1 puff qam Appropriate, Effective, Safe, Accessible  Query GERD  -Not assessed today -Current treatment  Omeprazole 40mg  1 qd Appropriate, Effective, Safe, Accessible   OTC  -Current treatment  Acetaminophen 325mg  Appropriate, Effective, Safe, Accessible Vitamin D 1000 units qd Appropriate,  Effective, Safe, Accessible Docusate prn Appropriate, Effective, Safe, Accessible Ferrous Sulfate 325mg  Appropriate, Effective, Safe, Accessible Magnesium 250mg  Appropriate, Effective, Safe, Accessible   Maren Reamer Clinical Pharmacist [phone]

## 2023-01-29 ENCOUNTER — Ambulatory Visit: Payer: Medicare Other

## 2023-01-29 NOTE — Progress Notes (Signed)
Remote pacemaker transmission.   

## 2023-01-30 ENCOUNTER — Telehealth: Payer: Self-pay

## 2023-01-30 DIAGNOSIS — J4520 Mild intermittent asthma, uncomplicated: Secondary | ICD-10-CM

## 2023-01-30 MED ORDER — ALBUTEROL SULFATE HFA 108 (90 BASE) MCG/ACT IN AERS: 2.0000 | INHALATION_SPRAY | Freq: Four times a day (QID) | RESPIRATORY_TRACT | 1 refills | Status: DC | PRN

## 2023-01-30 NOTE — Telephone Encounter (Signed)
-----   Message from Maren Reamer, [facility] sent at 01/29/2023  9:47 PM EDT ----- Regarding: Med Refill Pt requesting refill on his Albuterol HFA Inhale 2 puffs into the lungs 4 (four) times daily as needed for wheezing or shortness of breath.   [id number] [place], [place] [address] AT [address]  Phone: [phone] Fax: [phone]   Thank you,  Maren Reamer Clinical Pharmacist [phone]

## 2023-01-31 ENCOUNTER — Telehealth: Payer: Self-pay

## 2023-01-31 NOTE — Telephone Encounter (Signed)
Pt is concerned about neuropathy issues affecting his balance and wants his scoliosis evaluted, needs updated bloodwork (CMP, lipid panel).   Made PCP aware and wants a f/u visit in office with patient.  Contacted patient and had to leave a message to make them aware to call back and schedule a visit with PCP.  [place] Pharmacist [phone]

## 2023-02-02 ENCOUNTER — Emergency Department (HOSPITAL_BASED_OUTPATIENT_CLINIC_OR_DEPARTMENT_OTHER)
Admission: EM | Admit: 2023-02-02 | Discharge: 2023-02-02 | Disposition: A | Discharge status: Home / Self Care | Payer: Medicare Other | Attending: Emergency Medicine | Admitting: Emergency Medicine

## 2023-02-02 ENCOUNTER — Telehealth: Payer: Self-pay

## 2023-02-02 ENCOUNTER — Other Ambulatory Visit: Payer: Self-pay

## 2023-02-02 ENCOUNTER — Emergency Department (HOSPITAL_BASED_OUTPATIENT_CLINIC_OR_DEPARTMENT_OTHER): Payer: Medicare Other

## 2023-02-02 DIAGNOSIS — Z859 Personal history of malignant neoplasm, unspecified: Secondary | ICD-10-CM | POA: Insufficient documentation

## 2023-02-02 DIAGNOSIS — Z1152 Encounter for screening for COVID-19: Secondary | ICD-10-CM | POA: Diagnosis not present

## 2023-02-02 DIAGNOSIS — J101 Influenza due to other identified influenza virus with other respiratory manifestations: Secondary | ICD-10-CM | POA: Insufficient documentation

## 2023-02-02 DIAGNOSIS — R001 Bradycardia, unspecified: Secondary | ICD-10-CM | POA: Diagnosis not present

## 2023-02-02 DIAGNOSIS — Z7901 Long term (current) use of anticoagulants: Secondary | ICD-10-CM | POA: Diagnosis not present

## 2023-02-02 DIAGNOSIS — R509 Fever, unspecified: Secondary | ICD-10-CM | POA: Diagnosis present

## 2023-02-02 LAB — RESP PANEL BY RT-PCR (RSV, FLU A&B, COVID)  RVPGX2
Influenza A by PCR: POSITIVE — AB
Influenza B by PCR: NEGATIVE
Resp Syncytial Virus by PCR: NEGATIVE
SARS Coronavirus 2 by RT PCR: NEGATIVE

## 2023-02-02 NOTE — ED Provider Notes (Signed)
[facility] Provider Note   CSN: [id number] Arrival date & time: 02/02/23  1749     History  Chief Complaint  Patient presents with   Fever    GEARALD BOCKUS is a 82 y.o. male presented for 2 days of cough, fever, chills, generalized weakness.  Patient was under [facility] retirement home and states other residents are sick.  Patient did not denies fever gone but did take Tylenol today and his wife said it helped.  Patient been tolerating secretions and taking fluids without issue.  Patient stated he is on Eliquis.  Patient had chest pain, abdominal pain, nausea vomiting, recent surgery/hospitalization/travel, leg swelling, hemoptysis, history of cancer  Home Medications Prior to Admission medications   Medication Sig Start Date End Date Taking? Authorizing Provider  acetaminophen (TYLENOL) 325 MG tablet Take 2 tablets (650 mg total) by mouth every 6 (six) hours as needed. 03/04/22   Jeanell Sparrow, DO  albuterol (VENTOLIN HFA) 108 (90 Base) MCG/ACT inhaler Inhale 2 puffs into the lungs 4 (four) times daily as needed for wheezing or shortness of breath. 01/30/23   [place], Betty G, MD  atorvastatin (LIPITOR) 40 MG tablet Take 1 tablet (40 mg total) by mouth daily. Patient taking differently: Take 40 mg by mouth every evening. 04/17/22   [place], Betty G, MD  cholecalciferol (VITAMIN D) 25 MCG (1000 UNIT) tablet Take 1,000 Units by mouth at bedtime.    [provider]  docusate sodium (COLACE) 50 MG capsule Take 50 mg by mouth 2 (two) times daily.    [provider]  ELIQUIS 5 MG TABS tablet TAKE 1 TABLET BY MOUTH TWICE A DAY 01/19/23   [place], Betty G, MD  Ferrous Sulfate (IRON) 325 (65 Fe) MG TABS Take 1 tablet by mouth at bedtime.    [provider]  Fluticasone Furoate (ARNUITY ELLIPTA) 200 MCG/ACT AEPB Inhale 1 puff into the lungs every morning. 07/21/22   [place], Betty G, MD  Magnesium 250 MG TABS Take 250 mg by mouth every  morning.    [provider]  Multiple Vitamin (MULTIVITAMIN WITH MINERALS) TABS tablet Take 1 tablet by mouth every morning.    [provider]  Omega-3 Fatty Acids (FISH OIL PO) Take 1 capsule by mouth every morning.    [provider]  omeprazole (PRILOSEC) 40 MG capsule TAKE 1 CAPSULE BY MOUTH DAILY BEFORE SUPPER. 01/19/23   [place], Betty G, MD  TIMOLOL MALEATE OP Apply 1 drop to eye in the morning. 1 drop into both eyes every morning    [provider]      Allergies    Patient has no known allergies.    Review of Systems   Review of Systems  Constitutional:  Positive for fever.  See HPI  Physical Exam Updated Vital Signs BP 125/63 (BP Location: Right Arm)   Pulse (!) 59   Temp 98.5 F (36.9 C)   Resp 16   SpO2 94%  Physical Exam Vitals reviewed.  Constitutional:      General: He is not in acute distress. HENT:     Head: Normocephalic and atraumatic.  Eyes:     Extraocular Movements: Extraocular movements intact.     Conjunctiva/sclera: Conjunctivae normal.     Pupils: Pupils are equal, round, and reactive to light.  Cardiovascular:     Rate and Rhythm: Regular rhythm. Bradycardia present.     Pulses: Normal pulses.     Heart sounds: Normal  heart sounds.     Comments: 2+ bilateral radial/dorsalis pedis pulses with regular rate Pulmonary:     Effort: Pulmonary effort is normal. No respiratory distress.     Breath sounds: Normal breath sounds.  Abdominal:     Palpations: Abdomen is soft.     Tenderness: There is no abdominal tenderness. There is no guarding or rebound.  Musculoskeletal:        General: Normal range of motion.     Cervical back: Normal range of motion and neck supple.     Comments: 5 out of 5 bilateral grip/leg extension strength  Skin:    General: Skin is warm and dry.     Capillary Refill: Capillary refill takes less than 2 seconds.  Neurological:     General: No focal deficit present.     Mental Status: He is  alert and oriented to person, place, and time.     Comments: Sensation intact in all 4 limbs  Psychiatric:        Mood and Affect: Mood normal.     ED Results / Procedures / Treatments   Labs (all labs ordered are listed, but only abnormal results are displayed) Labs Reviewed  RESP PANEL BY RT-PCR (RSV, FLU A&B, COVID)  [id number] - Abnormal; Notable for the following components:      Result Value   Influenza A by PCR POSITIVE (*)    All other components within normal limits    EKG None  Radiology DG Chest Port 1 View  Result Date: 02/02/2023 CLINICAL DATA:  Cough and fever for 2-3 days. EXAM: PORTABLE CHEST 1 VIEW COMPARISON:  X-ray 05/03/2022 and older FINDINGS: Normal cardiopericardial silhouette with a calcified aorta. No consolidation, pneumothorax, effusion or edema. Apical pleural thickening. Left upper chest dual lead pacemaker. Double density overlying the heart may represent a hiatal hernia. IMPRESSION: Pacemaker.  Hiatal hernia. No acute cardiopulmonary disease Electronically Signed   By: Jill Side M.D.   On: 02/02/2023 18:54    Procedures Procedures    Medications Ordered in ED Medications - No data to display  ED Course/ Medical Decision Making/ A&P                             Medical Decision Making Amount and/or Complexity of Data Reviewed Radiology: ordered.   Theodoro Clock 82 y.o. presented today for URI like symptoms. Working DDx that I considered at this time includes, but not limited to, viral illness, peritonsillar abscess, retropharyngeal abscess, pneumonia, meningitis.  R/o DDx: peritonsillar abscess, retropharyngeal abscess, pneumonia, meningitis: Patient presentation, physical exam, imaging/labs were make these unlikely  Review of prior external notes: 11/17/22 ED  Unique Tests and My Interpretation:  Respiratory panel: Influenza A positive Chest x-ray negative for acute changes  Discussion with Independent Historian: Wife  Discussion of  Management of Tests: None  Risk: Low:  - based on diagnostic testing/clinical impression and treatment plan  Risk Stratification Score: None  Staffed with Tegeler, MD  Plan: Patient presented for shortness of breath.  On exam patient not appear in distress and had stable vitals.  Patient unremarkable physical exam so respiratory panel was ordered to evaluate for possible viral illness.  Respiratory panel came back positive for influenza A.  Given patient's history of AKI's and lower kidney function Tamiflu not be ordered and I spoke with the patient about continue to take Tylenol and drinking fluids and monitoring symptoms and to follow-up with primary  care provider in the next few days to be reevaluated if symptoms do not improve.  Patient be discharged as he is not requiring any oxygen and has stable vitals at this time.  Patient was given return precautions.patient stable for discharge at this time.  Patient verbalized understanding of plan.         Final Clinical Impression(s) / ED Diagnoses Final diagnoses:  Influenza A    Rx / DC Orders ED Discharge Orders     None         Elvina Sidle 02/02/23 1910    Tegeler, Gwenyth Allegra, MD 02/02/23 [phone]

## 2023-02-02 NOTE — Discharge Instructions (Signed)
Please continue to take Tylenol every 6 hours as needed for symptoms.  Please continue taking plenty of fluids to avoid dehydration.  If symptoms persist please make an appointment with your primary care provider to be seen in the next few days.  If symptoms worsen please return to ER.

## 2023-02-02 NOTE — Telephone Encounter (Signed)
Pt's wife called to report pt has a 101 fever, cold-like symptoms and close exposure to flu. Office had just closed for the day so unable to have patient come here to performing testing.  Recommend pt go to Urgent care for testing/treatment. Will call patient Monday morning to see how he is doing.  Will make PCP aware.  [place] Pharmacist [phone]

## 2023-02-02 NOTE — ED Triage Notes (Signed)
Pt reports fever and nasal congestion. Pt reports exposure to flu.

## 2023-02-05 ENCOUNTER — Telehealth: Payer: Self-pay

## 2023-02-05 NOTE — Progress Notes (Signed)
ACUTE VISIT Chief Complaint  Patient presents with   Medical Management of Chronic Issues   HPI: [name] is a 82 y.o. male, who is here today  for follow up. He was last seen on 03/13/2022 for ED follow-up. Since his last visit he has followed with his cardiologist and vascular. Evaluated in the ED on 02/02/2023, when he was diagnosed with influenza A.  HTN on non pharmacologic treatment. He was on Lisinopril. He has had some SBP's 150-160's. Negative for severe/frequent headache, visual changes, chest Lester, dyspnea, palpitation, focal weakness, or edema. He resides in an independent living facility and has concerns about the inability to control salt intake in the meals provided, which is impacting blood pressure management.  Lab Results  Component Value Date   CREATININE 0.97 02/06/2023   BUN 23 02/06/2023   NA 135 02/06/2023   K 4.5 02/06/2023   CL 102 02/06/2023   CO2 27 02/06/2023   PAF: Follows with cardiologist, currently he is on Eliquis 5 mg twice daily. HFpEF, negative for orthopnea and PND. Last echo 03/2021: LVEF 60-65% and left ventricular diastolic parameters indeterminate  S/P pacemaker placement. Follows with cardiologist annually.  Recurrent TIA: He follows with neurologist and vascular, bilateral carotid artery stenosis s/p carotid endarterectomy on 09/16/2020. Hyperlipidemia on atorvastatin 40 mg daily. Tolerating medication well. Aortic atherosclerosis seen on CT angio neck in 02/2021. Lab Results  Component Value Date   CHOL 78 03/12/2021   HDL 33 (L) 03/12/2021   LDLCALC 31 03/12/2021   TRIG 68 03/12/2021   CHOLHDL 2.4 03/12/2021   Glucose has been elevated a few times in the past, 130's, no hx of diabetes. Negative for polydipsia,polyuria, or polyphagia.  Lab Results  Component Value Date   HGBA1C 5.6 03/12/2021   He was seeing ortho to follow on scoliosis , he has not established with new provider since he moved, wonders if he still  needs to follow with specialist. States that back Lester is not worse, it is "manageable". Lower back Lester with extended standing or activity, but does not cause significant discomfort otherwise. He continues to engage in carpentry, modifying activities to accommodate physical limitations.  -Peripheral neuropathy: Reports persistent symptoms in feet, with a sensation similar to wearing socks and occasional cramping.  New shoes have been purchased to fit foot deformities, including hammer toe, and the use of orthotic inserts have help with balance and foot positioning.  He uses a walker to help with gait. Has done PT to help with balance and fall prevention, still has had a few falls in the past year. He walks for 30 minutes five to six times a week.   Asthma: He has not had a recent exacerbation of asthma. He is on Albuterol inh and Arnuity Ellipta 200 mcg 1 puff daily.   Iron def anemia on Fe sulfate 325 mg daily.  GERD on Omeprazole 40 mg daily. Negative for abdominal Lester, nausea, vomiting, changes in bowel habits, blood in stool or melena.  Review of Systems  Constitutional:  Negative for chills and fever.  Respiratory:  Negative for cough and wheezing.   Endocrine: Negative for cold intolerance and heat intolerance.  Genitourinary:  Negative for decreased urine volume, dysuria and hematuria.  Skin:  Negative for rash and wound.  Neurological:  Negative for syncope and facial asymmetry.  See other pertinent positives and negatives in HPI.  Current Outpatient Medications on File Prior to Visit  Medication Sig Dispense Refill   acetaminophen (TYLENOL)  325 MG tablet Take 2 tablets (650 mg total) by mouth every 6 (six) hours as needed. 36 tablet 0   albuterol (VENTOLIN HFA) 108 (90 Base) MCG/ACT inhaler Inhale 2 puffs into the lungs 4 (four) times daily as needed for wheezing or shortness of breath. 8 g 1   atorvastatin (LIPITOR) 40 MG tablet Take 40 mg by mouth every evening.      cholecalciferol (VITAMIN D) 25 MCG (1000 UNIT) tablet Take 1,000 Units by mouth at bedtime.     docusate sodium (COLACE) 50 MG capsule Take 50 mg by mouth 2 (two) times daily.     ELIQUIS 5 MG TABS tablet TAKE 1 TABLET BY MOUTH TWICE A DAY 180 tablet 1   Ferrous Sulfate (IRON) 325 (65 Fe) MG TABS Take 1 tablet by mouth at bedtime.     Fluticasone Furoate (ARNUITY ELLIPTA) 200 MCG/ACT AEPB Inhale 1 puff into the lungs every morning. 90 each 3   Magnesium 250 MG TABS Take 250 mg by mouth every morning.     Multiple Vitamin (MULTIVITAMIN WITH MINERALS) TABS tablet Take 1 tablet by mouth every morning.     Omega-3 Fatty Acids (FISH OIL PO) Take 1 capsule by mouth every morning.     omeprazole (PRILOSEC) 40 MG capsule TAKE 1 CAPSULE BY MOUTH DAILY BEFORE SUPPER. 90 capsule 2   TIMOLOL MALEATE OP Apply 1 drop to eye in the morning. 1 drop into both eyes every morning     No current facility-administered medications on file prior to visit.   Past Medical History:  Diagnosis Date   Carotid artery occlusion    GERD (gastroesophageal reflux disease)    Hypertension    Stroke ([place]) 02/2021   TIA   No Known Allergies  Social History   Socioeconomic History   Marital status: Married    Spouse name: [name]   Number of children: Not on file   Years of education: Not on file   Highest education level: Associate degree: academic program  Occupational History   Not on file  Tobacco Use   Smoking status: Former    Passive exposure: Never   Smokeless tobacco: Never  Vaping Use   Vaping Use: Never used  Substance and Sexual Activity   Alcohol use: Yes    Comment: occassional   Drug use: Never   Sexual activity: Not Currently  Other Topics Concern   Not on file  Social History Narrative   Lives with wife   Social Determinants of Health   Financial Resource Strain: Low Risk  (02/04/2023)   Overall Financial Resource Strain (CARDIA)    Difficulty of Paying Living Expenses: Not hard at all   Food Insecurity: No Food Insecurity (02/04/2023)   Hunger Vital Sign    Worried About Running Out of Food in the Last Year: Never true    [place] in the Last Year: Never true  Transportation Needs: No Transportation Needs (02/04/2023)   PRAPARE - Hydrologist (Medical): No    Lack of Transportation (Non-Medical): No  Physical Activity: Sufficiently Active (02/04/2023)   Exercise Vital Sign    Days of Exercise per Week: 5 days    Minutes of Exercise per Session: 30 min  Stress: No Stress Concern Present (02/04/2023)   [place]    Feeling of Stress : Not at all  Social Connections: Unknown (02/04/2023)   Social Connection and Isolation Panel [NHANES]  Frequency of Communication with Friends and Family: Three times a week    Frequency of Social Gatherings with Friends and Family: Twice a week    Attends Religious Services: Patient declined    Marine scientist or Organizations: No    Attends Music therapist: Not on file    Marital Status: Married   Vitals:   02/06/23 1357  BP: 120/70  Pulse: 70  Resp: 16  Temp: 98.3 F (36.8 C)  SpO2: 95%   Body mass index is 25.02 kg/m.  Physical Exam Vitals and nursing note reviewed.  Constitutional:      General: He is not in acute distress.    Appearance: He is well-developed.  HENT:     Head: Normocephalic and atraumatic.  Eyes:     Conjunctiva/sclera: Conjunctivae normal.  Cardiovascular:     Rate and Rhythm: Normal rate and regular rhythm.     Pulses:          Dorsalis pedis pulses are 2+ on the right side and 2+ on the left side.     Heart sounds: No murmur heard. Pulmonary:     Effort: Pulmonary effort is normal. No respiratory distress.     Breath sounds: Normal breath sounds.  Abdominal:     Palpations: Abdomen is soft. There is no mass.     Tenderness: There is no abdominal tenderness.   Musculoskeletal:     Right foot: Decreased range of motion. No bony tenderness.     Left foot: Decreased range of motion. No bony tenderness.     Comments: Pronounce arch and hammer toes bilateral.  Lymphadenopathy:     Cervical: No cervical adenopathy.  Skin:    General: Skin is warm.     Findings: No erythema or rash.  Neurological:     Mental Status: He is alert and oriented to person, place, and time.     Gait: Gait abnormal.     Comments: Monofilament normal. Gait is unstable, assisted with a walker.  Psychiatric:        Mood and Affect: Mood and affect normal.   ASSESSMENT AND PLAN:  [name] was seen today for follow up.  Lab Results  Component Value Date   HGBA1C 5.8 02/06/2023   Lab Results  Component Value Date   CREATININE 0.97 02/06/2023   BUN 23 02/06/2023   NA 135 02/06/2023   K 4.5 02/06/2023   CL 102 02/06/2023   CO2 27 02/06/2023   Lab Results  Component Value Date   WBC 5.1 02/06/2023   HGB 13.1 02/06/2023   HCT 37.5 (L) 02/06/2023   MCV 97.8 02/06/2023   PLT 140.0 (L) 02/06/2023   Prediabetes Assessment & Plan: HgA1C 5.8. Consistency with a healthy life style for diabetes prevention to continue.  Orders: -     Hemoglobin A1c; Future  Chronic heart failure with preserved ejection fraction (HCC) Assessment & Plan: Euvolemic. Low salt diet recommended. Follows with cardiologist.   Atherosclerosis of aorta [facility]) Assessment & Plan: Continue Atorvastatin 40 mg daily. On chronic anticoagulation. Last LDL 31 in 02/2021.   PAF (paroxysmal atrial fibrillation) (HCC) Assessment & Plan: Rate and rhythm controlled. Continue Eliquis 5 mg bid. Follows with cardiologist.   Other iron deficiency anemia Assessment & Plan: Continue Fe sulfate 325 mg daily. Further recommendations according to cbc result.  Orders: -     CBC; Future  Essential hypertension Assessment & Plan: Her today BP is adequate but some SBP's at home  150-160's. He agrees with trying Amlodipine 2.5 mg daily. Low salt diet is also recommended but hard for him to control because food is prepared for him. Continue monitoring BP regularly. F/U in 6 months.   Orders: -     amLODIPine Besylate; Take 1 tablet (2.5 mg total) by mouth daily.  Dispense: 90 tablet; Refill: 1 -     Basic metabolic panel; Future  Gastroesophageal reflux disease, unspecified whether esophagitis present Assessment & Plan: Problem is stable. Continue Omeprazole 40 mg daily and GERD precautions.   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 As far as problem is stable, I do not think he needs to see ortho. Tylenol 500 mg 3-4 times per day can be used for Lester management as needed.   I spent a total of 52 minutes in both face to face and non face to face activities for this visit on the date of this encounter. During this time history was obtained and documented, examination was performed, prior labs/imaging reviewed, and assessment/plan discussed.  Return in about 6 months (around 08/09/2023).  Tylee Yum G. [place], MD  [facility]. [place] office.

## 2023-02-05 NOTE — Progress Notes (Signed)
Care Management & Coordination Services Pharmacy Team  Reason for Encounter: Recent blood pressure readings   Called patient to follow up on home blood pressure readings.  Date  BP AM  Pulse  BP PM  Pulse 01/30/23 120/70  64  151/76  60 01/31/23 140/78  74  140/70  61 02/01/23 132/72  63  139/65  60 02/02/23 129/69  61  139/72  62 02/03/23 138/67  63  125/64  60 02/04/23 126/70  62  147/73  60 02/05/23 132/75  62  Recent vitals BP Readings from Last 3 Encounters:  02/02/23 124/62  01/29/23 124/68  11/28/22 130/70   Pulse Readings from Last 3 Encounters:  02/02/23 60  11/28/22 64  11/17/22 [place] Pharmacist Assistant [phone]

## 2023-02-06 ENCOUNTER — Ambulatory Visit (INDEPENDENT_AMBULATORY_CARE_PROVIDER_SITE_OTHER): Payer: Medicare Other | Admitting: Family Medicine

## 2023-02-06 ENCOUNTER — Encounter: Payer: Self-pay | Admitting: Family Medicine

## 2023-02-06 VITALS — BP 120/70 | HR 70 | Temp 98.3°F | Resp 16 | Ht 66.0 in | Wt 155.0 lb

## 2023-02-06 DIAGNOSIS — I10 Essential (primary) hypertension: Secondary | ICD-10-CM | POA: Diagnosis not present

## 2023-02-06 DIAGNOSIS — R7303 Prediabetes: Secondary | ICD-10-CM | POA: Diagnosis not present

## 2023-02-06 DIAGNOSIS — I480 Paroxysmal atrial fibrillation: Secondary | ICD-10-CM

## 2023-02-06 DIAGNOSIS — I700 Atherosclerosis of aorta: Secondary | ICD-10-CM | POA: Diagnosis not present

## 2023-02-06 DIAGNOSIS — D508 Other iron deficiency anemias: Secondary | ICD-10-CM

## 2023-02-06 DIAGNOSIS — I5032 Chronic diastolic (congestive) heart failure: Secondary | ICD-10-CM | POA: Diagnosis not present

## 2023-02-06 DIAGNOSIS — G8929 Other chronic pain: Secondary | ICD-10-CM

## 2023-02-06 DIAGNOSIS — K219 Gastro-esophageal reflux disease without esophagitis: Secondary | ICD-10-CM | POA: Insufficient documentation

## 2023-02-06 DIAGNOSIS — R7309 Other abnormal glucose: Secondary | ICD-10-CM

## 2023-02-06 DIAGNOSIS — M545 Low back pain, unspecified: Secondary | ICD-10-CM

## 2023-02-06 LAB — CBC
HCT: 37.5 % — ABNORMAL LOW (ref 39.0–52.0)
Hemoglobin: 13.1 g/dL (ref 13.0–17.0)
MCHC: 34.8 g/dL (ref 30.0–36.0)
MCV: 97.8 fl (ref 78.0–100.0)
Platelets: 140 10*3/uL — ABNORMAL LOW (ref 150.0–400.0)
RBC: 3.84 Mil/uL — ABNORMAL LOW (ref 4.22–5.81)
RDW: 13.6 % (ref 11.5–15.5)
WBC: 5.1 10*3/uL (ref 4.0–10.5)

## 2023-02-06 LAB — BASIC METABOLIC PANEL
BUN: 23 mg/dL (ref 6–23)
CO2: 27 mEq/L (ref 19–32)
Calcium: 8.4 mg/dL (ref 8.4–10.5)
Chloride: 102 mEq/L (ref 96–112)
Creatinine, Ser: 0.97 mg/dL (ref 0.40–1.50)
GFR: 72.71 mL/min (ref >60.00)
Glucose, Bld: 93 mg/dL (ref 70–99)
Potassium: 4.5 mEq/L (ref 3.5–5.1)
Sodium: 135 mEq/L (ref 135–145)

## 2023-02-06 LAB — HEMOGLOBIN A1C: Hgb A1c MFr Bld: 5.8 % (ref 4.6–6.5)

## 2023-02-06 MED ORDER — AMLODIPINE BESYLATE 2.5 MG PO TABS: 2.5000 mg | ORAL_TABLET | Freq: Every day | ORAL | 1 refills | Status: DC

## 2023-02-06 NOTE — Patient Instructions (Addendum)
A few things to remember from today's visit:  PAF (paroxysmal atrial fibrillation) (HCC)  Chronic heart failure with preserved ejection fraction (HCC), Chronic  Atherosclerosis of aorta (HCC), Chronic  Elevated glucose level - Plan: Hemoglobin A1c  Other iron deficiency anemia - Plan: CBC  Essential hypertension - Plan: amLODipine (NORVASC) 2.5 MG tablet, Basic metabolic panel  Because some blood pressure high 150's and 160,, Amlodipine 2.5 mg started. Goal is under 150/90. Continue monitoring blood pressures.  If you need refills for medications you take chronically, please call your pharmacy. Do not use My Chart to request refills or for acute issues that need immediate attention. If you send a my chart message, it may take a few days to be addressed, specially if I am not in the office.  Please be sure medication list is accurate. If a new problem present, please set up appointment sooner than planned today.

## 2023-02-09 NOTE — Assessment & Plan Note (Signed)
Problem is stable. Continue Omeprazole 40 mg daily and GERD precautions.

## 2023-02-09 NOTE — Assessment & Plan Note (Signed)
Continue Fe sulfate 325 mg daily. Further recommendations according to cbc result.

## 2023-02-09 NOTE — Assessment & Plan Note (Signed)
Rate and rhythm controlled. Continue Eliquis 5 mg bid. Follows with cardiologist.

## 2023-02-09 NOTE — Assessment & Plan Note (Signed)
Continue Atorvastatin 40 mg daily. On chronic anticoagulation. Last LDL 31 in 02/2021.

## 2023-02-09 NOTE — Assessment & Plan Note (Signed)
He reports that pain is not severe and he is able to modify activities through the day to avoid aggravating problem. As far as problem is stable, I do not think he needs to see ortho. Tylenol 500 mg 3-4 times per day can be used for pain management as needed.

## 2023-02-09 NOTE — Assessment & Plan Note (Signed)
HgA1C 5.8. Consistency with a healthy life style for diabetes prevention to continue.

## 2023-02-09 NOTE — Assessment & Plan Note (Signed)
Euvolemic. Low salt diet recommended. Follows with cardiologist.

## 2023-02-09 NOTE — Assessment & Plan Note (Signed)
Her today BP is adequate but some SBP's at home 150-160's. He agrees with trying Amlodipine 2.5 mg daily. Low salt diet is also recommended but hard for him to control because food is prepared for him. Continue monitoring BP regularly. F/U in 6 months.

## 2023-02-21 ENCOUNTER — Telehealth: Payer: Self-pay

## 2023-02-21 NOTE — Progress Notes (Signed)
Care Management & Coordination Services Pharmacy Team  Reason for Encounter: Hypertension  Contacted patient to discuss hypertension disease state. Spoke with patient on 02/21/2023    Current antihypertensive regimen:  Amlodipine 2.5 mg daily Patient verbally confirms he has not started this medication, he has not received it from mail order, he will be watching for this to come in the mail any day now. He plans to notate on his blood pressure log the day he starts taking this medication.   How often are you checking your Blood Pressure? Patient is checking once every evening.   Current home BP readings:  DATE:             BP               PULSE 02/20/23 143/69  62 02/19/23 128/65  62 02/18/23 139/68  62 02/17/23 132/72  64 02/16/23 131/73  61 02/15/23 154/83  72 02/14/23 143/77  66 02/13/23 146/75  63 02/12/23 131/74  61  Any readings above 180/100? No  What recent interventions/DTPs have been made by any provider to improve Blood Pressure control since last CPP Visit: START checking BP TWICE daily for next 1-2 weeks and keep a log to assess BP control at home. Started Amlodipine 2.5 mg daily.  Any recent hospitalizations or ED visits since last visit with CPP? Patient was seen at [facility] on 02/02/2023 (1 hour) due to Influenza A.   What diet changes have been made to improve Blood Pressure Control?  Patient follows low sodium Breakfast - cereal with a fruit or toast and egg Lunch - sandwich Dinner - meats and vegetables Caffeine - 1 coffee in the AM and a tea or Coke in the afternoon Salt intake - mindful of salt intake  What exercise is being done to improve your Blood Pressure Control?  Walks at least one mile daily with assistance of walker   Adherence Review: Is the patient currently on ACE/ARB medication? No Does the patient have >5 day gap between last estimated fill dates? No  Care Gaps: AWV - completed 05/09/2022  Last BP - 130/70 on  11/28/2022 Covid - overdue Flu - postponed   Star Rating Drugs:  Atorvastatin 40 mg - last filled [facility]/13/2023 [facility]  Chart Updates: Recent office visits:  02/06/2023 Betty [place] MD - Patient was seen for prediabetes and additional concerns. Started Amlodipine 2.5 mg daily.   Recent consult visits:  None  Hospital visits:  Patient was seen at [facility] on 02/02/2023 (1 hour) due to Influenza A.   New?Medications Started at [facility] Discharge:?? None Medication Changes at Hospital Discharge: None Medications Discontinued at Hospital Discharge: None Medications that remain the same after Hospital Discharge:??  -All other medications will remain the same.    Medications: Outpatient Encounter Medications as of 02/21/2023  Medication Sig   acetaminophen (TYLENOL) 325 MG tablet Take 2 tablets (650 mg total) by mouth every 6 (six) hours as needed.   albuterol (VENTOLIN HFA) 108 (90 Base) MCG/ACT inhaler Inhale 2 puffs into the lungs 4 (four) times daily as needed for wheezing or shortness of breath.   amLODipine (NORVASC) 2.5 MG tablet Take 1 tablet (2.5 mg total) by mouth daily.   atorvastatin (LIPITOR) 40 MG tablet Take 40 mg by mouth every evening.   cholecalciferol (VITAMIN D) 25 MCG (1000 UNIT) tablet Take 1,000 Units by mouth at bedtime.   docusate sodium (COLACE) 50 MG capsule Take 50 mg  by mouth 2 (two) times daily.   ELIQUIS 5 MG TABS tablet TAKE 1 TABLET BY MOUTH TWICE A DAY   Ferrous Sulfate (IRON) 325 (65 Fe) MG TABS Take 1 tablet by mouth at bedtime.   Fluticasone Furoate (ARNUITY ELLIPTA) 200 MCG/ACT AEPB Inhale 1 puff into the lungs every morning.   Magnesium 250 MG TABS Take 250 mg by mouth every morning.   Multiple Vitamin (MULTIVITAMIN WITH MINERALS) TABS tablet Take 1 tablet by mouth every morning.   Omega-3 Fatty Acids (FISH OIL PO) Take 1 capsule by mouth every morning.   omeprazole (PRILOSEC) 40 MG capsule TAKE 1 CAPSULE BY MOUTH DAILY  BEFORE SUPPER.   TIMOLOL MALEATE OP Apply 1 drop to eye in the morning. 1 drop into both eyes every morning   No facility-administered encounter medications on file as of 02/21/2023.  Fill History:  Dispensed Days Supply Quantity Provider Pharmacy  ALBUTEROL HFA 90 MCG INHALER 01/30/2023 19 8.5 g      Dispensed Days Supply Quantity Provider Pharmacy  ELIQUIS 5 MG TABLET 02/09/2023 90 180 each      Dispensed Days Supply Quantity Provider Pharmacy  ATORVASTATIN 40 MG TABLET 01/13/2023 90 90 each      Dispensed Days Supply Quantity Provider Pharmacy  ARNUITY ELLIPTA 200 MCG INH 01/19/2023 90 90 each      Dispensed Days Supply Quantity Provider Pharmacy  OMEPRAZOLE DR 40 MG CAPSULE 12/23/2022 90 90 each      Dispensed Days Supply Quantity Provider Pharmacy  TIMOLOL MALEATE 0.5% EYE DROPS 12/14/2022 90 10 mL     Recent Office Vitals: BP Readings from Last 3 Encounters:  02/06/23 120/70  02/02/23 124/62  01/29/23 124/68   Pulse Readings from Last 3 Encounters:  02/06/23 70  02/02/23 60  11/28/22 64    Wt Readings from Last 3 Encounters:  02/06/23 155 lb (70.3 kg)  11/28/22 155 lb (70.3 kg)  11/17/22 155 lb 10.3 oz (70.6 kg)     Kidney Function Lab Results  Component Value Date/Time   CREATININE 0.97 02/06/2023 02:58 PM   CREATININE 1.01 05/03/2022 02:49 PM   GFR 72.71 02/06/2023 02:58 PM   GFRNONAA >60 05/03/2022 02:49 PM       Latest Ref Rng & Units 02/06/2023    2:58 PM 05/03/2022    2:49 PM 03/04/2022   10:56 AM  BMP  Glucose 70 - 99 mg/dL 93  92  790   BUN 6 - 23 mg/dL 23  17  20    Creatinine 0.40 - 1.50 mg/dL 2.40  9.73  5.32   Sodium 135 - 145 mEq/L 135  136  137   Potassium 3.5 - 5.1 mEq/L 4.5  3.9  4.0   Chloride 96 - 112 mEq/L 102  104  101   CO2 19 - 32 mEq/L 27  25    Calcium 8.4 - 10.5 mg/dL 8.4  9.2      Inetta Fermo [facility]  Clinical Pharmacist Assistant [phone]

## 2023-03-27 ENCOUNTER — Other Ambulatory Visit: Payer: Self-pay

## 2023-03-27 ENCOUNTER — Ambulatory Visit (INDEPENDENT_AMBULATORY_CARE_PROVIDER_SITE_OTHER): Payer: Medicare Other

## 2023-03-27 DIAGNOSIS — I4892 Unspecified atrial flutter: Secondary | ICD-10-CM

## 2023-03-27 DIAGNOSIS — I10 Essential (primary) hypertension: Secondary | ICD-10-CM

## 2023-03-27 MED ORDER — AMLODIPINE BESYLATE 2.5 MG PO TABS: 2.5000 mg | ORAL_TABLET | Freq: Every day | ORAL | 1 refills | Status: DC

## 2023-03-28 ENCOUNTER — Telehealth: Payer: Self-pay | Admitting: Family Medicine

## 2023-03-28 DIAGNOSIS — I10 Essential (primary) hypertension: Secondary | ICD-10-CM

## 2023-03-28 LAB — CUP PACEART REMOTE DEVICE CHECK
Battery Remaining Longevity: 37 mo
Battery Remaining Percentage: 30 %
Battery Voltage: 2.93 V
Brady Statistic AP VP Percent: 1.2 %
Brady Statistic AP VS Percent: 97 %
Brady Statistic AS VP Percent: 1 %
Brady Statistic AS VS Percent: 1.6 %
Brady Statistic RA Percent Paced: 97 %
Brady Statistic RV Percent Paced: 1.2 %
Date Time Interrogation Session: 20240514020012
Implantable Lead Connection Status: 753985
Implantable Lead Connection Status: 753985
Implantable Lead Implant Date: 20160928
Implantable Lead Implant Date: 20160928
Implantable Lead Location: 753859
Implantable Lead Location: 753860
Implantable Lead Model: 1948
Implantable Pulse Generator Implant Date: 20160928
Lead Channel Impedance Value: 400 Ohm
Lead Channel Impedance Value: 530 Ohm
Lead Channel Pacing Threshold Amplitude: 0.75 V
Lead Channel Pacing Threshold Amplitude: 1 V
Lead Channel Pacing Threshold Pulse Width: 0.4 ms
Lead Channel Pacing Threshold Pulse Width: 0.4 ms
Lead Channel Sensing Intrinsic Amplitude: 1.8 mV
Lead Channel Sensing Intrinsic Amplitude: 2.5 mV
Lead Channel Setting Pacing Amplitude: 1.25 V
Lead Channel Setting Pacing Amplitude: 1.75 V
Lead Channel Setting Pacing Pulse Width: 0.4 ms
Lead Channel Setting Sensing Sensitivity: 0.7 mV
Pulse Gen Model: 2240
Pulse Gen Serial Number: 7817736

## 2023-03-28 MED ORDER — AMLODIPINE BESYLATE 2.5 MG PO TABS: 2.5000 mg | ORAL_TABLET | Freq: Every day | ORAL | 1 refills | Status: DC

## 2023-03-28 NOTE — Telephone Encounter (Signed)
Pt is calling and does not want amLODipine (NORVASC) 2.5 MG tablet  to go to [facility]. Please send rx to  [id number] - [place], [place] - [address] AT [facility] [address] [facility] [address] Phone: [phone]  Fax: [phone]

## 2023-03-28 NOTE — Telephone Encounter (Signed)
Rx sent 

## 2023-04-18 ENCOUNTER — Other Ambulatory Visit: Payer: Self-pay | Admitting: Family Medicine

## 2023-04-19 NOTE — Telephone Encounter (Signed)
Medication refilled by PCP for one year on 04/17/22 (see phone note 04/17/22) Last OV: 03/13/22

## 2023-04-24 NOTE — Progress Notes (Signed)
Remote pacemaker transmission.   

## 2023-05-11 ENCOUNTER — Ambulatory Visit (INDEPENDENT_AMBULATORY_CARE_PROVIDER_SITE_OTHER): Payer: Medicare Other

## 2023-05-11 VITALS — BP 120/60 | HR 60 | Temp 97.9°F | Ht 66.0 in | Wt 153.1 lb

## 2023-05-11 DIAGNOSIS — Z0000 Encounter for general adult medical examination without abnormal findings: Secondary | ICD-10-CM | POA: Diagnosis not present

## 2023-05-11 NOTE — Progress Notes (Unsigned)
ACUTE VISIT No chief complaint on file.  HPI: [name] is a 82 y.o. male, who is here today complaining of *** HPI  Review of Systems See other pertinent positives and negatives in HPI.  Current Outpatient Medications on File Prior to Visit  Medication Sig Dispense Refill   acetaminophen (TYLENOL) 325 MG tablet Take 2 tablets (650 mg total) by mouth every 6 (six) hours as needed. 36 tablet 0   albuterol (VENTOLIN HFA) 108 (90 Base) MCG/ACT inhaler Inhale 2 puffs into the lungs 4 (four) times daily as needed for wheezing or shortness of breath. 8 g 1   amLODipine (NORVASC) 2.5 MG tablet Take 1 tablet (2.5 mg total) by mouth daily. 90 tablet 1   atorvastatin (LIPITOR) 40 MG tablet Take 1 tablet (40 mg total) by mouth daily. NEED APPT FOR FUTURE REFILLS 30 tablet 0   cholecalciferol (VITAMIN D) 25 MCG (1000 UNIT) tablet Take 1,000 Units by mouth at bedtime.     docusate sodium (COLACE) 50 MG capsule Take 50 mg by mouth 2 (two) times daily.     ELIQUIS 5 MG TABS tablet TAKE 1 TABLET BY MOUTH TWICE A DAY 180 tablet 1   Ferrous Sulfate (IRON) 325 (65 Fe) MG TABS Take 1 tablet by mouth at bedtime.     Fluticasone Furoate (ARNUITY ELLIPTA) 200 MCG/ACT AEPB Inhale 1 puff into the lungs every morning. 90 each 3   Magnesium 250 MG TABS Take 250 mg by mouth every morning.     Multiple Vitamin (MULTIVITAMIN WITH MINERALS) TABS tablet Take 1 tablet by mouth every morning.     Omega-3 Fatty Acids (FISH OIL PO) Take 1 capsule by mouth every morning.     omeprazole (PRILOSEC) 40 MG capsule TAKE 1 CAPSULE BY MOUTH DAILY BEFORE SUPPER. 90 capsule 2   TIMOLOL MALEATE OP Apply 1 drop to eye in the morning. 1 drop into both eyes every morning     No current facility-administered medications on file prior to visit.    Past Medical History:  Diagnosis Date   Carotid artery occlusion    GERD (gastroesophageal reflux disease)    Hypertension    Stroke (HCC) 02/2021   TIA   No Known  Allergies  Social History   Socioeconomic History   Marital status: Married    Spouse name: Garment/textile technologist   Number of children: Not on file   Years of education: Not on file   Highest education level: Associate degree: academic program  Occupational History   Not on file  Tobacco Use   Smoking status: Former    Passive exposure: Never   Smokeless tobacco: Never  Vaping Use   Vaping Use: Never used  Substance and Sexual Activity   Alcohol use: Yes    Comment: occassional   Drug use: Never   Sexual activity: Not Currently  Other Topics Concern   Not on file  Social History Narrative   Lives with wife   Social Determinants of Health   Financial Resource Strain: Low Risk  (05/11/2023)   Overall Financial Resource Strain (CARDIA)    Difficulty of Paying Living Expenses: Not hard at all  Food Insecurity: No Food Insecurity (05/11/2023)   Hunger Vital Sign    Worried About Running Out of Food in the Last Year: Never true    Ran Out of Food in the Last Year: Never true  Transportation Needs: No Transportation Needs (05/11/2023)   PRAPARE - Transportation    Lack of  Transportation (Medical): No    Lack of Transportation (Non-Medical): No  Physical Activity: Sufficiently Active (05/11/2023)   Exercise Vital Sign    Days of Exercise per Week: 5 days    Minutes of Exercise per Session: 30 min  Recent Concern: Physical Activity - Insufficiently Active (05/11/2023)   Exercise Vital Sign    Days of Exercise per Week: 2 days    Minutes of Exercise per Session: 30 min  Stress: No Stress Concern Present (05/11/2023)   [name] of Occupational Health - Occupational Stress Questionnaire    Feeling of Stress : Not at all  Social Connections: Socially Integrated (05/11/2023)   Social Connection and Isolation Panel [NHANES]    Frequency of Communication with Friends and Family: More than three times a week    Frequency of Social Gatherings with Friends and Family: More than three times a week     Attends Religious Services: More than 4 times per year    Active Member of [employer] or Organizations: Yes    Attends Engineer, structural: More than 4 times per year    Marital Status: Married    There were no vitals filed for this visit. There is no height or weight on file to calculate BMI.  Physical Exam  ASSESSMENT AND PLAN: There are no diagnoses linked to this encounter.  No follow-ups on file.  Ramya Vanbergen G. [place], MD  [facility]. [place] office.  Discharge Instructions   None

## 2023-05-11 NOTE — Progress Notes (Signed)
Subjective:   [name] is a 82 y.o. male who presents for Medicare Annual/Subsequent preventive examination.  Visit Complete: In person  Patient Medicare AWV questionnaire was completed by the patient on 05/04/23; I have confirmed that all information answered by patient is correct and no changes since this date.  Review of Systems     Cardiac Risk Factors include: advanced age (>23men, >53 women);hypertension     Objective:    Today's Vitals   05/11/23 1334  BP: 120/60  Pulse: 60  Temp: 97.9 F (36.6 C)  TempSrc: Oral  SpO2: 95%  Weight: 153 lb 1.6 oz (69.4 kg)  Height: 5\' 6"  (1.676 m)   Body mass index is 24.71 kg/m.     05/11/2023    1:56 PM 02/02/2023    6:05 PM 11/17/2022    6:24 PM 05/09/2022    1:27 PM 03/04/2022   11:20 AM 05/03/2021   12:15 PM 03/11/2021    5:48 PM  Advanced Directives  Does Patient Have a Medical Advance Directive? Yes Yes No Yes No Yes Yes  Type of Estate agent of [place];Living will Healthcare Power of [place];Living will  Healthcare Power of [place];Living will  Healthcare Power of [place];Living will Healthcare Power of [place];Living will  Does patient want to make changes to medical advance directive?       No - Patient declined  Copy of Healthcare Power of Attorney in Chart? No - copy requested   No - copy requested  No - copy requested   Would patient like information on creating a medical advance directive?   No - Patient declined  No - Patient declined      Current Medications (verified) Outpatient Encounter Medications as of 05/11/2023  Medication Sig   acetaminophen (TYLENOL) 325 MG tablet Take 2 tablets (650 mg total) by mouth every 6 (six) hours as needed.   albuterol (VENTOLIN HFA) 108 (90 Base) MCG/ACT inhaler Inhale 2 puffs into the lungs 4 (four) times daily as needed for wheezing or shortness of breath.   amLODipine (NORVASC) 2.5 MG tablet Take 1 tablet (2.5 mg total) by mouth daily.    atorvastatin (LIPITOR) 40 MG tablet Take 1 tablet (40 mg total) by mouth daily. NEED APPT FOR FUTURE REFILLS   cholecalciferol (VITAMIN D) 25 MCG (1000 UNIT) tablet Take 1,000 Units by mouth at bedtime.   docusate sodium (COLACE) 50 MG capsule Take 50 mg by mouth 2 (two) times daily.   ELIQUIS 5 MG TABS tablet TAKE 1 TABLET BY MOUTH TWICE A DAY   Ferrous Sulfate (IRON) 325 (65 Fe) MG TABS Take 1 tablet by mouth at bedtime.   Fluticasone Furoate (ARNUITY ELLIPTA) 200 MCG/ACT AEPB Inhale 1 puff into the lungs every morning.   Magnesium 250 MG TABS Take 250 mg by mouth every morning.   Multiple Vitamin (MULTIVITAMIN WITH MINERALS) TABS tablet Take 1 tablet by mouth every morning.   Omega-3 Fatty Acids (FISH OIL PO) Take 1 capsule by mouth every morning.   omeprazole (PRILOSEC) 40 MG capsule TAKE 1 CAPSULE BY MOUTH DAILY BEFORE SUPPER.   TIMOLOL MALEATE OP Apply 1 drop to eye in the morning. 1 drop into both eyes every morning   No facility-administered encounter medications on file as of 05/11/2023.    Allergies (verified) Patient has no known allergies.   History: Past Medical History:  Diagnosis Date   Carotid artery occlusion    GERD (gastroesophageal reflux disease)    Hypertension  Stroke (HCC) 02/2021   TIA   Past Surgical History:  Procedure Laterality Date   CATARACT EXTRACTION, BILATERAL     ENDARTERECTOMY Left 09/16/2020   Procedure: LEFT ENDARTERECTOMY CAROTID;  Surgeon: Maeola Harman, MD;  Location: [facility] OR;  Service: Vascular;  Laterality: Left;   HERNIA REPAIR     PACEMAKER IMPLANT     TONSILLECTOMY     Family History  Family history unknown: Yes   Social History   Socioeconomic History   Marital status: Married    Spouse name: Pat   Number of children: Not on file   Years of education: Not on file   Highest education level: Associate degree: academic program  Occupational History   Not on file  Tobacco Use   Smoking status: Former    Passive  exposure: Never   Smokeless tobacco: Never  Vaping Use   Vaping Use: Never used  Substance and Sexual Activity   Alcohol use: Yes    Comment: occassional   Drug use: Never   Sexual activity: Not Currently  Other Topics Concern   Not on file  Social History Narrative   Lives with wife   Social Determinants of Health   Financial Resource Strain: Low Risk  (05/11/2023)   Overall Financial Resource Strain (CARDIA)    Difficulty of Paying Living Expenses: Not hard at all  Food Insecurity: No Food Insecurity (05/11/2023)   Hunger Vital Sign    Worried About Running Out of Food in the Last Year: Never true    Ran Out of Food in the Last Year: Never true  Transportation Needs: No Transportation Needs (05/11/2023)   PRAPARE - Administrator, Civil Service (Medical): No    Lack of Transportation (Non-Medical): No  Physical Activity: Sufficiently Active (05/11/2023)   Exercise Vital Sign    Days of Exercise per Week: 5 days    Minutes of Exercise per Session: 30 min  Recent Concern: Physical Activity - Insufficiently Active (05/11/2023)   Exercise Vital Sign    Days of Exercise per Week: 2 days    Minutes of Exercise per Session: 30 min  Stress: No Stress Concern Present (05/11/2023)   [name] of Occupational Health - Occupational Stress Questionnaire    Feeling of Stress : Not at all  Social Connections: Socially Integrated (05/11/2023)   Social Connection and Isolation Panel [NHANES]    Frequency of Communication with Friends and Family: More than three times a week    Frequency of Social Gatherings with Friends and Family: More than three times a week    Attends Religious Services: More than 4 times per year    Active Member of [employer] or Organizations: Yes    Attends Engineer, structural: More than 4 times per year    Marital Status: Married    Tobacco Counseling Counseling given: Not Answered   Clinical Intake:  Pre-visit preparation completed:  Yes  Pain : No/denies pain     BMI - recorded: 24.71 Nutritional Status: BMI of 19-24  Normal Nutritional Risks: None Diabetes: No  How often do you need to have someone help you when you read instructions, pamphlets, or other written materials from your doctor or pharmacy?: 1 - Never  Interpreter Needed?: No  Information entered by :: Theresa Mulligan LPN   Activities of Daily Living    05/11/2023    1:51 PM 05/11/2023    1:50 PM  In your present state of health, do you have any  difficulty performing the following activities:  Hearing? 1 1  Comment Wears hearing aids Wears hearing aids  Vision? 0 0  Difficulty concentrating or making decisions? 0 0  Walking or climbing stairs? 1 1  Comment Uses walker   Dressing or bathing? 0 0  Doing errands, shopping? 1 1  Comment Wife Advice worker and eating ? N   Using the Toilet? N   In the past six months, have you accidently leaked urine? N   Do you have problems with loss of bowel control? N   Managing your Medications? N   Managing your Finances? N   Housekeeping or managing your Housekeeping? N     Patient Care Team: [place], Betty G, MD as PCP - General (Family Medicine) Lanier Prude, MD as PCP - Electrophysiology (Cardiology) [place], Milas Kocher, [facility] (Inactive) (Pharmacist)  Indicate any recent Medical Services you may have received from other than [facility] providers in the past year (date may be approximate).     Assessment:   This is a routine wellness examination for [name].  Hearing/Vision screen Hearing Screening - Comments:: Wears hearing aids Vision Screening - Comments:: Wears rx glasses - up to date with routine eye exams with  Dr Dione Booze  Dietary issues and exercise activities discussed:     Goals Addressed               This Visit's Progress     Increase physical activity (pt-stated)         Depression Screen    05/11/2023    1:48 PM 05/09/2022    1:32 PM 05/03/2021   12:13 PM  10/21/2020   11:57 AM  PHQ 2/9 Scores  PHQ - 2 Score 0 0 0 0    Fall Risk    05/11/2023    1:54 PM 05/04/2023    3:48 PM 02/04/2023    1:37 PM 05/09/2022    1:29 PM 06/01/2021   10:59 AM  Fall Risk   Falls in the past year? 1 1 1 1 1   Comment    tripped on flooring   Number falls in past yr: 0 0 0 1 0  Injury with Fall? 1 1 1 1  0  Comment Skin tear to rt arm and head injury.Followed by medical attention   needed sutures over eye   Risk for fall due to : Impaired balance/gait   History of fall(s);Impaired balance/gait;Impaired mobility;Medication side effect   Follow up Falls prevention discussed   Falls evaluation completed;Education provided;Falls prevention discussed     MEDICARE RISK AT HOME:  Medicare Risk at Home - 05/11/23 1400     Any stairs in or around the home? Yes    If so, are there any without handrails? No    Home free of loose throw rugs in walkways, pet beds, electrical cords, etc? Yes    Adequate lighting in your home to reduce risk of falls? Yes    Life alert? No    Use of a cane, walker or w/c? Yes    Grab bars in the bathroom? Yes    Shower chair or bench in shower? Yes    Elevated toilet seat or a handicapped toilet? No             TIMED UP AND GO:  Was the test performed?  Yes  Length of time to ambulate 10 feet: 10 sec Gait slow and steady with assistive device    Cognitive Function:  03/27/2022   11:01 AM  MMSE - Mini Mental State Exam  Orientation to time 5  Orientation to Place 5  Registration 3  Attention/ Calculation 3  Recall 3  Language- name 2 objects 2  Language- repeat 1  Language- follow 3 step command 3  Language- read & follow direction 1  Write a sentence 1  Copy design 1  Total score 28        05/11/2023    1:57 PM 05/09/2022    1:35 PM 05/03/2021   12:21 PM  6CIT Screen  What Year? 0 points 0 points 0 points  What month? 0 points 0 points 0 points  What time? 0 points 0 points 3 points  Count back from 20 0  points 0 points 0 points  Months in reverse 0 points 0 points 0 points  Repeat phrase 0 points 2 points 0 points  Total Score 0 points 2 points 3 points    Immunizations Immunization History  Administered Date(s) Administered   Influenza Split 08/13/2018   Influenza-Unspecified 08/22/2021   Moderna Sars-Covid-2 Vaccination 08/22/2021   PFIZER(Purple Top)SARS-COV-2 Vaccination 11/26/2019, 12/17/2019, 08/19/2020, 03/31/2021   Pneumococcal Conjugate-13 09/07/2016   Pneumococcal Polysaccharide-23 11/10/2015   Tdap 12/21/2017, 03/03/2022   Zoster Recombinat (Shingrix) 12/14/2017, 05/14/2018    TDAP status: Up to date  Flu Vaccine status: Up to date  Pneumococcal vaccine status: Up to date  Covid-19 vaccine status: Completed vaccines  Qualifies for Shingles Vaccine? Yes   Zostavax completed Yes   Shingrix Completed?: Yes  Screening Tests Health Maintenance  Topic Date Due   COVID-19 Vaccine (6 - 2023-24 season) 05/27/2023 (Originally 07/14/2022)   INFLUENZA VACCINE  06/14/2023   Medicare Annual Wellness (AWV)  05/10/2024   DTaP/Tdap/Td (3 - Td or Tdap) 03/03/2032   Pneumonia Vaccine 41+ Years old  Completed   Zoster Vaccines- Shingrix  Completed   HPV VACCINES  Aged Out    Health Maintenance  There are no preventive care reminders to display for this patient.   Colorectal cancer screening: No longer required.   Lung Cancer Screening: (Low Dose CT Chest recommended if Age 44-80 years, 20 pack-year currently smoking OR have quit w/in 15years.) does not qualify.    Additional Screening:  Hepatitis C Screening: does not qualify; Completed   Vision Screening: Recommended annual ophthalmology exams for early detection of glaucoma and other disorders of the eye. Is the patient up to date with their annual eye exam?  Yes  Who is the provider or what is the name of the office in which the patient attends annual eye exams? Dr Dione Booze If pt is not established with a provider,  would they like to be referred to a provider to establish care? No .   Dental Screening: Recommended annual dental exams for proper oral hygiene    Community Resource Referral / Chronic Care Management:  CRR required this visit?  No   CCM required this visit?  No     Plan:     I have personally reviewed and noted the following in the patient's chart:   Medical and social history Use of alcohol, tobacco or illicit drugs  Current medications and supplements including opioid prescriptions. Patient is not currently taking opioid prescriptions. Functional ability and status Nutritional status Physical activity Advanced directives List of other physicians Hospitalizations, surgeries, and ER visits in previous 12 months Vitals Screenings to include cognitive, depression, and falls Referrals and appointments  In addition, I have reviewed and discussed with  patient certain preventive protocols, quality metrics, and best practice recommendations. A written personalized care plan for preventive services as well as general preventive health recommendations were provided to patient.     Tillie Rung, LPN   1/61/0960   After Visit Summary: Given  Nurse Notes: None

## 2023-05-11 NOTE — Patient Instructions (Addendum)
[name] , Thank you for taking time to come for your Medicare Wellness Visit. I appreciate your ongoing commitment to your health goals. Please review the following plan we discussed and let me know if I can assist you in the future.   These are the goals we discussed:  Goals       Increase physical activity (pt-stated)      Patient Stated      Continue exercise       Patient Stated      05/09/2022, wants to get stronger and walking as much as he can        This is a list of the screening recommended for you and due dates:  Health Maintenance  Topic Date Due   COVID-19 Vaccine (6 - 2023-24 season) 05/27/2023*   Flu Shot  06/14/2023   Medicare Annual Wellness Visit  05/10/2024   DTaP/Tdap/Td vaccine (3 - Td or Tdap) 03/03/2032   Pneumonia Vaccine  Completed   Zoster (Shingles) Vaccine  Completed   HPV Vaccine  Aged Out  *Topic was postponed. The date shown is not the original due date.    Advanced directives: Please bring a copy of your health care power of attorney and living will to the office to be added to your chart at your convenience.   Conditions/risks identified: None  Next appointment: Follow up in one year for your annual wellness visit.   Preventive Care 83 Years and Older, Male  Preventive care refers to lifestyle choices and visits with your health care provider that can promote health and wellness. What does preventive care include? A yearly physical exam. This is also called an annual well check. Dental exams once or twice a year. Routine eye exams. Ask your health care provider how often you should have your eyes checked. Personal lifestyle choices, including: Daily care of your teeth and gums. Regular physical activity. Eating a healthy diet. Avoiding tobacco and drug use. Limiting alcohol use. Practicing safe sex. Taking low doses of aspirin every day. Taking vitamin and mineral supplements as recommended by your health care provider. What happens  during an annual well check? The services and screenings done by your health care provider during your annual well check will depend on your age, overall health, lifestyle risk factors, and family history of disease. Counseling  Your health care provider may ask you questions about your: Alcohol use. Tobacco use. Drug use. Emotional well-being. Home and relationship well-being. Sexual activity. Eating habits. History of falls. Memory and ability to understand (cognition). Work and work Astronomer. Screening  You may have the following tests or measurements: Height, weight, and BMI. Blood pressure. Lipid and cholesterol levels. These may be checked every 5 years, or more frequently if you are over 23 years old. Skin check. Lung cancer screening. You may have this screening every year starting at age 33 if you have a 30-pack-year history of smoking and currently smoke or have quit within the past 15 years. Fecal occult blood test (FOBT) of the stool. You may have this test every year starting at age 41. Flexible sigmoidoscopy or colonoscopy. You may have a sigmoidoscopy every 5 years or a colonoscopy every 10 years starting at age 1. Prostate cancer screening. Recommendations will vary depending on your family history and other risks. Hepatitis C blood test. Hepatitis B blood test. Sexually transmitted disease (STD) testing. Diabetes screening. This is done by checking your blood sugar (glucose) after you have not eaten for a while (fasting).  You may have this done every 1-3 years. Abdominal aortic aneurysm (AAA) screening. You may need this if you are a current or former smoker. Osteoporosis. You may be screened starting at age 64 if you are at high risk. Talk with your health care provider about your test results, treatment options, and if necessary, the need for more tests. Vaccines  Your health care provider may recommend certain vaccines, such as: Influenza vaccine. This is  recommended every year. Tetanus, diphtheria, and acellular pertussis (Tdap, Td) vaccine. You may need a Td booster every 10 years. Zoster vaccine. You may need this after age 36. Pneumococcal 13-valent conjugate (PCV13) vaccine. One dose is recommended after age 36. Pneumococcal polysaccharide (PPSV23) vaccine. One dose is recommended after age 71. Talk to your health care provider about which screenings and vaccines you need and how often you need them. This information is not intended to replace advice given to you by your health care provider. Make sure you discuss any questions you have with your health care provider. Document Released: 11/26/2015 Document Revised: 07/19/2016 Document Reviewed: 08/31/2015 Elsevier Interactive Patient Education  2017 [employer].  Fall Prevention in the Home Falls can cause injuries. They can happen to people of all ages. There are many things you can do to make your home safe and to help prevent falls. What can I do on the outside of my home? Regularly fix the edges of walkways and driveways and fix any cracks. Remove anything that might make you trip as you walk through a door, such as a raised step or threshold. Trim any bushes or trees on the path to your home. Use bright outdoor lighting. Clear any walking paths of anything that might make someone trip, such as rocks or tools. Regularly check to see if handrails are loose or broken. Make sure that both sides of any steps have handrails. Any raised decks and porches should have guardrails on the edges. Have any leaves, snow, or ice cleared regularly. Use sand or salt on walking paths during winter. Clean up any spills in your garage right away. This includes oil or grease spills. What can I do in the bathroom? Use night lights. Install grab bars by the toilet and in the tub and shower. Do not use towel bars as grab bars. Use non-skid mats or decals in the tub or shower. If you need to sit down in  the shower, use a plastic, non-slip stool. Keep the floor dry. Clean up any water that spills on the floor as soon as it happens. Remove soap buildup in the tub or shower regularly. Attach bath mats securely with double-sided non-slip rug tape. Do not have throw rugs and other things on the floor that can make you trip. What can I do in the bedroom? Use night lights. Make sure that you have a light by your bed that is easy to reach. Do not use any sheets or blankets that are too big for your bed. They should not hang down onto the floor. Have a firm chair that has side arms. You can use this for support while you get dressed. Do not have throw rugs and other things on the floor that can make you trip. What can I do in the kitchen? Clean up any spills right away. Avoid walking on wet floors. Keep items that you use a lot in easy-to-reach places. If you need to reach something above you, use a strong step stool that has a grab bar. Keep  electrical cords out of the way. Do not use floor polish or wax that makes floors slippery. If you must use wax, use non-skid floor wax. Do not have throw rugs and other things on the floor that can make you trip. What can I do with my stairs? Do not leave any items on the stairs. Make sure that there are handrails on both sides of the stairs and use them. Fix handrails that are broken or loose. Make sure that handrails are as long as the stairways. Check any carpeting to make sure that it is firmly attached to the stairs. Fix any carpet that is loose or worn. Avoid having throw rugs at the top or bottom of the stairs. If you do have throw rugs, attach them to the floor with carpet tape. Make sure that you have a light switch at the top of the stairs and the bottom of the stairs. If you do not have them, ask someone to add them for you. What else can I do to help prevent falls? Wear shoes that: Do not have high heels. Have rubber bottoms. Are comfortable  and fit you well. Are closed at the toe. Do not wear sandals. If you use a stepladder: Make sure that it is fully opened. Do not climb a closed stepladder. Make sure that both sides of the stepladder are locked into place. Ask someone to hold it for you, if possible. Clearly mark and make sure that you can see: Any grab bars or handrails. First and last steps. Where the edge of each step is. Use tools that help you move around (mobility aids) if they are needed. These include: Canes. Walkers. Scooters. Crutches. Turn on the lights when you go into a dark area. Replace any light bulbs as soon as they burn out. Set up your furniture so you have a clear path. Avoid moving your furniture around. If any of your floors are uneven, fix them. If there are any pets around you, be aware of where they are. Review your medicines with your doctor. Some medicines can make you feel dizzy. This can increase your chance of falling. Ask your doctor what other things that you can do to help prevent falls. This information is not intended to replace advice given to you by your health care provider. Make sure you discuss any questions you have with your health care provider. Document Released: 08/26/2009 Document Revised: 04/06/2016 Document Reviewed: 12/04/2014 Elsevier Interactive Patient Education  2017 [employer].

## 2023-05-14 ENCOUNTER — Encounter: Payer: Self-pay | Admitting: Family Medicine

## 2023-05-14 ENCOUNTER — Ambulatory Visit (INDEPENDENT_AMBULATORY_CARE_PROVIDER_SITE_OTHER): Payer: Medicare Other | Admitting: Family Medicine

## 2023-05-14 VITALS — BP 126/70 | HR 75 | Temp 97.8°F | Resp 16 | Ht 66.0 in | Wt 154.2 lb

## 2023-05-14 DIAGNOSIS — R2681 Unsteadiness on feet: Secondary | ICD-10-CM | POA: Diagnosis not present

## 2023-05-14 DIAGNOSIS — I10 Essential (primary) hypertension: Secondary | ICD-10-CM | POA: Diagnosis not present

## 2023-05-14 DIAGNOSIS — I480 Paroxysmal atrial fibrillation: Secondary | ICD-10-CM

## 2023-05-14 DIAGNOSIS — R600 Localized edema: Secondary | ICD-10-CM | POA: Diagnosis not present

## 2023-05-14 DIAGNOSIS — G6289 Other specified polyneuropathies: Secondary | ICD-10-CM

## 2023-05-14 DIAGNOSIS — G629 Polyneuropathy, unspecified: Secondary | ICD-10-CM | POA: Insufficient documentation

## 2023-05-14 NOTE — Patient Instructions (Addendum)
A few things to remember from today's visit:  Bilateral lower extremity edema  Essential hypertension  For now continue Amlodipine. Leg elevation above heart level and compression stockings. Good skin care. I will see you in 01/2024, before if needed. Keep appt with Dr Lalla Brothers in 07/2023.  If you need refills for medications you take chronically, please call your pharmacy. Do not use My Chart to request refills or for acute issues that need immediate attention. If you send a my chart message, it may take a few days to be addressed, specially if I am not in the office.  Please be sure medication list is accurate. If a new problem present, please set up appointment sooner than planned today.

## 2023-05-14 NOTE — Assessment & Plan Note (Signed)
We discussed possible etiologies. 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 We discussed treatment options like diuretics and potential side effects. LE elevation above heart level and compression stocking recommended. We also discussed the importance of skin care and avoidance of trauma.

## 2023-05-14 NOTE — Assessment & Plan Note (Signed)
Fall precautions discussed. Continue using his walker. He has done PT a few times, no significant benefit.

## 2023-05-14 NOTE — Assessment & Plan Note (Signed)
BP adequately controlled. We discussed other options and side effects. He agrees with continuing Amlodipine 2.5 mg daily. Continue low salt diet and monitoring BP regularly. He has an appt with his cardiologist in 07/2023. I will see him back in 01/2024, before if needed.

## 2023-05-14 NOTE — Assessment & Plan Note (Signed)
Rhythm and rate controlled today. Currently on Eliquis 5 mg bid. Following with cardiologist.

## 2023-05-14 NOTE — Assessment & Plan Note (Signed)
Problem has been stable for years. We discussed treatment options as well as sided effects. He is not interested in adding new medications. We discussed appropriate foot care.

## 2023-05-23 ENCOUNTER — Other Ambulatory Visit: Payer: Self-pay | Admitting: Family Medicine

## 2023-06-15 ENCOUNTER — Other Ambulatory Visit: Payer: Self-pay | Admitting: Family Medicine

## 2023-06-15 DIAGNOSIS — I480 Paroxysmal atrial fibrillation: Secondary | ICD-10-CM

## 2023-06-26 ENCOUNTER — Ambulatory Visit: Payer: Medicare Other

## 2023-07-19 ENCOUNTER — Other Ambulatory Visit: Payer: Self-pay | Admitting: Family Medicine

## 2023-07-19 DIAGNOSIS — I10 Essential (primary) hypertension: Secondary | ICD-10-CM

## 2023-07-26 ENCOUNTER — Telehealth: Payer: Self-pay | Admitting: Family Medicine

## 2023-07-26 NOTE — Telephone Encounter (Signed)
Prescription Request  07/26/2023  LOV: 05/14/2023  What is the name of the medication or equipment? atorvastatin (LIPITOR) 40 MG tablet   Have you contacted your pharmacy to request a refill? No   Which pharmacy would you like this sent to?  [id number] - [place], [place] - [address] AT [address] OF [facility] [address] [place] [address] Phone: [phone] Fax: [phone]     Patient notified that their request is being sent to the clinical staff for review and that they should receive a response within 2 business days.   Please advise at [facility] [phone]

## 2023-07-27 MED ORDER — ATORVASTATIN CALCIUM 40 MG PO TABS: 40.0000 mg | ORAL_TABLET | Freq: Every day | ORAL | 2 refills | Status: DC

## 2023-07-27 NOTE — Telephone Encounter (Signed)
Rx sent in

## 2023-08-01 ENCOUNTER — Ambulatory Visit: Payer: Medicare Other | Admitting: Cardiology

## 2023-08-12 ENCOUNTER — Other Ambulatory Visit: Payer: Self-pay

## 2023-08-12 ENCOUNTER — Encounter (HOSPITAL_BASED_OUTPATIENT_CLINIC_OR_DEPARTMENT_OTHER): Payer: Self-pay | Admitting: Emergency Medicine

## 2023-08-12 ENCOUNTER — Emergency Department (HOSPITAL_BASED_OUTPATIENT_CLINIC_OR_DEPARTMENT_OTHER)
Admission: EM | Admit: 2023-08-12 | Discharge: 2023-08-12 | Disposition: A | Discharge status: Home / Self Care | Payer: Medicare Other | Attending: Emergency Medicine | Admitting: Emergency Medicine

## 2023-08-12 ENCOUNTER — Emergency Department (HOSPITAL_BASED_OUTPATIENT_CLINIC_OR_DEPARTMENT_OTHER): Payer: Medicare Other | Admitting: Radiology

## 2023-08-12 ENCOUNTER — Emergency Department (HOSPITAL_BASED_OUTPATIENT_CLINIC_OR_DEPARTMENT_OTHER): Payer: Medicare Other

## 2023-08-12 DIAGNOSIS — S59902A Unspecified injury of left elbow, initial encounter: Secondary | ICD-10-CM | POA: Diagnosis present

## 2023-08-12 DIAGNOSIS — I5020 Unspecified systolic (congestive) heart failure: Secondary | ICD-10-CM | POA: Insufficient documentation

## 2023-08-12 DIAGNOSIS — W1830XA Fall on same level, unspecified, initial encounter: Secondary | ICD-10-CM | POA: Insufficient documentation

## 2023-08-12 DIAGNOSIS — Z7901 Long term (current) use of anticoagulants: Secondary | ICD-10-CM | POA: Insufficient documentation

## 2023-08-12 DIAGNOSIS — R296 Repeated falls: Secondary | ICD-10-CM

## 2023-08-12 DIAGNOSIS — Z8673 Personal history of transient ischemic attack (TIA), and cerebral infarction without residual deficits: Secondary | ICD-10-CM | POA: Diagnosis not present

## 2023-08-12 DIAGNOSIS — S5002XA Contusion of left elbow, initial encounter: Secondary | ICD-10-CM | POA: Insufficient documentation

## 2023-08-12 DIAGNOSIS — S0990XA Unspecified injury of head, initial encounter: Secondary | ICD-10-CM | POA: Insufficient documentation

## 2023-08-12 DIAGNOSIS — R9082 White matter disease, unspecified: Secondary | ICD-10-CM | POA: Insufficient documentation

## 2023-08-12 LAB — COMPREHENSIVE METABOLIC PANEL
ALT: 19 U/L (ref 0–44)
AST: 20 U/L (ref 15–41)
Albumin: 3.9 g/dL (ref 3.5–5.0)
Alkaline Phosphatase: 98 U/L (ref 38–126)
Anion gap: 7 (ref 5–15)
BUN: 25 mg/dL — ABNORMAL HIGH (ref 8–23)
CO2: 26 mmol/L (ref 22–32)
Calcium: 8.9 mg/dL (ref 8.9–10.3)
Chloride: 103 mmol/L (ref 98–111)
Creatinine, Ser: 0.89 mg/dL (ref 0.61–1.24)
GFR, Estimated: >60 mL/min (ref >60)
Glucose, Bld: 97 mg/dL (ref 70–99)
Potassium: 4.2 mmol/L (ref 3.5–5.1)
Sodium: 136 mmol/L (ref 135–145)
Total Bilirubin: 0.7 mg/dL (ref 0.3–1.2)
Total Protein: 6.3 g/dL — ABNORMAL LOW (ref 6.5–8.1)

## 2023-08-12 LAB — CBC WITH DIFFERENTIAL/PLATELET
Abs Immature Granulocytes: 0.06 10*3/uL (ref 0.00–0.07)
Basophils Absolute: 0 10*3/uL (ref 0.0–0.1)
Basophils Relative: 1 %
Eosinophils Absolute: 0 10*3/uL (ref 0.0–0.5)
Eosinophils Relative: 1 %
HCT: 33.4 % — ABNORMAL LOW (ref 39.0–52.0)
Hemoglobin: 11.8 g/dL — ABNORMAL LOW (ref 13.0–17.0)
Immature Granulocytes: 1 %
Lymphocytes Relative: 42 %
Lymphs Abs: 1.8 10*3/uL (ref 0.7–4.0)
MCH: 33.4 pg (ref 26.0–34.0)
MCHC: 35.3 g/dL (ref 30.0–36.0)
MCV: 94.6 fL (ref 80.0–100.0)
Monocytes Absolute: 0.4 10*3/uL (ref 0.1–1.0)
Monocytes Relative: 9 %
Neutro Abs: 2 10*3/uL (ref 1.7–7.7)
Neutrophils Relative %: 46 %
Platelets: 167 10*3/uL (ref 150–400)
RBC: 3.53 MIL/uL — ABNORMAL LOW (ref 4.22–5.81)
RDW: 13 % (ref 11.5–15.5)
WBC: 4.3 10*3/uL (ref 4.0–10.5)
nRBC: 0 % (ref 0.0–0.2)

## 2023-08-12 LAB — PROTIME-INR
INR: 1.2 (ref 0.8–1.2)
Prothrombin Time: 15.3 s — ABNORMAL HIGH (ref 11.4–15.2)

## 2023-08-12 LAB — APTT: aPTT: 40 s — ABNORMAL HIGH (ref 24–36)

## 2023-08-12 NOTE — ED Notes (Signed)
Patient transported to CT 

## 2023-08-12 NOTE — ED Triage Notes (Signed)
Pt. Arrived via EMS after witnessed fall with complaints of pain to left elbow skin tear, and back pain. Pt. States has scoliosis and has chronic back pain. Pt. Hit head, left shoulder and elbow. Pt. Is on eloquis. EMS vitals 142/76, HR 64, SPO2 98%RA, CBG 110.

## 2023-08-12 NOTE — ED Provider Notes (Signed)
[place] [facility]ERGENCY DEPARTMENT AT [facility] Provider Note   CSN: [id number] Arrival date & time: 08/12/23  1640     History  Chief Complaint  Patient presents with   [name] is a 82 y.o. male.  With a past medical history of atrial fibrillation on Eliquis, HFpEF and TIA who presents to the ED after a fall.  Patient reports that he was ambulating with his walker outside today caring a bag of trash when he lost his balance and fell to his left side.  He struck the ground and thinks he may have hit his head.  Now with an abrasion over his left elbow some pain in his left shoulder but no other reported injuries.  Wife witnessed the fall and stated he did not lose consciousness.  Denies headaches, changes in vision, chest pain, shortness of breath, abdominal pain pain in other extremities.  He was able to ambulate immediately after.  Last dose of Eliquis was earlier this morning.   Fall       Home Medications Prior to Admission medications   Medication Sig Start Date End Date Taking? Authorizing Provider  acetaminophen (TYLENOL) 325 MG tablet Take 2 tablets (650 mg total) by mouth every 6 (six) hours as needed. 03/04/22   Sloan Leiter, DO  albuterol (VENTOLIN HFA) 108 (90 Base) MCG/ACT inhaler Inhale 2 puffs into the lungs 4 (four) times daily as needed for wheezing or shortness of breath. 01/30/23   [place], Betty G, MD  amLODipine (NORVASC) 2.5 MG tablet TAKE 1 TABLET BY MOUTH EVERY DAY 07/20/23   [place], Betty G, MD  ARNUITY ELLIPTA 200 MCG/ACT AEPB INHALE 1 PUFF INTO THE LUNGS EVERY MORNING. 05/23/23   [place], Betty G, MD  atorvastatin (LIPITOR) 40 MG tablet Take 1 tablet (40 mg total) by mouth daily. 07/27/23   [place], Betty G, MD  cholecalciferol (VITAMIN D) 25 MCG (1000 UNIT) tablet Take 1,000 Units by mouth at bedtime.    [provider]  docusate sodium (COLACE) 50 MG capsule Take 50 mg by mouth 2 (two) times daily.    [provider]   ELIQUIS 5 MG TABS tablet TAKE 1 TABLET BY MOUTH TWICE A DAY 06/15/23   [place], Betty G, MD  Ferrous Sulfate (IRON) 325 (65 Fe) MG TABS Take 1 tablet by mouth at bedtime.    [provider]  Magnesium 250 MG TABS Take 250 mg by mouth every morning.    [provider]  Multiple Vitamin (MULTIVITAMIN WITH MINERALS) TABS tablet Take 1 tablet by mouth every morning.    [provider]  Omega-3 Fatty Acids (FISH OIL PO) Take 1 capsule by mouth every morning.    [provider]  omeprazole (PRILOSEC) 40 MG capsule TAKE 1 CAPSULE BY MOUTH DAILY BEFORE SUPPER. 01/19/23   [place], Betty G, MD  TIMOLOL MALEATE OP Apply 1 drop to eye in the morning. 1 drop into both eyes every morning    [provider]      Allergies    Patient has no known allergies.    Review of Systems   Review of Systems  Physical Exam Updated Vital Signs BP (!) 157/72   Pulse 61   Temp 98 F (36.7 C)   Resp 20   Ht 5\' 6"  (1.676 m)   Wt 68 kg   SpO2 100%   BMI 24.21 kg/m  Physical Exam Vitals and nursing note reviewed.  HENT:  Head: Normocephalic and atraumatic.     Right Ear: Tympanic membrane normal.     Left Ear: Tympanic membrane normal.     Nose: Nose normal.     Mouth/Throat:     Mouth: Mucous membranes are moist.  Eyes:     Extraocular Movements: Extraocular movements intact.     Pupils: Pupils are equal, round, and reactive to light.  Cardiovascular:     Rate and Rhythm: Normal rate and regular rhythm.  Pulmonary:     Effort: Pulmonary effort is normal.     Breath sounds: Normal breath sounds.  Abdominal:     Palpations: Abdomen is soft.     Tenderness: There is no abdominal tenderness.  Musculoskeletal:     Cervical back: Normal range of motion and neck supple. No tenderness.     Comments: Small skin avulsion over left lateral elbow No bony tenderness 7 5 motor strength through all 4 extremities No deformities or bony tenderness Left shoulder with  full active range of motion No midline or paraspinal tenderness in back is without step-off or deformity  Skin:    General: Skin is warm and dry.  Neurological:     General: No focal deficit present.     Mental Status: He is alert and oriented to person, place, and time.     Sensory: No sensory deficit.     Motor: No weakness.     Coordination: Coordination normal.  Psychiatric:        Mood and Affect: Mood normal.     ED Results / Procedures / Treatments   Labs (all labs ordered are listed, but only abnormal results are displayed) Labs Reviewed  CBC WITH DIFFERENTIAL/PLATELET - Abnormal; Notable for the following components:      Result Value   RBC 3.53 (*)    Hemoglobin 11.8 (*)    HCT 33.4 (*)    All other components within normal limits  COMPREHENSIVE METABOLIC PANEL - Abnormal; Notable for the following components:   BUN 25 (*)    Total Protein 6.3 (*)    All other components within normal limits  APTT - Abnormal; Notable for the following components:   aPTT 40 (*)    All other components within normal limits  PROTIME-INR - Abnormal; Notable for the following components:   Prothrombin Time 15.3 (*)    All other components within normal limits    EKG EKG Interpretation Date/Time:  Sunday August 12 2023 17:58:40 EDT Ventricular Rate:  60 PR Interval:  155 QRS Duration:  108 QT Interval:  423 QTC Calculation: 423 R Axis:   8  Text Interpretation: Sinus rhythm Probable left ventricular hypertrophy Confirmed by Estelle June [phone]) on 08/12/2023 8:33:42 PM  Radiology DG Chest Portable 1 View  Result Date: 08/12/2023 CLINICAL DATA:  Shortness of breath EXAM: PORTABLE CHEST 1 VIEW COMPARISON:  02/02/2023 FINDINGS: Mild cardiomegaly. Left chest multi lead pacer. Lungs are clear. Osseous structures unremarkable. IMPRESSION: Mild cardiomegaly. No acute abnormality of the lungs in portable projection. Electronically Signed   By: Jearld Lesch M.D.   On: 08/12/2023  17:57   DG Elbow 2 Views Left  Result Date: 08/12/2023 CLINICAL DATA:  Fall and pain in the left elbow.  Skin tear. EXAM: LEFT ELBOW - 2 VIEW COMPARISON:  None Available. FINDINGS: There is no acute fracture or dislocation. Mild osteopenia. There is mild degenerative changes. No joint effusion. The soft tissues are unremarkable. IMPRESSION: 1. No acute fracture or dislocation. 2. Mild degenerative changes.  Electronically Signed   By: Elgie Collard M.D.   On: 08/12/2023 17:51   CT Head Wo Contrast  Result Date: 08/12/2023 CLINICAL DATA:  Fall, head injury, blood thinner EXAM: CT HEAD WITHOUT CONTRAST CT CERVICAL SPINE WITHOUT CONTRAST TECHNIQUE: Multidetector CT imaging of the head and cervical spine was performed following the standard protocol without intravenous contrast. Multiplanar CT image reconstructions of the cervical spine were also generated. RADIATION DOSE REDUCTION: This exam was performed according to the departmental dose-optimization program which includes automated exposure control, adjustment of the mA and/or kV according to patient size and/or use of iterative reconstruction technique. COMPARISON:  11/17/2022 FINDINGS: CT HEAD FINDINGS Brain: No evidence of acute infarction, hemorrhage, hydrocephalus, extra-axial collection or mass lesion/mass effect. Periventricular and deep white matter hypodensity. Incidental note of cavum septum pellucidum et vergae variant of the lateral ventricles. Vascular: No hyperdense vessel or unexpected calcification. Skull: Normal. Negative for fracture or focal lesion. Sinuses/Orbits: No acute finding. Other: None. CT CERVICAL SPINE FINDINGS Alignment: Severe degenerative reversal of the normal cervical lordosis from C3 through C6. Skull base and vertebrae: No acute fracture. No primary bone lesion or focal pathologic process. Soft tissues and spinal canal: No prevertebral fluid or swelling. No visible canal hematoma. Disc levels: Severe disc space height  loss and osteophytosis throughout the cervical spine, notable for ankylosis C4 through C6 (series 6, image 34) Upper chest: Negative. Other: None. IMPRESSION: 1. No acute intracranial pathology. Advanced small-vessel white matter disease. 2. No fracture or static subluxation of the cervical spine. 3. Severe disc space height loss and osteophytosis throughout the cervical spine, notable for ankylosis and degenerative reversal of C4 through C6. Electronically Signed   By: Jearld Lesch M.D.   On: 08/12/2023 17:39   CT Cervical Spine Wo Contrast  Result Date: 08/12/2023 CLINICAL DATA:  Fall, head injury, blood thinner EXAM: CT HEAD WITHOUT CONTRAST CT CERVICAL SPINE WITHOUT CONTRAST TECHNIQUE: Multidetector CT imaging of the head and cervical spine was performed following the standard protocol without intravenous contrast. Multiplanar CT image reconstructions of the cervical spine were also generated. RADIATION DOSE REDUCTION: This exam was performed according to the departmental dose-optimization program which includes automated exposure control, adjustment of the mA and/or kV according to patient size and/or use of iterative reconstruction technique. COMPARISON:  11/17/2022 FINDINGS: CT HEAD FINDINGS Brain: No evidence of acute infarction, hemorrhage, hydrocephalus, extra-axial collection or mass lesion/mass effect. Periventricular and deep white matter hypodensity. Incidental note of cavum septum pellucidum et vergae variant of the lateral ventricles. Vascular: No hyperdense vessel or unexpected calcification. Skull: Normal. Negative for fracture or focal lesion. Sinuses/Orbits: No acute finding. Other: None. CT CERVICAL SPINE FINDINGS Alignment: Severe degenerative reversal of the normal cervical lordosis from C3 through C6. Skull base and vertebrae: No acute fracture. No primary bone lesion or focal pathologic process. Soft tissues and spinal canal: No prevertebral fluid or swelling. No visible canal hematoma.  Disc levels: Severe disc space height loss and osteophytosis throughout the cervical spine, notable for ankylosis C4 through C6 (series 6, image 34) Upper chest: Negative. Other: None. IMPRESSION: 1. No acute intracranial pathology. Advanced small-vessel white matter disease. 2. No fracture or static subluxation of the cervical spine. 3. Severe disc space height loss and osteophytosis throughout the cervical spine, notable for ankylosis and degenerative reversal of C4 through C6. Electronically Signed   By: Jearld Lesch M.D.   On: 08/12/2023 17:39    Procedures Procedures    Medications Ordered in ED Medications - No  data to display  ED Course/ Medical Decision Making/ A&P Clinical Course as of 08/12/23 2036  Wynelle Link Aug 12, 2023  2034 CT head and C-spine show no acute traumatic findings.  Chest x-ray shows mild cardiomegaly with no other acute findings.  Left elbow x-ray shows no osseous abnormality.  Skin tear over left elbow repaired with Steri-Strips.  Patient able to ambulate well with walker here in the ED.  Laboratory workup unremarkable overall.  Stable for discharge with close PCP follow-up [MP]    Clinical Course User Index [MP] Royanne Foots, DO                                 Medical Decision Making 82 year old male with history as above including atrial fibrillation on Eliquis presenting after mechanical fall.  Patient reports that he lost his balance while ambulating in his walker and carrying a bag of trash.  Fell to the left side.  Positive head strike without LOC.  Hemodynamically stable and well-appearing on my exam.  Exam notable for skin tear over left elbow with no deformity.  No other bony tenderness.  Given that he is anticoagulated will obtain CT of the head as well as CT cervical spine to evaluate for traumatic injury.  Will also obtain chest x-ray left elbow x-ray to look for traumatic injury in these regions as well.  Will obtain basic laboratory workup and EKG.  He is  a reliable historian with a good story for mechanical fall less likely syncope and collapse.  Amount and/or Complexity of Data Reviewed Labs: ordered. Radiology: ordered.           Final Clinical Impression(s) / ED Diagnoses Final diagnoses:  Contusion of left elbow, initial encounter  Fall in elderly patient    Rx / DC Orders ED Discharge Orders     None         Royanne Foots, DO 08/12/23 2036

## 2023-08-12 NOTE — ED Notes (Signed)
ED Provider at bedside. 

## 2023-08-12 NOTE — ED Notes (Signed)
Back from CT

## 2023-08-12 NOTE — ED Notes (Signed)
Pt. Ambulated to BR with one-person assist. Pt. With shuffling gait and uses a walker at home.

## 2023-08-12 NOTE — Discharge Instructions (Signed)
[name] was seen in the emergency department after a fall The CAT scan of his head and neck looked okay His checks x-ray and elbow x-ray health so looked okay There are no broken bones We put a bandage over the skin tear on his left elbow Look out for signs of infection such as redness draining pus fevers or chills Return to the emergency department if you have any of these Otherwise please follow-up with your primary care doctor in 1 week for reevaluation Continue using your walker to get around

## 2023-08-16 ENCOUNTER — Ambulatory Visit: Payer: Medicare Other | Admitting: Neurology

## 2023-08-16 ENCOUNTER — Encounter: Payer: Self-pay | Admitting: Neurology

## 2023-08-16 VITALS — BP 133/60 | HR 60 | Ht 66.0 in | Wt 154.0 lb

## 2023-08-16 DIAGNOSIS — I482 Chronic atrial fibrillation, unspecified: Secondary | ICD-10-CM | POA: Diagnosis not present

## 2023-08-16 DIAGNOSIS — Z8673 Personal history of transient ischemic attack (TIA), and cerebral infarction without residual deficits: Secondary | ICD-10-CM

## 2023-08-16 NOTE — Progress Notes (Signed)
**Cameron Cameron via Obfuscation** Cameron Cameron [address] [place]. [place] [address] [phone]       OFFICE FOLLOW UP Cameron  Mr. MALAKIE CRUPI Date of Birth:  06-16-1983 Medical Record Number:  [id number]   Reason for visit: Hospital follow-up    SUBJECTIVE:   CHIEF COMPLAINT:  Chief Complaint  Patient presents with   Follow-up    Rm 20 with spouse Elease Hashimoto  Pt is well and stable, Cameron new TIA concerns. Wife does mention slower gait.      HPI:  Update 08/16/2023 : He returns for follow-up after last visit a year ago.  He is accompanied by his wife.  He is doing well.  Has had Cameron recurrent stroke or TIA symptoms.  He remains on Eliquis 5 mg daily which is tolerating well with minor bruising but Cameron bleeding.  States his blood pressure is under good control.  He is tolerating Lipitor well without muscle aches and pains.  Lab work on 02/06/2023 which showed hemoglobin A1c to be 5.4.  Patient does complain of hip pain as well as his balance not being good.  He was seen in the ER on 08/12/2023 for fall when he was trying to carry up trash bag and lost his balance and fell to his left side.  He had an abrasion over his left elbow and hip but did not break any bones.  CT head showed Cameron acute abnormality or bleed and CT's cervical spine also showed Cameron fractures but did show degenerative changes from C4-C6.  He has Cameron new complaints today. Update 08/15/1983 ; he returns for follow-up after his last visit with [name] nurse practitioner 3 months ago.  Is accompanied by his wife.  He states he has had Cameron more episodes of TIA or seizure difficulties or confusion last episode in June questions regarding whether the easy bruising but Cameron bleeding episodes.  His blood pressure is well controlled at home but today it is borderline at 139/74.  Remains on Lipitor 40 mg daily which is tolerating well without muscle aches and pains.  He had a follow-up carotid ultrasound on 06/08/2022 which showed only mild 1-39% bilateral  carotid disease.  Cameron new complaints today. Update [birth year]/27/1983 JM: Patient returns for recent hospital follow-up.  He is accompanied by his wife.  He presented to ED on 4/22 with altered mental status. Per ED Cameron, spouse reported episode of confusion, difficulty speaking and difficulty holding his body upright which lasted approximately 30 to 45 minutes.  Upon arrival to ED, returned back to baseline. Of Cameron, was seen the day prior, 4/21, after a mechanical fall with CTH Cameron acute finding and all other work up unremarkable.  Did require laceration repair of left eyebrow. 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 MRI unable to be completed d/t noncompatible pacer.  Presenting episode similar to prior episodes for which he was dx'd with TIA.  Evaluated by Dr. Wilford Corner who noted unclear if episode consistent with TIA vs speech arrest with some amount of cognitive decline.  Advised continued use of Eliquis and atorvastatin and recommended outpatient follow-up.  He has since been stable since that time without any reoccurring episodes or new stroke/TIA symptoms.  He has not had any additional falls, ambulates long distance and outdoors with RW for some continued imbalance and use of cane indoors/short distance. Plans  on starting PT this week.  He denies any significant memory loss or cognitive issues, admits to mild age-related memory loss which wife agrees with although he initially discusses his fall for which he was being seen for today.  His wife reminded him about discussing his TIA but he had difficulty recalling this event.  Able to maintain ADLs independently.  He Cameron longer drives.  Denies any family history of Alzheimer's dementia.  Denies any headache associated with current episode nor any history of migraine headaches. Does  report mother had bad migraine headaches.  Cameron family history of seizures.  Per wife, the morning of ED visit on 4/22, she attempted to help him to stand to go to the bathroom but had difficulty standing.  Eventually able to stand and get to the bathroom.  Once out of the bathroom, he started to feel dizzy where wife had to sit him on the couch then had difficulty responding to wife.  He did not lose consciousness but wife would have to repeat a question multiple times before he would answer but he would only answer with short/simple speech. Once EMS arrived, symptoms gradually improving and completely resolved upon ED presentation. Wife unsure of BP at that time, believes this was obtained by EMS.  Blood pressure routinely monitored at home around 1130pm and typically 130-140s/60-70s.  Blood pressure today 124/60.  Wife also mentions LE swelling which has been present since his fall. Attempted use of compression stockings but difficulty getting them on and off. Trying to watch diet and avoiding high sodium foods but as meals are prepared for him (lives in assisted living facility), this can be difficult at times. Tries to keep legs elevated as able.  He did previously complete sleep study in 09/2021 which did not show any significant sleep apnea.    Routinely follows with PCP, cardiology and vascular surgery.   Cameron further concerns at this time.     CT HEAD 02/2022 IMPRESSION: Cameron evidence of acute intracranial abnormality.  ASPECTS is 10.   Moderate chronic small ischemic changes within the cerebral white matter, stable from the prior head CT of 03/03/2022.   Mild generalized parenchymal atrophy.   Redemonstrated large left periorbital/facial hematoma.   CTA HEAD/NECK 02/2022 CTA neck: 1. The common carotid and internal carotid arteries are patent within the neck without hemodynamically significant stenosis. Atherosclerotic plaque on the right, as described. Redemonstrated sequela of  prior left carotid endarterectomy. Unchanged appearance of a possible focal dissection flap within the proximal left external carotid artery (with mild luminal narrowing at this site). 2. Vertebral arteries patent within the neck. Unchanged mild-to-moderate atherosclerotic narrowing at the origin of the dominant left vertebral artery. 3. Redemonstrated enlarged and heterogeneous thyroid gland. Given the patient's advanced age, a nonemergent thyroid ultrasound may be obtained if clinically appropriate.   CTA head: 1. Cameron emergent large vessel occlusion identified. 2. Calcified plaque within the intracranial internal carotid arteries and V4 left vertebral artery with Cameron more than mild stenosis.     History provided for reference purposes only Update 09/07/2021 JM: Returns for 42-month stroke follow-up accompanied by his wife.  Doing well since prior visit.  Cameron reoccurring or new stroke/TIA symptoms since prior visit.  Denies any recent falls.  Compliant on aspirin, Eliquis and atorvastatin -denies side effects.  Routinely followed by PCP, cardiology and vascular surgery.  Evaluated by Dr. Frances Furbish 08/04/2021 and scheduled for sleep study on 11/16. Cameron new concerns at this time.   Update 06/01/2021 JM:  Mr. Dufour returns for follow-up visit regarding recurrent TIAs.  Since he was last seen 5 months ago, he presented to ED on 03/11/2021 for likely recrudescence of expressive aphasia in setting of hypotension (88/51) and AKI (per ED Cameron).  Imaging and work-up unremarkable.  He has not had any additional events since that time.  Reports compliance on Eliquis, aspirin 81 mg daily and atorvastatin 40 mg daily without associated side effects.  Blood pressure today 124/66. Routinely monitors at home and has been stable. Has follow up with Dr. Randie Heinz on 9/1 for carotid duplex. He is interested in pursing sleep evaluation at this time. Cameron further concerns at this time.   Update 01/06/2021 JM: Mr. Keirsey is being seen  for recent TIA hospital follow-up.  He presented to [facility] ED on 12/24/2020 with notes, lab work and imaging personally reviewed with reported dizziness and lightheadedness and difficulty speaking after a fall.  He has had prior TIAs with speech difficulty with this event similar to prior episodes but not as bad and symptoms completely resolved.  Evaluated by Dr. Pearlean Brownie with CT head negative and CTA L carotid endarterectomy patent without significant stenosis.  MRI unable to be obtained due to pacer.  LDL 34.  A1c 5.6.  It was felt as though his symptoms possibly more related to syncopal event vs TIA although he does have stroke risk factors including atrial flutter on Eliquis, HLD on atorvastatin, HTN and prediabetes.  Since that time, he has not had any additional or new stroke/TIA symptoms.   Reports compliance on Eliquis 5 mg twice daily, aspirin 81 mg daily and atorvastatin 40 mg daily -denies side effects Blood pressure today 142/72 - monitors at home and typically stable  Cameron new concerns at this time  Initial visit 10/21/2020 JM: Mr. Kemme is being seen for hospital follow-up accompanied by his wife.  He has been doing well since discharge without new or reoccurring stroke/TIA symptoms.  Remains on aspirin and Eliquis for secondary stroke prevention with known AF and s/p L CEA without bleeding or bruising.  Remains on atorvastatin 40 mg daily without myalgias.  Blood pressure today satisfactory 136/61.  Wife concerned regarding possible apnea with sleep study 3 years ago in [place], [place] diagnosed with mild sleep apnea per patient (unable to view via epic) and Cameron treatment indicated.  Since that time, increased nocturia q2-3hrs, witnessed apnea and snoring.  Use of some type of mouthguard and head elevation while sleeping which has decreased snoring and apnea. Cameron further concerns.   Stroke admission 09/11/2020 Mr. Kayan Quigg is a 82 y.o. male who recently moved to [facility] from [place] with a PMHx  of Htn, GERD, ? atrial fibrillation, bradycardia s/p pacemaker in 2016.  Presented on 09/11/2020 with word finding difficulty and headache.  Personally reviewed pertinent hospitalization progress notes, lab work and imaging with summary provided.  Evaluated by Dr. Roda Shutters with likely TIA secondary to AF not on AC vs soft plaque at left ICA bulb.  MRI unable to be obtained d/t noncompatible pacer.  Pacer interrogation +SVT and short run of a flutter.  Questionable history of A. fib which was confirmed by prior cardiologist but low burden therefore AC not started.  In setting of recent TIA, initiated Eliquis for secondary stroke prevention. Hx of HTN stable and resumed home dose metoprolol. Hx of HLD on pravastatin with LDL 58.  Other stroke risk factors include advanced age, EtOH use and family history of strokes but Cameron personal prior history of  strokes.  Other active problems include anemia, aortic arthrosclerosis, bradycardia s/p pacer and GERD.  Evaluated by therapies without additional therapy needs and discharged home in stable condition on 09/14/2020.  He returned shortly after discharge on 09/14/2020 with recurrence of speech and language difficulty.  CTA head/neck repeated negative LVO or worsening stenosis with stable fibrofatty plaque of left ICA origin.  Vascular surgery consulted and underwent left CEA without complication.  Initiated aspirin in addition to Eliquis at discharge.  Symptoms resolved and discharged home without therapy needs on 09/17/2020.    09/11/2020 TIA secondary to AF not on AC vs. Soft plaque at left ICA bulb CT head - Cameron acute abnormality MRI head - not able to do due to pacer not compatible with MRI CTA H&N - Cameron emergent large vessel occlusion or high-grade stenosis of the intracranial arteries. Eccentric plaque within the proximal left internal carotid artery may be at increased risk of embolus formation. Bilateral carotid bifurcation atherosclerosis without hemodynamically  significant stenosis by NASCET criteria.  CT repeat Unremarkable  2D Echo - EF 55-60%. Cameron source of embolus  Pacemaker interrogation +SVT, short run of Aflutter per Dr. 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 ASPECTS 10 CTA H&N repeat - stable. Cameron LVO or significant stenosis. Irregular L ICA plaque at origin, unchanged.  S/p L CEA for high risk soft plaque left ICA LDL - 58 -initiated atorvastatin 40 mg daily HgbA1c - 5.4 UDS - negative VTE prophylaxis - Eliquis aspirin 81 mg daily prior to admission, on ASA 81mg  post left CEA. Will be discharged with ASA 81 and eliquis 5mg  bid.      ROS:   14 system review of systems performed and negative with exception of those listed in HPI  PMH:  Past Medical History:  Diagnosis Date   Carotid artery occlusion    GERD (gastroesophageal reflux disease)    Hypertension    Stroke (HCC) 02/2021   TIA    PSH:  Past Surgical History:  Procedure Laterality Date   CATARACT EXTRACTION, BILATERAL     ENDARTERECTOMY Left 09/16/2020   Procedure: LEFT ENDARTERECTOMY CAROTID;  Surgeon: Maeola Harman, MD;  Location[facility] OR;  Service: Vascular;  Laterality: Left;   HERNIA REPAIR     PACEMAKER IMPLANT     TONSILLECTOMY      Social History:  Social History   Socioeconomic History   Marital status: Married    Spouse name: Garment/textile technologist   Number of children: Not on file   Years of education: Not on file   Highest education level: Associate degree: academic program  Occupational History   Not on file  Tobacco Use   Smoking status: Former    Passive exposure: Never   Smokeless tobacco: Never   Vaping Use   Vaping status: Never Used  Substance and Sexual Activity   Alcohol use: Not Currently    Comment: occassional   Drug use: Never   Sexual activity: Not Currently  Other Topics Concern   Not on file  Social History Narrative   Lives with wife   Social Determinants of Health  Financial Resource Strain: Low Risk  (05/11/2023)   Overall Financial Resource Strain (CARDIA)    Difficulty of Paying Living Expenses: Not hard at all  Food Insecurity: Cameron Food Insecurity (05/11/2023)   Hunger Vital Sign    Worried About Running Out of Food in the Last Year: Never true    Ran Out of Food in the Last Year: Never true  Transportation Needs: Cameron Transportation Needs (05/11/2023)   PRAPARE - Administrator, Civil Service (Medical): Cameron    Lack of Transportation (Non-Medical): Cameron  Physical Activity: Sufficiently Active (05/11/2023)   Exercise Vital Sign    Days of Exercise per Week: 5 days    Minutes of Exercise per Session: 30 min  Recent Concern: Physical Activity - Insufficiently Active (05/11/2023)   Exercise Vital Sign    Days of Exercise per Week: 2 days    Minutes of Exercise per Session: 30 min  Stress: Cameron Stress Concern Present (05/11/2023)   [name] of Occupational Health - Occupational Stress Questionnaire    Feeling of Stress : Not at all  Social Connections: Socially Integrated (05/11/2023)   Social Connection and Isolation Panel [NHANES]    Frequency of Communication with Friends and Family: More than three times a week    Frequency of Social Gatherings with Friends and Family: More than three times a week    Attends Religious Services: More than 4 times per year    Active Member of [employer] or Organizations: Yes    Attends Engineer, structural: More than 4 times per year    Marital Status: Married  Catering manager Violence: Not At Risk (05/11/2023)   Humiliation, Afraid, Rape, and Kick questionnaire    Fear of Current or Ex-Partner: Cameron     Emotionally Abused: Cameron    Physically Abused: Cameron    Sexually Abused: Cameron    Family History:  Family History  Family history unknown: Yes    Medications:   Current Outpatient Medications on File Prior to Visit  Medication Sig Dispense Refill   acetaminophen (TYLENOL) 325 MG tablet Take 2 tablets (650 mg total) by mouth every 6 (six) hours as needed. 36 tablet 0   albuterol (VENTOLIN HFA) 108 (90 Base) MCG/ACT inhaler Inhale 2 puffs into the lungs 4 (four) times daily as needed for wheezing or shortness of breath. 8 g 1   amLODipine (NORVASC) 2.5 MG tablet TAKE 1 TABLET BY MOUTH EVERY DAY 90 tablet 1   ARNUITY ELLIPTA 200 MCG/ACT AEPB INHALE 1 PUFF INTO THE LUNGS EVERY MORNING. 90 each 3   atorvastatin (LIPITOR) 40 MG tablet Take 1 tablet (40 mg total) by mouth daily. 90 tablet 2   cholecalciferol (VITAMIN D) 25 MCG (1000 UNIT) tablet Take 1,000 Units by mouth at bedtime.     docusate sodium (COLACE) 50 MG capsule Take 50 mg by mouth 2 (two) times daily.     ELIQUIS 5 MG TABS tablet TAKE 1 TABLET BY MOUTH TWICE A DAY 180 tablet 1   Ferrous Sulfate (IRON) 325 (65 Fe) MG TABS Take 1 tablet by mouth at bedtime.     Magnesium 250 MG TABS Take 250 mg by mouth every morning.     Multiple Vitamin (MULTIVITAMIN WITH MINERALS) TABS tablet Take 1 tablet by mouth every morning.     Omega-3 Fatty Acids (FISH OIL PO) Take 1 capsule by mouth every morning.     omeprazole (PRILOSEC) 40 MG capsule TAKE 1 CAPSULE BY MOUTH DAILY BEFORE  SUPPER. 90 capsule 2   TIMOLOL MALEATE OP Apply 1 drop to eye in the morning. 1 drop into both eyes every morning     Cameron current facility-administered medications on file prior to visit.    Allergies:  Cameron Known Allergies    OBJECTIVE:  Physical Exam  Vitals:   08/16/23 1300  BP: 133/60  Pulse: 60  Weight: 154 lb (69.9 kg)  Height: 5\' 10"  (1.778 m)     Body mass index is 22.1 kg/m. Cameron results found.  General: well developed, well nourished,  very pleasant  elderly Caucasian male, seated, in Cameron evident distress Head: head normocephalic and atraumatic.   Neck: supple with Cameron carotid or supraclavicular bruits Cardiovascular: regular rate and rhythm, Cameron murmurs Musculoskeletal: Cameron deformity Skin:  R>L LE pitting edema with mild redness and warmth RLE compared to LLE Vascular:  Normal pulses all extremities   Neurologic Exam Mental Status: Awake and fully alert.   Fluent speech and language.  Oriented to place and time. Recent memory mildly impaired and remote memory intact. Attention span, concentration and fund of knowledge mostly intact.  Mood and affect appropriate.     03/27/2022   11:01 AM  MMSE - Mini Mental State Exam  Orientation to time 5  Orientation to Place 5  Registration 3  Attention/ Calculation 3  Recall 3  Language- name 2 objects 2  Language- repeat 1  Language- follow 3 step command 3  Language- read & follow direction 1  Write a sentence 1  Copy design 1  Total score 28   Cranial Nerves: Pupils equal, briskly reactive to light. Extraocular movements full without nystagmus. Visual fields full to confrontation.  HOH bilaterally. Facial sensation intact. Face, tongue, palate moves normally and symmetrically.  Motor: Normal bulk and tone. Normal strength in all tested extremity muscles. Sensory.: intact to touch , pinprick , position and vibratory sensation.  Coordination: Rapid alternating movements normal in all extremities. Finger-to-nose and heel-to-shin performed accurately bilaterally. Gait and Station: Arises from chair without difficulty. Stance is hunched with scoliosis. Gait demonstrates normal stride length although decreased step height bilaterally with mild unsteadiness and use of rolling walker.  Tandem walk and heel toe not attempted. Reflexes: 1+ and symmetric. Toes downgoing.         ASSESSMENT: PARAS TELLMAN is a 82 y.o. year old male with recurrent episodes of aphasia and AMS in 08/2020 (felt in  setting of TIA 2/2 A fib not on AC vs L ICA bulb soft plaque), 09/2020 (felt TIA in setting of high risk of plaque of left ICA s/p L CEA), 12/2020 (post fall possibly presyncope vs TIA), 02/2021 (likely in setting of hypotension) and most recently 03/04/2022. Vascular risk factors include A. fib/flutter, HTN, HLD, bradycardia s/p pacer, advanced age and hx of EtOH use.      PLAN:  I had a long discussion with the patient and his wife regarding his TIA in June 2023 and chronic A-fib on anticoagulation with Eliquis and discussed risk for recurrent strokes and stroke prevention and answered questions.  Recommend he continue Eliquis for now but will discussed alternatives and lack of definitive data on switching anticoagulants.  I also discussed possibility of participating in [place] AF stroke prevention study ( Eliquis  versus Milvexian-Factor 11 inhibitor) . He expressed interest and was given written study material to review at home and decide.  He was also advised to maintain aggressive risk factor modification with strict control of hypertension with blood  pressure goal below 140/90, lipids with LDL cholesterol goal below 70 mg percent and diabetes with hemoglobin A1c goal below 6.5%.  He was also advised to eat a healthy diet with lots of fruits, vegetables, fiber regularly.  Return for follow-up in the future in 1 year or call earlier if necessary. Greater than 50% time during this 35-minute visit was spent on counseling and coordination of care about.  TIA and atrial fibrillation and discussion about stroke symptoms. Delia Heady, MD [facility] Cameron [address] [place], [place] [address]  Phone [phone] Fax [phone] Cameron: This document was prepared with digital dictation and possible smart phrase technology. Any transcriptional errors that result from this process are unintentional.

## 2023-08-16 NOTE — Patient Instructions (Signed)
I had a long discussion with the patient and his wife regarding his TIA in June 2023 and chronic A-fib on anticoagulation with Eliquis and discussed risk for recurrent strokes and stroke prevention and answered questions.  Recommend he continue Eliquis for now but will discussed alternatives and lack of definitive data on switching anticoagulants.  I also discussed possibility of participating in [place] AF stroke prevention study ( Eliquis  versus Milvexian-Factor 11 inhibitor) . He expressed interest and was given written study material to review at home and decide.  He was also advised to maintain aggressive risk factor modification with strict control of hypertension with blood pressure goal below 140/90, lipids with LDL cholesterol goal below 70 mg percent and diabetes with hemoglobin A1c goal below 6.5%.  He was also advised to eat a healthy diet with lots of fruits, vegetables, fiber regularly.  Return for follow-up in the future in 1 year or call earlier if necessary.

## 2023-08-17 ENCOUNTER — Encounter: Payer: Self-pay | Admitting: Family Medicine

## 2023-08-17 ENCOUNTER — Ambulatory Visit (INDEPENDENT_AMBULATORY_CARE_PROVIDER_SITE_OTHER): Payer: Medicare Other | Admitting: Family Medicine

## 2023-08-17 VITALS — BP 130/70 | HR 75 | Resp 16 | Ht 66.0 in | Wt 154.0 lb

## 2023-08-17 DIAGNOSIS — R7303 Prediabetes: Secondary | ICD-10-CM

## 2023-08-17 DIAGNOSIS — R2681 Unsteadiness on feet: Secondary | ICD-10-CM | POA: Diagnosis not present

## 2023-08-17 DIAGNOSIS — I700 Atherosclerosis of aorta: Secondary | ICD-10-CM | POA: Diagnosis not present

## 2023-08-17 DIAGNOSIS — D6859 Other primary thrombophilia: Secondary | ICD-10-CM | POA: Diagnosis not present

## 2023-08-17 DIAGNOSIS — W19XXXD Unspecified fall, subsequent encounter: Secondary | ICD-10-CM

## 2023-08-17 DIAGNOSIS — M25552 Pain in left hip: Secondary | ICD-10-CM

## 2023-08-17 NOTE — Patient Instructions (Addendum)
A few things to remember from today's visit:  Fall, subsequent encounter - Plan: Ambulatory referral to Home Health  Chronic anticoagulation  Unstable gait - Plan: Ambulatory referral to Home Health  Prediabetes - Plan: Hemoglobin A1c  Atherosclerosis of aorta (HCC) - Plan: Lipid panel  If you need refills for medications you take chronically, please call your pharmacy. Do not use My Chart to request refills or for acute issues that need immediate attention. If you send a my chart message, it may take a few days to be addressed, specially if I am not in the office.  Please be sure medication list is accurate. If a new problem present, please set up appointment sooner than planned today.

## 2023-08-17 NOTE — Assessment & Plan Note (Signed)
Encouraged consistency with a healthy like style for diabetes prevention. HgA1C will be added to next blood work.

## 2023-08-17 NOTE — Progress Notes (Signed)
ACUTE VISIT Chief Complaint  Patient presents with   Follow-up    Ed f/u, post fall   HPI: [name] is a 82 y.o. male with a PMHx significant for HFpEF, HTN, PAD, atherosclerosis of aorta, GERD, peripheral neuropathy, and prediabetes, who is here today with his wife for post ED follow up after a fall.   Last seen on 05/14/2023.  He has since been to the ER on 9/29 and also saw neurology on 10/3.   He states that he fell while leaning forward to throw something in a trash can after returning from a walk. He says he fell on his left side. He notes he hit his head, no LOC. He also had a bruise and superficial excoriation on his left elbow and some hip pain. He states his hip pain is improving every day.  He denies any unusual headache,vision changes, or focal neurologic deficit.   Elbow and CXR negative for acute process. No elbow fracture or dislocation. Cervical and head CT:  1. No acute intracranial pathology. Advanced small-vessel white matter disease. 2. No fracture or static subluxation of the cervical spine. 3. Severe disc space height loss and osteophytosis throughout the cervical spine, notable for ankylosis and degenerative reversal of C4 through C6.  Lab Results  Component Value Date   NA 136 08/12/2023   CL 103 08/12/2023   K 4.2 08/12/2023   CO2 26 08/12/2023   BUN 25 (H) 08/12/2023   CREATININE 0.89 08/12/2023   GFRNONAA >60 08/12/2023   CALCIUM 8.9 08/12/2023   ALBUMIN 3.9 08/12/2023   GLUCOSE 97 08/12/2023   Lab Results  Component Value Date   ALT 19 08/12/2023   AST 20 08/12/2023   ALKPHOS 98 08/12/2023   BILITOT 0.7 08/12/2023   Lab Results  Component Value Date   WBC 4.3 08/12/2023   HGB 11.8 (L) 08/12/2023   HCT 33.4 (L) 08/12/2023   MCV 94.6 08/12/2023   PLT 167 08/12/2023   His wife reports his balance has worsened over the last 6 months. He states he uses his walker all the time.  He says his last fall was 4-5 months ago.   He also  mentions he has been going twice a week to a group exercise class but has not done PT in a while.  His wife is inquiring about cholesterol check. Aortic atherosclerosis,TIA,and PAD. He is on Atorvastatin 40 mg daily.  Lab Results  Component Value Date   CHOL 78 03/12/2021   HDL 33 (L) 03/12/2021   LDLCALC 31 03/12/2021   TRIG 68 03/12/2021   CHOLHDL 2.4 03/12/2021   Atrial fib on Eliquis 5 mg bid. He has an appointment with cardiology 09/04/23.  Prediabetes: Negative for abdominal pain, nausea,vomiting, polydipsia,polyuria, or polyphagia. Lab Results  Component Value Date   HGBA1C 5.8 02/06/2023   Review of Systems  Constitutional:  Negative for chills and fever.  Respiratory:  Negative for cough and wheezing.   Cardiovascular:  Negative for chest pain, palpitations and leg swelling.  Genitourinary:  Negative for decreased urine volume, dysuria and hematuria.  Musculoskeletal:  Positive for arthralgias and gait problem.  Skin:  Negative for wound.  Neurological:  Negative for syncope and headaches.  Psychiatric/Behavioral:  Negative for confusion and hallucinations.   See other pertinent positives and negatives in HPI.  Current Outpatient Medications on File Prior to Visit  Medication Sig Dispense Refill   acetaminophen (TYLENOL) 325 MG tablet Take 2 tablets (650 mg total) by mouth  every 6 (six) hours as needed. 36 tablet 0   albuterol (VENTOLIN HFA) 108 (90 Base) MCG/ACT inhaler Inhale 2 puffs into the lungs 4 (four) times daily as needed for wheezing or shortness of breath. 8 g 1   amLODipine (NORVASC) 2.5 MG tablet TAKE 1 TABLET BY MOUTH EVERY DAY 90 tablet 1   ARNUITY ELLIPTA 200 MCG/ACT AEPB INHALE 1 PUFF INTO THE LUNGS EVERY MORNING. 90 each 3   atorvastatin (LIPITOR) 40 MG tablet Take 1 tablet (40 mg total) by mouth daily. 90 tablet 2   cholecalciferol (VITAMIN D) 25 MCG (1000 UNIT) tablet Take 1,000 Units by mouth at bedtime.     docusate sodium (COLACE) 50 MG capsule  Take 50 mg by mouth 2 (two) times daily.     ELIQUIS 5 MG TABS tablet TAKE 1 TABLET BY MOUTH TWICE A DAY 180 tablet 1   Ferrous Sulfate (IRON) 325 (65 Fe) MG TABS Take 1 tablet by mouth at bedtime.     Magnesium 250 MG TABS Take 250 mg by mouth every morning.     Multiple Vitamin (MULTIVITAMIN WITH MINERALS) TABS tablet Take 1 tablet by mouth every morning.     Omega-3 Fatty Acids (FISH OIL PO) Take 1 capsule by mouth every morning.     omeprazole (PRILOSEC) 40 MG capsule TAKE 1 CAPSULE BY MOUTH DAILY BEFORE SUPPER. 90 capsule 2   TIMOLOL MALEATE OP Apply 1 drop to eye in the morning. 1 drop into both eyes every morning     No current facility-administered medications on file prior to visit.    Past Medical History:  Diagnosis Date   Carotid artery occlusion    GERD (gastroesophageal reflux disease)    Hypertension    Stroke (HCC) 02/2021   TIA   No Known Allergies  Social History   Socioeconomic History   Marital status: Married    Spouse name: [name]   Number of children: Not on file   Years of education: Not on file   Highest education level: Associate degree: academic program  Occupational History   Not on file  Tobacco Use   Smoking status: Former    Passive exposure: Never   Smokeless tobacco: Never  Vaping Use   Vaping status: Never Used  Substance and Sexual Activity   Alcohol use: Not Currently    Comment: occassional   Drug use: Never   Sexual activity: Not Currently  Other Topics Concern   Not on file  Social History Narrative   Lives with wife   Social Determinants of Health   Financial Resource Strain: Low Risk  (05/11/2023)   Overall Financial Resource Strain (CARDIA)    Difficulty of Paying Living Expenses: Not hard at all  Food Insecurity: No Food Insecurity (05/11/2023)   Hunger Vital Sign    Worried About Running Out of Food in the Last Year: Never true    Ran Out of Food in the Last Year: Never true  Transportation Needs: No Transportation Needs  (05/11/2023)   PRAPARE - Administrator, Civil Service (Medical): No    Lack of Transportation (Non-Medical): No  Physical Activity: Sufficiently Active (05/11/2023)   Exercise Vital Sign    Days of Exercise per Week: 5 days    Minutes of Exercise per Session: 30 min  Recent Concern: Physical Activity - Insufficiently Active (05/11/2023)   Exercise Vital Sign    Days of Exercise per Week: 2 days    Minutes of Exercise per Session:  30 min  Stress: No Stress Concern Present (05/11/2023)   [name] of Occupational Health - Occupational Stress Questionnaire    Feeling of Stress : Not at all  Social Connections: Socially Integrated (05/11/2023)   Social Connection and Isolation Panel [NHANES]    Frequency of Communication with Friends and Family: More than three times a week    Frequency of Social Gatherings with Friends and Family: More than three times a week    Attends Religious Services: More than 4 times per year    Active Member of [employer] or Organizations: Yes    Attends Banker Meetings: More than 4 times per year    Marital Status: Married   Vitals:   08/17/23 1330  BP: 130/70  Pulse: 75  Resp: 16  SpO2: 97%   Body mass index is 24.86 kg/m.  Physical Exam Vitals and nursing note reviewed.  Constitutional:      General: He is not in acute distress.    Appearance: He is well-developed.  HENT:     Head: Normocephalic and atraumatic.  Eyes:     Conjunctiva/sclera: Conjunctivae normal.  Cardiovascular:     Rate and Rhythm: Normal rate and regular rhythm.     Pulses:          Dorsalis pedis pulses are 2+ on the right side and 2+ on the left side.     Heart sounds: No murmur heard.    Comments: Bilateral trace pitting LE edema. Pulmonary:     Effort: Pulmonary effort is normal. No respiratory distress.     Breath sounds: Normal breath sounds.  Abdominal:     Palpations: Abdomen is soft. There is no mass.     Tenderness: There is no  abdominal tenderness.  Musculoskeletal:     Left shoulder: No tenderness or bony tenderness. Decreased range of motion.     Lumbar back: No tenderness.     Left hip: Tenderness (with movement, not with palpation.) present. No bony tenderness. Decreased range of motion (flexion and internal rotation.).     Comments: Leg length with no significant different.  Skin:    General: Skin is warm.     Findings: Ecchymosis (On forearms and dorsum of hands.) present. No erythema or rash.  Neurological:     Mental Status: He is alert and oriented to person, place, and time.     Comments: Unstable gait assisted with a walker.  Psychiatric:        Mood and Affect: Mood and affect normal.   ASSESSMENT AND PLAN:  [name] was seen today for post ER follow up after a fall.   Fall, subsequent encounter Some of his co morbilities can increase risk. Fall precautions discussed. I do not think imaging needs to be repeated. Monitor for neurologic/MS changes. PT/OT will be arranged through [facility].  -     Ambulatory referral to Home Health  Unstable gait -     Ambulatory referral to Home Health  Prediabetes Assessment & Plan: Encouraged consistency with a healthy like style for diabetes prevention. HgA1C will be added to next blood work.  Orders: -     Hemoglobin A1c; Future  Atherosclerosis of aorta [facility]) Assessment & Plan: Seen on abdominal US. He is on Atorvastatin 40 mg daily. LDL 31 in 02/2021.  Orders: -     Lipid panel; Future  Hypercoagulable state (HCC) Assessment & Plan: Currently on Eliquis 5 mg bid for atrial fib. We reviewed side effects and the risk  of serious bleeding with trauma.  I spent a total of 31 minutes in both face to face and non face to face activities for this visit on the date of this encounter. During this time history was obtained and documented, examination was performed, prior labs/imaging reviewed, and assessment/plan discussed.  Return if symptoms worsen or  fail to improve, for keep next appointment. Labs next week.[name], acting as a scribe for Cameron Caradonna [place], MD., have documented all relevant documentation on the behalf of Cameron Gillentine [place], MD, as directed by  Cameron Burgener [place], MD while in the presence of Cameron Forquer [place], MD.   I, Cameron Raffield [place], MD, have reviewed all documentation for this visit. The documentation on 08/17/23 for the exam, diagnosis, procedures, and orders are all accurate and complete.  [name] G. [place], MD  [facility]. [place] [facility].

## 2023-08-17 NOTE — Assessment & Plan Note (Signed)
Currently on Eliquis 5 mg bid for atrial fib. We reviewed side effects and the risk of serious bleeding with trauma.

## 2023-08-17 NOTE — Assessment & Plan Note (Signed)
Seen on abdominal US. He is on Atorvastatin 40 mg daily. LDL 31 in 02/2021.

## 2023-08-20 ENCOUNTER — Telehealth: Payer: Self-pay | Admitting: Family Medicine

## 2023-08-20 DIAGNOSIS — M25552 Pain in left hip: Secondary | ICD-10-CM

## 2023-08-20 DIAGNOSIS — W19XXXD Unspecified fall, subsequent encounter: Secondary | ICD-10-CM

## 2023-08-20 NOTE — Telephone Encounter (Signed)
Pt seen dr Swaziland on 08-17-2023 had a fall now hips are hurting and would like xray

## 2023-08-20 NOTE — Telephone Encounter (Signed)
Yes, he can have a hip X ray done due to hip pain after fall. Thanks, [name]

## 2023-08-20 NOTE — Telephone Encounter (Signed)
Okay to add hip x-ray when he comes to the lab on 10/9? They did his elbow but not his hip in the ED.

## 2023-08-21 NOTE — Telephone Encounter (Signed)
I called and spoke with patient's wife. He is having pain in the left hip. She is aware he can have x-ray done tomorrow while he is here for his lab work. Order has been placed.

## 2023-08-22 ENCOUNTER — Other Ambulatory Visit: Payer: Medicare Other

## 2023-08-22 ENCOUNTER — Ambulatory Visit (INDEPENDENT_AMBULATORY_CARE_PROVIDER_SITE_OTHER): Payer: Medicare Other

## 2023-08-22 ENCOUNTER — Telehealth: Payer: Self-pay | Admitting: Family Medicine

## 2023-08-22 DIAGNOSIS — I700 Atherosclerosis of aorta: Secondary | ICD-10-CM

## 2023-08-22 DIAGNOSIS — W19XXXD Unspecified fall, subsequent encounter: Secondary | ICD-10-CM

## 2023-08-22 DIAGNOSIS — M25552 Pain in left hip: Secondary | ICD-10-CM | POA: Diagnosis not present

## 2023-08-22 DIAGNOSIS — R7303 Prediabetes: Secondary | ICD-10-CM

## 2023-08-22 NOTE — Telephone Encounter (Signed)
[name] [facility] is calling pt was not fasting for lipid panel  today and pt is sch for next wk and need future order for Alc and lipid

## 2023-08-22 NOTE — Addendum Note (Signed)
Addended by: Kathreen Devoid on: 08/22/2023 03:32 PM   Modules accepted: Orders

## 2023-08-22 NOTE — Telephone Encounter (Signed)
Lipid panel re-ordered. A1C doesn't have to be fasting.

## 2023-08-23 ENCOUNTER — Other Ambulatory Visit: Payer: Self-pay

## 2023-08-23 DIAGNOSIS — R7303 Prediabetes: Secondary | ICD-10-CM

## 2023-08-27 ENCOUNTER — Telehealth: Payer: Self-pay | Admitting: Family Medicine

## 2023-08-27 DIAGNOSIS — M25552 Pain in left hip: Secondary | ICD-10-CM

## 2023-08-27 NOTE — Telephone Encounter (Signed)
Pt's wife called and stated that during pt's last appointment he was told he would need to do Physical Therapy. They are requesting a referral to be sent to [facility], Dr. Jonny Ruiz Davidson's office, for physical therapy.

## 2023-08-27 NOTE — Telephone Encounter (Signed)
New referral entered

## 2023-08-29 ENCOUNTER — Other Ambulatory Visit (INDEPENDENT_AMBULATORY_CARE_PROVIDER_SITE_OTHER): Payer: Medicare Other

## 2023-08-29 DIAGNOSIS — I700 Atherosclerosis of aorta: Secondary | ICD-10-CM

## 2023-08-29 DIAGNOSIS — R7303 Prediabetes: Secondary | ICD-10-CM | POA: Diagnosis not present

## 2023-08-29 LAB — LIPID PANEL
Cholesterol: 90 mg/dL (ref 0–200)
HDL: 40 mg/dL (ref >39.00)
LDL Cholesterol: 29 mg/dL (ref 0–99)
NonHDL: 49.73
Total CHOL/HDL Ratio: 2
Triglycerides: 104 mg/dL (ref 0.0–149.0)
VLDL: 20.8 mg/dL (ref 0.0–40.0)

## 2023-08-29 LAB — HEMOGLOBIN A1C: Hgb A1c MFr Bld: 5.6 % (ref 4.6–6.5)

## 2023-09-02 NOTE — Progress Notes (Unsigned)
Cardiology Office Note:  .   Date:  09/02/2023  ID:  [name], DOB Dec 29, 1940, MRN [id number] PCP: [place], Betty G, MD  [facility] Providers Cardiologist:  None Electrophysiologist:  Lanier Prude, MD {  History of Present Illness: .   [name] is a 82 y.o. male w/PMHx of HTN, AFib, SSSx w/PPM, TIAs  He saw Dr. Lalla Brothers 05/12/23, discussed of late recent concerns of HTN and recurrent TIA episodes.  No cardiac concerns, symptoms reported, normal device function, no changes made   Today's visit is scheduled as an annual visit  ROS: ***  *** eliquis, bleeding, dose, labs *** burden *** neuro? Alternate OAC?  Cause?  AF burden?  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 Left ventricular ejection fraction, by estimation, is 60 to 65%. The  left ventricle has normal function. The left ventricle has no regional  wall motion abnormalities. There is moderate concentric left ventricular  hypertrophy. Left ventricular  diastolic parameters are indeterminate.   2. Right ventricular systolic function is normal. The right ventricular  size is normal. There is normal pulmonary artery systolic pressure. The  estimated right ventricular systolic pressure is 31.9 mmHg.   3. The mitral valve is normal in structure. Trivial mitral valve  regurgitation. No evidence of mitral stenosis.   4. The aortic valve is normal in structure. Aortic valve regurgitation is  mild. No aortic stenosis is present.   5. The inferior vena cava is normal in size with greater than 50%  respiratory variability, suggesting right atrial pressure of 3 mmHg.    Risk Assessment/Calculations:    Physical Exam:   VS:   There were no vitals taken for this visit.   Wt Readings from Last 3 Encounters:  08/17/23 154 lb (69.9 kg)  08/16/23 154 lb (69.9 kg)  08/12/23 150 lb (68 kg)    GEN: Well nourished, well developed in no acute distress NECK: No JVD; No carotid bruits CARDIAC: ***RRR, no murmurs, rubs, gallops RESPIRATORY:  *** CTA b/l without rales, wheezing or rhonchi  ABDOMEN: Soft, non-tender, non-distended EXTREMITIES:  No edema; No deformity   PPM site: *** is stable, no thinning, fluctuation, tethering  ASSESSMENT AND PLAN: .    paroxysmal AFib CHA2DS2Vasc is 5, on Eliquis, *** appropriately dosed *** % burden  HTN ***  Secondary hypercoagulable state 2/2 AFib     {Are you ordering a CV Procedure (e.g. stress test, cath, DCCV, TEE, etc)?   Press F2        :[id number]}     Dispo: ***  Signed, Sheilah Pigeon, PA-C

## 2023-09-04 ENCOUNTER — Ambulatory Visit: Payer: Medicare Other | Attending: Cardiology | Admitting: Physician Assistant

## 2023-09-04 ENCOUNTER — Encounter: Payer: Self-pay | Admitting: Physician Assistant

## 2023-09-04 VITALS — BP 126/64 | HR 63 | Ht 66.0 in | Wt 154.4 lb

## 2023-09-04 DIAGNOSIS — D6869 Other thrombophilia: Secondary | ICD-10-CM | POA: Diagnosis present

## 2023-09-04 DIAGNOSIS — I480 Paroxysmal atrial fibrillation: Secondary | ICD-10-CM | POA: Insufficient documentation

## 2023-09-04 DIAGNOSIS — I10 Essential (primary) hypertension: Secondary | ICD-10-CM | POA: Insufficient documentation

## 2023-09-04 DIAGNOSIS — Z950 Presence of cardiac pacemaker: Secondary | ICD-10-CM | POA: Insufficient documentation

## 2023-09-04 NOTE — Patient Instructions (Signed)
Medication Instructions:   Your physician recommends that you continue on your current medications as directed. Please refer to the Current Medication list given to you today.   *If you need a refill on your cardiac medications before your next appointment, please call your pharmacy*   Lab Work:  None ordered.  If you have labs (blood work) drawn today and your tests are completely normal, you will receive your results only by: MyChart Message (if you have MyChart) OR A paper copy in the mail If you have any lab test that is abnormal or we need to change your treatment, we will call you to review the results.   Testing/Procedures:  None ordered.   Follow-Up: At [facility], you and your health needs are our priority.  As part of our continuing mission to provide you with exceptional heart care, we have created designated Provider Care Teams.  These Care Teams include your primary Cardiologist (physician) and Advanced Practice Providers (APPs -  Physician Assistants and Nurse Practitioners) who all work together to provide you with the care you need, when you need it.  We recommend signing up for the patient portal called "MyChart".  Sign up information is provided on this After Visit Summary.  MyChart is used to connect with patients for Virtual Visits (Telemedicine).  Patients are able to view lab/test results, encounter notes, upcoming appointments, etc.  Non-urgent messages can be sent to your provider as well.   To learn more about what you can do with MyChart, go to [email or web address].    Your next appointment:   6 month(s)  Provider:   You may see Lanier Prude, MD or one of the following Advanced Practice Providers on your designated Care Team:   Francis Dowse, [place]   Other Instructions  Your physician wants you to follow-up in: 6 months.  You will receive a reminder letter in the mail two months in advance. If you don't receive a letter, please call our  office to schedule the follow-up appointment.

## 2023-09-14 ENCOUNTER — Telehealth: Payer: Self-pay

## 2023-09-14 NOTE — Telephone Encounter (Signed)
Transition Care Management Follow-up Telephone Call Date of discharge and from where: 08/12/2023 [facility] How have you been since you were released from the hospital? Patient stated he is feeling much better. Any questions or concerns? No  Items Reviewed: Did the pt receive and understand the discharge instructions provided? Yes  Medications obtained and verified?  No medication prescribed, OTC if needed. Other? No  Any new allergies since your discharge? No  Dietary orders reviewed? Yes Do you have support at home? Yes   Follow up appointments reviewed:  PCP Hospital f/u appt confirmed? Yes  Scheduled to see Betty G. [place], MD on 08/17/2023 @ [facility] [place]. Specialist Hospital f/u appt confirmed? Yes  Scheduled to see  on 08/22/2023 @ [facility]. Are transportation arrangements needed? No  If their condition worsens, is the pt aware to call PCP or go to the Emergency Dept.? Yes Was the patient provided with contact information for the PCP's office or ED? Yes Was to pt encouraged to call back with questions or concerns? Yes   Willena Jeancharles [facility]  [facility], Advanced Pain Management Guide Direct Dial: [phone]  Website: [email or web address]

## 2023-09-25 ENCOUNTER — Ambulatory Visit (INDEPENDENT_AMBULATORY_CARE_PROVIDER_SITE_OTHER): Payer: Medicare Other

## 2023-09-25 DIAGNOSIS — I4892 Unspecified atrial flutter: Secondary | ICD-10-CM | POA: Diagnosis not present

## 2023-09-27 LAB — CUP PACEART REMOTE DEVICE CHECK
Battery Remaining Longevity: 30 mo
Battery Remaining Percentage: 25 %
Battery Voltage: 2.92 V
Brady Statistic AP VP Percent: 2.1 %
Brady Statistic AP VS Percent: 97 %
Brady Statistic AS VP Percent: 1 %
Brady Statistic AS VS Percent: 1 %
Brady Statistic RA Percent Paced: 98 %
Brady Statistic RV Percent Paced: 2.1 %
Date Time Interrogation Session: 20241112020014
Implantable Lead Connection Status: 753985
Implantable Lead Connection Status: 753985
Implantable Lead Implant Date: 20160928
Implantable Lead Implant Date: 20160928
Implantable Lead Location: 753859
Implantable Lead Location: 753860
Implantable Lead Model: 1948
Implantable Pulse Generator Implant Date: 20160928
Lead Channel Impedance Value: 400 Ohm
Lead Channel Impedance Value: 540 Ohm
Lead Channel Pacing Threshold Amplitude: 0.875 V
Lead Channel Pacing Threshold Amplitude: 1 V
Lead Channel Pacing Threshold Pulse Width: 0.4 ms
Lead Channel Pacing Threshold Pulse Width: 0.4 ms
Lead Channel Sensing Intrinsic Amplitude: 1.9 mV
Lead Channel Sensing Intrinsic Amplitude: 3.3 mV
Lead Channel Setting Pacing Amplitude: 1.25 V
Lead Channel Setting Pacing Amplitude: 1.875
Lead Channel Setting Pacing Pulse Width: 0.4 ms
Lead Channel Setting Sensing Sensitivity: 0.7 mV
Pulse Gen Model: 2240
Pulse Gen Serial Number: 7817736

## 2023-10-01 ENCOUNTER — Other Ambulatory Visit: Payer: Self-pay

## 2023-10-01 DIAGNOSIS — I6523 Occlusion and stenosis of bilateral carotid arteries: Secondary | ICD-10-CM

## 2023-10-17 ENCOUNTER — Ambulatory Visit (INDEPENDENT_AMBULATORY_CARE_PROVIDER_SITE_OTHER): Payer: Medicare Other | Admitting: Physician Assistant

## 2023-10-17 ENCOUNTER — Ambulatory Visit (HOSPITAL_COMMUNITY)
Admission: RE | Admit: 2023-10-17 | Discharge: 2023-10-17 | Disposition: A | Discharge status: Home / Self Care | Payer: Medicare Other | Source: Ambulatory Visit | Attending: Vascular Surgery | Admitting: Vascular Surgery

## 2023-10-17 VITALS — BP 153/80 | HR 63 | Temp 98.6°F | Ht 66.0 in | Wt 157.6 lb

## 2023-10-17 DIAGNOSIS — Z9889 Other specified postprocedural states: Secondary | ICD-10-CM

## 2023-10-17 DIAGNOSIS — I6523 Occlusion and stenosis of bilateral carotid arteries: Secondary | ICD-10-CM

## 2023-10-17 DIAGNOSIS — R600 Localized edema: Secondary | ICD-10-CM | POA: Diagnosis not present

## 2023-10-17 NOTE — Progress Notes (Signed)
Office Note     CC:  follow up Requesting Provider:  [place], Betty G, MD  HPI: [name] is a 82 y.o. (February 08, 1941) male who presents for surveillance follow up of carotid artery stenosis. He has a history of left CEA by Dr. Randie Heinz in November of 2021. This was for symptomatic carotid stenosis. We have been following his right ICA stenosis of 1-39%. He has had several TIA's since 2021. No CT evidence of Stroke. He is followed by Neurology as well.   Today he reports overall he is doing well. He is here with his wife.He has not had any recent TIA episodes. He has had several falls recently as his balance and mobility is worsening. He currently is going to PT several times a week and also takes some strength classes at the gym. He does feel that these are helping. He does mostly ambulate with Rolator now. He denies any visual changes or amaurosis, slurred speech, facial drooping, unilateral upper or lower extremity weakness or numbness. He is medically managed on Statin and Eliquis.   The pt is on a statin for cholesterol management.  The pt is on a daily aspirin. Other AC: Eliquis The pt is not on medication for hypertension The pt is not diabetic Tobacco hx:  Former  Past Medical History:  Diagnosis Date   Carotid artery occlusion    GERD (gastroesophageal reflux disease)    Hypertension    Stroke (HCC) 02/2021   TIA    Past Surgical History:  Procedure Laterality Date   CATARACT EXTRACTION, BILATERAL     ENDARTERECTOMY Left 09/16/2020   Procedure: LEFT ENDARTERECTOMY CAROTID;  Surgeon: Maeola Harman, MD;  Location: [facility] OR;  Service: Vascular;  Laterality: Left;   HERNIA REPAIR     PACEMAKER IMPLANT     TONSILLECTOMY      Social History   Socioeconomic History   Marital status: Married    Spouse name: Pat   Number of children: Not on file   Years of education: Not on file   Highest education level: Associate degree: academic program  Occupational History   Not  on file  Tobacco Use   Smoking status: Former    Passive exposure: Never   Smokeless tobacco: Never  Vaping Use   Vaping status: Never Used  Substance and Sexual Activity   Alcohol use: Not Currently    Comment: occassional   Drug use: Never   Sexual activity: Not Currently  Other Topics Concern   Not on file  Social History Narrative   Lives with wife   Social Determinants of Health   Financial Resource Strain: Low Risk  (05/11/2023)   Overall Financial Resource Strain (CARDIA)    Difficulty of Paying Living Expenses: Not hard at all  Food Insecurity: No Food Insecurity (05/11/2023)   Hunger Vital Sign    Worried About Running Out of Food in the Last Year: Never true    Ran Out of Food in the Last Year: Never true  Transportation Needs: No Transportation Needs (05/11/2023)   PRAPARE - Administrator, Civil Service (Medical): No    Lack of Transportation (Non-Medical): No  Physical Activity: Sufficiently Active (05/11/2023)   Exercise Vital Sign    Days of Exercise per Week: 5 days    Minutes of Exercise per Session: 30 min  Recent Concern: Physical Activity - Insufficiently Active (05/11/2023)   Exercise Vital Sign    Days of Exercise per Week: 2 days  Minutes of Exercise per Session: 30 min  Stress: No Stress Concern Present (05/11/2023)   [name] of Occupational Health - Occupational Stress Questionnaire    Feeling of Stress : Not at all  Social Connections: Socially Integrated (05/11/2023)   Social Connection and Isolation Panel [NHANES]    Frequency of Communication with Friends and Family: More than three times a week    Frequency of Social Gatherings with Friends and Family: More than three times a week    Attends Religious Services: More than 4 times per year    Active Member of [employer] or Organizations: Yes    Attends Engineer, structural: More than 4 times per year    Marital Status: Married  Catering manager Violence: Not At Risk  (05/11/2023)   Humiliation, Afraid, Rape, and Kick questionnaire    Fear of Current or Ex-Partner: No    Emotionally Abused: No    Physically Abused: No    Sexually Abused: No    Family History  Family history unknown: Yes    Current Outpatient Medications  Medication Sig Dispense Refill   acetaminophen (TYLENOL) 325 MG tablet Take 2 tablets (650 mg total) by mouth every 6 (six) hours as needed. 36 tablet 0   albuterol (VENTOLIN HFA) 108 (90 Base) MCG/ACT inhaler Inhale 2 puffs into the lungs 4 (four) times daily as needed for wheezing or shortness of breath. 8 g 1   amLODipine (NORVASC) 2.5 MG tablet TAKE 1 TABLET BY MOUTH EVERY DAY 90 tablet 1   ARNUITY ELLIPTA 200 MCG/ACT AEPB INHALE 1 PUFF INTO THE LUNGS EVERY MORNING. 90 each 3   atorvastatin (LIPITOR) 40 MG tablet Take 1 tablet (40 mg total) by mouth daily. 90 tablet 2   cholecalciferol (VITAMIN D) 25 MCG (1000 UNIT) tablet Take 1,000 Units by mouth at bedtime.     docusate sodium (COLACE) 50 MG capsule Take 50 mg by mouth 2 (two) times daily.     ELIQUIS 5 MG TABS tablet TAKE 1 TABLET BY MOUTH TWICE A DAY 180 tablet 1   Ferrous Sulfate (IRON) 325 (65 Fe) MG TABS Take 1 tablet by mouth at bedtime.     Magnesium 250 MG TABS Take 250 mg by mouth every morning.     Multiple Vitamin (MULTIVITAMIN WITH MINERALS) TABS tablet Take 1 tablet by mouth every morning.     Omega-3 Fatty Acids (FISH OIL PO) Take 1 capsule by mouth every morning.     omeprazole (PRILOSEC) 40 MG capsule TAKE 1 CAPSULE BY MOUTH DAILY BEFORE SUPPER. 90 capsule 2   TIMOLOL MALEATE OP Apply 1 drop to eye in the morning. 1 drop into both eyes every morning     No current facility-administered medications for this visit.    No Known Allergies   REVIEW OF SYSTEMS:  [X]  denotes positive finding, [ ]  denotes negative finding Cardiac  Comments:  Chest pain or chest pressure:    Shortness of breath upon exertion:    Short of breath when lying flat:    Irregular  heart rhythm:        Vascular    Pain in calf, thigh, or hip brought on by ambulation:    Pain in feet at night that wakes you up from your sleep:     Blood clot in your veins:    Leg swelling:         Pulmonary    Oxygen at home:    Productive cough:  Wheezing:         Neurologic    Sudden weakness in arms or legs:     Sudden numbness in arms or legs:     Sudden onset of difficulty speaking or slurred speech:    Temporary loss of vision in one eye:     Problems with dizziness:         Gastrointestinal    Blood in stool:     Vomited blood:         Genitourinary    Burning when urinating:     Blood in urine:        Psychiatric    Major depression:         Hematologic    Bleeding problems:    Problems with blood clotting too easily:        Skin    Rashes or ulcers:        Constitutional    Fever or chills:      PHYSICAL EXAMINATION:  Vitals:   10/17/23 1224 10/17/23 1226  BP: 136/86 (!) 153/80  Pulse: 63   Temp: 98.6 F (37 C)   TempSrc: Temporal   SpO2: 95%   Weight: 157 lb 9.6 oz (71.5 kg)   Height: 5\' 6"  (1.676 m)     General:  WDWN in NAD; vital signs documented above Gait: normal, uses Rolator HENT: WNL, normocephalic Pulmonary: normal non-labored breathing without wheezing Cardiac: regular HR Abdomen: soft, NT, no masses Vascular Exam/Pulses: 2+ radial pulses, 2+ DP pulses bilaterally. Feet warm and well perfused Extremities: without ischemic changes, without Gangrene , without cellulitis; without open wounds Musculoskeletal: no muscle wasting or atrophy  Neurologic: A&O X 3 Psychiatric:  The pt has Normal affect.   Non-Invasive Vascular Imaging:   VAS Carotid Duplex: Summary:  Right Carotid: Velocities in the right ICA are consistent with a 1-39% stenosis.   Left Carotid: Patent endarterectomy site with velocity in the left ICA consistent with a 1-39% stenosis.   Vertebrals:  Bilateral vertebral arteries demonstrate antegrade flow.   Subclavians: Normal flow hemodynamics were seen in bilateral subclavian arteries.    ASSESSMENT/PLAN:: 82 y.o. male here for surveillance follow up of carotid artery stenosis. He has a history of left CEA by Dr. Randie Heinz in November of 2021. This was for symptomatic carotid stenosis. He is without any TIA or stroke like symptoms currently. He continues to follow up with Neurology since he has history of multiple TIAs. His duplex today is stable with bilateral 1-39% ICA stenosis bilaterally. Normal flow in the vertebral and subclavian arteries bilaterally.  - Elevate bilateral legs  - Continue Statin and Eliquis - Reviewed signs and symptoms of TIA/ stroke and advised him to seek immediate medical attention should these occur - He will follow up in 1 year with repeat carotid duplex   Graceann Congress, PA-C [facility] [phone]  Clinic MD:   Randie Heinz

## 2023-10-19 ENCOUNTER — Other Ambulatory Visit: Payer: Self-pay

## 2023-10-19 DIAGNOSIS — I6523 Occlusion and stenosis of bilateral carotid arteries: Secondary | ICD-10-CM

## 2023-10-23 NOTE — Progress Notes (Signed)
Remote pacemaker transmission.   

## 2023-11-05 ENCOUNTER — Emergency Department (HOSPITAL_BASED_OUTPATIENT_CLINIC_OR_DEPARTMENT_OTHER)
Admission: EM | Admit: 2023-11-05 | Discharge: 2023-11-05 | Disposition: A | Discharge status: Home / Self Care | Payer: Medicare Other | Attending: Emergency Medicine | Admitting: Emergency Medicine

## 2023-11-05 ENCOUNTER — Encounter (HOSPITAL_BASED_OUTPATIENT_CLINIC_OR_DEPARTMENT_OTHER): Payer: Self-pay | Admitting: Emergency Medicine

## 2023-11-05 ENCOUNTER — Emergency Department (HOSPITAL_BASED_OUTPATIENT_CLINIC_OR_DEPARTMENT_OTHER): Payer: Medicare Other

## 2023-11-05 ENCOUNTER — Other Ambulatory Visit: Payer: Self-pay

## 2023-11-05 DIAGNOSIS — W01190A Fall on same level from slipping, tripping and stumbling with subsequent striking against furniture, initial encounter: Secondary | ICD-10-CM | POA: Diagnosis not present

## 2023-11-05 DIAGNOSIS — J45909 Unspecified asthma, uncomplicated: Secondary | ICD-10-CM | POA: Diagnosis not present

## 2023-11-05 DIAGNOSIS — Z7901 Long term (current) use of anticoagulants: Secondary | ICD-10-CM | POA: Insufficient documentation

## 2023-11-05 DIAGNOSIS — Z950 Presence of cardiac pacemaker: Secondary | ICD-10-CM | POA: Insufficient documentation

## 2023-11-05 DIAGNOSIS — S0990XA Unspecified injury of head, initial encounter: Secondary | ICD-10-CM | POA: Insufficient documentation

## 2023-11-05 DIAGNOSIS — Z7951 Long term (current) use of inhaled steroids: Secondary | ICD-10-CM | POA: Insufficient documentation

## 2023-11-05 DIAGNOSIS — Z79899 Other long term (current) drug therapy: Secondary | ICD-10-CM | POA: Insufficient documentation

## 2023-11-05 DIAGNOSIS — I509 Heart failure, unspecified: Secondary | ICD-10-CM | POA: Insufficient documentation

## 2023-11-05 DIAGNOSIS — Z8673 Personal history of transient ischemic attack (TIA), and cerebral infarction without residual deficits: Secondary | ICD-10-CM | POA: Diagnosis not present

## 2023-11-05 DIAGNOSIS — W19XXXA Unspecified fall, initial encounter: Secondary | ICD-10-CM

## 2023-11-05 DIAGNOSIS — I110 Hypertensive heart disease with heart failure: Secondary | ICD-10-CM | POA: Insufficient documentation

## 2023-11-05 DIAGNOSIS — I4891 Unspecified atrial fibrillation: Secondary | ICD-10-CM | POA: Insufficient documentation

## 2023-11-05 NOTE — Discharge Instructions (Signed)
Your history, exam, and evaluation today are consistent with a minor head injury after falling.  As you on blood thinners, you had CT imaging that did not show any acute intracranial abnormality specifically no bleeding or skull fracture.  We did discuss the chronic changes seen on your head CT and recommend follow-up with your primary doctor.  Please rest and stay hydrated.  If any symptoms change or worsen acutely, please return to the nearest emergency department.

## 2023-11-05 NOTE — ED Triage Notes (Signed)
Fell backward when getting out chair, hit head. On eliquis Happened at 5:30pm Bruising to left elbow. Aox4 GCS 15

## 2023-11-05 NOTE — ED Provider Notes (Signed)
[place] EMERGENCY DEPARTMENT AT [facility] Provider Note   CSN: [id number] Arrival date & time: 11/05/23  1813     History  Chief Complaint  Patient presents with   [name] is a 82 y.o. male.  The history is provided by the patient and medical records. No language interpreter was used.  Fall This is a new problem. The current episode started less than 1 hour ago. The problem occurs rarely. The problem has not changed since onset.Associated symptoms include headaches (resolved now). Pertinent negatives include no chest pain, no abdominal pain and no shortness of breath. Nothing aggravates the symptoms. Nothing relieves the symptoms. He has tried nothing for the symptoms. The treatment provided no relief.       Home Medications Prior to Admission medications   Medication Sig Start Date End Date Taking? Authorizing Provider  acetaminophen (TYLENOL) 325 MG tablet Take 2 tablets (650 mg total) by mouth every 6 (six) hours as needed. 03/04/22   Sloan Leiter, DO  albuterol (VENTOLIN HFA) 108 (90 Base) MCG/ACT inhaler Inhale 2 puffs into the lungs 4 (four) times daily as needed for wheezing or shortness of breath. 01/30/23   [place], Betty G, MD  amLODipine (NORVASC) 2.5 MG tablet TAKE 1 TABLET BY MOUTH EVERY DAY 07/20/23   [place], Betty G, MD  ARNUITY ELLIPTA 200 MCG/ACT AEPB INHALE 1 PUFF INTO THE LUNGS EVERY MORNING. 05/23/23   [place], Betty G, MD  atorvastatin (LIPITOR) 40 MG tablet Take 1 tablet (40 mg total) by mouth daily. 07/27/23   [place], Betty G, MD  cholecalciferol (VITAMIN D) 25 MCG (1000 UNIT) tablet Take 1,000 Units by mouth at bedtime.    [provider]  docusate sodium (COLACE) 50 MG capsule Take 50 mg by mouth 2 (two) times daily.    [provider]  ELIQUIS 5 MG TABS tablet TAKE 1 TABLET BY MOUTH TWICE A DAY 06/15/23   [place], Betty G, MD  Ferrous Sulfate (IRON) 325 (65 Fe) MG TABS Take 1 tablet by mouth at bedtime.    [provider]  Magnesium 250 MG TABS Take 250 mg by mouth every morning.    [provider]  Multiple Vitamin (MULTIVITAMIN WITH MINERALS) TABS tablet Take 1 tablet by mouth every morning.    [provider]  Omega-3 Fatty Acids (FISH OIL PO) Take 1 capsule by mouth every morning.    [provider]  omeprazole (PRILOSEC) 40 MG capsule TAKE 1 CAPSULE BY MOUTH DAILY BEFORE SUPPER. 01/19/23   [place], Betty G, MD  TIMOLOL MALEATE OP Apply 1 drop to eye in the morning. 1 drop into both eyes every morning    [provider]      Allergies    Patient has no known allergies.    Review of Systems   Review of Systems  Constitutional:  Negative for chills, fatigue and fever.  HENT:  Negative for congestion.   Eyes:  Negative for visual disturbance.  Respiratory:  Negative for cough, chest tightness and shortness of breath.   Cardiovascular:  Negative for chest pain and palpitations.  Gastrointestinal:  Negative for abdominal pain, constipation, diarrhea, nausea and vomiting.  Genitourinary:  Negative for dysuria and flank pain.  Musculoskeletal:  Negative for back pain, neck pain and neck stiffness.  Skin:  Negative for rash and wound.  Neurological:  Positive for headaches (resolved now). Negative for weakness, light-headedness and numbness.  Psychiatric/Behavioral:  Negative for agitation and  confusion.   All other systems reviewed and are negative.   Physical Exam Updated Vital Signs BP (!) 164/70   Pulse 61   SpO2 99%  Physical Exam Vitals and nursing note reviewed.  Constitutional:      General: He is not in acute distress.    Appearance: He is well-developed. He is not ill-appearing, toxic-appearing or diaphoretic.  HENT:     Head: Atraumatic.     Nose: No congestion or rhinorrhea.     Mouth/Throat:     Mouth: Mucous membranes are moist.     Pharynx: No oropharyngeal exudate or posterior oropharyngeal erythema.  Eyes:     Extraocular  Movements: Extraocular movements intact.     Conjunctiva/sclera: Conjunctivae normal.     Pupils: Pupils are equal, round, and reactive to light.  Cardiovascular:     Rate and Rhythm: Normal rate and regular rhythm.     Heart sounds: No murmur heard. Pulmonary:     Effort: Pulmonary effort is normal. No respiratory distress.     Breath sounds: Normal breath sounds. No wheezing, rhonchi or rales.  Chest:     Chest wall: No tenderness.  Abdominal:     General: Abdomen is flat.     Palpations: Abdomen is soft.     Tenderness: There is no abdominal tenderness. There is no right CVA tenderness, left CVA tenderness, guarding or rebound.  Musculoskeletal:        General: No swelling or tenderness.     Cervical back: Neck supple.     Right lower leg: No edema.     Left lower leg: No edema.  Skin:    General: Skin is warm and dry.     Capillary Refill: Capillary refill takes less than 2 seconds.     Findings: No erythema.  Neurological:     General: No focal deficit present.     Mental Status: He is alert.     Sensory: No sensory deficit.     Motor: No weakness.  Psychiatric:        Mood and Affect: Mood normal.    ED Results / Procedures / Treatments   Labs (all labs ordered are listed, but only abnormal results are displayed) Labs Reviewed - No data to display  EKG None  Radiology CT Head Wo Contrast Result Date: 11/05/2023 CLINICAL DATA:  Status post trauma. EXAM: CT HEAD WITHOUT CONTRAST TECHNIQUE: Contiguous axial images were obtained from the base of the skull through the vertex without intravenous contrast. RADIATION DOSE REDUCTION: This exam was performed according to the departmental dose-optimization program which includes automated exposure control, adjustment of the mA and/or kV according to patient size and/or use of iterative reconstruction technique. COMPARISON:  August 12, 2023 FINDINGS: Brain: There is mild to moderate severity cerebral atrophy with widening of  the extra-axial spaces and ventricular dilatation. There are areas of decreased attenuation within the white matter tracts of the supratentorial brain, consistent with microvascular disease changes. Vascular: No hyperdense vessel or unexpected calcification. Skull: Normal. Negative for fracture or focal lesion. Sinuses/Orbits: No acute finding. Other: None. IMPRESSION: 1. No acute intracranial abnormality. 2. Generalized cerebral atrophy and microvascular disease changes of the supratentorial brain. Electronically Signed   By: Aram Candela M.D.   On: 11/05/2023 20:24    Procedures Procedures    Medications Ordered in ED Medications - No data to display  ED Course/ Medical Decision Making/ A&P  Medical Decision Making Amount and/or Complexity of Data Reviewed Radiology: ordered.    [name] is a 82 y.o. male with a past medical history significant for hypertension, pacemaker, CHF, asthma, GERD, A-fib on Eliquis therapy, previous TIA, and EMEA who presents with head injury after a fall.  According to patient, he stood up from a lazy boy chair and fell backwards hitting a book stand and magazine stand.  He did not get knocked out but did get "dazed" for a few seconds.  Reports no vision changes, nausea, vomiting.  Reports no face pain neck pain or torso pain.  Reports mild headache that resolved.  He reports he is feeling well now but due to his blood thinner use his family wanted him to get assessed.   Patient reports he has no headache now.  Any speech or vision changes.  Denies any preceding symptoms such as fevers, chills, chills, cough, nausea, vomiting, or other complaints.  On exam, lungs clear and chest nontender.  Abdomen nontender.  Back nontender.  Neck nontender.  Head nontender with no laceration or large injury seen.  Pupils symmetric and reactive with normal extract movements.  Intact sensation strength and pulse in extremities.  Symmetric  smile.  Patient otherwise well-appearing.  Patient will get CT head to look for skull fracture reassuring, anticipate discharge home.  Given lack of preceding symptoms, fail agrees to hold on other workup at this time.            9:27 PM CT head is reassuring.  No evidence of intracranial injury or bleed.  We discussed the chronic changes and findings on the CT.  Blood pressure on reassessment was in the 140s.    Due to reassuring imaging we feel patient safe for discharge home.  Will follow-up with his PCP.  They have no other questions or concerns and patient was discharged in good condition.           Final Clinical Impression(s) / ED Diagnoses Final diagnoses:  Fall, initial encounter  Injury of head, initial encounter    Rx / DC Orders ED Discharge Orders     None      Clinical Impression: 1. Fall, initial encounter   2. Injury of head, initial encounter     Disposition: Discharge  Condition: Good  I have discussed the results, Dx and Tx plan with the pt(& family if present). He/she/they expressed understanding and agree(s) with the plan. Discharge instructions discussed at great length. Strict return precautions discussed and pt &/or family have verbalized understanding of the instructions. No further questions at time of discharge.    New Prescriptions   No medications on file    Follow Up: [place], Betty G, MD [address] [place] [address] [phone]     [facility] Emergency Department at [facility] [address] [place] [address] [phone]       Jakoby Melendrez, Canary Brim, MD 11/05/23 2220

## 2023-12-13 ENCOUNTER — Ambulatory Visit: Payer: Self-pay | Admitting: Family Medicine

## 2023-12-13 NOTE — Telephone Encounter (Signed)
  Chief Complaint: Congestion Symptoms: sinus congestion, fever Frequency: Ongoing since yesterday evening Pertinent Negatives: Patient denies N/V, SOB Disposition: [] ED /[] Urgent Care (no appt availability in office) / [] Appointment(In office/virtual)/ []  [facility]/ [x] Home Care/ [] Refused Recommended Disposition /[] [facility]/ []  Follow-up with PCP Additional Notes: Pt's spouse reports pt has been experiencing sinus pressure/pain/congestion since yesterday evening. Pt had a fever this AM of 100.2, it has since resolved to 99 without the use of medications. Home care advised. This RN educated pt on home care, new-worsening symptoms, when to call back/seek emergent care. Pt verbalized understanding and agrees to plan.    Copied from CRM [id number]. Topic: Clinical - Medical Advice >> Dec 13, 2023 10:42 AM Shelbie Proctor wrote: Reason for CRM: Patient's wife Elease Hashimoto [phone] states he has a fever 100.2, and frequent urination- no pain or burning sensation. Patient denies shortness, dizziness, chills, pain. Pt has hypertension and wanted to see if he could take Mucinex or something better? Reason for Disposition  [1] Sinus congestion as part of a cold AND [2] present < 10 days  Answer Assessment - Initial Assessment Questions 1. LOCATION: "Where does it hurt?"      Nasal congestion 2. ONSET: "When did the sinus pain start?"  (e.g., hours, days)      Last evening  6. NASAL DISCHARGE: "Do you have discharge from your nose?" If so ask, "What color?"     Spouse unsure 7. FEVER: "Do you have a fever?" If Yes, ask: "What is it, how was it measured, and when did it start?"      100.2 this AM, 99 this afternoon without medication 8. OTHER SYMPTOMS: "Do you have any other symptoms?" (e.g., sore throat, cough, earache, difficulty breathing)  Protocols used: Sinus Pain or Congestion-A-AH

## 2023-12-25 ENCOUNTER — Telehealth: Payer: Self-pay

## 2023-12-25 ENCOUNTER — Ambulatory Visit (INDEPENDENT_AMBULATORY_CARE_PROVIDER_SITE_OTHER): Payer: Medicare Other

## 2023-12-25 DIAGNOSIS — I5032 Chronic diastolic (congestive) heart failure: Secondary | ICD-10-CM | POA: Diagnosis not present

## 2023-12-25 DIAGNOSIS — I480 Paroxysmal atrial fibrillation: Secondary | ICD-10-CM

## 2023-12-25 NOTE — Telephone Encounter (Signed)
Copied from CRM [id number]. Topic: Clinical - Prescription Issue >> Dec 25, 2023  4:15 PM Isabell A wrote: Reason for CRM: Patient states the pharmacy was charging him $254.71 for ELIQUIS 5 MG TABS tablet, states it should've been $141. Due to the new year. Pharmacy advised him to speak with his provider.

## 2023-12-26 ENCOUNTER — Telehealth: Payer: Self-pay

## 2023-12-26 NOTE — Progress Notes (Signed)
   12/26/2023  Patient ID: [name], male   DOB: [birth year], 83 y.o.   MRN: [id number]  Contacted patient's insurance company to discuss cost of eliquis and to determine if patient has a deductible to meet for 2025.  Confirmed patient has already met deductible.  Eliquis (and all other DOACs) are Tier 3 and patient will be charged 15% of the drug cost each time he picks up. This means cost of drug is not set and can fluctuate based on which pharmacy the patient uses and cost of drug at that time.  Spoke with patient. Does not qualify for PAP. Not interested in switching to warfarin at this time and will continue on Eliquis.  Sherrill Raring, PharmD Clinical Pharmacist [phone]

## 2023-12-26 NOTE — Telephone Encounter (Signed)
Spoke with [employer]. They confirmed that the $254.71 is the correct price. He has a unique insurance that charges him a set percentage of the drugs cost and therefore price can fluctuate as the cost of drugs change. No cheaper alternatives other than warfarin and patient does not want to consider that at this time. Does not qualify for PAP. Reviewed information with patient and he confirms he can continue on the Eliquis at this price.   Informed patient to contact us if this changes.

## 2023-12-27 ENCOUNTER — Encounter: Payer: Self-pay | Admitting: Cardiology

## 2023-12-27 LAB — CUP PACEART REMOTE DEVICE CHECK
Battery Remaining Longevity: 28 mo
Battery Remaining Percentage: 22 %
Battery Voltage: 2.9 V
Brady Statistic AP VP Percent: 1.6 %
Brady Statistic AP VS Percent: 97 %
Brady Statistic AS VP Percent: 1 %
Brady Statistic AS VS Percent: 1.2 %
Brady Statistic RA Percent Paced: 98 %
Brady Statistic RV Percent Paced: 1.6 %
Date Time Interrogation Session: 20250211020013
Implantable Lead Connection Status: 753985
Implantable Lead Connection Status: 753985
Implantable Lead Implant Date: 20160928
Implantable Lead Implant Date: 20160928
Implantable Lead Location: 753859
Implantable Lead Location: 753860
Implantable Lead Model: 1948
Implantable Pulse Generator Implant Date: 20160928
Lead Channel Impedance Value: 390 Ohm
Lead Channel Impedance Value: 530 Ohm
Lead Channel Pacing Threshold Amplitude: 0.75 V
Lead Channel Pacing Threshold Amplitude: 1 V
Lead Channel Pacing Threshold Pulse Width: 0.4 ms
Lead Channel Pacing Threshold Pulse Width: 0.4 ms
Lead Channel Sensing Intrinsic Amplitude: 1.9 mV
Lead Channel Sensing Intrinsic Amplitude: 3.1 mV
Lead Channel Setting Pacing Amplitude: 1.25 V
Lead Channel Setting Pacing Amplitude: 1.75 V
Lead Channel Setting Pacing Pulse Width: 0.4 ms
Lead Channel Setting Sensing Sensitivity: 0.7 mV
Pulse Gen Model: 2240
Pulse Gen Serial Number: 7817736

## 2024-01-15 ENCOUNTER — Other Ambulatory Visit: Payer: Self-pay | Admitting: Family Medicine

## 2024-01-15 DIAGNOSIS — I480 Paroxysmal atrial fibrillation: Secondary | ICD-10-CM

## 2024-01-23 ENCOUNTER — Other Ambulatory Visit: Payer: Self-pay

## 2024-01-23 ENCOUNTER — Emergency Department (HOSPITAL_COMMUNITY)

## 2024-01-23 ENCOUNTER — Encounter (HOSPITAL_COMMUNITY): Payer: Self-pay

## 2024-01-23 ENCOUNTER — Inpatient Hospital Stay (HOSPITAL_COMMUNITY)
Admission: EM | Admit: 2024-01-23 | Discharge: 2024-01-26 | DRG: 392 | Disposition: A | Discharge status: Nursing Home (Includes ICF) | Source: Skilled Nursing Facility | Attending: Internal Medicine | Admitting: Internal Medicine

## 2024-01-23 DIAGNOSIS — Z7901 Long term (current) use of anticoagulants: Secondary | ICD-10-CM

## 2024-01-23 DIAGNOSIS — Z79899 Other long term (current) drug therapy: Secondary | ICD-10-CM

## 2024-01-23 DIAGNOSIS — I110 Hypertensive heart disease with heart failure: Secondary | ICD-10-CM | POA: Diagnosis present

## 2024-01-23 DIAGNOSIS — I5032 Chronic diastolic (congestive) heart failure: Secondary | ICD-10-CM | POA: Diagnosis present

## 2024-01-23 DIAGNOSIS — T17908A Unspecified foreign body in respiratory tract, part unspecified causing other injury, initial encounter: Secondary | ICD-10-CM | POA: Diagnosis present

## 2024-01-23 DIAGNOSIS — A0811 Acute gastroenteropathy due to Norwalk agent: Principal | ICD-10-CM | POA: Diagnosis present

## 2024-01-23 DIAGNOSIS — J4520 Mild intermittent asthma, uncomplicated: Secondary | ICD-10-CM | POA: Diagnosis present

## 2024-01-23 DIAGNOSIS — K219 Gastro-esophageal reflux disease without esophagitis: Secondary | ICD-10-CM | POA: Diagnosis present

## 2024-01-23 DIAGNOSIS — H9190 Unspecified hearing loss, unspecified ear: Secondary | ICD-10-CM | POA: Diagnosis present

## 2024-01-23 DIAGNOSIS — R1084 Generalized abdominal pain: Secondary | ICD-10-CM | POA: Diagnosis not present

## 2024-01-23 DIAGNOSIS — D649 Anemia, unspecified: Secondary | ICD-10-CM | POA: Diagnosis present

## 2024-01-23 DIAGNOSIS — I5030 Unspecified diastolic (congestive) heart failure: Secondary | ICD-10-CM | POA: Diagnosis present

## 2024-01-23 DIAGNOSIS — Z7951 Long term (current) use of inhaled steroids: Secondary | ICD-10-CM

## 2024-01-23 DIAGNOSIS — E860 Dehydration: Secondary | ICD-10-CM | POA: Diagnosis present

## 2024-01-23 DIAGNOSIS — D696 Thrombocytopenia, unspecified: Secondary | ICD-10-CM | POA: Diagnosis present

## 2024-01-23 DIAGNOSIS — G459 Transient cerebral ischemic attack, unspecified: Secondary | ICD-10-CM | POA: Diagnosis present

## 2024-01-23 DIAGNOSIS — I739 Peripheral vascular disease, unspecified: Secondary | ICD-10-CM | POA: Diagnosis present

## 2024-01-23 DIAGNOSIS — N179 Acute kidney failure, unspecified: Secondary | ICD-10-CM | POA: Diagnosis present

## 2024-01-23 DIAGNOSIS — R197 Diarrhea, unspecified: Principal | ICD-10-CM | POA: Diagnosis present

## 2024-01-23 DIAGNOSIS — A046 Enteritis due to Yersinia enterocolitica: Secondary | ICD-10-CM | POA: Diagnosis present

## 2024-01-23 DIAGNOSIS — Z87891 Personal history of nicotine dependence: Secondary | ICD-10-CM

## 2024-01-23 DIAGNOSIS — Z751 Person awaiting admission to adequate facility elsewhere: Secondary | ICD-10-CM

## 2024-01-23 DIAGNOSIS — Z8673 Personal history of transient ischemic attack (TIA), and cerebral infarction without residual deficits: Secondary | ICD-10-CM

## 2024-01-23 DIAGNOSIS — I480 Paroxysmal atrial fibrillation: Secondary | ICD-10-CM | POA: Diagnosis present

## 2024-01-23 DIAGNOSIS — I495 Sick sinus syndrome: Secondary | ICD-10-CM | POA: Diagnosis present

## 2024-01-23 DIAGNOSIS — Z950 Presence of cardiac pacemaker: Secondary | ICD-10-CM

## 2024-01-23 DIAGNOSIS — R112 Nausea with vomiting, unspecified: Principal | ICD-10-CM | POA: Diagnosis present

## 2024-01-23 DIAGNOSIS — R6510 Systemic inflammatory response syndrome (SIRS) of non-infectious origin without acute organ dysfunction: Secondary | ICD-10-CM | POA: Diagnosis present

## 2024-01-23 DIAGNOSIS — Z1152 Encounter for screening for COVID-19: Secondary | ICD-10-CM

## 2024-01-23 DIAGNOSIS — H409 Unspecified glaucoma: Secondary | ICD-10-CM | POA: Diagnosis present

## 2024-01-23 LAB — RESP PANEL BY RT-PCR (RSV, FLU A&B, COVID)  RVPGX2
Influenza A by PCR: NEGATIVE
Influenza B by PCR: NEGATIVE
Resp Syncytial Virus by PCR: NEGATIVE
SARS Coronavirus 2 by RT PCR: NEGATIVE

## 2024-01-23 LAB — CBC WITH DIFFERENTIAL/PLATELET
Abs Immature Granulocytes: 0.06 10*3/uL (ref 0.00–0.07)
Basophils Absolute: 0 10*3/uL (ref 0.0–0.1)
Basophils Relative: 0 %
Eosinophils Absolute: 0 10*3/uL (ref 0.0–0.5)
Eosinophils Relative: 0 %
HCT: 36.7 % — ABNORMAL LOW (ref 39.0–52.0)
Hemoglobin: 12.7 g/dL — ABNORMAL LOW (ref 13.0–17.0)
Immature Granulocytes: 0 %
Lymphocytes Relative: 4 %
Lymphs Abs: 0.6 10*3/uL — ABNORMAL LOW (ref 0.7–4.0)
MCH: 33.5 pg (ref 26.0–34.0)
MCHC: 34.6 g/dL (ref 30.0–36.0)
MCV: 96.8 fL (ref 80.0–100.0)
Monocytes Absolute: 0.7 10*3/uL (ref 0.1–1.0)
Monocytes Relative: 5 %
Neutro Abs: 12.9 10*3/uL — ABNORMAL HIGH (ref 1.7–7.7)
Neutrophils Relative %: 91 %
Platelets: 137 10*3/uL — ABNORMAL LOW (ref 150–400)
RBC: 3.79 MIL/uL — ABNORMAL LOW (ref 4.22–5.81)
RDW: 13.7 % (ref 11.5–15.5)
WBC: 14.3 10*3/uL — ABNORMAL HIGH (ref 4.0–10.5)
nRBC: 0 % (ref 0.0–0.2)

## 2024-01-23 LAB — COMPREHENSIVE METABOLIC PANEL
ALT: 22 U/L (ref 0–44)
AST: 28 U/L (ref 15–41)
Albumin: 2.9 g/dL — ABNORMAL LOW (ref 3.5–5.0)
Alkaline Phosphatase: 63 U/L (ref 38–126)
Anion gap: 9 (ref 5–15)
BUN: 35 mg/dL — ABNORMAL HIGH (ref 8–23)
CO2: 22 mmol/L (ref 22–32)
Calcium: 7.8 mg/dL — ABNORMAL LOW (ref 8.9–10.3)
Chloride: 104 mmol/L (ref 98–111)
Creatinine, Ser: 1.33 mg/dL — ABNORMAL HIGH (ref 0.61–1.24)
GFR, Estimated: 53 mL/min — ABNORMAL LOW (ref >60)
Glucose, Bld: 100 mg/dL — ABNORMAL HIGH (ref 70–99)
Potassium: 3.8 mmol/L (ref 3.5–5.1)
Sodium: 135 mmol/L (ref 135–145)
Total Bilirubin: 1 mg/dL (ref 0.0–1.2)
Total Protein: 5.4 g/dL — ABNORMAL LOW (ref 6.5–8.1)

## 2024-01-23 LAB — MAGNESIUM: Magnesium: 1.7 mg/dL (ref 1.7–2.4)

## 2024-01-23 LAB — LIPASE, BLOOD: Lipase: 22 U/L (ref 11–51)

## 2024-01-23 LAB — I-STAT CG4 LACTIC ACID, ED: Lactic Acid, Venous: 1.1 mmol/L (ref 0.5–1.9)

## 2024-01-23 LAB — POC OCCULT BLOOD, ED: Fecal Occult Bld: NEGATIVE

## 2024-01-23 MED ORDER — SODIUM CHLORIDE 0.9 % IV BOLUS
1000.0000 mL | Freq: Once | INTRAVENOUS | Status: AC
  Administered 2024-01-23: 1000 mL via INTRAVENOUS

## 2024-01-23 MED ORDER — IOHEXOL 350 MG/ML SOLN
75.0000 mL | Freq: Once | INTRAVENOUS | Status: AC | PRN
  Administered 2024-01-23: 75 mL via INTRAVENOUS

## 2024-01-23 MED ORDER — ONDANSETRON HCL 4 MG/2ML IJ SOLN
4.0000 mg | Freq: Once | INTRAMUSCULAR | Status: DC
  Filled 2024-01-23: qty 2

## 2024-01-23 MED ORDER — IPRATROPIUM-ALBUTEROL 0.5-2.5 (3) MG/3ML IN SOLN
3.0000 mL | Freq: Once | RESPIRATORY_TRACT | Status: AC
  Administered 2024-01-23: 3 mL via RESPIRATORY_TRACT
  Filled 2024-01-23: qty 3

## 2024-01-23 NOTE — ED Triage Notes (Signed)
 Pt bib ems from home c/o N/V/D/Fever that started around 2 am. Pt unsure how many episodes but endorses many. Pt feels weak and fatigued. Pt initial BP 80/46 received 400 cc NS.  BP 100/52 HR 80 RR 18 RA 91% placed on 2L 94% CBG 127 20 G LfAC

## 2024-01-23 NOTE — ED Provider Notes (Signed)
 [place] EMERGENCY DEPARTMENT AT [facility] Provider Note   CSN: [id number] Arrival date & time: 01/23/24  1428     History {Add pertinent medical, surgical, social history, OB history to HPI:1} Chief Complaint  Patient presents with  . Nausea  . Weakness  . Emesis  . Fever  . Diarrhea    [name] is a 83 y.o. male with PMHx of HTN, pacemaker, anemia, TIA, PAF, HFpEF, asthma presents to ED for evaluation of vomiting, diarrhea, producitve cough that started at 0200 this morning. He endorses 4 episodes of vomiting and 7-8 episodes of diarrhea. At 1000, wife reports fever of 103F which she provided him with Tylnol   Weakness Associated symptoms: diarrhea, fever and vomiting   Emesis Associated symptoms: diarrhea and fever   Fever Associated symptoms: diarrhea and vomiting   Diarrhea Associated symptoms: fever and vomiting        Home Medications Prior to Admission medications   Medication Sig Start Date End Date Taking? Authorizing Provider  acetaminophen (TYLENOL) 325 MG tablet Take 2 tablets (650 mg total) by mouth every 6 (six) hours as needed. 03/04/22   Sloan Leiter, DO  albuterol (VENTOLIN HFA) 108 (90 Base) MCG/ACT inhaler Inhale 2 puffs into the lungs 4 (four) times daily as needed for wheezing or shortness of breath. 01/30/23   [place], Betty G, MD  amLODipine (NORVASC) 2.5 MG tablet TAKE 1 TABLET BY MOUTH EVERY DAY 07/20/23   [place], Betty G, MD  ARNUITY ELLIPTA 200 MCG/ACT AEPB INHALE 1 PUFF INTO THE LUNGS EVERY MORNING. 05/23/23   [place], Betty G, MD  atorvastatin (LIPITOR) 40 MG tablet Take 1 tablet (40 mg total) by mouth daily. 07/27/23   [place], Betty G, MD  cholecalciferol (VITAMIN D) 25 MCG (1000 UNIT) tablet Take 1,000 Units by mouth at bedtime.    [provider]  docusate sodium (COLACE) 50 MG capsule Take 50 mg by mouth 2 (two) times daily.    [provider]  ELIQUIS 5 MG TABS tablet TAKE 1 TABLET BY MOUTH TWICE A DAY  01/15/24   [place], Betty G, MD  Ferrous Sulfate (IRON) 325 (65 Fe) MG TABS Take 1 tablet by mouth at bedtime.    [provider]  Magnesium 250 MG TABS Take 250 mg by mouth every morning.    [provider]  Multiple Vitamin (MULTIVITAMIN WITH MINERALS) TABS tablet Take 1 tablet by mouth every morning.    [provider]  Omega-3 Fatty Acids (FISH OIL PO) Take 1 capsule by mouth every morning.    [provider]  omeprazole (PRILOSEC) 40 MG capsule TAKE 1 CAPSULE BY MOUTH DAILY BEFORE SUPPER. Patient taking differently: Take 40 mg by mouth daily. 01/15/24   [place], Betty G, MD  TIMOLOL MALEATE OP Apply 1 drop to eye in the morning. 1 drop into both eyes every morning    [provider]      Allergies    Patient has no known allergies.    Review of Systems   Review of Systems  Constitutional:  Positive for fever.  Gastrointestinal:  Positive for diarrhea and vomiting.  Neurological:  Positive for weakness.    Physical Exam Updated Vital Signs BP (!) 112/46   Pulse 65   Temp 98.5 F (36.9 C) (Oral)   Resp (!) 21   Ht 5\' 6"  (1.676 m)   Wt 69.9 kg   SpO2 94%   BMI 24.86 kg/m  Physical Exam Vitals and  nursing note reviewed.  Constitutional:      General: He is not in acute distress.    Appearance: Normal appearance.  HENT:     Head: Normocephalic and atraumatic.  Eyes:     Conjunctiva/sclera: Conjunctivae normal.  Cardiovascular:     Rate and Rhythm: Normal rate.  Pulmonary:     Effort: Pulmonary effort is normal. No respiratory distress.     Breath sounds: Wheezing (expiratory in all lobes) present.  Musculoskeletal:     Right lower leg: No edema.     Left lower leg: No edema.  Skin:    General: Skin is warm.     Capillary Refill: Capillary refill takes less than 2 seconds.     Coloration: Skin is not jaundiced or pale.  Neurological:     Mental Status: He is alert and oriented to person, place, and time. Mental status is at  baseline.    ED Results / Procedures / Treatments   Labs (all labs ordered are listed, but only abnormal results are displayed) Labs Reviewed  CBC WITH DIFFERENTIAL/PLATELET - Abnormal; Notable for the following components:      Result Value   WBC 14.3 (*)    RBC 3.79 (*)    Hemoglobin 12.7 (*)    HCT 36.7 (*)    Platelets 137 (*)    Neutro Abs 12.9 (*)    Lymphs Abs 0.6 (*)    All other components within normal limits  COMPREHENSIVE METABOLIC PANEL - Abnormal; Notable for the following components:   Glucose, Bld 100 (*)    BUN 35 (*)    Creatinine, Ser 1.33 (*)    Calcium 7.8 (*)    Total Protein 5.4 (*)    Albumin 2.9 (*)    GFR, Estimated 53 (*)    All other components within normal limits  RESP PANEL BY RT-PCR (RSV, FLU A&B, COVID)  RVPGX2  LIPASE, BLOOD  MAGNESIUM  POC OCCULT BLOOD, ED    EKG None  Radiology CT ABDOMEN PELVIS W CONTRAST Result Date: 01/23/2024 CLINICAL DATA:  Acute nonlocalized abdominal pain EXAM: CT ABDOMEN AND PELVIS WITH CONTRAST TECHNIQUE: Multidetector CT imaging of the abdomen and pelvis was performed using the standard protocol following bolus administration of intravenous contrast. RADIATION DOSE REDUCTION: This exam was performed according to the departmental dose-optimization program which includes automated exposure control, adjustment of the mA and/or kV according to patient size and/or use of iterative reconstruction technique. CONTRAST:  75mL OMNIPAQUE IOHEXOL 350 MG/ML SOLN COMPARISON:  None Available. FINDINGS: Lower chest: There is bronchial wall thickening in keeping with airway inflammation and peribronchial infiltrate within the right middle and lower lobes which may relate to changes of multifocal infection or aspiration. Large hiatal hernia. Pacemaker leads are seen within the right heart. Extensive right coronary artery calcification. Hepatobiliary: Scattered hypodensities are seen throughout the liver which are too small accurately  characterize likely represent cysts in a patient without history of malignancy. The liver is otherwise unremarkable. Gallbladder. No intra or extrahepatic biliary ductal dilation. Pancreas: Unremarkable Spleen: Unremarkable Adrenals/Urinary Tract: Adrenal glands are unremarkable. Kidneys are normal, without renal calculi, focal lesion, or hydronephrosis. Bladder is unremarkable. Stomach/Bowel: Stomach is largely thoracic in position. Small large bowel are unremarkable. Appendix normal. No free intraperitoneal gas or fluid. Vascular/Lymphatic: Aortic atherosclerosis. No enlarged abdominal or pelvic lymph nodes. Reproductive: Prostate is unremarkable. Other: Tiny fat containing bilateral hernias. Musculoskeletal: Osseous structures are diffusely osteopenic. 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 No acute bone abnormality IMPRESSION: 1. No acute intra-abdominal pathology identified. 2. Bronchial wall thickening in keeping with airway inflammation and peribronchial infiltrate within the right middle and lower lobes which may relate to changes of multifocal infection or aspiration. 3. Large hiatal hernia. 4. Extensive right coronary artery calcification. Aortic Atherosclerosis (ICD10-I70.0). Electronically Signed   By: Helyn Numbers M.D.   On: 01/23/2024 21:21   DG Chest 2 View Result Date: 01/23/2024 CLINICAL DATA:  Cough. EXAM: CHEST - 2 VIEW COMPARISON:  August 12, 2023. FINDINGS: The heart size and mediastinal contours are within normal limits. Left-sided pacemaker is unchanged. Minimal bibasilar subsegmental atelectasis is noted. Moderate size hiatal hernia is noted. The visualized skeletal structures are unremarkable. IMPRESSION: Hiatal hernia.  Minimal bibasilar subsegmental atelectasis. Electronically Signed   By: Lupita Raider M.D.   On: 01/23/2024 18:45    Procedures Procedures  {Document cardiac monitor, telemetry assessment  procedure when appropriate:1}  Medications Ordered in ED Medications  ondansetron (ZOFRAN) injection 4 mg (0 mg Intravenous Hold 01/23/24 1814)  sodium chloride 0.9 % bolus 1,000 mL (0 mLs Intravenous Stopped 01/23/24 1815)  iohexol (OMNIPAQUE) 350 MG/ML injection 75 mL (75 mLs Intravenous Contrast Given 01/23/24 1855)    ED Course/ Medical Decision Making/ A&P   {   Click here for ABCD2, HEART and other calculatorsREFRESH Note before signing :1}                              Medical Decision Making Amount and/or Complexity of Data Reviewed Radiology: ordered.  Risk Prescription drug management.   ***  {Document critical care time when appropriate:1} {Document review of labs and clinical decision tools ie heart score, Chads2Vasc2 etc:1}  {Document your independent review of radiology images, and any outside records:1} {Document your discussion with family members, caretakers, and with consultants:1} {Document social determinants of health affecting pt's care:1} {Document your decision making why or why not admission, treatments were needed:1} Final Clinical Impression(s) / ED Diagnoses Final diagnoses:  None    Rx / DC Orders ED Discharge Orders     None

## 2024-01-23 NOTE — ED Notes (Signed)
 RN and NT provided pericare and new brief. Pt had dark ashen stools. Hemoccult sample collected.

## 2024-01-23 NOTE — ED Notes (Signed)
 1st lac 1.13 in normal range 2nd not needed can be canceled

## 2024-01-24 ENCOUNTER — Encounter (HOSPITAL_COMMUNITY): Payer: Self-pay | Admitting: Internal Medicine

## 2024-01-24 DIAGNOSIS — I480 Paroxysmal atrial fibrillation: Secondary | ICD-10-CM | POA: Diagnosis present

## 2024-01-24 DIAGNOSIS — D649 Anemia, unspecified: Secondary | ICD-10-CM | POA: Diagnosis present

## 2024-01-24 DIAGNOSIS — D696 Thrombocytopenia, unspecified: Secondary | ICD-10-CM | POA: Diagnosis present

## 2024-01-24 DIAGNOSIS — R6510 Systemic inflammatory response syndrome (SIRS) of non-infectious origin without acute organ dysfunction: Secondary | ICD-10-CM | POA: Diagnosis present

## 2024-01-24 DIAGNOSIS — H9190 Unspecified hearing loss, unspecified ear: Secondary | ICD-10-CM | POA: Diagnosis present

## 2024-01-24 DIAGNOSIS — Z950 Presence of cardiac pacemaker: Secondary | ICD-10-CM | POA: Diagnosis not present

## 2024-01-24 DIAGNOSIS — E860 Dehydration: Secondary | ICD-10-CM | POA: Diagnosis present

## 2024-01-24 DIAGNOSIS — H409 Unspecified glaucoma: Secondary | ICD-10-CM | POA: Diagnosis present

## 2024-01-24 DIAGNOSIS — Z1152 Encounter for screening for COVID-19: Secondary | ICD-10-CM | POA: Diagnosis not present

## 2024-01-24 DIAGNOSIS — N179 Acute kidney failure, unspecified: Secondary | ICD-10-CM

## 2024-01-24 DIAGNOSIS — I495 Sick sinus syndrome: Secondary | ICD-10-CM | POA: Diagnosis present

## 2024-01-24 DIAGNOSIS — A046 Enteritis due to Yersinia enterocolitica: Secondary | ICD-10-CM | POA: Diagnosis present

## 2024-01-24 DIAGNOSIS — Z79899 Other long term (current) drug therapy: Secondary | ICD-10-CM | POA: Diagnosis not present

## 2024-01-24 DIAGNOSIS — I5032 Chronic diastolic (congestive) heart failure: Secondary | ICD-10-CM | POA: Diagnosis present

## 2024-01-24 DIAGNOSIS — R112 Nausea with vomiting, unspecified: Secondary | ICD-10-CM | POA: Diagnosis not present

## 2024-01-24 DIAGNOSIS — R197 Diarrhea, unspecified: Secondary | ICD-10-CM | POA: Diagnosis not present

## 2024-01-24 DIAGNOSIS — Z8673 Personal history of transient ischemic attack (TIA), and cerebral infarction without residual deficits: Secondary | ICD-10-CM | POA: Diagnosis not present

## 2024-01-24 DIAGNOSIS — R1084 Generalized abdominal pain: Secondary | ICD-10-CM | POA: Diagnosis present

## 2024-01-24 DIAGNOSIS — Z87891 Personal history of nicotine dependence: Secondary | ICD-10-CM | POA: Diagnosis not present

## 2024-01-24 DIAGNOSIS — Z7901 Long term (current) use of anticoagulants: Secondary | ICD-10-CM | POA: Diagnosis not present

## 2024-01-24 DIAGNOSIS — I110 Hypertensive heart disease with heart failure: Secondary | ICD-10-CM | POA: Diagnosis present

## 2024-01-24 DIAGNOSIS — I739 Peripheral vascular disease, unspecified: Secondary | ICD-10-CM | POA: Diagnosis present

## 2024-01-24 DIAGNOSIS — J4520 Mild intermittent asthma, uncomplicated: Secondary | ICD-10-CM | POA: Diagnosis present

## 2024-01-24 DIAGNOSIS — Z7951 Long term (current) use of inhaled steroids: Secondary | ICD-10-CM | POA: Diagnosis not present

## 2024-01-24 DIAGNOSIS — A0811 Acute gastroenteropathy due to Norwalk agent: Secondary | ICD-10-CM | POA: Diagnosis present

## 2024-01-24 DIAGNOSIS — K219 Gastro-esophageal reflux disease without esophagitis: Secondary | ICD-10-CM | POA: Diagnosis present

## 2024-01-24 DIAGNOSIS — T17908A Unspecified foreign body in respiratory tract, part unspecified causing other injury, initial encounter: Secondary | ICD-10-CM | POA: Diagnosis present

## 2024-01-24 LAB — CBC WITH DIFFERENTIAL/PLATELET
Abs Immature Granulocytes: 0 10*3/uL (ref 0.00–0.07)
Basophils Absolute: 0 10*3/uL (ref 0.0–0.1)
Basophils Relative: 0 %
Eosinophils Absolute: 0 10*3/uL (ref 0.0–0.5)
Eosinophils Relative: 0 %
HCT: 32.5 % — ABNORMAL LOW (ref 39.0–52.0)
Hemoglobin: 11.1 g/dL — ABNORMAL LOW (ref 13.0–17.0)
Lymphocytes Relative: 5 %
Lymphs Abs: 0.5 10*3/uL — ABNORMAL LOW (ref 0.7–4.0)
MCH: 33.2 pg (ref 26.0–34.0)
MCHC: 34.2 g/dL (ref 30.0–36.0)
MCV: 97.3 fL (ref 80.0–100.0)
Monocytes Absolute: 0.1 10*3/uL (ref 0.1–1.0)
Monocytes Relative: 1 %
Neutro Abs: 9.9 10*3/uL — ABNORMAL HIGH (ref 1.7–7.7)
Neutrophils Relative %: 94 %
Platelets: 126 10*3/uL — ABNORMAL LOW (ref 150–400)
RBC: 3.34 MIL/uL — ABNORMAL LOW (ref 4.22–5.81)
RDW: 14.2 % (ref 11.5–15.5)
WBC: 10.5 10*3/uL (ref 4.0–10.5)
nRBC: 0 % (ref 0.0–0.2)
nRBC: 0 /100{WBCs}

## 2024-01-24 LAB — IRON AND TIBC
Iron: 23 ug/dL — ABNORMAL LOW (ref 45–182)
Saturation Ratios: 10 % — ABNORMAL LOW (ref 17.9–39.5)
TIBC: 238 ug/dL — ABNORMAL LOW (ref 250–450)
UIBC: 215 ug/dL

## 2024-01-24 LAB — RETICULOCYTES
Immature Retic Fract: 9 % (ref 2.3–15.9)
RBC.: 3.34 MIL/uL — ABNORMAL LOW (ref 4.22–5.81)
Retic Count, Absolute: 36.7 10*3/uL (ref 19.0–186.0)
Retic Ct Pct: 1.1 % (ref 0.4–3.1)

## 2024-01-24 LAB — COMPREHENSIVE METABOLIC PANEL
ALT: 20 U/L (ref 0–44)
AST: 25 U/L (ref 15–41)
Albumin: 2.6 g/dL — ABNORMAL LOW (ref 3.5–5.0)
Alkaline Phosphatase: 61 U/L (ref 38–126)
Anion gap: 7 (ref 5–15)
BUN: 35 mg/dL — ABNORMAL HIGH (ref 8–23)
CO2: 20 mmol/L — ABNORMAL LOW (ref 22–32)
Calcium: 7.5 mg/dL — ABNORMAL LOW (ref 8.9–10.3)
Chloride: 105 mmol/L (ref 98–111)
Creatinine, Ser: 1.31 mg/dL — ABNORMAL HIGH (ref 0.61–1.24)
GFR, Estimated: 54 mL/min — ABNORMAL LOW (ref >60)
Glucose, Bld: 114 mg/dL — ABNORMAL HIGH (ref 70–99)
Potassium: 3.7 mmol/L (ref 3.5–5.1)
Sodium: 132 mmol/L — ABNORMAL LOW (ref 135–145)
Total Bilirubin: 0.8 mg/dL (ref 0.0–1.2)
Total Protein: 5 g/dL — ABNORMAL LOW (ref 6.5–8.1)

## 2024-01-24 LAB — C DIFFICILE QUICK SCREEN W PCR REFLEX
C Diff antigen: NEGATIVE
C Diff interpretation: NOT DETECTED
C Diff toxin: NEGATIVE

## 2024-01-24 LAB — FERRITIN: Ferritin: 101 ng/mL (ref 24–336)

## 2024-01-24 LAB — FOLATE: Folate: 28.3 ng/mL (ref >5.9)

## 2024-01-24 LAB — VITAMIN B12: Vitamin B-12: 242 pg/mL (ref 180–914)

## 2024-01-24 MED ORDER — PANTOPRAZOLE SODIUM 40 MG PO TBEC
40.0000 mg | DELAYED_RELEASE_TABLET | Freq: Every day | ORAL | Status: DC
  Administered 2024-01-24 – 2024-01-26 (×3): 40 mg via ORAL
  Filled 2024-01-24 (×3): qty 1

## 2024-01-24 MED ORDER — ACETAMINOPHEN 325 MG PO TABS: 650.0000 mg | ORAL_TABLET | Freq: Four times a day (QID) | ORAL | Status: DC | PRN

## 2024-01-24 MED ORDER — LACTATED RINGERS IV SOLN: INTRAVENOUS | Status: AC

## 2024-01-24 MED ORDER — ORAL CARE MOUTH RINSE: 15.0000 mL | OROMUCOSAL | Status: DC | PRN

## 2024-01-24 MED ORDER — MAGNESIUM OXIDE -MG SUPPLEMENT 400 (240 MG) MG PO TABS
400.0000 mg | ORAL_TABLET | Freq: Every morning | ORAL | Status: DC
Start: 2024-01-24 — End: 2024-01-26
  Administered 2024-01-24 – 2024-01-26 (×3): 400 mg via ORAL
  Filled 2024-01-24 (×3): qty 1

## 2024-01-24 MED ORDER — OMEGA-3-ACID ETHYL ESTERS 1 G PO CAPS
1.0000 g | ORAL_CAPSULE | Freq: Every day | ORAL | Status: DC
  Administered 2024-01-24 – 2024-01-26 (×3): 1 g via ORAL
  Filled 2024-01-24 (×3): qty 1

## 2024-01-24 MED ORDER — SODIUM CHLORIDE 0.9 % IV SOLN
3.0000 g | Freq: Four times a day (QID) | INTRAVENOUS | Status: DC
  Administered 2024-01-24 (×3): 3 g via INTRAVENOUS
  Filled 2024-01-24 (×3): qty 8

## 2024-01-24 MED ORDER — IPRATROPIUM-ALBUTEROL 0.5-2.5 (3) MG/3ML IN SOLN
3.0000 mL | RESPIRATORY_TRACT | Status: AC
  Administered 2024-01-24 – 2024-01-25 (×8): 3 mL via RESPIRATORY_TRACT
  Filled 2024-01-24 (×8): qty 3

## 2024-01-24 MED ORDER — ALBUTEROL SULFATE (2.5 MG/3ML) 0.083% IN NEBU
3.0000 mL | INHALATION_SOLUTION | Freq: Four times a day (QID) | RESPIRATORY_TRACT | Status: DC | PRN
  Filled 2024-01-24: qty 3

## 2024-01-24 MED ORDER — BUDESONIDE 0.25 MG/2ML IN SUSP
0.2500 mg | Freq: Two times a day (BID) | RESPIRATORY_TRACT | Status: DC
  Administered 2024-01-24 – 2024-01-26 (×5): 0.25 mg via RESPIRATORY_TRACT
  Filled 2024-01-24 (×5): qty 2

## 2024-01-24 MED ORDER — FERROUS SULFATE 325 (65 FE) MG PO TABS
325.0000 mg | ORAL_TABLET | Freq: Every day | ORAL | Status: DC
  Administered 2024-01-25 – 2024-01-26 (×2): 325 mg via ORAL
  Filled 2024-01-24 (×3): qty 1

## 2024-01-24 MED ORDER — APIXABAN 5 MG PO TABS
5.0000 mg | ORAL_TABLET | Freq: Two times a day (BID) | ORAL | Status: DC
  Administered 2024-01-24 – 2024-01-26 (×6): 5 mg via ORAL
  Filled 2024-01-24 (×7): qty 1

## 2024-01-24 MED ORDER — ATORVASTATIN CALCIUM 40 MG PO TABS
40.0000 mg | ORAL_TABLET | Freq: Every day | ORAL | Status: DC
  Administered 2024-01-24 – 2024-01-26 (×3): 40 mg via ORAL
  Filled 2024-01-24 (×3): qty 1

## 2024-01-24 NOTE — Progress Notes (Signed)
 Progress Note   Patient: [name] MVH:[id number] DOB: 14-Jan-1941 DOA: 01/23/2024     0 DOS: the patient was seen and examined on 01/24/2024   Brief hospital course: 83yo with h/o afib, TIA, pacemaker, and anemia who presented on 3/12 with intractable n/v/d.  Found to have AKI.  Assessment and Plan:  Intractable n/v/d Patient with acute onset of symptoms about 24 hours PTA Symptoms have resolved at this time CT A/P negative Now tolerating regular diet  Aspiration event CT A/P also showed concern for aspiration Patient with scant cough and no other symptoms He was given Unasyn but does not have obvious infection at this time so will hold additional antibiotics and continue to monitor  AKI Baseline creatinine 0.8, was 1.33 on admission Appears to have peaked, slightly improved Continue gentle overnight IVF hydration  HTN BP remains marginal at 99/54 Continue to hold amlodipine Continue IVF  Afib Not on rate controlling medications Has pacemaker Continue Eliquis  Anemia. normocytic Mild, stable Unremarkable iron panel other than low iron Continue FeSO4 supplementation  Thrombocytopenia Chronic, stable  Recurrent TIA S/p L CEA Continue Eliquis, statin  GERD Continue PPI  Asthma Continue Arnuity, Albuterol  Glaucoma Continue timolol     Consultants: None  Procedures: None  Antibiotics: Unasyn x 1 day    Subjective: Feeling better.  Tolerated breakfast (regular diet) without difficulty.  If no further n/v/d and able to ambulate, he wants to go home today.  Unfortunately, he remained profoundly weak and SOB with ambulation and so is willing to remain hospitalized.  Physical Exam: Vitals:   01/24/24 1300 01/24/24 1400 01/24/24 1415 01/24/24 1434  BP: 131/80 (!) 126/52  (!) 99/54  Pulse: (!) 103 75  66  Resp: 20 15  18   Temp:   98.8 F (37.1 C) 98.3 F (36.8 C)  TempSrc:   Oral Oral  SpO2: 95% 95%  94%  Weight:      Height:         Intake/Output Summary (Last 24 hours) at 01/24/2024 1531 Last data filed at 01/24/2024 0433 Gross per 24 hour  Intake 100 ml  Output 150 ml  Net -50 ml   Filed Weights   01/23/24 1500  Weight: 69.9 kg    Exam:  General:  Appears calm and comfortable and is in NAD Eyes:  PERRL, EOMI, normal lids, iris ENT:  hard of hearing, grossly normal lips & tongue, mmm Neck:  no LAD, masses or thyromegaly Cardiovascular:  RRR, no m/r/g. No LE edema.  Respiratory:   CTA bilaterally with no wheezes/rales/rhonchi.  Normal respiratory effort. Abdomen:  soft, NT, ND Skin:  no rash or induration seen on limited exam Musculoskeletal:  grossly normal tone BUE/BLE, good ROM, no bony abnormality Psychiatric:  pleasant mood and affect, speech fluent and appropriate, AOx3 Neurologic:  CN 2-12 grossly intact, moves all extremities in coordinated fashion  Data Reviewed: I have reviewed the patient's lab results since admission.  Pertinent labs for today include:  Na++ 132 CO2 20 Glucose 114 BUN 35/Creatinine 1.31/GFR 54, slightly improved from presentation; 25/0.89/>60 on 08/12/23 Albumin 2.6 Lactate 1.1 WBC 10.5 Hgb 11.1, 11.8 on 9/29     Family Communication: None present; I called his wife and left a message  Disposition: Status is: Inpatient Admit - It is my clinical opinion that admission to INPATIENT is reasonable and necessary because of the expectation that this patient will require hospital care that crosses at least 2 midnights to treat this condition based on  the medical complexity of the problems presented.  Given the aforementioned information, the predictability of an adverse outcome is felt to be significant.    Planned Discharge Destination: Home    Time spent: 50 minutes  Author: Jonah Blue, MD 01/24/2024 3:31 PM  For on call review [email or web address].

## 2024-01-24 NOTE — Plan of Care (Signed)

## 2024-01-24 NOTE — ED Notes (Signed)
 Family at bedside.

## 2024-01-24 NOTE — ED Notes (Signed)
 Assisted patient with ambulation with the use of a walker from bed to [facility], patient became lightheaded and SOB, assisted pt back to bed.

## 2024-01-24 NOTE — H&P (Addendum)
 History and Physical    [name] XBM:[id number] DOB: 1941/05/11 DOA: 01/23/2024  Patient coming from: Home.  Chief Complaint: Nausea vomiting and diarrhea.  HPI: [name] is a 83 y.o. male with history of A-fib, TIA, sick sinus syndrome status post pacemaker placement, GERD, chronic anemia was brought to the ER after patient has been having multiple episodes of nausea vomiting and diarrhea since yesterday morning.  Had some mild abdominal discomfort.  Denies any blood in the vomitus.  Denies any recent travel or sick contacts or having any antibiotics.  Since patient was having persistent symptoms EMS was called and patient was found to be hypotensive.  ED Course: In the ER patient is found to be mildly wheezing.  CT abdomen pelvis shows features concerning for possible aspiration pneumonia.  Blood pressure was in the low normal, improved with fluid bolus.  Labs showed leukocytosis of 14.3 hemoglobin 12.7.  Creatinine 1.3.  Patient admitted for acute renal failure with persistent nausea vomiting diarrhea with SIRS symptoms and possible aspiration.  Review of Systems: As per HPI, rest all negative.   Past Medical History:  Diagnosis Date   Carotid artery occlusion    GERD (gastroesophageal reflux disease)    Hypertension    Stroke (HCC) 02/2021   TIA    Past Surgical History:  Procedure Laterality Date   CATARACT EXTRACTION, BILATERAL     ENDARTERECTOMY Left 09/16/2020   Procedure: LEFT ENDARTERECTOMY CAROTID;  Surgeon: Maeola Harman, MD;  Location: [facility] OR;  Service: Vascular;  Laterality: Left;   HERNIA REPAIR     PACEMAKER IMPLANT     TONSILLECTOMY       reports that he has quit smoking. He has never been exposed to tobacco smoke. He has never used smokeless tobacco. He reports that he does not currently use alcohol. He reports that he does not use drugs.  No Known Allergies  Family History  Family history unknown: Yes    Prior to Admission medications    Medication Sig Start Date End Date Taking? Authorizing Provider  acetaminophen (TYLENOL) 325 MG tablet Take 2 tablets (650 mg total) by mouth every 6 (six) hours as needed. 03/04/22   Sloan Leiter, DO  albuterol (VENTOLIN HFA) 108 (90 Base) MCG/ACT inhaler Inhale 2 puffs into the lungs 4 (four) times daily as needed for wheezing or shortness of breath. 01/30/23   [place], Betty G, MD  amLODipine (NORVASC) 2.5 MG tablet TAKE 1 TABLET BY MOUTH EVERY DAY 07/20/23   [place], Betty G, MD  ARNUITY ELLIPTA 200 MCG/ACT AEPB INHALE 1 PUFF INTO THE LUNGS EVERY MORNING. 05/23/23   [place], Betty G, MD  atorvastatin (LIPITOR) 40 MG tablet Take 1 tablet (40 mg total) by mouth daily. 07/27/23   [place], Betty G, MD  cholecalciferol (VITAMIN D) 25 MCG (1000 UNIT) tablet Take 1,000 Units by mouth at bedtime.    [provider]  docusate sodium (COLACE) 50 MG capsule Take 50 mg by mouth 2 (two) times daily.    [provider]  ELIQUIS 5 MG TABS tablet TAKE 1 TABLET BY MOUTH TWICE A DAY 01/15/24   [place], Betty G, MD  Ferrous Sulfate (IRON) 325 (65 Fe) MG TABS Take 1 tablet by mouth at bedtime.    [provider]  Magnesium 250 MG TABS Take 250 mg by mouth every morning.    [provider]  Multiple Vitamin (MULTIVITAMIN WITH MINERALS) TABS tablet Take 1 tablet by mouth every morning.  [provider]  Omega-3 Fatty Acids (FISH OIL PO) Take 1 capsule by mouth every morning.    [provider]  omeprazole (PRILOSEC) 40 MG capsule TAKE 1 CAPSULE BY MOUTH DAILY BEFORE SUPPER. Patient taking differently: Take 40 mg by mouth daily. 01/15/24   [place], Betty G, MD  TIMOLOL MALEATE OP Apply 1 drop to eye in the morning. 1 drop into both eyes every morning    [provider]    Physical Exam: Constitutional: Moderately built and nourished. Vitals:   01/23/24 2300 01/23/24 2315 01/23/24 2330 01/23/24 2345  BP: (!) 121/48 (!) 111/49 (!) 111/47 104/64  Pulse: 62 61 (!)  59 67  Resp: 18 12 (!) 30 17  Temp:      TempSrc:      SpO2: 94% 95%  93%  Weight:      Height:       Eyes: Anicteric no pallor. ENMT: No discharge from the ears eyes nose or mouth. Neck: No mass felt.  No neck rigidity. Respiratory: No rhonchi or crepitations. Cardiovascular: S1-S2 heard. Abdomen: Soft nontender bowel sound present. Musculoskeletal: No edema. Skin: No rash. Neurologic: Alert awake oriented to time place and person.  Moves all extremities. Psychiatric: Appears normal.  Normal affect.   Labs on Admission: I have personally reviewed following labs and imaging studies  CBC: Recent Labs  Lab 01/23/24 1456  WBC 14.3*  NEUTROABS 12.9*  HGB 12.7*  HCT 36.7*  MCV 96.8  PLT 137*   Basic Metabolic Panel: Recent Labs  Lab 01/23/24 1456  NA 135  K 3.8  CL 104  CO2 22  GLUCOSE 100*  BUN 35*  CREATININE 1.33*  CALCIUM 7.8*  MG 1.7   GFR: Estimated Creatinine Clearance: 38 mL/min (A) (by C-G formula based on SCr of 1.33 mg/dL (H)). Liver Function Tests: Recent Labs  Lab 01/23/24 1456  AST 28  ALT 22  ALKPHOS 63  BILITOT 1.0  PROT 5.4*  ALBUMIN 2.9*   Recent Labs  Lab 01/23/24 1456  LIPASE 22   No results for input(s): "AMMONIA" in the last 168 hours. Coagulation Profile: No results for input(s): "INR", "PROTIME" in the last 168 hours. Cardiac Enzymes: No results for input(s): "CKTOTAL", "CKMB", "CKMBINDEX", "TROPONINI" in the last 168 hours. BNP (last 3 results) No results for input(s): "PROBNP" in the last 8760 hours. HbA1C: No results for input(s): "HGBA1C" in the last 72 hours. CBG: No results for input(s): "GLUCAP" in the last 168 hours. Lipid Profile: No results for input(s): "CHOL", "HDL", "LDLCALC", "TRIG", "CHOLHDL", "LDLDIRECT" in the last 72 hours. Thyroid Function Tests: No results for input(s): "TSH", "T4TOTAL", "FREET4", "T3FREE", "THYROIDAB" in the last 72 hours. Anemia Panel: No results for input(s): "VITAMINB12",  "FOLATE", "FERRITIN", "TIBC", "IRON", "RETICCTPCT" in the last 72 hours. Urine analysis:    Component Value Date/Time   COLORURINE YELLOW 05/03/2022 2015   APPEARANCEUR CLEAR 05/03/2022 2015   LABSPEC 1.009 05/03/2022 2015   PHURINE 6.0 05/03/2022 2015   GLUCOSEU NEGATIVE 05/03/2022 2015   HGBUR NEGATIVE 05/03/2022 2015   BILIRUBINUR NEGATIVE 05/03/2022 2015   KETONESUR NEGATIVE 05/03/2022 2015   PROTEINUR NEGATIVE 05/03/2022 2015   NITRITE NEGATIVE 05/03/2022 2015   LEUKOCYTESUR NEGATIVE 05/03/2022 2015   Sepsis Labs: @LABRCNTIP (procalcitonin:4,lacticidven:4) ) Recent Results (from the past 240 hours)  Resp panel by RT-PCR (RSV, Flu A&B, Covid) Anterior Nasal Swab     Status: None   Collection Time: 01/23/24  4:11 PM   Specimen: Anterior Nasal Swab  Result  Value Ref Range Status   SARS Coronavirus 2 by RT PCR NEGATIVE NEGATIVE Final   Influenza A by PCR NEGATIVE NEGATIVE Final   Influenza B by PCR NEGATIVE NEGATIVE Final    Comment: (NOTE) The Xpert Xpress SARS-CoV-2/FLU/RSV plus assay is intended as an aid in the diagnosis of influenza from Nasopharyngeal swab specimens and should not be used as a sole basis for treatment. Nasal washings and aspirates are unacceptable for Xpert Xpress SARS-CoV-2/FLU/RSV testing.  Fact Sheet for Patients: BloggerCourse.com  Fact Sheet for Healthcare Providers: SeriousBroker.it  This test is not yet approved or cleared by the [place] FDA and has been authorized for detection and/or diagnosis of SARS-CoV-2 by FDA under an Emergency Use Authorization (EUA). This EUA will remain in effect (meaning this test can be used) for the duration of the COVID-19 declaration under Section 564(b)(1) of the Act, 21 U.S.C. section 360bbb-3(b)(1), unless the authorization is terminated or revoked.     Resp Syncytial Virus by PCR NEGATIVE NEGATIVE Final    Comment: (NOTE) Fact Sheet for  Patients: BloggerCourse.com  Fact Sheet for Healthcare Providers: SeriousBroker.it  This test is not yet approved or cleared by the [place] FDA and has been authorized for detection and/or diagnosis of SARS-CoV-2 by FDA under an Emergency Use Authorization (EUA). This EUA will remain in effect (meaning this test can be used) for the duration of the COVID-19 declaration under Section 564(b)(1) of the Act, 21 U.S.C. section 360bbb-3(b)(1), unless the authorization is terminated or revoked.  Performed at [facility], [address], [place], [place] [address]      Radiological Exams on Admission: CT ABDOMEN PELVIS W CONTRAST Result Date: 01/23/2024 CLINICAL DATA:  Acute nonlocalized abdominal pain EXAM: CT ABDOMEN AND PELVIS WITH CONTRAST TECHNIQUE: Multidetector CT imaging of the abdomen and pelvis was performed using the standard protocol following bolus administration of intravenous contrast. RADIATION DOSE REDUCTION: This exam was performed according to the departmental dose-optimization program which includes automated exposure control, adjustment of the mA and/or kV according to patient size and/or use of iterative reconstruction technique. CONTRAST:  75mL OMNIPAQUE IOHEXOL 350 MG/ML SOLN COMPARISON:  None Available. FINDINGS: Lower chest: There is bronchial wall thickening in keeping with airway inflammation and peribronchial infiltrate within the right middle and lower lobes which may relate to changes of multifocal infection or aspiration. Large hiatal hernia. Pacemaker leads are seen within the right heart. Extensive right coronary artery calcification. Hepatobiliary: Scattered hypodensities are seen throughout the liver which are too small accurately characterize likely represent cysts in a patient without history of malignancy. The liver is otherwise unremarkable. Gallbladder. No intra or extrahepatic biliary ductal dilation.  Pancreas: Unremarkable Spleen: Unremarkable Adrenals/Urinary Tract: Adrenal glands are unremarkable. Kidneys are normal, without renal calculi, focal lesion, or hydronephrosis. Bladder is unremarkable. Stomach/Bowel: Stomach is largely thoracic in position. Small large bowel are unremarkable. Appendix normal. No free intraperitoneal gas or fluid. Vascular/Lymphatic: Aortic atherosclerosis. No enlarged abdominal or pelvic lymph nodes. Reproductive: Prostate is unremarkable. Other: Tiny fat containing bilateral hernias. Musculoskeletal: Osseous structures are diffusely osteopenic. 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 No acute bone abnormality IMPRESSION: 1. No acute intra-abdominal pathology identified. 2. Bronchial wall thickening in keeping with airway inflammation and peribronchial infiltrate within the right middle and lower lobes which may relate to changes of multifocal infection or aspiration. 3. Large hiatal hernia. 4. Extensive right coronary artery calcification. Aortic Atherosclerosis (ICD10-I70.0). Electronically Signed   By: Gloris Ham  Ramiro Harvest M.D.   On: 01/23/2024 21:21   DG Chest 2 View Result Date: 01/23/2024 CLINICAL DATA:  Cough. EXAM: CHEST - 2 VIEW COMPARISON:  August 12, 2023. FINDINGS: The heart size and mediastinal contours are within normal limits. Left-sided pacemaker is unchanged. Minimal bibasilar subsegmental atelectasis is noted. Moderate size hiatal hernia is noted. The visualized skeletal structures are unremarkable. IMPRESSION: Hiatal hernia.  Minimal bibasilar subsegmental atelectasis. Electronically Signed   By: Lupita Raider M.D.   On: 01/23/2024 18:45    EKG: Independently reviewed.  Normal sinus rhythm.  Assessment/Plan Principal Problem:   Nausea vomiting and diarrhea Active Problems:   TIA (transient ischemic attack)   PAF (paroxysmal atrial fibrillation) (HCC)   AKI (acute kidney injury) (HCC)    (HFpEF) heart failure with preserved ejection fraction (HCC)   PAD (peripheral artery disease) (HCC)   Asthma in adult, mild intermittent, uncomplicated   Thrombocytopenia (HCC)   SIRS (systemic inflammatory response syndrome) (HCC)    SIRS secondary to nausea vomiting and diarrhea likely gastroenteritis.  CT abdomen pelvis does not show anything acute in the abdomen.  Will check stool studies if patient has further diarrhea.  Continue with gentle hydration and advance diet as tolerated. Possible aspiration pneumonia on empiric antibiotics.  I have also placed patient on scheduled nebulizer for now. Acute renal failure with creatinine increasing from 0.8 in November 2024 it is around 1.3 with mild hypercalcemia.  Continue hydration and repeat metabolic panel and if hypercalcemia persist further workup on this. Hypertension hold amlodipine in the setting of low normal blood pressure at presentation.  Follow blood pressure trends. History of atrial fibrillation presently in sinus rhythm on Eliquis.  Not on any rate limiting medications. History of sick sinus syndrome status post pacemaker placement. Anemia appears to be chronic.  Follow CBC.  Check anemia panel. Thrombocytopenia has had prior history of thrombocytopenia.  Will closely monitor. History of recurrent TIA status post left-sided carotid endarterectomy on Eliquis and statins. GERD on PPI. Coronary artery calcification seen on the CAT scan and further workup as outpatient.  Since patient has intractable nausea vomiting diarrhea with initial presentation with hypotension with acute renal failure and possible aspiration will need close monitoring and more than 2 midnight stay.  DVT prophylaxis: Eliquis. Code Status: Full code. Family Communication: Unable to reach patient's wife. Disposition Plan: Monitored bed. Consults called: None. Admission status: Observation.

## 2024-01-24 NOTE — Progress Notes (Signed)
 Pharmacy Antibiotic Note  [name] is a 83 y.o. male admitted on 01/23/2024 with concern for aspiration pneumonia.  Pharmacy has been consulted for Unasyn dosing.  Plan: Unasyn 3g IV Q6H.  Height: 5\' 6"  (167.6 cm) Weight: 69.9 kg (154 lb) IBW/kg (Calculated) : 63.8  Temp (24hrs), Avg:98.5 F (36.9 C), Min:98.5 F (36.9 C), Max:98.5 F (36.9 C)  Recent Labs  Lab 01/23/24 1456 01/23/24 2340  WBC 14.3*  --   CREATININE 1.33*  --   LATICACIDVEN  --  1.1    Estimated Creatinine Clearance: 38 mL/min (A) (by C-G formula based on SCr of 1.33 mg/dL (H)).    No Known Allergies   Thank you for allowing pharmacy to be a part of this patient's care.  Vernard Gambles, PharmD, BCPS  01/24/2024 2:21 AM

## 2024-01-25 DIAGNOSIS — R112 Nausea with vomiting, unspecified: Secondary | ICD-10-CM | POA: Diagnosis not present

## 2024-01-25 DIAGNOSIS — R197 Diarrhea, unspecified: Secondary | ICD-10-CM | POA: Diagnosis not present

## 2024-01-25 LAB — GASTROINTESTINAL PANEL BY PCR, STOOL (REPLACES STOOL CULTURE)
Adenovirus F40/41: NOT DETECTED
Astrovirus: NOT DETECTED
Campylobacter species: NOT DETECTED
Cryptosporidium: NOT DETECTED
Cyclospora cayetanensis: NOT DETECTED
Entamoeba histolytica: NOT DETECTED
Enteroaggregative E coli (EAEC): NOT DETECTED
Enteropathogenic E coli (EPEC): NOT DETECTED
Enterotoxigenic E coli (ETEC): NOT DETECTED
Giardia lamblia: NOT DETECTED
Norovirus GI/GII: DETECTED — AB
Plesimonas shigelloides: NOT DETECTED
Rotavirus A: NOT DETECTED
Salmonella species: NOT DETECTED
Sapovirus (I, II, IV, and V): NOT DETECTED
Shiga like toxin producing E coli (STEC): NOT DETECTED
Shigella/Enteroinvasive E coli (EIEC): NOT DETECTED
Vibrio cholerae: NOT DETECTED
Vibrio species: NOT DETECTED
Yersinia enterocolitica: DETECTED — AB

## 2024-01-25 LAB — BASIC METABOLIC PANEL
Anion gap: 7 (ref 5–15)
BUN: 24 mg/dL — ABNORMAL HIGH (ref 8–23)
CO2: 21 mmol/L — ABNORMAL LOW (ref 22–32)
Calcium: 7.8 mg/dL — ABNORMAL LOW (ref 8.9–10.3)
Chloride: 107 mmol/L (ref 98–111)
Creatinine, Ser: 1.26 mg/dL — ABNORMAL HIGH (ref 0.61–1.24)
GFR, Estimated: 57 mL/min — ABNORMAL LOW (ref >60)
Glucose, Bld: 112 mg/dL — ABNORMAL HIGH (ref 70–99)
Potassium: 3.6 mmol/L (ref 3.5–5.1)
Sodium: 135 mmol/L (ref 135–145)

## 2024-01-25 LAB — MAGNESIUM: Magnesium: 1.9 mg/dL (ref 1.7–2.4)

## 2024-01-25 MED ORDER — AMLODIPINE BESYLATE 5 MG PO TABS
2.5000 mg | ORAL_TABLET | Freq: Every day | ORAL | Status: DC
  Administered 2024-01-25 – 2024-01-26 (×2): 2.5 mg via ORAL
  Filled 2024-01-25 (×2): qty 1

## 2024-01-25 MED ORDER — SODIUM CHLORIDE 0.9 % IV SOLN
INTRAVENOUS | Status: AC
Start: 2024-01-25 — End: 2024-01-25

## 2024-01-25 MED ORDER — TIMOLOL MALEATE 0.5 % OP SOLN
1.0000 [drp] | Freq: Every day | OPHTHALMIC | Status: DC
  Administered 2024-01-25 – 2024-01-26 (×2): 1 [drp] via OPHTHALMIC
  Filled 2024-01-25: qty 5

## 2024-01-25 NOTE — Evaluation (Signed)
 Occupational Therapy Evaluation Patient Details Name: [name] MRN: [id number] DOB: 13-Feb-1941 Today's Date: 01/25/2024   History of Present Illness   83 yo who presented on 3/12 with intractable n/v/d. Found to have AKI. PMHx: h/o afib, TIA, pacemaker, and anemia.     Clinical Impressions PTA patient independent with ADLs, mobility using rollator and facility assists with IADLs. Admitted for above and presents with problem list below.  Patient requires min assist for transfers and in room mobility using RW, setup to min assist for ADLs.  Pt fatigues easily and has decreased activity tolerance.  He reports needing to be very independent, as his spouse cannot assist him.  VSS on RA. Based on performance today, believe patient will best benefit from continued OT services acutely and after dc at inpatient setting with <3hrs/day to optimize independence, safety with ADLs and mobility.      If plan is discharge home, recommend the following:   A little help with walking and/or transfers;A little help with bathing/dressing/bathroom     Functional Status Assessment   Patient has had a recent decline in their functional status and demonstrates the ability to make significant improvements in function in a reasonable and predictable amount of time.     Equipment Recommendations   None recommended by OT     Recommendations for Other Services         Precautions/Restrictions   Precautions Precautions: Fall Restrictions Weight Bearing Restrictions Per Provider Order: No     Mobility Bed Mobility               General bed mobility comments: OOB in recliner    Transfers Overall transfer level: Needs assistance Equipment used: Rolling walker (2 wheels) Transfers: Sit to/from Stand Sit to Stand: Min assist           General transfer comment: min assist to power up from recliner, improved to min guard increased cueing to maintain forward lean.  cues for hand  placement and safety      Balance Overall balance assessment: Needs assistance Sitting-balance support: No upper extremity supported, Feet supported Sitting balance-Leahy Scale: Fair     Standing balance support: Bilateral upper extremity supported, During functional activity, No upper extremity supported Standing balance-Leahy Scale: Poor Standing balance comment: able to stand without UE supportbriefly for ADL simulation, relies on BUE support dynamically                           ADL either performed or assessed with clinical judgement   ADL Overall ADL's : Needs assistance/impaired     Grooming: Set up;Sitting           Upper Body Dressing : Set up;Sitting   Lower Body Dressing: Minimal assistance;Sit to/from stand Lower Body Dressing Details (indicate cue type and reason): a little help to don socks, min assist to stand Toilet Transfer: Minimal assistance;Ambulation;Rolling walker (2 wheels)           Functional mobility during ADLs: Minimal assistance;Rolling walker (2 wheels)       Vision   Vision Assessment?: No apparent visual deficits     Perception         Praxis         Pertinent Vitals/Pain Pain Assessment Pain Assessment: No/denies pain     Extremity/Trunk Assessment Upper Extremity Assessment Upper Extremity Assessment: Generalized weakness   Lower Extremity Assessment Lower Extremity Assessment: Defer to PT evaluation  Communication Communication Communication: Impaired Factors Affecting Communication: Hearing impaired   Cognition Arousal: Alert Behavior During Therapy: WFL for tasks assessed/performed Cognition: No apparent impairments                               Following commands: Intact       Cueing  General Comments   Cueing Techniques: Verbal cues  VSS on RA   Exercises     Shoulder Instructions      Home Living Family/patient expects to be discharged to:: Private  residence Living Arrangements: Spouse/significant other Available Help at Discharge: Family;Available 24 hours/day Type of Home: Independent living facility Home Access: Level entry     Home Layout: One level     Bathroom Shower/Tub: Producer, television/film/video: Handicapped height Bathroom Accessibility: Yes   Home Equipment: Agricultural consultant (2 wheels);Rollator (4 wheels);Shower seat;Grab bars - toilet;Grab bars - tub/shower;Hand held shower head          Prior Functioning/Environment Prior Level of Function : Independent/Modified Independent             Mobility Comments: uses rollator. Goes to fitness classes and PT ADLs Comments: States he is ind with ADLs; Living facility provides meals    OT Problem List: Decreased strength;Decreased activity tolerance;Impaired balance (sitting and/or standing);Decreased knowledge of precautions;Decreased knowledge of use of DME or AE   OT Treatment/Interventions: Therapeutic exercise;Self-care/ADL training;DME and/or AE instruction;Therapeutic activities;Patient/family education;Balance training;Energy conservation      OT Goals(Current goals can be found in the care plan section)   Acute Rehab OT Goals Patient Stated Goal: home but get stronger OT Goal Formulation: With patient Time For Goal Achievement: 02/08/24 Potential to Achieve Goals: Good   OT Frequency:  Min 1X/week    Co-evaluation              AM-PAC OT "6 Clicks" Daily Activity     Outcome Measure Help from another person eating meals?: None Help from another person taking care of personal grooming?: A Little Help from another person toileting, which includes using toliet, bedpan, or urinal?: A Little Help from another person bathing (including washing, rinsing, drying)?: A Lot Help from another person to put on and taking off regular upper body clothing?: A Little Help from another person to put on and taking off regular lower body clothing?: A  Little 6 Click Score: 18   End of Session Equipment Utilized During Treatment: Gait belt;Rolling walker (2 wheels) Nurse Communication: Mobility status  Activity Tolerance: Patient tolerated treatment well Patient left: in chair;with call bell/phone within reach;with chair alarm set  OT Visit Diagnosis: Other abnormalities of gait and mobility (R26.89);Muscle weakness (generalized) (M62.81)                Time: [id number] OT Time Calculation (min): 20 min Charges:  OT General Charges $OT Visit: 1 Visit OT Evaluation $OT Eval Moderate Complexity: 1 Mod  Barry Brunner, OT Acute Rehabilitation Services Office [phone] Secure Chat Preferred    Chancy Milroy 01/25/2024, 1:20 PM

## 2024-01-25 NOTE — Plan of Care (Signed)

## 2024-01-25 NOTE — TOC Initial Note (Addendum)
 Transition of Care [facility]) - Initial/Assessment Note    Patient Details  Name: [name] MRN: [id number] Date of Birth: 20-Jun-1941  Transition of Care [facility]) CM/SW Contact:    Mearl Latin, LCSW Phone Number: 01/25/2024, 12:30 PM  Clinical Narrative:                 12:30p-CSW received consult for possible SNF placement at time of discharge. Patient does not currently meet regular SNF Medicare guidelines so CSW searching for [facility] SNF Waiver bed. CSW received confirmation from [facility] that patient is eligible.  [facility] contracted options:  -[facility]: reviewing -[facility]: pending -[facility]: pending -[facility]: No beds available -[facility]: No beds available -[facility]: Not available today -[place]: No longer on [facility] list due to drop in star ratings.   3:24 PM-Still awaiting bed acceptance for the [facility] waiver.   CSW and RNCM, Shawn, met with patient to discuss plan. Patient reported understanding and will agree to whichever facility can accept him. His Rollator from home is at bedside. He stated daughter can pick it up if he needs ambulance versus being able to ride in the car.   4:58 PM-[facility] unable to accept patient until 5 days out due to Norovirus. MD updated.  CSW contacted patient's daughter Selena Batten [phone]) and left secure voicemail with patient's permission.    Skilled Nursing Rehab Facilities-   [email or web address]   Ratings out of 5 stars (5 the highest)   Name Address  Phone # Quality Care Staffing Health Inspection Overall  [facility] [address] [phone] 3 3 4 4   [facility] [address], [place] [phone] 5 1 4 4   [facility] [address], [place] Otto [phone] 2 2 2 2   [facility] [address] [place] [phone] 5 2 4 5   [facility]  [address], [place] [phone] 4 3 5 5   [facility] [address], [facility]  [phone] 4 2 2 2   [facility] [address], [place] [phone] 5 1 2 2   [facility] [phone] [address] [place] [phone] 2 1 3 2   [address] ([facility]) [address] [place] [phone] 2 3 3 3   [facility] [address] [place], [place] [phone] 3 3 2 2   [facility] ([place]) [address], [place] [phone] 3 1 1 1   [facility]igha Bridegroom [address] [place] [phone] 2 3 4 4   [facility] [address] [place] [phone] 4 4 3 3   [address] ([facility]) [address], [place] [phone] 1 2 4 3           [facility] [address], [place] [phone] 2 1 4 3   [facility] [address], [place] [phone] 4 2 1 1   [facility]  [address] [place], [place] [address] [phone] 2 2 2 2   [facility]  [address] [phone] 3 2 4 4   [facility] [address], [place] [phone] 2 1 2 1   [facility] (No UHC) [address], [place] [phone] 5 4 5 5   [facility] [address], [facility] [phone] 3 4 2 2   [facility] [address], [place] [phone] 2 1 1 1   [place] [address] [place] [phone] 4 2 5 5   [facility] [address], [place] [phone] 4 1 1 1   [facility] [address],  [place] [phone] 2 2 3 3           [facility]  [phone] 3 1 1 1   [facility] [address], [place]lyn Clines  [phone] 3 3 3 3   [facility] (No Humana) [address] [place], [place] [phone] 2 2 4 4   [facility]) [address], [place]an [phone] 2 1 1 1   [facility] [address] [place] [phone] 4 3 5 5   [facility] [address], [place] [phone] 1 1 1 1           [facility] [address] [place], [place] [phone] 5 4 5 5   [facility])  [address], [place] [phone] 1 1 2 1   [facility]) [address] [place] [phone]  2 4 4   [facility] [place] [address] [place] [phone] 3 5 5 5   [address] [place], [place] [phone] 4 2 2 2   [facility]) [address] [place] [phone] 2 1 3 2      Expected Discharge Plan: Skilled Nursing Facility Barriers to Discharge: SNF Pending bed offer, Insurance Authorization   Patient Goals and CMS Choice Patient states their goals for this hospitalization and ongoing recovery are:: Rehab CMS Medicare.gov Compare Post Acute Care list provided to:: Patient Choice offered to / list presented to : Patient [place] ownership interest in [facility].provided to:: Patient    Expected Discharge Plan and Services In-house Referral: Clinical Social Work   Post Acute Care Choice: Skilled Nursing Facility Living arrangements for the past 2 months: Single Family Home                                      Prior Living Arrangements/Services Living arrangements for the past 2 months: Single Family Home Lives with:: Spouse Patient language and need for interpreter reviewed:: Yes Do you feel safe going back to the place where you live?: Yes      Need for Family Participation in Patient Care: Yes (Comment) Care giver support system in place?: No (comment)   Criminal Activity/Legal Involvement Pertinent to Current Situation/Hospitalization: No - Comment as needed  Activities of Daily Living   ADL Screening (condition at time of admission) Independently performs ADLs?: Yes (appropriate for developmental age) Is the patient deaf or have difficulty hearing?: Yes Does the patient have difficulty seeing, even when wearing glasses/contacts?: No Does the patient have difficulty concentrating, remembering, or making decisions?: No  Permission Sought/Granted Permission sought to  share information with : Facility Medical sales representative, Family Supports Permission granted to share information with : Yes, Verbal Permission Granted     Permission granted to share info w AGENCY: SNFs        Emotional Assessment Appearance:: Appears stated age Attitude/Demeanor/Rapport: Engaged Affect (typically observed): Accepting Orientation: : Oriented to Self, Oriented to Place, Oriented to  Time, Oriented to Situation Alcohol / Substance Use: Not Applicable Psych Involvement: No (comment)  Admission diagnosis:  Generalized abdominal pain [R10.84] SIRS (systemic inflammatory response syndrome) (HCC) [R65.10] Nausea vomiting and diarrhea [R11.2, R19.7] Intractable nausea and vomiting [R11.2] Patient Active Problem List   Diagnosis Date Noted   Nausea vomiting and diarrhea 01/24/2024   Thrombocytopenia (HCC) 01/24/2024   SIRS (systemic inflammatory response syndrome) (HCC) 01/24/2024   Intractable nausea and vomiting 01/24/2024   Aspiration into airway 01/24/2024  Hypercoagulable state (HCC) 08/17/2023   Unstable gait 05/14/2023   Peripheral neuropathy 05/14/2023   Bilateral lower extremity edema 05/14/2023   GERD (gastroesophageal reflux disease) 02/06/2023   Chronic lower back pain 02/06/2023   Prediabetes 02/06/2023   Asthma in adult, mild intermittent, uncomplicated 09/21/2021   (HFpEF) heart failure with preserved ejection fraction (HCC) 04/06/2021   Atherosclerosis of aorta (HCC) 04/05/2021   PAD (peripheral artery disease) (HCC) 04/05/2021   AKI (acute kidney injury) (HCC) 03/12/2021   PAF (paroxysmal atrial fibrillation) (HCC) 12/24/2020   Presence of permanent cardiac pacemaker 09/12/2020   Essential hypertension 09/12/2020   Anemia 09/12/2020   Expressive aphasia 09/12/2020   TIA (transient ischemic attack) 09/12/2020   PCP:  [place], Betty G, MD Pharmacy:   [phone] [place] - [address] AT CORNER OF [facility] [address] [place] [address] Phone: [phone] Fax: [phone]  [facility] - [place] - [address] [place] [address] Phone: [phone] Fax: [phone]     Social Drivers of Health (SDOH) Social History: SDOH Screenings   Food Insecurity: No Food Insecurity (01/24/2024)  Housing: Low Risk  (01/24/2024)  Transportation Needs: No Transportation Needs (01/24/2024)  Utilities: Not At Risk (01/24/2024)  Alcohol Screen: Low Risk  (05/11/2023)  Depression (PHQ2-9): Low Risk  (05/11/2023)  Financial Resource Strain: Low Risk  (05/11/2023)  Physical Activity: Sufficiently Active (05/11/2023)  Recent Concern: Physical Activity - Insufficiently Active (05/11/2023)  Social Connections: Socially Integrated (01/24/2024)  Stress: No Stress Concern Present (05/11/2023)  Tobacco Use: Medium Risk (01/24/2024)   SDOH Interventions:     Readmission Risk Interventions     No data to display

## 2024-01-25 NOTE — Plan of Care (Signed)
  Problem: Education: Goal: Knowledge of General Education information will improve Description: Including pain rating scale, medication(s)/side effects and non-pharmacologic comfort measures Outcome: Progressing   Problem: Coping: Goal: Level of anxiety will decrease Outcome: Progressing   Problem: Pain Managment: Goal: General experience of comfort will improve and/or be controlled Outcome: Progressing

## 2024-01-25 NOTE — Evaluation (Signed)
 Physical Therapy Evaluation Patient Details Name: [name] MRN: [id number] DOB: 1941/05/09 Today's Date: 01/25/2024  History of Present Illness  83 yo who presented on 3/12 with intractable n/v/d. Found to have AKI. PMHx: h/o afib, TIA, pacemaker, and anemia.  Clinical Impression  Pt admitted with above diagnosis. He reports being independent with a rollator and active with physical therapy and fitness classes at his independent living facility [facility]. He currently requires up to min assist with all mobility assessed today. Requires help with transfers, and balance with gait. Denies dizziness with orthostatics test (see results below.) He does feel very weak compared to baseline and does not think wife can adequately assist with present needs. He is open to short term rehab to regain independence as appropriate. Patient will benefit from continued inpatient follow up therapy, <3 hours/day. Pt currently with functional limitations due to the deficits listed below (see PT Problem List). Pt will benefit from acute skilled PT to increase their independence and safety with mobility to allow discharge.       Asymptomatic:   01/25/24 0900  Orthostatic Lying   BP- Lying 142/70  Pulse- Lying 100  Orthostatic Sitting  BP- Sitting 150/77  Pulse- Sitting 108  Orthostatic Standing at 0 minutes  BP- Standing at 0 minutes 134/64  Pulse- Standing at 0 minutes 112  Orthostatic Standing at 3 minutes  BP- Standing at 3 minutes 132/51  Pulse- Standing at 3 minutes 103       If plan is discharge home, recommend the following: A little help with walking and/or transfers;A little help with bathing/dressing/bathroom;Assistance with cooking/housework;Assist for transportation   Can travel by private vehicle   Yes    Equipment Recommendations None recommended by PT  Recommendations for Other Services       Functional Status Assessment Patient has had a recent decline in their functional status and  demonstrates the ability to make significant improvements in function in a reasonable and predictable amount of time.     Precautions / Restrictions Precautions Precautions: Fall Restrictions Weight Bearing Restrictions Per Provider Order: No      Mobility  Bed Mobility Overal bed mobility: Needs Assistance Bed Mobility: Supine to Sit     Supine to sit: Min assist, HOB elevated     General bed mobility comments: Min assist to rise and scoot to EOB. Cues for technique. HOB elevated to assist.    Transfers Overall transfer level: Needs assistance Equipment used: Rolling walker (2 wheels) Transfers: Sit to/from Stand Sit to Stand: Min assist           General transfer comment: Min assist for boost to stand from bed, cues for hand placement, RW for support upon standing. Poor control with descent into recliner.    Ambulation/Gait Ambulation/Gait assistance: Min assist Gait Distance (Feet): 15 Feet Assistive device: Rolling walker (2 wheels) Gait Pattern/deviations: Step-through pattern, Decreased stride length, Shuffle, Trunk flexed Gait velocity: dec Gait velocity interpretation: <1.31 ft/sec, indicative of household ambulator   General Gait Details: Initially required min assist for balance leaning posteriorly, but progressed to CGA with heavy reliance on RW for support. No overt buckling. Very slow and guarded with turns, shuffling, cues for step length and foot clearance as tolerated. SpO2 90-93% on RA.  Stairs            Wheelchair Mobility     Tilt Bed    Modified Rankin (Stroke Patients Only)       Balance Overall balance assessment: Needs assistance  Sitting-balance support: No upper extremity supported, Feet supported Sitting balance-Leahy Scale: Fair   Postural control: Posterior lean Standing balance support: Bilateral upper extremity supported Standing balance-Leahy Scale: Poor                               Pertinent  Vitals/Pain Pain Assessment Pain Assessment: No/denies pain    Home Living Family/patient expects to be discharged to:: Private residence Living Arrangements: Spouse/significant other Available Help at Discharge: Family;Available 24 hours/day Type of Home: Independent living facility Home Access: Level entry       Home Layout: One level Home Equipment: Agricultural consultant (2 wheels);Rollator (4 wheels)      Prior Function Prior Level of Function : Independent/Modified Independent             Mobility Comments: uses rollator. Goes to fitness classes and PT ADLs Comments: States he is ind with ADLs; Living facility provides meals     Extremity/Trunk Assessment   Upper Extremity Assessment Upper Extremity Assessment: Defer to OT evaluation    Lower Extremity Assessment Lower Extremity Assessment: Generalized weakness       Communication   Communication Communication: Impaired Factors Affecting Communication: Hearing impaired    Cognition Arousal: Alert Behavior During Therapy: WFL for tasks assessed/performed   PT - Cognitive impairments: No apparent impairments                         Following commands: Intact       Cueing Cueing Techniques: Verbal cues     General Comments General comments (skin integrity, edema, etc.): SpO2 94% RA at rest, HR 71. See orthostatics    Exercises     Assessment/Plan    PT Assessment Patient needs continued PT services  PT Problem List Decreased strength;Decreased range of motion;Decreased activity tolerance;Decreased balance;Decreased mobility;Cardiopulmonary status limiting activity;Decreased knowledge of precautions       PT Treatment Interventions DME instruction;Gait training;Functional mobility training;Therapeutic activities;Therapeutic exercise;Balance training;Neuromuscular re-education;Patient/family education    PT Goals (Current goals can be found in the Care Plan section)  Acute Rehab PT  Goals Patient Stated Goal: Get stronger before going home PT Goal Formulation: With patient Time For Goal Achievement: 02/08/24 Potential to Achieve Goals: Good    Frequency Min 2X/week     Co-evaluation               AM-PAC PT "6 Clicks" Mobility  Outcome Measure Help needed turning from your back to your side while in a flat bed without using bedrails?: A Little Help needed moving from lying on your back to sitting on the side of a flat bed without using bedrails?: A Little Help needed moving to and from a bed to a chair (including a wheelchair)?: A Little Help needed standing up from a chair using your arms (e.g., wheelchair or bedside chair)?: A Little Help needed to walk in hospital room?: A Little Help needed climbing 3-5 steps with a railing? : Total 6 Click Score: 16    End of Session Equipment Utilized During Treatment: Gait belt Activity Tolerance: Patient tolerated treatment well Patient left: in chair;with call bell/phone within reach;with chair alarm set Nurse Communication: Mobility status PT Visit Diagnosis: Unsteadiness on feet (R26.81);Other abnormalities of gait and mobility (R26.89);Muscle weakness (generalized) (M62.81)    Time: 1610-9604 PT Time Calculation (min) (ACUTE ONLY): 28 min   Charges:   PT Evaluation $PT Eval Low Complexity: 1  Low PT Treatments $Therapeutic Activity: 8-22 mins PT General Charges $$ ACUTE PT VISIT: 1 Visit         Kathlyn [place], PT, DPT [facility]  Rehabilitation Services Physical Therapist Office: [phone] Website: [email or web address]   [place]erton Mount 01/25/2024, 10:31 AM

## 2024-01-25 NOTE — [place] FL2 (Signed)
 [place] MEDICAID FL2 LEVEL OF CARE FORM     IDENTIFICATION  Patient Name: [name] Birthdate: [birth year] Sex: male Admission Date (Current Location): 01/23/2024  [facility] [place] Number:  Producer, television/film/video and Address:  [facility] [place], [address] [place], [place] [address]      Provider Number: [id number]  Attending Physician Name and Address:  Erick Blinks, DO  Relative Name and Phone Number:       Current Level of Care: Hospital Recommended Level of Care: Skilled Nursing Facility Prior Approval Number:    Date Approved/Denied:   PASRR Number: [id number]  Discharge Plan: SNF    Current Diagnoses: Patient Active Problem List   Diagnosis Date Noted   Nausea vomiting and diarrhea 01/24/2024   Thrombocytopenia (HCC) 01/24/2024   SIRS (systemic inflammatory response syndrome) (HCC) 01/24/2024   Intractable nausea and vomiting 01/24/2024   Aspiration into airway 01/24/2024   Hypercoagulable state (HCC) 08/17/2023   Unstable gait 05/14/2023   Peripheral neuropathy 05/14/2023   Bilateral lower extremity edema 05/14/2023   GERD (gastroesophageal reflux disease) 02/06/2023   Chronic lower back pain 02/06/2023   Prediabetes 02/06/2023   Asthma in adult, mild intermittent, uncomplicated 09/21/2021   (HFpEF) heart failure with preserved ejection fraction (HCC) 04/06/2021   Atherosclerosis of aorta (HCC) 04/05/2021   PAD (peripheral artery disease) (HCC) 04/05/2021   AKI (acute kidney injury) (HCC) 03/12/2021   PAF (paroxysmal atrial fibrillation) (HCC) 12/24/2020   Presence of permanent cardiac pacemaker 09/12/2020   Essential hypertension 09/12/2020   Anemia 09/12/2020   Expressive aphasia 09/12/2020   TIA (transient ischemic attack) 09/12/2020    Orientation RESPIRATION BLADDER Height & Weight     Self, Time, Situation, Place  Normal Incontinent, External catheter Weight: 154 lb (69.9 kg) Height:  5\' 6"  (167.6 cm)   BEHAVIORAL SYMPTOMS/MOOD NEUROLOGICAL BOWEL NUTRITION STATUS      Continent Diet (See dc summary)  AMBULATORY STATUS COMMUNICATION OF NEEDS Skin   Limited Assist Verbally Normal                       Personal Care Assistance Level of Assistance  Bathing, Feeding, Dressing Bathing Assistance: Limited assistance Feeding assistance: Independent Dressing Assistance: Limited assistance     Functional Limitations Info  Hearing   Hearing Info: Impaired      SPECIAL CARE FACTORS FREQUENCY  PT (By licensed PT), OT (By licensed OT)     PT Frequency: 5x/week OT Frequency: 5x/week            Contractures Contractures Info: Not present    Additional Factors Info  Code Status, Allergies Code Status Info: Full Allergies Info: NKA           Current Medications (01/25/2024):  This is the current hospital active medication list Current Facility-Administered Medications  Medication Dose Route Frequency Provider Last Rate Last Admin   0.9 %  sodium chloride infusion   Intravenous Continuous Sherryll Burger, Pratik D, DO       acetaminophen (TYLENOL) tablet 650 mg  650 mg Oral Q6H PRN Eduard Clos, MD       albuterol (PROVENTIL) (2.5 MG/3ML) 0.083% nebulizer solution 3 mL  3 mL Inhalation QID PRN Eduard Clos, MD       amLODipine (NORVASC) tablet 2.5 mg  2.5 mg Oral Daily Sherryll Burger, Pratik D, DO       apixaban (ELIQUIS) tablet 5 mg  5 mg Oral BID Midge Minium  N, MD   5 mg at 01/25/24 0959   atorvastatin (LIPITOR) tablet 40 mg  40 mg Oral Daily Eduard Clos, MD   40 mg at 01/25/24 1000   budesonide (PULMICORT) nebulizer solution 0.25 mg  0.25 mg Inhalation BID Eduard Clos, MD   0.25 mg at 01/25/24 4782   ferrous sulfate tablet 325 mg  325 mg Oral QHS Eduard Clos, MD   325 mg at 01/25/24 1000   ipratropium-albuterol (DUONEB) 0.5-2.5 (3) MG/3ML nebulizer solution 3 mL  3 mL Nebulization Q4H Eduard Clos, MD   3 mL at 01/25/24 1141   magnesium  oxide (MAG-OX) tablet 400 mg  400 mg Oral q morning Eduard Clos, MD   400 mg at 01/25/24 1000   omega-3 acid ethyl esters (LOVAZA) capsule 1 g  1 g Oral Daily Eduard Clos, MD   1 g at 01/25/24 1000   ondansetron (ZOFRAN) injection 4 mg  4 mg Intravenous Once Eduard Clos, MD       Oral care mouth rinse  15 mL Mouth Rinse PRN Jonah Blue, MD       pantoprazole (PROTONIX) EC tablet 40 mg  40 mg Oral Daily Eduard Clos, MD   40 mg at 01/25/24 1000   timolol (TIMOPTIC) 0.5 % ophthalmic solution 1 drop  1 drop Both Eyes Daily Jonah Blue, MD         Discharge Medications: Please see discharge summary for a list of discharge medications.  Relevant Imaging Results:  Relevant Lab Results:   Additional Information SSn: [id number] [place], [place]

## 2024-01-25 NOTE — Progress Notes (Signed)
 PROGRESS NOTE    [name]  GNF:[id number] DOB: 1941/08/28 DOA: 01/23/2024 PCP: [place], Betty G, MD   Brief Narrative:   83yo with h/o afib, TIA, pacemaker, and anemia who presented on 3/12 with intractable n/v/d. Found to have AKI that is slowly improving with IV fluid hydration.  PT recommending SNF on discharge.  Assessment & Plan:   Principal Problem:   Nausea vomiting and diarrhea Active Problems:   TIA (transient ischemic attack)   PAF (paroxysmal atrial fibrillation) (HCC)   AKI (acute kidney injury) (HCC)   (HFpEF) heart failure with preserved ejection fraction (HCC)   Asthma in adult, mild intermittent, uncomplicated   Thrombocytopenia (HCC)   Aspiration into airway  Assessment and Plan:   Intractable n/v/d-resolved Patient with acute onset of symptoms about 24 hours PTA Symptoms have resolved at this time CT A/P negative Now tolerating regular diet GI pathogen panel pending Now has significant weakness and PT/OT recommending SNF placement   Aspiration event CT A/P also showed concern for aspiration Patient with scant cough and no other symptoms He was given Unasyn but does not have obvious infection at this time so will hold additional antibiotics and continue to monitor   AKI-slowly improving Baseline creatinine 0.8, was 1.33 on admission Appears to have peaked, slightly improved Continue gentle overnight IVF hydration while awaiting SNF placement.   HTN Improved, plan to resume amlodipine 3/14 Continue IVF for AKI   Afib Not on rate controlling medications Has pacemaker Continue Eliquis   Anemia. normocytic Mild, stable Unremarkable iron panel other than low iron Continue FeSO4 supplementation   Thrombocytopenia Chronic, stable   Recurrent TIA S/p L CEA Continue Eliquis, statin   GERD Continue PPI   Asthma Continue Arnuity, Albuterol   Glaucoma Continue timolol     DVT prophylaxis:apixaban Code Status: Full Family  Communication: None at bedside Disposition Plan:  Status is: Inpatient Remains inpatient appropriate because: Need for IV fluids and SNF placement.   Consultants:  None  Procedures:  None  Antimicrobials:  Anti-infectives (From admission, onward)    Start     Dose/Rate Route Frequency Ordered Stop   01/24/24 0230  Ampicillin-Sulbactam (UNASYN) 3 g in sodium chloride 0.9 % 100 mL IVPB  Status:  Discontinued        3 g 200 mL/hr over 30 Minutes Intravenous Every 6 hours 01/24/24 0224 01/24/24 1517       Subjective: Patient seen and evaluated today with no new acute complaints or concerns. No acute concerns or events noted overnight.  He states that he is feeling weak and has difficulty with ambulation and would like to see PT.  Objective: Vitals:   01/24/24 2314 01/25/24 0346 01/25/24 0800 01/25/24 1000  BP: (!) 130/54 (!) 139/48 (!) 151/66   Pulse: 70 65 67 75  Resp: 17 15 13  (!) 21  Temp: 97.8 F (36.6 C) 98.4 F (36.9 C)    TempSrc: Oral Oral    SpO2: 94% 91% 92% 92%  Weight:      Height:        Intake/Output Summary (Last 24 hours) at 01/25/2024 1222 Last data filed at 01/25/2024 1000 Gross per 24 hour  Intake 2914.81 ml  Output 700 ml  Net 2214.81 ml   Filed Weights   01/23/24 1500  Weight: 69.9 kg    Examination:  General exam: Appears calm and comfortable, hard of hearing Respiratory system: Clear to auscultation. Respiratory effort normal. Cardiovascular system: S1 & S2 heard, RRR.  Gastrointestinal  system: Abdomen is soft Central nervous system: Alert and awake Extremities: No edema Skin: No significant lesions noted Psychiatry: Flat affect.    Data Reviewed: I have personally reviewed following labs and imaging studies  CBC: Recent Labs  Lab 01/23/24 1456 01/24/24 0603  WBC 14.3* 10.5  NEUTROABS 12.9* 9.9*  HGB 12.7* 11.1*  HCT 36.7* 32.5*  MCV 96.8 97.3  PLT 137* 126*   Basic Metabolic Panel: Recent Labs  Lab 01/23/24 1456  01/24/24 0603 01/25/24 0539  NA 135 132* 135  K 3.8 3.7 3.6  CL 104 105 107  CO2 22 20* 21*  GLUCOSE 100* 114* 112*  BUN 35* 35* 24*  CREATININE 1.33* 1.31* 1.26*  CALCIUM 7.8* 7.5* 7.8*  MG 1.7  --  1.9   GFR: Estimated Creatinine Clearance: 40.1 mL/min (A) (by C-G formula based on SCr of 1.26 mg/dL (H)). Liver Function Tests: Recent Labs  Lab 01/23/24 1456 01/24/24 0603  AST 28 25  ALT 22 20  ALKPHOS 63 61  BILITOT 1.0 0.8  PROT 5.4* 5.0*  ALBUMIN 2.9* 2.6*   Recent Labs  Lab 01/23/24 1456  LIPASE 22   No results for input(s): "AMMONIA" in the last 168 hours. Coagulation Profile: No results for input(s): "INR", "PROTIME" in the last 168 hours. Cardiac Enzymes: No results for input(s): "CKTOTAL", "CKMB", "CKMBINDEX", "TROPONINI" in the last 168 hours. BNP (last 3 results) No results for input(s): "PROBNP" in the last 8760 hours. HbA1C: No results for input(s): "HGBA1C" in the last 72 hours. CBG: No results for input(s): "GLUCAP" in the last 168 hours. Lipid Profile: No results for input(s): "CHOL", "HDL", "LDLCALC", "TRIG", "CHOLHDL", "LDLDIRECT" in the last 72 hours. Thyroid Function Tests: No results for input(s): "TSH", "T4TOTAL", "FREET4", "T3FREE", "THYROIDAB" in the last 72 hours. Anemia Panel: Recent Labs    01/24/24 0603  VITAMINB12 242  FOLATE 28.3  FERRITIN 101  TIBC 238*  IRON 23*  RETICCTPCT 1.1   Sepsis Labs: Recent Labs  Lab 01/23/24 2340  LATICACIDVEN 1.1    Recent Results (from the past 240 hours)  Resp panel by RT-PCR (RSV, Flu A&B, Covid) Anterior Nasal Swab     Status: None   Collection Time: 01/23/24  4:11 PM   Specimen: Anterior Nasal Swab  Result Value Ref Range Status   SARS Coronavirus 2 by RT PCR NEGATIVE NEGATIVE Final   Influenza A by PCR NEGATIVE NEGATIVE Final   Influenza B by PCR NEGATIVE NEGATIVE Final    Comment: (NOTE) The Xpert Xpress SARS-CoV-2/FLU/RSV plus assay is intended as an aid in the diagnosis of  influenza from Nasopharyngeal swab specimens and should not be used as a sole basis for treatment. Nasal washings and aspirates are unacceptable for Xpert Xpress SARS-CoV-2/FLU/RSV testing.  Fact Sheet for Patients: BloggerCourse.com  Fact Sheet for Healthcare Providers: SeriousBroker.it  This test is not yet approved or cleared by the [place] FDA and has been authorized for detection and/or diagnosis of SARS-CoV-2 by FDA under an Emergency Use Authorization (EUA). This EUA will remain in effect (meaning this test can be used) for the duration of the COVID-19 declaration under Section 564(b)(1) of the Act, 21 U.S.C. section 360bbb-3(b)(1), unless the authorization is terminated or revoked.     Resp Syncytial Virus by PCR NEGATIVE NEGATIVE Final    Comment: (NOTE) Fact Sheet for Patients: BloggerCourse.com  Fact Sheet for Healthcare Providers: SeriousBroker.it  This test is not yet approved or cleared by the [place] and has been authorized  for detection and/or diagnosis of SARS-CoV-2 by FDA under an Emergency Use Authorization (EUA). This EUA will remain in effect (meaning this test can be used) for the duration of the COVID-19 declaration under Section 564(b)(1) of the Act, 21 U.S.C. section 360bbb-3(b)(1), unless the authorization is terminated or revoked.  Performed at [facility], [address] [place], [place] [address]   C Difficile Quick Screen w PCR reflex     Status: None   Collection Time: 01/24/24  7:29 PM   Specimen: Stool  Result Value Ref Range Status   C Diff antigen NEGATIVE NEGATIVE Final   C Diff toxin NEGATIVE NEGATIVE Final   C Diff interpretation No C. difficile detected.  Final    Comment: Performed at [facility], [address] [place], [place] [address]         Radiology Studies: CT ABDOMEN PELVIS W  CONTRAST Result Date: 01/23/2024 CLINICAL DATA:  Acute nonlocalized abdominal pain EXAM: CT ABDOMEN AND PELVIS WITH CONTRAST TECHNIQUE: Multidetector CT imaging of the abdomen and pelvis was performed using the standard protocol following bolus administration of intravenous contrast. RADIATION DOSE REDUCTION: This exam was performed according to the departmental dose-optimization program which includes automated exposure control, adjustment of the mA and/or kV according to patient size and/or use of iterative reconstruction technique. CONTRAST:  75mL OMNIPAQUE IOHEXOL 350 MG/ML SOLN COMPARISON:  None Available. FINDINGS: Lower chest: There is bronchial wall thickening in keeping with airway inflammation and peribronchial infiltrate within the right middle and lower lobes which may relate to changes of multifocal infection or aspiration. Large hiatal hernia. Pacemaker leads are seen within the right heart. Extensive right coronary artery calcification. Hepatobiliary: Scattered hypodensities are seen throughout the liver which are too small accurately characterize likely represent cysts in a patient without history of malignancy. The liver is otherwise unremarkable. Gallbladder. No intra or extrahepatic biliary ductal dilation. Pancreas: Unremarkable Spleen: Unremarkable Adrenals/Urinary Tract: Adrenal glands are unremarkable. Kidneys are normal, without renal calculi, focal lesion, or hydronephrosis. Bladder is unremarkable. Stomach/Bowel: Stomach is largely thoracic in position. Small large bowel are unremarkable. Appendix normal. No free intraperitoneal gas or fluid. Vascular/Lymphatic: Aortic atherosclerosis. No enlarged abdominal or pelvic lymph nodes. Reproductive: Prostate is unremarkable. Other: Tiny fat containing bilateral hernias. Musculoskeletal: Osseous structures are diffusely osteopenic. 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 No acute  bone abnormality IMPRESSION: 1. No acute intra-abdominal pathology identified. 2. Bronchial wall thickening in keeping with airway inflammation and peribronchial infiltrate within the right middle and lower lobes which may relate to changes of multifocal infection or aspiration. 3. Large hiatal hernia. 4. Extensive right coronary artery calcification. Aortic Atherosclerosis (ICD10-I70.0). Electronically Signed   By: Helyn Numbers M.D.   On: 01/23/2024 21:21   DG Chest 2 View Result Date: 01/23/2024 CLINICAL DATA:  Cough. EXAM: CHEST - 2 VIEW COMPARISON:  August 12, 2023. FINDINGS: The heart size and mediastinal contours are within normal limits. Left-sided pacemaker is unchanged. Minimal bibasilar subsegmental atelectasis is noted. Moderate size hiatal hernia is noted. The visualized skeletal structures are unremarkable. IMPRESSION: Hiatal hernia.  Minimal bibasilar subsegmental atelectasis. Electronically Signed   By: Lupita Raider M.D.   On: 01/23/2024 18:45        Scheduled Meds:  apixaban  5 mg Oral BID   atorvastatin  40 mg Oral Daily   budesonide  0.25 mg Inhalation BID   ferrous sulfate  325 mg Oral QHS   ipratropium-albuterol  3 mL Nebulization Q4H   magnesium oxide  400 mg Oral q morning   omega-3 acid ethyl esters  1 g Oral Daily   ondansetron (ZOFRAN) IV  4 mg Intravenous Once   pantoprazole  40 mg Oral Daily     LOS: 1 day    Time spent: 35 minutes    Tayler Lassen Hoover Brunette, DO [facility]  If 7PM-7AM, please contact night-coverage [email or web address] 01/25/2024, 12:22 PM

## 2024-01-26 DIAGNOSIS — T17908A Unspecified foreign body in respiratory tract, part unspecified causing other injury, initial encounter: Secondary | ICD-10-CM

## 2024-01-26 DIAGNOSIS — R1084 Generalized abdominal pain: Secondary | ICD-10-CM | POA: Diagnosis not present

## 2024-01-26 DIAGNOSIS — G459 Transient cerebral ischemic attack, unspecified: Secondary | ICD-10-CM

## 2024-01-26 DIAGNOSIS — D696 Thrombocytopenia, unspecified: Secondary | ICD-10-CM

## 2024-01-26 DIAGNOSIS — I5032 Chronic diastolic (congestive) heart failure: Secondary | ICD-10-CM

## 2024-01-26 DIAGNOSIS — R112 Nausea with vomiting, unspecified: Secondary | ICD-10-CM | POA: Diagnosis not present

## 2024-01-26 DIAGNOSIS — N179 Acute kidney failure, unspecified: Secondary | ICD-10-CM | POA: Diagnosis not present

## 2024-01-26 LAB — BASIC METABOLIC PANEL
Anion gap: 3 — ABNORMAL LOW (ref 5–15)
BUN: 20 mg/dL (ref 8–23)
CO2: 25 mmol/L (ref 22–32)
Calcium: 8 mg/dL — ABNORMAL LOW (ref 8.9–10.3)
Chloride: 108 mmol/L (ref 98–111)
Creatinine, Ser: 0.95 mg/dL (ref 0.61–1.24)
GFR, Estimated: >60 mL/min (ref >60)
Glucose, Bld: 95 mg/dL (ref 70–99)
Potassium: 3.7 mmol/L (ref 3.5–5.1)
Sodium: 136 mmol/L (ref 135–145)

## 2024-01-26 LAB — CBC
HCT: 30 % — ABNORMAL LOW (ref 39.0–52.0)
Hemoglobin: 10.3 g/dL — ABNORMAL LOW (ref 13.0–17.0)
MCH: 32.9 pg (ref 26.0–34.0)
MCHC: 34.3 g/dL (ref 30.0–36.0)
MCV: 95.8 fL (ref 80.0–100.0)
Platelets: 128 10*3/uL — ABNORMAL LOW (ref 150–400)
RBC: 3.13 MIL/uL — ABNORMAL LOW (ref 4.22–5.81)
RDW: 14 % (ref 11.5–15.5)
WBC: 5.4 10*3/uL (ref 4.0–10.5)
nRBC: 0 % (ref 0.0–0.2)

## 2024-01-26 LAB — MAGNESIUM: Magnesium: 1.8 mg/dL (ref 1.7–2.4)

## 2024-01-26 NOTE — Discharge Summary (Signed)
 Physician Discharge Summary   Patient: [name] MRN: [id number] DOB: September 09, 1941  Admit date:     01/23/2024  Discharge date: 01/26/24  Discharge Physician: Arnetha Courser   PCP: [place], Betty G, MD   Recommendations at discharge:  Please obtain CBC and BMP and follow-up Follow-up with primary care provider within a week.  Discharge Diagnoses: Principal Problem:   Nausea vomiting and diarrhea Active Problems:   TIA (transient ischemic attack)   PAF (paroxysmal atrial fibrillation) (HCC)   AKI (acute kidney injury) (HCC)   (HFpEF) heart failure with preserved ejection fraction (HCC)   Asthma in adult, mild intermittent, uncomplicated   Thrombocytopenia (HCC)   Aspiration into airway   Generalized abdominal pain  Resolved Problems:   * No resolved hospital problems. [facility] Course: 83yo with h/o afib, TIA, pacemaker, and anemia who presented on 3/12 with intractable n/v/d.  CT abdomen and pelvis was negative for any acute abnormality.  GI pathogen panel with Yersinia enterocolitica and norovirus.  Symptoms has been resolved and patient is tolerating diet well.  Patient was also found to have AKI likely due to GI losses which has been improved with IV fluid before discharge.  Due to initial weakness our physical therapist recommended rehab, patient continued to improve and later their recommendation changed to home health.  Patient wants to go back to his independent facility where he is being discharged.  Patient was also found to have stable chronic thrombocytopenia and normocytic anemia.  He will continue his supplement.  Patient will continue the rest of his home medications and need to have a close follow-up with his providers for further recommendation.  Consultants: None Procedures performed: None Disposition: Home Diet recommendation:  Discharge Diet Orders (From admission, onward)     Start     Ordered   01/26/24 0000  Diet - low sodium heart healthy         01/26/24 1408           Cardiac diet DISCHARGE MEDICATION: Allergies as of 01/26/2024   No Known Allergies      Medication List     TAKE these medications    acetaminophen 325 MG tablet Commonly known as: Tylenol Take 2 tablets (650 mg total) by mouth every 6 (six) hours as needed. What changed: reasons to take this   albuterol 108 (90 Base) MCG/ACT inhaler Commonly known as: VENTOLIN HFA Inhale 2 puffs into the lungs 4 (four) times daily as needed for wheezing or shortness of breath.   amLODipine 2.5 MG tablet Commonly known as: NORVASC TAKE 1 TABLET BY MOUTH EVERY DAY   Arnuity Ellipta 200 MCG/ACT Aepb Generic drug: Fluticasone Furoate INHALE 1 PUFF INTO THE LUNGS EVERY MORNING.   atorvastatin 40 MG tablet Commonly known as: LIPITOR Take 1 tablet (40 mg total) by mouth daily.   Calcium 250 MG Caps Take 1 capsule by mouth daily.   cholecalciferol 25 MCG (1000 UNIT) tablet Commonly known as: VITAMIN D3 Take 1,000 Units by mouth at bedtime.   docusate sodium 50 MG capsule Commonly known as: COLACE Take 100 mg by mouth 2 (two) times daily.   Eliquis 5 MG Tabs tablet Generic drug: apixaban TAKE 1 TABLET BY MOUTH TWICE A DAY   FISH OIL PO Take 1 capsule by mouth every morning.   Iron 325 (65 Fe) MG Tabs Take 1 tablet by mouth at bedtime.   Magnesium 250 MG Tabs Take 250 mg by mouth every morning.   multivitamin with  minerals Tabs tablet Take 1 tablet by mouth every morning.   omeprazole 40 MG capsule Commonly known as: PRILOSEC TAKE 1 CAPSULE BY MOUTH DAILY BEFORE SUPPER. What changed: See the new instructions.   TIMOLOL MALEATE OP Place 1 drop into both eyes in the morning. 1 drop into both eyes every morning        Follow-up Information     [place], Betty G, MD. Schedule an appointment as soon as possible for a visit in 1 week(s).   Specialty: Family Medicine Contact information: [address] [place]  [address] [phone]                Discharge Exam: Ceasar Mons Weights   01/23/24 1500  Weight: 69.9 kg   General.  Well-developed gentleman, in no acute distress. Pulmonary.  Lungs clear bilaterally, normal respiratory effort. CV.  Regular rate and rhythm, no JVD, rub or murmur. Abdomen.  Soft, nontender, nondistended, BS positive. CNS.  Alert and oriented .  No focal neurologic deficit. Extremities.  No edema, no cyanosis, pulses intact and symmetrical. Psychiatry.  Judgment and insight appears normal.   Condition at discharge: stable  The results of significant diagnostics from this hospitalization (including imaging, microbiology, ancillary and laboratory) are listed below for reference.   Imaging Studies: CT ABDOMEN PELVIS W CONTRAST Result Date: 01/23/2024 CLINICAL DATA:  Acute nonlocalized abdominal pain EXAM: CT ABDOMEN AND PELVIS WITH CONTRAST TECHNIQUE: Multidetector CT imaging of the abdomen and pelvis was performed using the standard protocol following bolus administration of intravenous contrast. RADIATION DOSE REDUCTION: This exam was performed according to the departmental dose-optimization program which includes automated exposure control, adjustment of the mA and/or kV according to patient size and/or use of iterative reconstruction technique. CONTRAST:  75mL OMNIPAQUE IOHEXOL 350 MG/ML SOLN COMPARISON:  None Available. FINDINGS: Lower chest: There is bronchial wall thickening in keeping with airway inflammation and peribronchial infiltrate within the right middle and lower lobes which may relate to changes of multifocal infection or aspiration. Large hiatal hernia. Pacemaker leads are seen within the right heart. Extensive right coronary artery calcification. Hepatobiliary: Scattered hypodensities are seen throughout the liver which are too small accurately characterize likely represent cysts in a patient without history of malignancy. The liver is otherwise unremarkable.  Gallbladder. No intra or extrahepatic biliary ductal dilation. Pancreas: Unremarkable Spleen: Unremarkable Adrenals/Urinary Tract: Adrenal glands are unremarkable. Kidneys are normal, without renal calculi, focal lesion, or hydronephrosis. Bladder is unremarkable. Stomach/Bowel: Stomach is largely thoracic in position. Small large bowel are unremarkable. Appendix normal. No free intraperitoneal gas or fluid. Vascular/Lymphatic: Aortic atherosclerosis. No enlarged abdominal or pelvic lymph nodes. Reproductive: Prostate is unremarkable. Other: Tiny fat containing bilateral hernias. Musculoskeletal: Osseous structures are diffusely osteopenic. 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 No acute bone abnormality IMPRESSION: 1. No acute intra-abdominal pathology identified. 2. Bronchial wall thickening in keeping with airway inflammation and peribronchial infiltrate within the right middle and lower lobes which may relate to changes of multifocal infection or aspiration. 3. Large hiatal hernia. 4. Extensive right coronary artery calcification. Aortic Atherosclerosis (ICD10-I70.0). Electronically Signed   By: Helyn Numbers M.D.   On: 01/23/2024 21:21   DG Chest 2 View Result Date: 01/23/2024 CLINICAL DATA:  Cough. EXAM: CHEST - 2 VIEW COMPARISON:  August 12, 2023. FINDINGS: The heart size and mediastinal contours are within normal limits. Left-sided pacemaker is unchanged. Minimal bibasilar subsegmental atelectasis is noted. Moderate size hiatal hernia is noted. The visualized skeletal  structures are unremarkable. IMPRESSION: Hiatal hernia.  Minimal bibasilar subsegmental atelectasis. Electronically Signed   By: Lupita Raider M.D.   On: 01/23/2024 18:45    Microbiology: Results for orders placed or performed during the hospital encounter of 01/23/24  Resp panel by RT-PCR (RSV, Flu A&B, Covid) Anterior Nasal Swab     Status: None   Collection  Time: 01/23/24  4:11 PM   Specimen: Anterior Nasal Swab  Result Value Ref Range Status   SARS Coronavirus 2 by RT PCR NEGATIVE NEGATIVE Final   Influenza A by PCR NEGATIVE NEGATIVE Final   Influenza B by PCR NEGATIVE NEGATIVE Final    Comment: (NOTE) The Xpert Xpress SARS-CoV-2/FLU/RSV plus assay is intended as an aid in the diagnosis of influenza from Nasopharyngeal swab specimens and should not be used as a sole basis for treatment. Nasal washings and aspirates are unacceptable for Xpert Xpress SARS-CoV-2/FLU/RSV testing.  Fact Sheet for Patients: BloggerCourse.com  Fact Sheet for Healthcare Providers: SeriousBroker.it  This test is not yet approved or cleared by the [place] FDA and has been authorized for detection and/or diagnosis of SARS-CoV-2 by FDA under an Emergency Use Authorization (EUA). This EUA will remain in effect (meaning this test can be used) for the duration of the COVID-19 declaration under Section 564(b)(1) of the Act, 21 U.S.C. section 360bbb-3(b)(1), unless the authorization is terminated or revoked.     Resp Syncytial Virus by PCR NEGATIVE NEGATIVE Final    Comment: (NOTE) Fact Sheet for Patients: BloggerCourse.com  Fact Sheet for Healthcare Providers: SeriousBroker.it  This test is not yet approved or cleared by the [place] FDA and has been authorized for detection and/or diagnosis of SARS-CoV-2 by FDA under an Emergency Use Authorization (EUA). This EUA will remain in effect (meaning this test can be used) for the duration of the COVID-19 declaration under Section 564(b)(1) of the Act, 21 U.S.C. section 360bbb-3(b)(1), unless the authorization is terminated or revoked.  Performed at [facility], [address] [place], [place] [address]   Gastrointestinal Panel by PCR , Stool     Status: Abnormal   Collection Time: 01/24/24   5:29 PM   Specimen: Stool  Result Value Ref Range Status   Campylobacter species NOT DETECTED NOT DETECTED Final   Plesimonas shigelloides NOT DETECTED NOT DETECTED Final   Salmonella species NOT DETECTED NOT DETECTED Final   Yersinia enterocolitica DETECTED (A) NOT DETECTED Final    Comment: RESULT CALLED TO, READ BACK BY AND VERIFIED WITH: Army Fossa AT 1413 01/25/24.PMF    Vibrio species NOT DETECTED NOT DETECTED Final   Vibrio cholerae NOT DETECTED NOT DETECTED Final   Enteroaggregative E coli (EAEC) NOT DETECTED NOT DETECTED Final   Enteropathogenic E coli (EPEC) NOT DETECTED NOT DETECTED Final   Enterotoxigenic E coli (ETEC) NOT DETECTED NOT DETECTED Final   Shiga like toxin producing E coli (STEC) NOT DETECTED NOT DETECTED Final   Shigella/Enteroinvasive E coli (EIEC) NOT DETECTED NOT DETECTED Final   Cryptosporidium NOT DETECTED NOT DETECTED Final   Cyclospora cayetanensis NOT DETECTED NOT DETECTED Final   Entamoeba histolytica NOT DETECTED NOT DETECTED Final   Giardia lamblia NOT DETECTED NOT DETECTED Final   Adenovirus F40/41 NOT DETECTED NOT DETECTED Final   Astrovirus NOT DETECTED NOT DETECTED Final   Norovirus GI/GII DETECTED (A) NOT DETECTED Final    Comment: RESULT CALLED TO, READ BACK BY AND VERIFIED WITH: Army Fossa AT 1413 01/25/24.PMF    Rotavirus A NOT DETECTED NOT  DETECTED Final   Sapovirus (I, II, IV, and V) NOT DETECTED NOT DETECTED Final    Comment: Performed at [facility], [address] [place], [place] [address]  C Difficile Quick Screen w PCR reflex     Status: None   Collection Time: 01/24/24  7:29 PM   Specimen: Stool  Result Value Ref Range Status   C Diff antigen NEGATIVE NEGATIVE Final   C Diff toxin NEGATIVE NEGATIVE Final   C Diff interpretation No C. difficile detected.  Final    Comment: Performed at [facility], [address], [place], [place] [address]    Labs: CBC: Recent Labs  Lab 01/23/24 1456  01/24/24 0603 01/26/24 0359  WBC 14.3* 10.5 5.4  NEUTROABS 12.9* 9.9*  --   HGB 12.7* 11.1* 10.3*  HCT 36.7* 32.5* 30.0*  MCV 96.8 97.3 95.8  PLT 137* 126* 128*   Basic Metabolic Panel: Recent Labs  Lab 01/23/24 1456 01/24/24 0603 01/25/24 0539 01/26/24 0359  NA 135 132* 135 136  K 3.8 3.7 3.6 3.7  CL 104 105 107 108  CO2 22 20* 21* 25  GLUCOSE 100* 114* 112* 95  BUN 35* 35* 24* 20  CREATININE 1.33* 1.31* 1.26* 0.95  CALCIUM 7.8* 7.5* 7.8* 8.0*  MG 1.7  --  1.9 1.8   Liver Function Tests: Recent Labs  Lab 01/23/24 1456 01/24/24 0603  AST 28 25  ALT 22 20  ALKPHOS 63 61  BILITOT 1.0 0.8  PROT 5.4* 5.0*  ALBUMIN 2.9* 2.6*   CBG: No results for input(s): "GLUCAP" in the last 168 hours.  Discharge time spent: greater than 30 minutes.  This record has been created using Conservation officer, historic buildings. Errors have been sought and corrected,but may not always be located. Such creation errors do not reflect on the standard of care.   Signed: Arnetha Courser, MD [facility] 01/26/2024

## 2024-01-26 NOTE — Care Management (Signed)
 Spoke w patient and wife to set up DC plan. Patient will return to [facility] via [facility], they would like for him to continue with in house therapist at his ILF as this was established prior to admission. Wife understands that this will continue as scheduled prior to admission.  Verified address, [facility] forms provided to unit clerk Discussed DC plan w attending

## 2024-01-26 NOTE — Plan of Care (Signed)
 ?  Problem: Clinical Measurements: ?Goal: Ability to maintain clinical measurements within normal limits will improve ?Outcome: Progressing ?Goal: Will remain free from infection ?Outcome: Progressing ?Goal: Diagnostic test results will improve ?Outcome: Progressing ?  ?

## 2024-01-26 NOTE — Progress Notes (Signed)
 Physical Therapy Treatment Patient Details Name: [name] MRN: [id number] DOB: [birth year] Today's Date: 01/26/2024   History of Present Illness 83 yo who presented on 3/12 with intractable n/v/d. Found to have AKI. PMHx: h/o afib, TIA, pacemaker, and anemia.    PT Comments  Doing much better today. Mobilizing at a supervision level with RW for support. Transfers and able to ambulate >100 feet slowly without overt LOB. Requires RW to stabilize with mobility. Stood at sink unsupported to wash hands. He would like to go home and is open to HHPT visit to progress independence further. Wife available to assist per pt report. Recommendation updated to HHPT. Will follow and progress during admission.   If plan is discharge home, recommend the following: A little help with walking and/or transfers;A little help with bathing/dressing/bathroom;Assistance with cooking/housework;Assist for transportation   Can travel by private vehicle     Yes  Equipment Recommendations  None recommended by PT    Recommendations for Other Services       Precautions / Restrictions Precautions Precautions: Fall Restrictions Weight Bearing Restrictions Per Provider Order: No     Mobility  Bed Mobility               General bed mobility comments: in recliner    Transfers Overall transfer level: Needs assistance Equipment used: Rolling walker (2 wheels) Transfers: Sit to/from Stand Sit to Stand: Supervision           General transfer comment: Supervision for safety, slow to rise but appropriately placed hands on arm rests to push up.    Ambulation/Gait Ambulation/Gait assistance: Supervision Gait Distance (Feet): 110 Feet Assistive device: Rolling walker (2 wheels) Gait Pattern/deviations: Step-through pattern, Decreased stride length, Trunk flexed, Shuffle, Antalgic Gait velocity: dec Gait velocity interpretation: <1.31 ft/sec, indicative of household ambulator   General Gait  Details: Trunk flexed appears to be fixed with scoliosis. Used RW for support. Cues for proximity to device for safety. A little unsteady but able to self correct. Slow and guarded but no evidence of overt LOB or buckling. SpO2 93% on RA. Cues for safety, awareness. Antalgic with complaints of mild Lt foot pain.   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 Communication: Impaired Factors Affecting Communication: Hearing impaired  Cognition Arousal: Alert Behavior During Therapy: WFL for tasks assessed/performed   PT - Cognitive impairments: No apparent impairments                         Following commands: Intact      Cueing Cueing Techniques: Verbal cues  Exercises      General Comments General comments (skin integrity, edema, etc.): VSS on RA      Pertinent Vitals/Pain Pain Assessment Pain Assessment: No/denies pain    Home Living  Prior Function            PT Goals (current goals can now be found in the care plan section) Acute Rehab PT Goals Patient Stated Goal: Get stronger before going home PT Goal Formulation: With patient Time For Goal Achievement: 02/08/24 Potential to Achieve Goals: Good Progress towards PT goals: Progressing toward goals    Frequency    Min 2X/week      PT Plan      Co-evaluation              AM-PAC PT "6 Clicks" Mobility   Outcome Measure  Help needed turning from your back to your side while in a flat  bed without using bedrails?: A Little Help needed moving from lying on your back to sitting on the side of a flat bed without using bedrails?: A Little Help needed moving to and from a bed to a chair (including a wheelchair)?: A Little Help needed standing up from a chair using your arms (e.g., wheelchair or bedside chair)?: A Little Help needed to walk in hospital room?: A Little Help needed climbing 3-5 steps with a railing? : A Lot 6 Click Score: 17    End of Session Equipment Utilized During Treatment: Gait belt Activity Tolerance: Patient tolerated treatment well Patient left: in chair;with call bell/phone within reach;with chair alarm set Nurse Communication: Mobility status PT Visit Diagnosis: Unsteadiness on feet (R26.81);Other abnormalities of gait and mobility (R26.89);Muscle weakness (generalized) (M62.81)     Time: 1203-1226 PT Time Calculation (min) (ACUTE ONLY): 23 min  Charges:    $Gait Training: 8-22 mins $Therapeutic Activity: 8-22 mins PT General Charges $$ ACUTE PT VISIT: 1 Visit                     Kathlyn Sacramento, PT, DPT [facility]  Rehabilitation Services Physical Therapist Office: [phone] Website: [email or web address]    Berton Mount 01/26/2024, 1:27 PM

## 2024-01-26 NOTE — Plan of Care (Signed)

## 2024-01-28 ENCOUNTER — Ambulatory Visit: Admitting: Family Medicine

## 2024-01-28 ENCOUNTER — Encounter: Payer: Self-pay | Admitting: Family Medicine

## 2024-01-28 ENCOUNTER — Telehealth: Payer: Self-pay

## 2024-01-28 ENCOUNTER — Ambulatory Visit

## 2024-01-28 VITALS — BP 126/80 | HR 67 | Resp 16 | Ht 66.0 in

## 2024-01-28 DIAGNOSIS — I5032 Chronic diastolic (congestive) heart failure: Secondary | ICD-10-CM

## 2024-01-28 DIAGNOSIS — I10 Essential (primary) hypertension: Secondary | ICD-10-CM | POA: Diagnosis not present

## 2024-01-28 DIAGNOSIS — J4521 Mild intermittent asthma with (acute) exacerbation: Secondary | ICD-10-CM

## 2024-01-28 DIAGNOSIS — R600 Localized edema: Secondary | ICD-10-CM

## 2024-01-28 DIAGNOSIS — D649 Anemia, unspecified: Secondary | ICD-10-CM

## 2024-01-28 MED ORDER — ALBUTEROL SULFATE HFA 108 (90 BASE) MCG/ACT IN AERS: 2.0000 | INHALATION_SPRAY | Freq: Four times a day (QID) | RESPIRATORY_TRACT | 3 refills | Status: AC | PRN

## 2024-01-28 MED ORDER — TORSEMIDE 20 MG PO TABS: 20.0000 mg | ORAL_TABLET | Freq: Every day | ORAL | 0 refills | Status: DC

## 2024-01-28 MED ORDER — PREDNISONE 20 MG PO TABS: 40.0000 mg | ORAL_TABLET | Freq: Every day | ORAL | 0 refills | Status: AC

## 2024-01-28 NOTE — Assessment & Plan Note (Signed)
 With mild exacerbation for the past 5 days, reports improvement. Today I do not appreciate wheezing. Recommend short treatment with prednisone, which she has tolerated well in the past, we discussed some side effects. Prednisone 40 mg daily with breakfast for 3 days. Albuterol inh 2 puff every 6 hours for a week then as needed for wheezing or shortness of breath.  Continue Arnuity Elipta 1 puff daily. Instructed about warning signs. Further recommendation will be given according to chest x-ray result.

## 2024-01-28 NOTE — Assessment & Plan Note (Signed)
 According to wife, mildly worse since hospital discharge. Recommend torsemide 20 mg daily for 5 days with high potassium diet. Lower extremity elevation and compression stockings recommended. Continue appropriate skin care.

## 2024-01-28 NOTE — Assessment & Plan Note (Signed)
 Normocytic anemia for few years, otherwise stable. On chronic anticoagulation. Currently on Fe sulfate 325 mg daily.

## 2024-01-28 NOTE — Assessment & Plan Note (Addendum)
 BP has been adequately controlled. Continue amlodipine 2.5 mg daily and low-salt diet. Continue monitoring BP regularly.

## 2024-01-28 NOTE — Transitions of Care (Post Inpatient/ED Visit) (Signed)
   01/28/2024  Name: [name] MRN: [id number] DOB: 1941/10/02  Today's TOC FU Call Status: Today's TOC FU Call Status:: Unsuccessful Call (1st Attempt) Unsuccessful Call (1st Attempt) Date: 01/28/24  Attempted to reach the patient regarding the most recent Inpatient/ED visit.  Follow Up Plan: Additional outreach attempts will be made to reach the patient to complete the Transitions of Care (Post Inpatient/ED visit) call.   Lonia Chimera, RN, BSN, CEN [employer]- Transition of Care Team.  [facility] [phone]

## 2024-01-28 NOTE — Progress Notes (Unsigned)
 ACUTE VISIT Chief Complaint  Patient presents with   Hospitalization Follow-up   HPI: [name] is a 83 y.o. male with a PMHx significant for TIA, PAF, HTN, HFpEF, asthma, GERD, peripheral neuropathy, AKI, and prediabetes, among some, who is here today with his wife for hospital follow up. TOC call on [phone] x 2: Unsuccessful.  He was hospitalized from 3/12 to 3/15 for intractable nausea, vomiting, and diarrhea. His GI pathogen panel was positive for Norovirus and Yersinia enterocolitca.  Complicated with AKI and hypotension.  He was discharged back to [facility], and has been having PT there three times per week.  He states that was unable to be admitted to a rehab facility because of the Norovirus infection.   None of his medications were changed in the hospital.   GI symptoms are resolved since hospital discharge. Last bowel movement today, normal. Appetite has improved, not yet at baseline. He reports his sleep has also been improving. Still using a walker to help with transfer.  Lab Results  Component Value Date   NA 136 01/26/2024   CL 108 01/26/2024   K 3.7 01/26/2024   CO2 25 01/26/2024   BUN 20 01/26/2024   CREATININE 0.95 01/26/2024   GFRNONAA >60 01/26/2024   CALCIUM 8.0 (L) 01/26/2024   ALBUMIN 2.6 (L) 01/24/2024   GLUCOSE 95 01/26/2024   Lab Results  Component Value Date   WBC 5.4 01/26/2024   HGB 10.3 (L) 01/26/2024   HCT 30.0 (L) 01/26/2024   MCV 95.8 01/26/2024   PLT 128 (L) 01/26/2024   He says he has gained 5 lbs since his hospital discharge.  His LE edema maybe a "little" worse since hospital discharge. Wife mentions he has a hx of OSA, not on CPAP. Negative orthopnea and PND.  HFpEF: last echo 03/2021 with LVEF 60 to 65%, diastolic parameters are indeterminate. Atrial fibrillation on Eliquis 5 mg twice daily.  Asthma: Currently, patient says he is having some wheezing and mild SOB that has been present since 3/12.  Feeling better  today. He also endorses cough, and some lower abdominal wall soreness from coughing.  He has been using the Arnuity Elipta 1 puff every day, and also at night the last two nights due to persistent wheezing for the past 5 days. He is out of Albuterol, and needs a refill today.   ZOX:WRUEAVW also mentions his BP has been lower than normal at home lately, with systolic readings in the 120s/70-80's.  On the way to the hospital in the ambulance on 3/12, his BP was 80/40.  Currently he is on amlodipine 2.5 mg daily.  Review of Systems  Constitutional:  Positive for activity change, appetite change and fatigue. Negative for chills and fever.  HENT:  Negative for facial swelling and sore throat.   Gastrointestinal:  Negative for abdominal pain, nausea and vomiting.  Endocrine: Negative for cold intolerance and heat intolerance.  Genitourinary:  Negative for decreased urine volume, dysuria and hematuria.  Musculoskeletal:  Positive for arthralgias, back pain and gait problem.  Skin:  Negative for rash.  Neurological:  Negative for syncope and headaches.  Psychiatric/Behavioral:  Negative for confusion and hallucinations.   See other pertinent positives and negatives in HPI.  Current Outpatient Medications on File Prior to Visit  Medication Sig Dispense Refill   acetaminophen (TYLENOL) 325 MG tablet Take 2 tablets (650 mg total) by mouth every 6 (six) hours as needed. (Patient taking differently: Take 650 mg by mouth every  6 (six) hours as needed for mild pain (pain score 1-3), moderate pain (pain score 4-6), headache or fever.) 36 tablet 0   amLODipine (NORVASC) 2.5 MG tablet TAKE 1 TABLET BY MOUTH EVERY DAY 90 tablet 1   ARNUITY ELLIPTA 200 MCG/ACT AEPB INHALE 1 PUFF INTO THE LUNGS EVERY MORNING. 90 each 3   atorvastatin (LIPITOR) 40 MG tablet Take 1 tablet (40 mg total) by mouth daily. 90 tablet 2   Calcium 250 MG CAPS Take 1 capsule by mouth daily.     cholecalciferol (VITAMIN D) 25 MCG (1000  UNIT) tablet Take 1,000 Units by mouth at bedtime.     docusate sodium (COLACE) 50 MG capsule Take 100 mg by mouth 2 (two) times daily.     ELIQUIS 5 MG TABS tablet TAKE 1 TABLET BY MOUTH TWICE A DAY 180 tablet 1   Ferrous Sulfate (IRON) 325 (65 Fe) MG TABS Take 1 tablet by mouth at bedtime.     Magnesium 250 MG TABS Take 250 mg by mouth every morning.     Multiple Vitamin (MULTIVITAMIN WITH MINERALS) TABS tablet Take 1 tablet by mouth every morning.     Omega-3 Fatty Acids (FISH OIL PO) Take 1 capsule by mouth every morning.     omeprazole (PRILOSEC) 40 MG capsule TAKE 1 CAPSULE BY MOUTH DAILY BEFORE SUPPER. (Patient taking differently: Take 40 mg by mouth daily before supper.) 90 capsule 2   TIMOLOL MALEATE OP Place 1 drop into both eyes in the morning. 1 drop into both eyes every morning     No current facility-administered medications on file prior to visit.    Past Medical History:  Diagnosis Date   Carotid artery occlusion    GERD (gastroesophageal reflux disease)    Hypertension    Stroke (HCC) 02/2021   TIA   No Known Allergies  Social History   Socioeconomic History   Marital status: Married    Spouse name: Garment/textile technologist   Number of children: Not on file   Years of education: Not on file   Highest education level: Associate degree: academic program  Occupational History   Not on file  Tobacco Use   Smoking status: Former    Passive exposure: Never   Smokeless tobacco: Never  Vaping Use   Vaping status: Never Used  Substance and Sexual Activity   Alcohol use: Not Currently    Comment: occassional   Drug use: Never   Sexual activity: Not Currently  Other Topics Concern   Not on file  Social History Narrative   Lives with wife   Social Drivers of Health   Financial Resource Strain: Low Risk  (05/11/2023)   Overall Financial Resource Strain (CARDIA)    Difficulty of Paying Living Expenses: Not hard at all  Food Insecurity: No Food Insecurity (01/24/2024)   Hunger Vital  Sign    Worried About Running Out of Food in the Last Year: Never true    Ran Out of Food in the Last Year: Never true  Transportation Needs: No Transportation Needs (01/24/2024)   PRAPARE - Administrator, Civil Service (Medical): No    Lack of Transportation (Non-Medical): No  Physical Activity: Sufficiently Active (05/11/2023)   Exercise Vital Sign    Days of Exercise per Week: 5 days    Minutes of Exercise per Session: 30 min  Recent Concern: Physical Activity - Insufficiently Active (05/11/2023)   Exercise Vital Sign    Days of Exercise per Week: 2 days  Minutes of Exercise per Session: 30 min  Stress: No Stress Concern Present (05/11/2023)   [name] of Occupational Health - Occupational Stress Questionnaire    Feeling of Stress : Not at all  Social Connections: Socially Integrated (01/24/2024)   Social Connection and Isolation Panel [NHANES]    Frequency of Communication with Friends and Family: More than three times a week    Frequency of Social Gatherings with Friends and Family: More than three times a week    Attends Religious Services: More than 4 times per year    Active Member of [employer] or Organizations: Yes    Attends Banker Meetings: More than 4 times per year    Marital Status: Married    Vitals:   01/28/24 1527  BP: 126/80  Pulse: 67  Resp: 16  SpO2: 97%   Body mass index is 24.86 kg/m.  Physical Exam Vitals and nursing note reviewed.  Constitutional:      General: He is not in acute distress.    Appearance: He is well-developed.  HENT:     Head: Normocephalic and atraumatic.     Mouth/Throat:     Pharynx: Uvula midline.  Eyes:     Conjunctiva/sclera: Conjunctivae normal.  Cardiovascular:     Rate and Rhythm: Normal rate and regular rhythm.     Heart sounds: No murmur heard. Pulmonary:     Effort: Pulmonary effort is normal. No respiratory distress.     Breath sounds: Normal breath sounds. No wheezing, rhonchi or  rales.  Abdominal:     Palpations: Abdomen is soft. There is no mass.     Tenderness: There is no abdominal tenderness.  Musculoskeletal:     Right lower leg: Pitting Edema present.     Left lower leg: 2+ Edema present.  Skin:    General: Skin is warm.     Findings: No erythema or rash.  Neurological:     Mental Status: He is alert and oriented to person, place, and time.     Cranial Nerves: No cranial nerve deficit.     Comments: Unstable gait assisted with a walker.  Psychiatric:        Mood and Affect: Mood and affect normal.   ASSESSMENT AND PLAN:  Mr. Haughn was seen today for hospital follow up for GI symptoms.   Lab Results  Component Value Date   NA 138 01/28/2024   CL 102 01/28/2024   K 4.8 01/28/2024   CO2 28 01/28/2024   BUN 22 01/28/2024   CREATININE 0.97 01/28/2024   GFR 72.21 01/28/2024   CALCIUM 8.9 01/28/2024   ALBUMIN 2.6 (L) 01/24/2024   GLUCOSE 72 01/28/2024   Lab Results  Component Value Date   WBC 6.0 01/28/2024   HGB 12.3 (L) 01/28/2024   HCT 35.9 (L) 01/28/2024   MCV 98.1 01/28/2024   PLT 197.0 01/28/2024   Mild intermittent asthma with exacerbation Assessment & Plan: With mild exacerbation for the past 5 days, reports improvement. Today I do not appreciate wheezing. Recommend short treatment with prednisone, which she has tolerated well in the past, we discussed some side effects. Prednisone 40 mg daily with breakfast for 3 days. Albuterol inh 2 puff every 6 hours for a week then as needed for wheezing or shortness of breath.  Continue Arnuity Elipta 1 puff daily. Instructed about warning signs. Further recommendation will be given according to chest x-ray result.  -CXR done on 01/23/2024 negative for opacities or signs of pneumonia. Abdomen/pelvis  CT done on 01/23/2024: Bronchial wall thickening in keeping with airway inflammation and peribronchial infiltrate within the right middle and lower lobes which may relate to changes of multifocal  infection or aspiration.  According to his wife, he was treated with antibiotics during hospitalization, I cannot find this information on discharge family. Further recommendation will be given according to chest x-ray result. Instructed about warning signs.  Orders: -     Albuterol Sulfate HFA; Inhale 2 puffs into the lungs 4 (four) times daily as needed for wheezing or shortness of breath.  Dispense: 8 g; Refill: 3 -     predniSONE; Take 2 tablets (40 mg total) by mouth daily with breakfast for 3 days.  Dispense: 6 tablet; Refill: 0 -     DG Chest 2 View; Future  Chronic heart failure with preserved ejection fraction (HCC) Assessment & Plan: Reporting 5 pounds weight gain in the past week and mildly worse LE edema, no orthopnea or PND. Continue monitoring weight. Today recommend torsemide 20 mg daily for 5 days. Continue low-salt diet. Further recommendation will be given according to BMP result.  Orders: -     Brain natriuretic peptide; Future -     Torsemide; Take 1 tablet (20 mg total) by mouth daily for 5 days.  Dispense: 5 tablet; Refill: 0  Essential hypertension Assessment & Plan: BP has been adequately controlled. Continue amlodipine 2.5 mg daily and low-salt diet. Continue monitoring BP regularly.  Orders: -     Basic metabolic panel; Future -     CBC; Future  Bilateral lower extremity edema Assessment & Plan: According to wife, mildly worse since hospital discharge. Recommend torsemide 20 mg daily for 5 days with high potassium diet. Lower extremity elevation and compression stockings recommended. Continue appropriate skin care.  Orders: -     Basic metabolic panel; Future -     Brain natriuretic peptide; Future -     Torsemide; Take 1 tablet (20 mg total) by mouth daily for 5 days.  Dispense: 5 tablet; Refill: 0  Anemia, unspecified type  Normocytic anemia for few years, otherwise stable. On chronic anticoagulation. Currently on Fe sulfate 325 mg daily.  I  spent a total of 56 minutes in both face to face and non face to face activities for this visit on the date of this encounter. During this time history was obtained and documented, examination was performed, prior labs/imaging reviewed, and assessment/plan discussed.  Return in about 4 weeks (around 02/25/2024), or if symptoms worsen or fail to improve, for keep next appointment.  I, Rolla Etienne Wierda, acting as a scribe for Dekker Verga [place], MD., have documented all relevant documentation on the behalf of Kamara Allan [place], MD, as directed by  Jamella Grayer [place], MD while in the presence of Shahan Starks [place], MD.   I, Lunabella Badgett [place], MD, have reviewed all documentation for this visit. The documentation on 01/28/24 for the exam, diagnosis, procedures, and orders are all accurate and complete.  Ericca Labra G. [place], MD  [facility]. [place] office.

## 2024-01-28 NOTE — Transitions of Care (Post Inpatient/ED Visit) (Signed)
   01/28/2024  Name: [name] MRN: [id number] DOB: [birth year]  Today's TOC FU Call Status: Today's TOC FU Call Status:: Unsuccessful Call (2nd Attempt) Unsuccessful Call (2nd Attempt) Date: 01/28/24  Attempted to reach the patient regarding the most recent Inpatient/ED visit.  Follow Up Plan: Additional outreach attempts will be made to reach the patient to complete the Transitions of Care (Post Inpatient/ED visit) call.   Lonia Chimera, RN, BSN, CEN [employer]- Transition of Care Team.  [facility] [phone]

## 2024-01-28 NOTE — Patient Instructions (Signed)
 A few things to remember from today's visit:  Essential hypertension - Plan: Basic metabolic panel, CBC  Mild intermittent asthma with exacerbation - Plan: albuterol (VENTOLIN HFA) 108 (90 Base) MCG/ACT inhaler, DG Chest 2 View  Chronic heart failure with preserved ejection fraction (HCC) - Plan: Brain Natriuretic Peptide  Bilateral lower extremity edema - Plan: Basic metabolic panel, Brain Natriuretic Peptide  Take Prednisone with breakfast. Albuterol inh 2 puff every 6 hours for a week then as needed for wheezing or shortness of breath.  Monitor for fever.  If you need refills for medications you take chronically, please call your pharmacy. Do not use My Chart to request refills or for acute issues that need immediate attention. If you send a my chart message, it may take a few days to be addressed, specially if I am not in the office.  Please be sure medication list is accurate. If a new problem present, please set up appointment sooner than planned today.

## 2024-01-28 NOTE — Assessment & Plan Note (Signed)
 Reporting 5 pounds weight gain in the past week and mildly worse LE edema, no orthopnea or PND. Continue monitoring weight. Today recommend torsemide 20 mg daily for 5 days. Continue low-salt diet. Further recommendation will be given according to BMP result.

## 2024-01-29 ENCOUNTER — Encounter: Payer: Self-pay | Admitting: Family Medicine

## 2024-01-29 ENCOUNTER — Telehealth: Payer: Self-pay

## 2024-01-29 LAB — BASIC METABOLIC PANEL
BUN: 22 mg/dL (ref 6–23)
CO2: 28 meq/L (ref 19–32)
Calcium: 8.9 mg/dL (ref 8.4–10.5)
Chloride: 102 meq/L (ref 96–112)
Creatinine, Ser: 0.97 mg/dL (ref 0.40–1.50)
GFR: 72.21 mL/min (ref >60.00)
Glucose, Bld: 72 mg/dL (ref 70–99)
Potassium: 4.8 meq/L (ref 3.5–5.1)
Sodium: 138 meq/L (ref 135–145)

## 2024-01-29 LAB — CBC
HCT: 35.9 % — ABNORMAL LOW (ref 39.0–52.0)
Hemoglobin: 12.3 g/dL — ABNORMAL LOW (ref 13.0–17.0)
MCHC: 34.3 g/dL (ref 30.0–36.0)
MCV: 98.1 fl (ref 78.0–100.0)
Platelets: 197 10*3/uL (ref 150.0–400.0)
RBC: 3.66 Mil/uL — ABNORMAL LOW (ref 4.22–5.81)
RDW: 13.7 % (ref 11.5–15.5)
WBC: 6 10*3/uL (ref 4.0–10.5)

## 2024-01-29 LAB — BRAIN NATRIURETIC PEPTIDE: Pro B Natriuretic peptide (BNP): 164 pg/mL — ABNORMAL HIGH (ref 0.0–100.0)

## 2024-01-29 NOTE — Transitions of Care (Post Inpatient/ED Visit) (Signed)
   01/29/2024  Name: [name] MRN: [id number] DOB: [birth year]  Today's TOC FU Call Status: Today's TOC FU Call Status:: Unsuccessful Call (3rd Attempt) Unsuccessful Call (3rd Attempt) Date: 01/29/24  Attempted to reach the patient regarding the most recent Inpatient/ED visit. Noted patient has hospital follow up with PCP yesterday.   Follow Up Plan: No further outreach attempts will be made at this time. We have been unable to contact the patient.  Lonia Chimera, RN, BSN, CEN [employer]- Transition of Care Team.  [facility] [phone]

## 2024-01-31 ENCOUNTER — Ambulatory Visit: Payer: Self-pay | Admitting: Family Medicine

## 2024-01-31 NOTE — Telephone Encounter (Signed)
  Chief Complaint: drop in BP Symptoms: drop in BP Frequency: this morning Pertinent Negatives: Patient denies sob, dizziness Disposition: [] ED /[] Urgent Care (no appt availability in office) / [] Appointment(In office/virtual)/ []  [facility]/ [] Home Care/ [] Refused Recommended Disposition /[] [facility]/ [x]  Follow-up with PCP Additional Notes: Patients wife called states that patient was started on Demadex 20mg  for 5 days and Prednisone 20mg  for 3 days. And took his first doses on yesterday and had a significant drop in his BP.  His systolic dropped from 149 to 119.  States that he has not taken a dose this morning as they are concerned it will drop his BP too low.  This morning is BP was 136/71 and his weight is 155lbs. Wife is asking for a call back to let them know wether to continue medications or not. She did states that his cough is better since starting the prednisone.     Copied from CRM [id number]. Topic: Clinical - Medication Question >> Jan 31, 2024  9:59 AM Theodis Sato wrote: Reason for CRM: Patients wife is concerned about the patients blood pressure dropping from 149 to 119 in one night after starting torsemide (DEMADEX) 20 MG tablet and predniSONE (DELTASONE) 20 MG tablet Please call patients wife back as she is worried this is to significant of a drop in blood pressure. Reason for Disposition  [1] Caller has URGENT medicine question about med that PCP or specialist prescribed AND [2] triager unable to answer question  Answer Assessment - Initial Assessment Questions 1. NAME of MEDICINE: "What medicine(s) are you calling about?"     Toresmide 20mg  once day 2. QUESTION: "What is your question?" (e.g., double dose of medicine, side effect)     Dropped BP from 149 to 119 after first dose 3. PRESCRIBER: "Who prescribed the medicine?" Reason: if prescribed by specialist, call should be referred to that group.     Dr. B [place] 4. SYMPTOMS: "Do you have any  symptoms?" If Yes, ask: "What symptoms are you having?"  "How bad are the symptoms (e.g., mild, moderate, severe)     No symptoms, cough has gotten better.  Protocols used: Medication Question Call-A-AH

## 2024-02-01 NOTE — Telephone Encounter (Signed)
 If asymptomatic, decrease Torsemide to 1/2 tab to complete 5-7 days, it not tolerated  he can discontinue it.  Continue Prednisone for now. Continue monitoring BP. Thanks, BJ

## 2024-02-01 NOTE — Telephone Encounter (Signed)
 I called and spoke with pt's wife. She is aware of message below & verbalized understanding.

## 2024-02-05 NOTE — Progress Notes (Signed)
 Remote pacemaker transmission.

## 2024-02-05 NOTE — Addendum Note (Signed)
 Addended by: Geralyn Flash D on: 02/05/2024 10:06 AM   Modules accepted: Orders

## 2024-02-12 LAB — MISCELLANEOUS TEST

## 2024-02-15 ENCOUNTER — Encounter: Payer: Self-pay | Admitting: Family Medicine

## 2024-02-28 ENCOUNTER — Ambulatory Visit: Payer: Self-pay

## 2024-02-28 ENCOUNTER — Telehealth: Payer: Self-pay | Admitting: Neurology

## 2024-02-28 NOTE — Telephone Encounter (Signed)
 Copied from CRM [id number]. Topic: Clinical - Red Word Triage >> Feb 28, 2024  9:38 AM Luane Rumps D wrote: Red Word that prompted transfer to Nurse Triage: Speech off, confusion, wife concerned with several past TIA   Chief Complaint: Neurologic deficit  Symptoms: Changes to speech, increased confusion  Frequency: Constant  Disposition: [x] ED /[] Urgent Care (no appt availability in office) / [] Appointment(In office/virtual)/ []  [facility]/ [] Home Care/ [] Refused Recommended Disposition /[] [facility]/ []  Follow-up with PCP Additional Notes: The patient's wife called to report that the patient woke up this morning with increased confusion and changes to his speech. She states that is is able to walk without difficulty. She states his last known well was last night. I have advised her that the patient should be taken to the ED for evaluation of his symptoms. She verbalized understanding and agreement of this plan.     Reason for Disposition  [1] Loss of speech or garbled speech AND [2] sudden onset AND [3] present now  Answer Assessment - Initial Assessment Questions 1. SYMPTOM: "What is the main symptom you are concerned about?" (e.g., weakness, numbness)     Increased confusion  2. ONSET: "When did this start?" (minutes, hours, days; while sleeping)     This morning  3. LAST NORMAL: "When was the last time you (the patient) were normal (no symptoms)?"     Last night  4. PATTERN "Does this come and go, or has it been constant since it started?"  "Is it present now?"     Constant  5. CARDIAC SYMPTOMS: "Have you had any of the following symptoms: chest pain, difficulty breathing, palpitations?"     No 6. NEUROLOGIC SYMPTOMS: "Have you had any of the following symptoms: headache, dizziness, vision loss, double vision, changes in speech, unsteady on your feet?"     Changes to speech  7. OTHER SYMPTOMS: "Do you have any other symptoms?"     No  Protocols used: Neurologic  Deficit-A-AH

## 2024-02-28 NOTE — Telephone Encounter (Signed)
 Spoke w/Pt and wife regarding symptoms Pt had this AM. Wife stated the episode lasted about 40 minutes. Pt was able to talk and walk with walker this morning. Wife said his speech was clear but what he was saying was disconnected and not making sense. Pt stated he did not seem to have weakness and was able to get up and toilet himself and he ate breakfast without problems. Informed Pt and wife that when Pt has episodes that cause him to not be able to communicate or speak at all or has weakness of any kind to call 911 so he can be evaluated. Pt and wife stated this was a little different than when he has had TIAs in the past. Recommended they keep a record or diary of sorts with date, time, and symptoms. But they were strongly encouraged not to wait and call for EMS if he has the symptoms we discussed. Pt and wife stated understanding and thankful for the call.

## 2024-02-28 NOTE — Telephone Encounter (Signed)
 Pt 's wife has called to report that early this morning pt was displaying symptoms of a TIA, she reached out to pt's PCP and was advised to take pt to ED.  Wife states while getting ready to go to ED, pt came back to himself, wife states now completely normal and pt is even wanting to sit down to breakfast.  Wife has not followed back up with PCP to advise, she is asking what Dr Janett Medin would suggest.  Phone rep advised she should take pt to ED to be evaluated, wife would like a call from RN to discuss, message being sent as a high priority.

## 2024-03-03 NOTE — Telephone Encounter (Signed)
 See other phone note from neurology.

## 2024-03-07 ENCOUNTER — Encounter: Payer: Self-pay | Admitting: Family Medicine

## 2024-03-07 ENCOUNTER — Ambulatory Visit (INDEPENDENT_AMBULATORY_CARE_PROVIDER_SITE_OTHER): Admitting: Family Medicine

## 2024-03-07 VITALS — BP 140/80 | HR 82 | Temp 97.7°F | Resp 16 | Ht 66.0 in | Wt 159.8 lb

## 2024-03-07 DIAGNOSIS — K219 Gastro-esophageal reflux disease without esophagitis: Secondary | ICD-10-CM

## 2024-03-07 DIAGNOSIS — G459 Transient cerebral ischemic attack, unspecified: Secondary | ICD-10-CM

## 2024-03-07 DIAGNOSIS — I10 Essential (primary) hypertension: Secondary | ICD-10-CM

## 2024-03-07 DIAGNOSIS — R2681 Unsteadiness on feet: Secondary | ICD-10-CM

## 2024-03-07 DIAGNOSIS — J4520 Mild intermittent asthma, uncomplicated: Secondary | ICD-10-CM | POA: Diagnosis not present

## 2024-03-07 DIAGNOSIS — R1310 Dysphagia, unspecified: Secondary | ICD-10-CM

## 2024-03-07 DIAGNOSIS — R600 Localized edema: Secondary | ICD-10-CM

## 2024-03-07 MED ORDER — OMEPRAZOLE 40 MG PO CPDR: 40.0000 mg | DELAYED_RELEASE_CAPSULE | Freq: Every day | ORAL | Status: DC

## 2024-03-07 NOTE — Assessment & Plan Note (Signed)
 Chronic. Most likely related with venous insufficiency. We discussed treatment options, he did not tolerate diuretic in 01/2024.  We discussed son side effects. Lower extremity elevation above heart level and compression stockings recommended. Continue adequate skin care.

## 2024-03-07 NOTE — Progress Notes (Signed)
 HPI: [name] is a 83 y.o. male with a PMHx significant for TIA, PAF, HTN, HFpEF, asthma, GERD, peripheral neuropathy, and prediabetes, among some, who is here today with his wife for chronic disease management.  Last seen on 01/28/2024 Last visit he was concerned about sudden weight gain, which was attributed to fluid retention.  He was recommended to take torsemide  20 mg daily for 5 days and then as needed.  He later called reporting episodes of hypotension while on diuretic, so it was discontinued. Lower extremity edema has been persistent. In the past he wore compression stockings but they were too tight, did not tolerate well. He has not noted ulcers or vesicular lesions. He seems to be worse at the end of the day. Negative orthopnea and PND.  HFpEF: last echo 03/2021 with LVEF 60 to 65%, diastolic parameters are indeterminate.  GERD:He has some concerns about hiatal hernia that has been reported over recent imaging. For years she has been on omeprazole  40 mg daily, which he takes around 4 PM.  Today he complains of occasional chest tightness right after swallowing food, problem has been going on for about a year. He says he has the problem especially when eating rice or noodles. He feels like something sits in the middle of his chest and does not want to swallow.  He is also aggravated by ice water. The sensation is improved by drinking warm liquids or burping.  Also endorses some recent acid reflux, acidic feeling in his throat.  He believes it may be related to a hiatal hernia.  Last endoscopy was more than 5 years ago. At that time, and according to patient, he was told he needed to be on PPI for the rest of his life.  He is not aware of Barrett esophagus changes. Denies heartburn.  Asthma:  Last visit he reported mild exacerbation. Problem is back to baseline, asymptomatic as far as she uses Arnuity Ellipta  200 mcg 1 puff daily. Negative for cough or wheezing.  His wife  says he is breathing through his mouth more than usual.  He reports nasal congestion.  Balance issues:  He is wife is concerned about his problems with balance, in the past he has had a few falls.  Currently he is on PT. She wonders if any of his medication is aggravating problem. He uses a walker to assist with transfers.  Hypertension:  Medications: Currently on amlodipine  2.5 mg daily.  BP readings at home: His blood pressure readings at home have been in the 120s/70s.  Negative for unusual or severe headache, visual changes, exertional chest pain, dyspnea, or focal weakness.  Lab Results  Component Value Date   CREATININE 0.97 01/28/2024   BUN 22 01/28/2024   NA 138 01/28/2024   K 4.8 01/28/2024   CL 102 01/28/2024   CO2 28 01/28/2024   Hyperlipidemia: He also has some questions about atorvastatin  40 mg, which was started around the same time that omeprazole  was.  He wonders if he needs to continue. History of TIAs and PAD. Currently on atorvastatin  40 mg daily.  In general he has tolerated medication well, he has not noticed side effects.  Lab Results  Component Value Date   CHOL 90 08/29/2023   HDL 40.00 08/29/2023   LDLCALC 29 08/29/2023   TRIG 104.0 08/29/2023   CHOLHDL 2 08/29/2023   Review of Systems  Constitutional:  Negative for activity change, appetite change and fever.  HENT:  Positive for hearing loss (  Chronic, wears hearing aids). Negative for mouth sores and sore throat.   Gastrointestinal:  Negative for abdominal pain, nausea and vomiting.  Genitourinary:  Negative for decreased urine volume, dysuria and hematuria.  Musculoskeletal:  Positive for arthralgias, back pain and gait problem.  Skin:  Negative for rash.  Neurological:  Negative for syncope, facial asymmetry and headaches.  Psychiatric/Behavioral:  Negative for confusion and hallucinations.   See other pertinent positives and negatives in HPI.  Current Outpatient Medications on File Prior to  Visit  Medication Sig Dispense Refill   acetaminophen  (TYLENOL ) 325 MG tablet Take 2 tablets (650 mg total) by mouth every 6 (six) hours as needed. (Patient taking differently: Take 650 mg by mouth every 6 (six) hours as needed for mild pain (pain score 1-3), moderate pain (pain score 4-6), headache or fever.) 36 tablet 0   albuterol  (VENTOLIN  HFA) 108 (90 Base) MCG/ACT inhaler Inhale 2 puffs into the lungs 4 (four) times daily as needed for wheezing or shortness of breath. 8 g 3   amLODipine  (NORVASC ) 2.5 MG tablet TAKE 1 TABLET BY MOUTH EVERY DAY 90 tablet 1   ARNUITY ELLIPTA  200 MCG/ACT AEPB INHALE 1 PUFF INTO THE LUNGS EVERY MORNING. 90 each 3   atorvastatin  (LIPITOR) 40 MG tablet Take 1 tablet (40 mg total) by mouth daily. 90 tablet 2   Calcium  250 MG CAPS Take 1 capsule by mouth daily.     cholecalciferol  (VITAMIN D ) 25 MCG (1000 UNIT) tablet Take 1,000 Units by mouth at bedtime.     docusate sodium  (COLACE) 50 MG capsule Take 100 mg by mouth 2 (two) times daily.     ELIQUIS  5 MG TABS tablet TAKE 1 TABLET BY MOUTH TWICE A DAY 180 tablet 1   Ferrous Sulfate  (IRON) 325 (65 Fe) MG TABS Take 1 tablet by mouth at bedtime.     Magnesium  250 MG TABS Take 250 mg by mouth every morning.     Multiple Vitamin (MULTIVITAMIN WITH MINERALS) TABS tablet Take 1 tablet by mouth every morning.     Omega-3 Fatty Acids (FISH OIL PO) Take 1 capsule by mouth every morning.     TIMOLOL  MALEATE OP Place 1 drop into both eyes in the morning. 1 drop into both eyes every morning     No current facility-administered medications on file prior to visit.   Past Medical History:  Diagnosis Date   Carotid artery occlusion    GERD (gastroesophageal reflux disease)    Hypertension    Stroke (HCC) 02/2021   TIA   No Known Allergies  Social History   Socioeconomic History   Marital status: Married    Spouse name: Garment/textile technologist   Number of children: Not on file   Years of education: Not on file   Highest education level:  Associate degree: academic program  Occupational History   Not on file  Tobacco Use   Smoking status: Former    Passive exposure: Never   Smokeless tobacco: Never  Vaping Use   Vaping status: Never Used  Substance and Sexual Activity   Alcohol use: Not Currently    Comment: occassional   Drug use: Never   Sexual activity: Not Currently  Other Topics Concern   Not on file  Social History Narrative   Lives with wife   Social Drivers of Health   Financial Resource Strain: Low Risk  (05/11/2023)   Overall Financial Resource Strain (CARDIA)    Difficulty of Paying Living Expenses: Not hard at all  Food Insecurity: No Food Insecurity (01/24/2024)   Hunger Vital Sign    Worried About Running Out of Food in the Last Year: Never true    Ran Out of Food in the Last Year: Never true  Transportation Needs: No Transportation Needs (01/24/2024)   PRAPARE - Administrator, Civil Service (Medical): No    Lack of Transportation (Non-Medical): No  Physical Activity: Sufficiently Active (05/11/2023)   Exercise Vital Sign    Days of Exercise per Week: 5 days    Minutes of Exercise per Session: 30 min  Recent Concern: Physical Activity - Insufficiently Active (05/11/2023)   Exercise Vital Sign    Days of Exercise per Week: 2 days    Minutes of Exercise per Session: 30 min  Stress: No Stress Concern Present (05/11/2023)   [name] of Occupational Health - Occupational Stress Questionnaire    Feeling of Stress : Not at all  Social Connections: Socially Integrated (01/24/2024)   Social Connection and Isolation Panel [NHANES]    Frequency of Communication with Friends and Family: More than three times a week    Frequency of Social Gatherings with Friends and Family: More than three times a week    Attends Religious Services: More than 4 times per year    Active Member of [employer] or Organizations: Yes    Attends Banker Meetings: More than 4 times per year    Marital  Status: Married   Vitals:   03/07/24 1538  BP: (!) 140/80  Pulse: 82  Resp: 16  Temp: 97.7 F (36.5 C)  SpO2: 96%   Body mass index is 25.79 kg/m.  Physical Exam Vitals and nursing note reviewed.  Constitutional:      General: He is not in acute distress.    Appearance: He is well-developed.  HENT:     Head: Normocephalic and atraumatic.     Mouth/Throat:     Mouth: Mucous membranes are moist.     Pharynx: Oropharynx is clear. Uvula midline.  Eyes:     Conjunctiva/sclera: Conjunctivae normal.  Cardiovascular:     Rate and Rhythm: Normal rate and regular rhythm.     Heart sounds: No murmur heard.    Comments: DP pulses palpable. Pulmonary:     Effort: Pulmonary effort is normal. No respiratory distress.     Breath sounds: Normal breath sounds.  Abdominal:     Palpations: Abdomen is soft. There is no mass.     Tenderness: There is no abdominal tenderness.  Musculoskeletal:     Right lower leg: 2+ Pitting Edema present.     Left lower leg: 2+ Pitting Edema present.  Lymphadenopathy:     Cervical: No cervical adenopathy.  Skin:    General: Skin is warm.     Findings: No erythema or rash.  Neurological:     General: No focal deficit present.     Mental Status: He is alert and oriented to person, place, and time.     Comments: Stable, antalgic gait assisted with a walker.  Psychiatric:        Mood and Affect: Mood and affect normal.   ASSESSMENT AND PLAN:  Mr. Geimer was seen today for chronic disease management.   Orders Placed This Encounter  Procedures   Ambulatory referral to Gastroenterology   Dysphagia, unspecified type History difficult to obtain but episodes of chest discomfort right after swallowing seem to indicate dysphagia. Upon reviewing records after visit noted that he has been evaluated  for dysphagia by GI in 10/2018, when he underwent EGD: 4 cm hiatal hernia, Cameron's erosions associated with hiatal hernia, Schatzki ring found at the  gastroesophageal junction.  Status post balloon esophageal dilation. Instructed to change omeprazole  40 mg to morning time, 30-minute before breakfast. Instructed about warning signs. GI referral placed.  -     Ambulatory referral to Gastroenterology  Essential hypertension Assessment & Plan: SBP mildly elevated today, reporting lower BPs at home. Continue amlodipine  2.5 mg daily. Low salt also recommended. Continue monitoring BP regularly.  Gastroesophageal reflux disease, unspecified whether esophagitis present Assessment & Plan: Reporting some episodes of acid reflux. Before changing to a different PPI, recommend trying to take omeprazole  40 mg 30 minutes before breakfast. He was instructed to let me know if symptoms have improved in about 3 to 4 weeks, if not we can consider changing to pantoprazole . GERD precautions recommended.  Orders: -     Omeprazole ; Take 1 capsule (40 mg total) by mouth daily before breakfast.  Bilateral lower extremity edema Assessment & Plan: Chronic. Most likely related with venous insufficiency. We discussed treatment options, he did not tolerate diuretic in 01/2024.  We discussed son side effects. Lower extremity elevation above heart level and compression stockings recommended. Continue adequate skin care.  Unstable gait Assessment & Plan: His wife has some questions about this problem, which seems gradually getting worse. Some of his chronic medical conditions as well as medications could be aggravating problem. Currently he is doing PT. Fall precautions to continue.  TIA (transient ischemic attack) Assessment & Plan: Today he has some questions about atorvastatin  40 mg, which he is taking daily. Given his history of PAD and TIAs, recommend continuing medication. Last LDL was 29 in 08/2023.  Asthma in adult, mild intermittent, uncomplicated Assessment & Plan: Problem is well-controlled. Continue Arnuity Ellipta  200 mcg 1 puff daily and  albuterol  inhaler 1 to 2 puff 4 times daily as needed.  I spent a total of 43 minutes in both face to face and non face to face activities for this visit on the date of this encounter. During this time history was obtained and documented, examination was performed, prior labs/imaging reviewed, and assessment/plan discussed.  Return if symptoms worsen or fail to improve, for keep next appointment.  I, Fritz Jewel Wierda, acting as a scribe for Shanieka Blea [place], MD., have documented all relevant documentation on the behalf of Cameron Incorvaia [place], MD, as directed by  Vernesha Talbot [place], MD while in the presence of Carlyann Placide [place], MD.   I, Solae Norling [place], MD, have reviewed all documentation for this visit. The documentation on 03/07/24 for the exam, diagnosis, procedures, and orders are all accurate and complete.  Dafney Farler G. [place], MD  [facility]. [place] office.

## 2024-03-07 NOTE — Assessment & Plan Note (Addendum)
 SBP mildly elevated today, reporting lower BPs at home. Continue amlodipine  2.5 mg daily. Continue monitoring BP regularly.

## 2024-03-07 NOTE — Assessment & Plan Note (Signed)
 Today he has some questions about atorvastatin  40 mg, which he is taking daily. Given his history of PAD and TIAs, recommend continuing medication. Last LDL was 29 in 08/2023.

## 2024-03-07 NOTE — Assessment & Plan Note (Signed)
 His wife has some questions about this problem, which seems gradually getting worse. Some of his chronic medical conditions as well as medications could be aggravating problem. Currently he is on PT. Fall precautions to continue.

## 2024-03-07 NOTE — Patient Instructions (Addendum)
 A few things to remember from today's visit:  Dysphagia, unspecified type  Essential hypertension  Gastroesophageal reflux disease, unspecified whether esophagitis present  Bilateral lower extremity edema  Unstable gait Try taking Omeprazole  30 min before breakfast and let me know in 3-4 weeks oif it helps. Compression stocking for leg edema, 25 mmHg pressure at least.  If you need refills for medications you take chronically, please call your pharmacy. Do not use My Chart to request refills or for acute issues that need immediate attention. If you send a my chart message, it may take a few days to be addressed, specially if I am not in the office.  Please be sure medication list is accurate. If a new problem present, please set up appointment sooner than planned today.

## 2024-03-07 NOTE — Assessment & Plan Note (Signed)
 Reporting some episodes of acid reflux. Before changing to a different PPI, recommend trying to take omeprazole  40 mg 30 minutes before breakfast. He was instructed to let me know if symptoms have improved in about 3 to 4 weeks, if not we can consider changing to pantoprazole . GERD precautions recommended.

## 2024-03-07 NOTE — Assessment & Plan Note (Signed)
 Problem is well-controlled. Continue Arnuity Ellipta  200 mcg 1 puff daily and albuterol  inhaler 1 to 2 puff 4 times daily as needed.

## 2024-03-20 ENCOUNTER — Emergency Department (HOSPITAL_BASED_OUTPATIENT_CLINIC_OR_DEPARTMENT_OTHER)

## 2024-03-20 ENCOUNTER — Other Ambulatory Visit: Payer: Self-pay

## 2024-03-20 ENCOUNTER — Encounter (HOSPITAL_BASED_OUTPATIENT_CLINIC_OR_DEPARTMENT_OTHER): Payer: Self-pay

## 2024-03-20 ENCOUNTER — Emergency Department (HOSPITAL_BASED_OUTPATIENT_CLINIC_OR_DEPARTMENT_OTHER)
Admission: EM | Admit: 2024-03-20 | Discharge: 2024-03-20 | Disposition: A | Discharge status: Home / Self Care | Attending: Emergency Medicine | Admitting: Emergency Medicine

## 2024-03-20 DIAGNOSIS — R479 Unspecified speech disturbances: Secondary | ICD-10-CM | POA: Diagnosis present

## 2024-03-20 DIAGNOSIS — Z7901 Long term (current) use of anticoagulants: Secondary | ICD-10-CM | POA: Diagnosis not present

## 2024-03-20 DIAGNOSIS — R519 Headache, unspecified: Secondary | ICD-10-CM | POA: Insufficient documentation

## 2024-03-20 DIAGNOSIS — Z8673 Personal history of transient ischemic attack (TIA), and cerebral infarction without residual deficits: Secondary | ICD-10-CM | POA: Insufficient documentation

## 2024-03-20 LAB — CBC
HCT: 34.6 % — ABNORMAL LOW (ref 39.0–52.0)
Hemoglobin: 12 g/dL — ABNORMAL LOW (ref 13.0–17.0)
MCH: 32.9 pg (ref 26.0–34.0)
MCHC: 34.7 g/dL (ref 30.0–36.0)
MCV: 94.8 fL (ref 80.0–100.0)
Platelets: 178 10*3/uL (ref 150–400)
RBC: 3.65 MIL/uL — ABNORMAL LOW (ref 4.22–5.81)
RDW: 13.5 % (ref 11.5–15.5)
WBC: 6.5 10*3/uL (ref 4.0–10.5)
nRBC: 0 % (ref 0.0–0.2)

## 2024-03-20 LAB — COMPREHENSIVE METABOLIC PANEL WITH GFR
ALT: 27 U/L (ref 0–44)
AST: 27 U/L (ref 15–41)
Albumin: 4.2 g/dL (ref 3.5–5.0)
Alkaline Phosphatase: 122 U/L (ref 38–126)
Anion gap: 10 (ref 5–15)
BUN: 26 mg/dL — ABNORMAL HIGH (ref 8–23)
CO2: 24 mmol/L (ref 22–32)
Calcium: 9.2 mg/dL (ref 8.9–10.3)
Chloride: 104 mmol/L (ref 98–111)
Creatinine, Ser: 1.18 mg/dL (ref 0.61–1.24)
GFR, Estimated: >60 mL/min (ref >60)
Glucose, Bld: 99 mg/dL (ref 70–99)
Potassium: 5 mmol/L (ref 3.5–5.1)
Sodium: 138 mmol/L (ref 135–145)
Total Bilirubin: 0.5 mg/dL (ref 0.0–1.2)
Total Protein: 6.1 g/dL — ABNORMAL LOW (ref 6.5–8.1)

## 2024-03-20 LAB — CBG MONITORING, ED: Glucose-Capillary: 109 mg/dL — ABNORMAL HIGH (ref 70–99)

## 2024-03-20 NOTE — Discharge Instructions (Signed)
 Follow-up with your doctor next week if you have any further symptoms

## 2024-03-20 NOTE — ED Triage Notes (Addendum)
 Arrives POV with complaints of waking up fatigued this morning, and having slurred/delayed speech earlier today around 1400 while at lunch. Also reports mild headache.   Hx of TIA

## 2024-03-20 NOTE — ED Provider Notes (Signed)
 [place] EMERGENCY DEPARTMENT AT [facility] Provider Note   CSN: [id number] Arrival date & time: 03/20/24  1708     History  Chief Complaint  Patient presents with   Slurred Speech   Headache    [name] is a 83 y.o. male.  83 year old male who presents with sudden onset of having trouble getting his words out.  Patient states that this started about 3 and half hours ago and he says that he has to concentrate to form words.  His wife who is at bedside states that his speech did not sound slurred.  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  He has not had any gait ataxia, no new weakness in his arms or legs.  No headache.  No emesis.  No visual changes.  No chest pain or shortness of breath.  States that symptoms are continue to wax and wane.  States that when he had tears for the past they were associated with losing balance which he has not done today.  Denies any new medications.       Home Medications Prior to Admission medications   Medication Sig Start Date End Date Taking? Authorizing Provider  acetaminophen  (TYLENOL ) 325 MG tablet Take 2 tablets (650 mg total) by mouth every 6 (six) hours as needed. Patient taking differently: Take 650 mg by mouth every 6 (six) hours as needed for mild pain (pain score 1-3), moderate pain (pain score 4-6), headache or fever. 03/04/22   Teddi Favors, DO  albuterol  (VENTOLIN  HFA) 108 (90 Base) MCG/ACT inhaler Inhale 2 puffs into the lungs 4 (four) times daily as needed for wheezing or shortness of breath. 01/28/24   [place], Betty G, MD  amLODipine  (NORVASC ) 2.5 MG tablet TAKE 1 TABLET BY MOUTH EVERY DAY 07/20/23   [place], Betty G, MD  ARNUITY ELLIPTA  200 MCG/ACT AEPB INHALE 1 PUFF INTO THE LUNGS EVERY MORNING. 05/23/23   [place], Betty G, MD  atorvastatin  (LIPITOR) 40 MG tablet Take 1 tablet (40 mg total) by mouth daily. 07/27/23   [place], Betty G, MD  Calcium  250 MG CAPS Take 1 capsule  by mouth daily.    [provider]  cholecalciferol  (VITAMIN D ) 25 MCG (1000 UNIT) tablet Take 1,000 Units by mouth at bedtime.    [provider]  docusate sodium  (COLACE) 50 MG capsule Take 100 mg by mouth 2 (two) times daily.    [provider]  ELIQUIS  5 MG TABS tablet TAKE 1 TABLET BY MOUTH TWICE A DAY 01/15/24   [place], Betty G, MD  Ferrous Sulfate  (IRON) 325 (65 Fe) MG TABS Take 1 tablet by mouth at bedtime.    [provider]  Magnesium  250 MG TABS Take 250 mg by mouth every morning.    [provider]  Multiple Vitamin (MULTIVITAMIN WITH MINERALS) TABS tablet Take 1 tablet by mouth every morning.    [provider]  Omega-3 Fatty Acids (FISH OIL PO) Take 1 capsule by mouth every morning.    [provider]  omeprazole  (PRILOSEC) 40 MG capsule Take 1 capsule (40 mg total) by mouth daily before breakfast. 03/07/24   [place], Betty G, MD  TIMOLOL  MALEATE OP Place 1 drop into both eyes in the morning. 1 drop into both eyes every morning    [provider]      Allergies    Patient has no known allergies.    Review  of Systems   Review of Systems  All other systems reviewed and are negative.   Physical Exam Updated Vital Signs BP (!) 147/70 (BP Location: Right Arm)   Pulse 61   Temp 98.3 F (36.8 C) (Oral)   Resp 20   Ht 1.676 m (5\' 6" )   Wt 73 kg   SpO2 99%   BMI 25.97 kg/m  Physical Exam Vitals and nursing note reviewed.  Constitutional:      General: He is not in acute distress.    Appearance: Normal appearance. He is well-developed. He is not toxic-appearing.  HENT:     Head: Normocephalic and atraumatic.  Eyes:     General: Lids are normal.     Conjunctiva/sclera: Conjunctivae normal.     Pupils: Pupils are equal, round, and reactive to light.  Neck:     Thyroid: No thyroid mass.     Trachea: No tracheal deviation.  Cardiovascular:     Rate and Rhythm: Normal rate and regular rhythm.     Heart  sounds: Normal heart sounds. No murmur heard.    No gallop.  Pulmonary:     Effort: Pulmonary effort is normal. No respiratory distress.     Breath sounds: Normal breath sounds. No stridor. No decreased breath sounds, wheezing, rhonchi or rales.  Abdominal:     General: There is no distension.     Palpations: Abdomen is soft.     Tenderness: There is no abdominal tenderness. There is no rebound.  Musculoskeletal:        General: No tenderness. Normal range of motion.     Cervical back: Normal range of motion and neck supple.  Skin:    General: Skin is warm and dry.     Findings: No abrasion or rash.  Neurological:     Mental Status: He is alert and oriented to person, place, and time. Mental status is at baseline.     GCS: GCS eye subscore is 4. GCS verbal subscore is 5. GCS motor subscore is 6.     Cranial Nerves: No cranial nerve deficit.     Sensory: No sensory deficit.     Motor: Motor function is intact.     Comments: Speech is normal.  No dysarthria appreciated.  No aphasia  Psychiatric:        Attention and Perception: Attention normal.        Speech: Speech normal.        Behavior: Behavior normal.     ED Results / Procedures / Treatments   Labs (all labs ordered are listed, but only abnormal results are displayed) Labs Reviewed  CBG MONITORING, ED - Abnormal; Notable for the following components:      Result Value   Glucose-Capillary 109 (*)    All other components within normal limits  COMPREHENSIVE METABOLIC PANEL WITH GFR  CBC  CBG MONITORING, ED    EKG EKG Interpretation Date/Time:  Thursday Mar 20 2024 17:24:21 EDT Ventricular Rate:  63 PR Interval:  145 QRS Duration:  103 QT Interval:  393 QTC Calculation: 403 R Axis:   23  Text Interpretation: Sinus rhythm Minimal ST depression, lateral leads Minimal ST elevation, inferior leads No significant change since last tracing Confirmed by Lind Repine ([id number]) on 03/20/2024 5:46:51 PM  Radiology No  results found.  Procedures Procedures    Medications Ordered in ED Medications - No data to display  ED Course/ Medical Decision Making/ A&P  Medical Decision Making Amount and/or Complexity of Data Reviewed Labs: ordered. Radiology: ordered.   Head CT without acute findings here.  EKG shows a sinus rhythm.  Labs are reassuring.  Patient has baseline at this time.  Do not feel that he needs any further workup at this time.  Low suspicion that this was a TIA.  Patient notes symptoms, wax and wane labs for few minutes to seconds.  Will discharge home and he will follow-up with Dr.        Final Clinical Impression(s) / ED Diagnoses Final diagnoses:  None    Rx / DC Orders ED Discharge Orders     None         Lind Repine, MD 03/20/24 [phone]

## 2024-03-22 ENCOUNTER — Other Ambulatory Visit: Payer: Self-pay | Admitting: Family Medicine

## 2024-03-22 DIAGNOSIS — I10 Essential (primary) hypertension: Secondary | ICD-10-CM

## 2024-03-24 ENCOUNTER — Telehealth: Payer: Self-pay

## 2024-03-24 NOTE — Transitions of Care (Post Inpatient/ED Visit) (Unsigned)
   03/24/2024  Name: [name] MRN: [id number] DOB: 1941-05-08  Today's TOC FU Call Status: Today's TOC FU Call Status:: Unsuccessful Call (1st Attempt) Unsuccessful Call (1st Attempt) Date: 03/24/24  Attempted to reach the patient regarding the most recent Inpatient/ED visit.  Follow Up Plan: Additional outreach attempts will be made to reach the patient to complete the Transitions of Care (Post Inpatient/ED visit) call.   Signature Darrall Ellison, LPN [facility] Nurse Health Advisor Direct Dial [phone]

## 2024-03-25 ENCOUNTER — Other Ambulatory Visit: Payer: Self-pay | Admitting: Family Medicine

## 2024-03-25 ENCOUNTER — Ambulatory Visit (INDEPENDENT_AMBULATORY_CARE_PROVIDER_SITE_OTHER): Payer: Medicare Other

## 2024-03-25 DIAGNOSIS — G459 Transient cerebral ischemic attack, unspecified: Secondary | ICD-10-CM

## 2024-03-25 LAB — CUP PACEART REMOTE DEVICE CHECK
Battery Remaining Longevity: 25 mo
Battery Remaining Percentage: 20 %
Battery Voltage: 2.89 V
Brady Statistic AP VP Percent: 1.8 %
Brady Statistic AP VS Percent: 95 %
Brady Statistic AS VP Percent: 1 %
Brady Statistic AS VS Percent: 2.8 %
Brady Statistic RA Percent Paced: 96 %
Brady Statistic RV Percent Paced: 1.8 %
Date Time Interrogation Session: 20250513020014
Implantable Lead Connection Status: 753985
Implantable Lead Connection Status: 753985
Implantable Lead Implant Date: 20160928
Implantable Lead Implant Date: 20160928
Implantable Lead Location: 753859
Implantable Lead Location: 753860
Implantable Lead Model: 1948
Implantable Pulse Generator Implant Date: 20160928
Lead Channel Impedance Value: 390 Ohm
Lead Channel Impedance Value: 530 Ohm
Lead Channel Pacing Threshold Amplitude: 0.75 V
Lead Channel Pacing Threshold Amplitude: 1 V
Lead Channel Pacing Threshold Pulse Width: 0.4 ms
Lead Channel Pacing Threshold Pulse Width: 0.4 ms
Lead Channel Sensing Intrinsic Amplitude: 1.4 mV
Lead Channel Sensing Intrinsic Amplitude: 2.3 mV
Lead Channel Setting Pacing Amplitude: 1.25 V
Lead Channel Setting Pacing Amplitude: 1.75 V
Lead Channel Setting Pacing Pulse Width: 0.4 ms
Lead Channel Setting Sensing Sensitivity: 0.7 mV
Pulse Gen Model: 2240
Pulse Gen Serial Number: 7817736

## 2024-03-25 NOTE — Transitions of Care (Post Inpatient/ED Visit) (Unsigned)
   03/25/2024  Name: [name] MRN: [id number] DOB: 04-21-1941  Today's TOC FU Call Status: Today's TOC FU Call Status:: Unsuccessful Call (2nd Attempt) Unsuccessful Call (1st Attempt) Date: 03/24/24 Unsuccessful Call (2nd Attempt) Date: 03/25/24  Attempted to reach the patient regarding the most recent Inpatient/ED visit.  Follow Up Plan: Additional outreach attempts will be made to reach the patient to complete the Transitions of Care (Post Inpatient/ED visit) call.   Signature Darrall Ellison, LPN [facility] Nurse Health Advisor Direct Dial [phone]

## 2024-03-26 NOTE — Transitions of Care (Post Inpatient/ED Visit) (Signed)
 03/26/2024  Name: [name] MRN: [id number] DOB: 04-Aug-1941  Today's TOC FU Call Status: Today's TOC FU Call Status:: Successful TOC FU Call Completed Unsuccessful Call (1st Attempt) Date: 03/24/24 Unsuccessful Call (2nd Attempt) Date: 03/25/24 [facility] FU Call Complete Date: 03/26/24 Patient's Name and Date of Birth confirmed.  Transition Care Management Follow-up Telephone Call Date of Discharge: 03/20/24 Discharge Facility: [facility] ([facility]) Type of Discharge: Emergency Department Reason for ED Visit: Other: (speech disturbance) How have you been since you were released from the hospital?: Better Any questions or concerns?: No  Items Reviewed: Did you receive and understand the discharge instructions provided?: Yes Any new allergies since your discharge?: No Dietary orders reviewed?: NA Do you have support at home?: Yes People in Home [RPT]: spouse  Medications Reviewed Today: Medications Reviewed Today     Reviewed by Darrall Ellison, LPN (Licensed Practical Nurse) on 03/26/24 at 1047  Med List Status: <None>   Medication Order Taking? Sig Documenting Provider Last Dose Status Informant  acetaminophen  (TYLENOL ) 325 MG tablet [id number] No Take 2 tablets (650 mg total) by mouth every 6 (six) hours as needed.  Patient taking differently: Take 650 mg by mouth every 6 (six) hours as needed for mild pain (pain score 1-3), moderate pain (pain score 4-6), headache or fever.   Teddi Favors, DO Taking Active Self, Multiple Informants, Pharmacy Records  albuterol  (VENTOLIN  HFA) 108 [phone] Base) MCG/ACT inhaler [id number] No Inhale 2 puffs into the lungs 4 (four) times daily as needed for wheezing or shortness of breath. [place], Betty G, MD Taking Active   amLODipine  (NORVASC ) 2.5 MG tablet [id number]  TAKE 1 TABLET BY MOUTH EVERY DAY [place], Betty G, MD  Active   ARNUITY ELLIPTA  200 MCG/ACT AEPB [id number] No INHALE 1 PUFF INTO THE LUNGS EVERY MORNING. [place], Betty G, MD Taking  Active Self, Multiple Informants, Pharmacy Records  atorvastatin  (LIPITOR) 40 MG tablet [id number]  TAKE 1 TABLET BY MOUTH EVERY DAY [place], Betty G, MD  Active   Calcium  250 MG CAPS [id number] No Take 1 capsule by mouth daily. [provider] Taking Active Self, Multiple Informants, Pharmacy Records  cholecalciferol  (VITAMIN D ) 25 MCG (1000 UNIT) tablet [id number] No Take 1,000 Units by mouth at bedtime. [provider] Taking Active Self, Multiple Informants, Pharmacy Records  docusate sodium  (COLACE) 50 MG capsule [id number] No Take 100 mg by mouth 2 (two) times daily. [provider] Taking Active Self, Multiple Informants, Pharmacy Records  ELIQUIS  5 MG TABS tablet [id number] No TAKE 1 TABLET BY MOUTH TWICE A DAY [place], Betty G, MD Taking Active Self, Multiple Informants, Pharmacy Records  Ferrous Sulfate  (IRON) 325 (65 Fe) MG TABS [id number] No Take 1 tablet by mouth at bedtime. [provider] Taking Active Self, Multiple Informants, Pharmacy Records  Magnesium  250 MG TABS [id number] No Take 250 mg by mouth every morning. [provider] Taking Active Self, Multiple Informants, Pharmacy Records  Multiple Vitamin (MULTIVITAMIN WITH MINERALS) TABS tablet [id number] No Take 1 tablet by mouth every morning. [provider] Taking Active Self, Multiple Informants, Pharmacy Records  Omega-3 Fatty Acids (FISH OIL PO) [id number] No Take 1 capsule by mouth every morning. [provider] Taking Active Self, Multiple Informants, Pharmacy Records  omeprazole  (PRILOSEC) 40 MG capsule [id number]  Take 1 capsule (40 mg total) by mouth daily before breakfast. [place], Betty G, MD  Active   TIMOLOL  MALEATE OP [id number] No Place 1 drop into both eyes in the morning. 1 drop into both  eyes every morning [provider] Taking Active Self, Multiple Informants, Pharmacy Records            Home Care and Equipment/Supplies: Were Home Health Services  Ordered?: NA Any new equipment or medical supplies ordered?: NA  Functional Questionnaire: Do you need assistance with bathing/showering or dressing?: No Do you need assistance with meal preparation?: No Do you need assistance with eating?: No Do you have difficulty maintaining continence: No Do you need assistance with getting out of bed/getting out of a chair/moving?: No Do you have difficulty managing or taking your medications?: No  Follow up appointments reviewed: PCP Follow-up appointment confirmed?: Yes Date of PCP follow-up appointment?: 03/28/24 Follow-up Provider: [place] [facility] Follow-up appointment confirmed?: NA Do you need transportation to your follow-up appointment?: No Do you understand care options if your condition(s) worsen?: Yes-patient verbalized understanding    SIGNATURE Darrall Ellison, LPN [facility] Nurse Health Advisor Direct Dial [phone]

## 2024-03-28 ENCOUNTER — Encounter: Payer: Self-pay | Admitting: Family Medicine

## 2024-03-28 ENCOUNTER — Ambulatory Visit (INDEPENDENT_AMBULATORY_CARE_PROVIDER_SITE_OTHER): Admitting: Family Medicine

## 2024-03-28 VITALS — BP 136/70 | HR 91 | Resp 16 | Ht 66.0 in | Wt 158.5 lb

## 2024-03-28 DIAGNOSIS — G459 Transient cerebral ischemic attack, unspecified: Secondary | ICD-10-CM

## 2024-03-28 DIAGNOSIS — R4789 Other speech disturbances: Secondary | ICD-10-CM

## 2024-03-28 DIAGNOSIS — I10 Essential (primary) hypertension: Secondary | ICD-10-CM | POA: Diagnosis not present

## 2024-03-28 DIAGNOSIS — I480 Paroxysmal atrial fibrillation: Secondary | ICD-10-CM

## 2024-03-28 MED ORDER — AMLODIPINE BESYLATE 2.5 MG PO TABS: 5.0000 mg | ORAL_TABLET | Freq: Every day | ORAL | Status: DC

## 2024-03-28 NOTE — Patient Instructions (Addendum)
 A few things to remember from today's visit:  TIA (transient ischemic attack)  PAF (paroxysmal atrial fibrillation) (HCC)  Essential hypertension - Plan: amLODipine  (NORVASC ) 2.5 MG tablet Today we increased dose of Amlodipine  from 2.5 mg to 5 mg daily. Rest unchanged. If another episodes, please call your neurologist's office. Let me know about blood pressures in 2 weeks.  If you need refills for medications you take chronically, please call your pharmacy. Do not use My Chart to request refills or for acute issues that need immediate attention. If you send a my chart message, it may take a few days to be addressed, specially if I am not in the office.  Please be sure medication list is accurate. If a new problem present, please set up appointment sooner than planned today.

## 2024-03-28 NOTE — Assessment & Plan Note (Signed)
 Regular rhythm today. Currently on Eliquis  5 mg twice daily. He follows with cardiologist regularly.

## 2024-03-28 NOTE — Assessment & Plan Note (Signed)
 Home SBP readings 140's-150's and occasionally 160's. Recommend increasing amlodipine  from 2.5 mg daily to 5 mg daily. Low-salt diet also recommended. Continue monitoring BP regularly, a.m. and p.m., instructed to let me know about BP readings in 2 weeks.

## 2024-03-28 NOTE — Assessment & Plan Note (Signed)
 Recent episodes when he has difficulty with words finding could be related to this problem. Currently on atorvastatin  40 mg daily and Eliquis  5 mg twice daily. Last LDL 29 in 08/2023. Recommend to call his neurologist office if he has more episodes. Instructed about warning signs.

## 2024-03-28 NOTE — Progress Notes (Signed)
 ACUTE VISIT Chief Complaint  Patient presents with   Hospitalization Follow-up   HPI: [name] is a 83 y.o. male with a PMHx significant for TIA, PAF, HTN, HFpEF, asthma, GERD, peripheral neuropathy, and prediabetes, among some, who is here today with his wife for hospital follow up.   Patient presented to the ER on 03/20/2024 with speech difficulty, difficulty finding words.   CT Head from 03/20/2024 Impression:  Atrophy, chronic microvascular disease.  No acute intracranial abnormality.   He had another episode of difficulty finding words since returning home on 5/13 that lasted for about 45 minutes. He did not have any associated headache during this second episode. Denies slurred speech or associated focal neurologic deficit or MS changes. No associated bladder/bowel dysfunction. SBP during episode 150.  Wife mentions he has been having some breathing difficulties, breathing through his mouth, and more fatigue which she believes are related to allergies.  No associated CP, palpitation, or dizziness. Denies any major changes to his diet, fluid intake, or stress levels.  TIAs and PAF.  Currently on Eliquis  5 mg twice daily and atorvastatin  40 mg daily.  Lab Results  Component Value Date   CHOL 90 08/29/2023   HDL 40.00 08/29/2023   LDLCALC 29 08/29/2023   TRIG 104.0 08/29/2023   CHOLHDL 2 08/29/2023   Next appointment with neurology is in 08/2024.   Hypertension:  Medications: Currently on amlodipine  2.5 mg daily.   BP readings at home:Most SBP's 140-150's, a few 130's and 160's. DBP's 60-70's. Diet: Notes he has difficult avoiding salt with the food that is provided to him through his living community.  Negative for unusual or severe headache, visual changes, exertional chest pain,  focal weakness, or worsening edema.  Lab Results  Component Value Date   CREATININE 1.18 03/20/2024   BUN 26 (H) 03/20/2024   NA 138 03/20/2024   K 5.0 03/20/2024   CL 104  03/20/2024   CO2 24 03/20/2024   Review of Systems  Constitutional:  Negative for appetite change, chills, fever and unexpected weight change.  HENT:  Negative for sore throat.   Respiratory:  Negative for cough and wheezing.   Gastrointestinal:  Negative for abdominal pain, nausea and vomiting.  Endocrine: Negative for cold intolerance and heat intolerance.  Genitourinary:  Negative for decreased urine volume, dysuria and hematuria.  Musculoskeletal:  Positive for arthralgias and gait problem.  Skin:  Negative for rash.  Allergic/Immunologic: Positive for environmental allergies.  Neurological:  Negative for facial asymmetry.  Psychiatric/Behavioral:  Negative for confusion and hallucinations.   See other pertinent positives and negatives in HPI.  Current Outpatient Medications on File Prior to Visit  Medication Sig Dispense Refill   acetaminophen  (TYLENOL ) 325 MG tablet Take 2 tablets (650 mg total) by mouth every 6 (six) hours as needed. (Patient taking differently: Take 650 mg by mouth every 6 (six) hours as needed for mild pain (pain score 1-3), moderate pain (pain score 4-6), headache or fever.) 36 tablet 0   albuterol  (VENTOLIN  HFA) 108 (90 Base) MCG/ACT inhaler Inhale 2 puffs into the lungs 4 (four) times daily as needed for wheezing or shortness of breath. 8 g 3   ARNUITY ELLIPTA  200 MCG/ACT AEPB INHALE 1 PUFF INTO THE LUNGS EVERY MORNING. 90 each 3   atorvastatin  (LIPITOR) 40 MG tablet TAKE 1 TABLET BY MOUTH EVERY DAY 90 tablet 2   Calcium  250 MG CAPS Take 1 capsule by mouth daily.     cholecalciferol  (VITAMIN D )  25 MCG (1000 UNIT) tablet Take 1,000 Units by mouth at bedtime.     docusate sodium  (COLACE) 50 MG capsule Take 100 mg by mouth 2 (two) times daily.     ELIQUIS  5 MG TABS tablet TAKE 1 TABLET BY MOUTH TWICE A DAY 180 tablet 1   Ferrous Sulfate  (IRON) 325 (65 Fe) MG TABS Take 1 tablet by mouth at bedtime.     Magnesium  250 MG TABS Take 250 mg by mouth every morning.      Multiple Vitamin (MULTIVITAMIN WITH MINERALS) TABS tablet Take 1 tablet by mouth every morning.     Omega-3 Fatty Acids (FISH OIL PO) Take 1 capsule by mouth every morning.     omeprazole  (PRILOSEC) 40 MG capsule Take 1 capsule (40 mg total) by mouth daily before breakfast.     TIMOLOL  MALEATE OP Place 1 drop into both eyes in the morning. 1 drop into both eyes every morning     No current facility-administered medications on file prior to visit.   Past Medical History:  Diagnosis Date   Carotid artery occlusion    GERD (gastroesophageal reflux disease)    Hypertension    Stroke (HCC) 02/2021   TIA   No Known Allergies  Social History   Socioeconomic History   Marital status: Married    Spouse name: [name]   Number of children: Not on file   Years of education: Not on file   Highest education level: Associate degree: academic program  Occupational History   Not on file  Tobacco Use   Smoking status: Former    Passive exposure: Never   Smokeless tobacco: Never  Vaping Use   Vaping status: Never Used  Substance and Sexual Activity   Alcohol use: Not Currently    Comment: occassional   Drug use: Never   Sexual activity: Not Currently  Other Topics Concern   Not on file  Social History Narrative   Lives with wife   Social Drivers of Health   Financial Resource Strain: Low Risk  (05/11/2023)   Overall Financial Resource Strain (CARDIA)    Difficulty of Paying Living Expenses: Not hard at all  Food Insecurity: No Food Insecurity (01/24/2024)   Hunger Vital Sign    Worried About Running Out of Food in the Last Year: Never true    Ran Out of Food in the Last Year: Never true  Transportation Needs: No Transportation Needs (01/24/2024)   PRAPARE - Administrator, Civil Service (Medical): No    Lack of Transportation (Non-Medical): No  Physical Activity: Sufficiently Active (05/11/2023)   Exercise Vital Sign    Days of Exercise per Week: 5 days    Minutes of  Exercise per Session: 30 min  Recent Concern: Physical Activity - Insufficiently Active (05/11/2023)   Exercise Vital Sign    Days of Exercise per Week: 2 days    Minutes of Exercise per Session: 30 min  Stress: No Stress Concern Present (05/11/2023)   [name] of Occupational Health - Occupational Stress Questionnaire    Feeling of Stress : Not at all  Social Connections: Socially Integrated (01/24/2024)   Social Connection and Isolation Panel [NHANES]    Frequency of Communication with Friends and Family: More than three times a week    Frequency of Social Gatherings with Friends and Family: More than three times a week    Attends Religious Services: More than 4 times per year    Active Member of Clubs or  Organizations: Yes    Attends Engineer, structural: More than 4 times per year    Marital Status: Married   Vitals:   03/28/24 1521  BP: 136/70  Pulse: 91  Resp: 16  SpO2: 97%   Body mass index is 25.58 kg/m.  Physical Exam Vitals and nursing note reviewed.  Constitutional:      General: He is not in acute distress.    Appearance: He is well-developed.  HENT:     Head: Normocephalic and atraumatic.     Mouth/Throat:     Pharynx: Uvula midline.  Eyes:     Conjunctiva/sclera: Conjunctivae normal.  Cardiovascular:     Rate and Rhythm: Normal rate and regular rhythm.     Heart sounds: No murmur heard. Pulmonary:     Effort: Pulmonary effort is normal. No respiratory distress.     Breath sounds: Normal breath sounds.  Abdominal:     Palpations: Abdomen is soft. There is no mass.     Tenderness: There is no abdominal tenderness.  Musculoskeletal:     Right lower leg: 1+ Pitting Edema present.     Left lower leg: 1+ Pitting Edema present.  Skin:    General: Skin is warm.     Findings: No erythema or rash.  Neurological:     General: No focal deficit present.     Mental Status: He is alert and oriented to person, place, and time.     Comments:  Unstable gait assisted with a walker.  Psychiatric:        Mood and Affect: Mood and affect normal.   ASSESSMENT AND PLAN:  Mr. Shrock was seen today for hospital follow up.   Word finding difficulty He has had 2 episodes in the past week that did last up to 45 min, no other associated symptom and back to his baseline.  We discussed possible etiologies, TIA to be consider. Head CT 03/20/24 negative for  If problem presents again monitor for new symptoms and seek immediate medication attention if it last longer or new associated symptoms, in which case he was instructed to call 911.  TIA (transient ischemic attack) Assessment & Plan: Continue aggressive management of CV risk factors. Currently on atorvastatin  40 mg daily and Eliquis  5 mg twice daily. Last LDL 29 in 08/2023. Recommend to call his neurologist's office to arrange appt if he has more above episodes. Instructed about warning signs.  PAF (paroxysmal atrial fibrillation) (HCC) Assessment & Plan: Regular rhythm today. Currently on Eliquis  5 mg twice daily. He follows with cardiologist regularly.  Essential hypertension Assessment & Plan: Home SBP readings 140's-150's and occasionally 160's. Recommend increasing amlodipine  from 2.5 mg daily to 5 mg daily. Low-salt diet also recommended. Continue monitoring BP regularly, a.m. and p.m., instructed to let me know about BP readings in 2 weeks.  Orders: -     amLODIPine  Besylate; Take 2 tablets (5 mg total) by mouth daily.  I spent a total of 41 minutes in both face to face and non face to face activities for this visit on the date of this encounter. During this time history was obtained and documented, examination was performed, prior labs/imaging reviewed, and assessment/plan discussed.  Return if symptoms worsen or fail to improve, for keep next appointment.  I, Fritz Jewel Wierda, acting as a scribe for Pleas Carneal [place], MD., have documented all relevant documentation on the  behalf of Dannisha Eckmann [place], MD, as directed by  Mattilynn Forrer [place], MD while in the presence of  Marshell Dilauro [place], MD.   I, Gabrian Hoque [place], MD, have reviewed all documentation for this visit. The documentation on 03/28/24 for the exam, diagnosis, procedures, and orders are all accurate and complete.  Yehudah Standing G. [place], MD  [facility]. [place] office.

## 2024-03-30 ENCOUNTER — Ambulatory Visit: Payer: Self-pay | Admitting: Cardiology

## 2024-04-17 ENCOUNTER — Telehealth: Payer: Self-pay

## 2024-04-17 NOTE — Telephone Encounter (Signed)
 Copied from CRM [id number]. Topic: Clinical - Medication Question >> Apr 17, 2024  3:54 PM [name] wrote: Reason for CRM: Patient called. States provider told him to call in two weeks and let her know how everything was going after increasing Norvasc  to two pills. Patient states everything has been great and seems to be working well. Thank You

## 2024-04-18 ENCOUNTER — Other Ambulatory Visit: Payer: Self-pay | Admitting: Family Medicine

## 2024-04-18 DIAGNOSIS — I10 Essential (primary) hypertension: Secondary | ICD-10-CM

## 2024-04-18 MED ORDER — AMLODIPINE BESYLATE 5 MG PO TABS: 5.0000 mg | ORAL_TABLET | Freq: Every day | ORAL | 2 refills | Status: DC

## 2024-04-18 NOTE — Telephone Encounter (Signed)
 I sent a new prescription for amlodipine  5 mg to continue once daily after he runs out of 2.5 mg. Thanks, BJ

## 2024-05-12 ENCOUNTER — Ambulatory Visit: Admitting: Gastroenterology

## 2024-05-12 ENCOUNTER — Encounter: Payer: Self-pay | Admitting: Gastroenterology

## 2024-05-12 VITALS — BP 116/56 | HR 64 | Ht 66.0 in | Wt 154.4 lb

## 2024-05-12 DIAGNOSIS — D649 Anemia, unspecified: Secondary | ICD-10-CM | POA: Diagnosis not present

## 2024-05-12 DIAGNOSIS — R1310 Dysphagia, unspecified: Secondary | ICD-10-CM

## 2024-05-12 DIAGNOSIS — R1319 Other dysphagia: Secondary | ICD-10-CM

## 2024-05-12 DIAGNOSIS — K449 Diaphragmatic hernia without obstruction or gangrene: Secondary | ICD-10-CM

## 2024-05-12 DIAGNOSIS — K219 Gastro-esophageal reflux disease without esophagitis: Secondary | ICD-10-CM

## 2024-05-12 DIAGNOSIS — E611 Iron deficiency: Secondary | ICD-10-CM

## 2024-05-12 NOTE — Progress Notes (Addendum)
 [name] [id number] 1941/07/10   Chief Complaint: Trouble swallowing  Referring Provider: [place], Betty G, MD Primary GI MD: Unassigned   HPI: [name] is a 83 y.o. male with past medical history of colon polyps, dysphagia, GERD, hiatal hernia, Schatzki's ring s/p dilation 2019, carotid artery occlusion s/p endarterectomy 2021, TIA 2022, HTN, pacemaker implant who presents today to discuss dysphagia.    Previously followed with Dr. Debby Pun [facility]). History of colon polyps on previous colonoscopy 2013. Repeat in 2017 was normal aside from sigmoid diverticulosis.   Patient here today with his wife.  They reside in a retirement facility.  Moved here about 3 years ago from [place] to be closer to their daughter.  Patient states he has been taking omeprazole  40 mg once daily for about 8 years.  Despite daily use of PPI he does still have intermittent acid reflux after dinner particularly.  Also has been having dysphagia with a sensation that food gets stuck in his throat, particularly after eating rice or drinking cold liquids with meals.  He is able to get food to pass if he drinks a warm liquid.  The swallowing problems occur 1-3 times a month.  Because he lives in a retirement facility he does not have control over what is offered for meals, and foods that he would normally avoid are sometimes served.  He reports that he did have improvement in swallowing after his last EGD with dilation in 2019, and began to have a recurrence of dysphagia symptoms about 3 years ago.  He reports regular bowel movements once or twice a day and denies any constipation, diarrhea, melena, or rectal bleeding.  He denies any abdominal pain, nausea, vomiting.   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   Patient has anemia.  States he takes an oral iron supplement.   Reports that he used to donate blood frequently.  Patient reports he has had 5-6 TIAs and is on Eliquis .  Has a pacemaker in place.  Also reports that he has asthma and easily becomes short of breath even with minimal exertion.  Previous GI Procedures/Imaging   EGD 10/23/2018 1. 4 cm hiatal hernia  2. Cameron's erosions associated with hiatal hernia  3. Schatzki ring was found at the gastroesophageal junction; Using a TTS-balloon the stricture was dilated up to 18mm; The balloon was held inflated for 60 seconds; Following this dilation, there was a small mucosal rent  4. The duodenal mucosa showed no abnormalities in the total duodenum   Colonoscopy 12/24/2015 - Mild sigmoid diverticulosis - Otherwise normal exam.  No recurrent polyps. - Follow-up colonoscopy 5 years? Age 89   Past Medical History:  Diagnosis Date   Carotid artery occlusion    GERD (gastroesophageal reflux disease)    Hypertension    Stroke (HCC) 02/2021   TIA    Past Surgical History:  Procedure Laterality Date   CATARACT EXTRACTION, BILATERAL     ENDARTERECTOMY Left 09/16/2020   Procedure: LEFT ENDARTERECTOMY CAROTID;  Surgeon: Sheree Penne Bruckner, MD;  Location: [facility] OR;  Service: Vascular;  Laterality: Left;   HERNIA REPAIR     PACEMAKER IMPLANT     TONSILLECTOMY      Current Outpatient Medications  Medication Sig Dispense Refill   acetaminophen  (TYLENOL ) 325 MG tablet Take 2 tablets (650 mg total) by mouth every 6 (six) hours as needed. (Patient  taking differently: Take 650 mg by mouth every 6 (six) hours as needed for mild pain (pain score 1-3), moderate pain (pain score 4-6), headache or fever.) 36 tablet 0   albuterol  (VENTOLIN  HFA) 108 (90 Base) MCG/ACT inhaler Inhale 2 puffs into the lungs 4 (four) times daily as needed for wheezing or shortness of breath. 8 g 3   amLODipine  (NORVASC ) 5 MG tablet Take 1 tablet (5 mg total) by mouth daily. 90 tablet 2   ARNUITY ELLIPTA  200 MCG/ACT AEPB INHALE 1  PUFF INTO THE LUNGS EVERY MORNING. 90 each 3   atorvastatin  (LIPITOR) 40 MG tablet TAKE 1 TABLET BY MOUTH EVERY DAY 90 tablet 2   Calcium  250 MG CAPS Take 1 capsule by mouth daily.     cholecalciferol  (VITAMIN D ) 25 MCG (1000 UNIT) tablet Take 1,000 Units by mouth at bedtime.     docusate sodium  (COLACE) 50 MG capsule Take 100 mg by mouth 2 (two) times daily.     ELIQUIS  5 MG TABS tablet TAKE 1 TABLET BY MOUTH TWICE A DAY 180 tablet 1   Ferrous Sulfate  (IRON) 325 (65 Fe) MG TABS Take 1 tablet by mouth at bedtime.     Magnesium  250 MG TABS Take 250 mg by mouth every morning.     Multiple Vitamin (MULTIVITAMIN WITH MINERALS) TABS tablet Take 1 tablet by mouth every morning.     Omega-3 Fatty Acids (FISH OIL PO) Take 1 capsule by mouth every morning.     omeprazole  (PRILOSEC) 40 MG capsule Take 1 capsule (40 mg total) by mouth daily before breakfast.     TIMOLOL  MALEATE OP Place 1 drop into both eyes in the morning. 1 drop into both eyes every morning     No current facility-administered medications for this visit.    Allergies as of 05/12/2024   (No Known Allergies)    Family History  Family history unknown: Yes    Social History   Tobacco Use   Smoking status: Former    Passive exposure: Never   Smokeless tobacco: Never  Vaping Use   Vaping status: Never Used  Substance Use Topics   Alcohol use: Not Currently    Comment: occassional   Drug use: Never     Review of Systems:    Constitutional: No unexplained weight loss, fever, chills, weakness or fatigue Skin: No rash or itching Cardiovascular: No chest pain, chest pressure or palpitations   Respiratory: Shortness of breath with exertion Gastrointestinal: See HPI and otherwise negative Genitourinary: No dysuria or change in urinary frequency Neurological: No headache, dizziness or syncope Musculoskeletal: No new muscle or joint pain Hematologic: No bleeding or bruising    Physical Exam:  Vital signs: BP (!) 116/56  (BP Location: Right Arm, Patient Position: Sitting, Cuff Size: Normal)   Pulse 64   Ht 5' 6 (1.676 m)   Wt 154 lb 7 oz (70.1 kg)   BMI 24.93 kg/m    Constitutional: NAD, Well developed, Well nourished, alert and cooperative.  Uses a walker. Head:  Normocephalic and atraumatic.  Eyes: No scleral icterus. Conjunctiva pink. Mouth: No oral lesions. Respiratory: Respirations even and unlabored. Lungs clear to auscultation bilaterally.  No wheezes, crackles, or rhonchi.  Cardiovascular:  Regular rate and rhythm. No murmurs.  Gastrointestinal:  Soft, nondistended, nontender. No rebound or guarding. Normal bowel sounds. No appreciable masses or hepatomegaly. Rectal:  Not performed.  Neurologic:  Alert and oriented x4;  grossly normal neurologically.  Skin:   Dry and intact  without significant lesions or rashes. Psychiatric: Oriented to person, place and time. Demonstrates good judgement and reason without abnormal affect or behaviors.   RELEVANT LABS AND IMAGING: CBC    Component Value Date/Time   WBC 6.5 03/20/2024 1731   RBC 3.65 (L) 03/20/2024 1731   HGB 12.0 (L) 03/20/2024 1731   HCT 34.6 (L) 03/20/2024 1731   PLT 178 03/20/2024 1731   MCV 94.8 03/20/2024 1731   MCH 32.9 03/20/2024 1731   MCHC 34.7 03/20/2024 1731   RDW 13.5 03/20/2024 1731   LYMPHSABS 0.5 (L) 01/24/2024 0603   MONOABS 0.1 01/24/2024 0603   EOSABS 0.0 01/24/2024 0603   BASOSABS 0.0 01/24/2024 0603    CMP     Component Value Date/Time   NA 138 03/20/2024 1731   K 5.0 03/20/2024 1731   CL 104 03/20/2024 1731   CO2 24 03/20/2024 1731   GLUCOSE 99 03/20/2024 1731   BUN 26 (H) 03/20/2024 1731   CREATININE 1.18 03/20/2024 1731   CALCIUM  9.2 03/20/2024 1731   PROT 6.1 (L) 03/20/2024 1731   ALBUMIN 4.2 03/20/2024 1731   AST 27 03/20/2024 1731   ALT 27 03/20/2024 1731   ALKPHOS 122 03/20/2024 1731   BILITOT 0.5 03/20/2024 1731   GFRNONAA >60 03/20/2024 1731   Echocardiogram 03/13/2021 1. Left ventricular  ejection fraction, by estimation, is 60 to 65% . The left ventricle has normal function. The left ventricle has no regional wall motion abnormalities. There is moderate concentric left ventricular hypertrophy. Left ventricular diastolic parameters are indeterminate.  2. Right ventricular systolic function is normal. The right ventricular size is normal. There is normal pulmonary artery systolic pressure. The estimated right ventricular systolic pressure is 31. 9 mmHg.  3. The mitral valve is normal in structure. Trivial mitral valve regurgitation. No evidence of mitral stenosis.  4. The aortic valve is normal in structure. Aortic valve regurgitation is mild. No aortic stenosis is present.  5. The inferior vena cava is normal in size with greater than 50% respiratory variability, suggesting right atrial pressure of 3 mmHg.  Assessment/Plan:   Esophageal dysphagia GERD Hiatal hernia Normocytic anemia Iron deficiency Patient with history of GERD and dysphagia.  Last EGD 2019 with finding of 4 cm hiatal hernia, Cameron's erosions, and Schatzki ring which was dilated.  States he had relief of dysphagia symptoms for a while but started noticing problem swallowing again about 3 years ago.  He currently has trouble swallowing 1-3 times a month, particularly with rice or if he drinks cold beverages while eating.  He is able to get food to pass by drinking warm liquids and does not have to regurgitate food.  He has been having some breakthrough acid reflux on occasion, particularly after dinner.  Patient has asthma and has shortness of breath with minimal exertion.  History significant for multiple TIAs, on Eliquis .  Has pacemaker.  - Order barium swallow study for further evaluation of dysphagia. - Continue omeprazole  40mg  daily - Follow up in office with MD to discuss possible EGD w/ dilation.   Cameron Furbish, PA-C [facility] 05/12/2024, 8:13 AM  Patient Care Team: [place], Betty G, MD as  PCP - General (Family Medicine) Cindie Ole DASEN, MD as PCP - Electrophysiology (Cardiology) Lionell Jon DEL, [facility] (Pharmacist)    Gastroenterology attending physician:  Agree with the above evaluation and plan with the following additions:  As reflected multiple TIAs suspect recent TIAs in May in a patient with atrial fibrillation on Eliquis .  It  certainly sounds like he is at higher risk of having neurologic event off Eliquis  which would be required for endoscopic procedure.  I also recommend increasing omeprazole  to 40 mg before breakfast and before supper i.e. twice daily dosing.  This could reduce dysphagia episodes.  Will have Ms.  [name] coordinate that.  Cameron CHARLENA Commander, MD, [facility]

## 2024-05-12 NOTE — Patient Instructions (Signed)
 Continue Omeprazole  40 mg daily 30-60 minutes before breakfast.   You have been scheduled for a Barium Esophogram at [facility] (1st floor of the hospital) on Tuesday 05/27/24 at 10 am. Please arrive 30 minutes prior to your appointment for registration. Make certain not to have anything to eat or drink 3 hours prior to your test. If you need to reschedule for any reason, please contact radiology at [phone] to do so. __________________________________________________________________ A barium swallow is an examination that concentrates on views of the esophagus. This tends to be a double contrast exam (barium and two liquids which, when combined, create a gas to distend the wall of the oesophagus) or single contrast (non-ionic iodine based). The study is usually tailored to your symptoms so a good history is essential. Attention is paid during the study to the form, structure and configuration of the esophagus, looking for functional disorders (such as aspiration, dysphagia, achalasia, motility and reflux) EXAMINATION You may be asked to change into a gown, depending on the type of swallow being performed. A radiologist and radiographer will perform the procedure. The radiologist will advise you of the type of contrast selected for your procedure and direct you during the exam. You will be asked to stand, sit or lie in several different positions and to hold a small amount of fluid in your mouth before being asked to swallow while the imaging is performed .In some instances you may be asked to swallow barium coated marshmallows to assess the motility of a solid food bolus. The exam can be recorded as a digital or video fluoroscopy procedure. POST PROCEDURE It will take 1-2 days for the barium to pass through your system. To facilitate this, it is important, unless otherwise directed, to increase your fluids for the next 24-48hrs and to resume your normal diet.  This test typically takes  about 30 minutes to perform. _________________________________________________________________

## 2024-05-12 NOTE — Progress Notes (Signed)
 Remote pacemaker transmission.

## 2024-05-13 ENCOUNTER — Encounter: Payer: Self-pay | Admitting: Gastroenterology

## 2024-05-19 ENCOUNTER — Telehealth: Payer: Self-pay

## 2024-05-19 ENCOUNTER — Telehealth: Payer: Self-pay | Admitting: Gastroenterology

## 2024-05-19 ENCOUNTER — Other Ambulatory Visit: Payer: Self-pay | Admitting: Family Medicine

## 2024-05-19 DIAGNOSIS — K219 Gastro-esophageal reflux disease without esophagitis: Secondary | ICD-10-CM

## 2024-05-19 NOTE — Telephone Encounter (Signed)
 Copied from CRM [id number]. Topic: Clinical - Medication Question >> May 19, 2024  2:35 PM Franky GRADE wrote: Reason for CRM: Patient would like to know if we can change the instructions for his amLODipine  (NORVASC ) 5 MG tablet [[id number]] from one a day to two day as his pharmacy is not letting him fill. He will be out by Saturday.

## 2024-05-19 NOTE — Telephone Encounter (Signed)
 Please reach out to patient and recommend he increase his omeprazole  to 40 mg twice daily, before breakfast and dinner. This may help with his breakthrough GERD symptoms and intermittent swallowing trouble. If he is agreeable to this we can send in a new prescription for omeprazole  40 mg BID, #60, 3 refills.

## 2024-05-19 NOTE — Telephone Encounter (Signed)
 I called and spoke with pharmacy, pt picked up a 90 day supply of Amlodipine  5 mg in June. He should only be taking 1 5 mg tablet a day. I left pt a voicemail to call the office back if he is taking more than 1 5 mg tablet daily.

## 2024-05-20 MED ORDER — OMEPRAZOLE 40 MG PO CPDR
40.0000 mg | DELAYED_RELEASE_CAPSULE | Freq: Two times a day (BID) | ORAL | 3 refills | Status: DC
Start: 2024-05-20 — End: 2024-08-18

## 2024-05-20 NOTE — Telephone Encounter (Signed)
 Patient's wife informed to increase Omeprazole  and voiced understanding.

## 2024-05-22 ENCOUNTER — Telehealth: Payer: Self-pay | Admitting: *Deleted

## 2024-05-22 NOTE — Telephone Encounter (Signed)
 Reason for CRM: Patient wanted to let Dr. [place] know that he starting yesterday (05/21/2024) he has started taking 2 omeprazole  tablets a day instead of 1, per the medical assistant he spoke with earlier in the week.

## 2024-05-23 NOTE — Telephone Encounter (Signed)
 FYI

## 2024-05-27 ENCOUNTER — Ambulatory Visit (HOSPITAL_COMMUNITY)
Admission: RE | Admit: 2024-05-27 | Discharge: 2024-05-27 | Disposition: A | Discharge status: Home / Self Care | Source: Ambulatory Visit | Attending: Gastroenterology | Admitting: Gastroenterology

## 2024-05-27 DIAGNOSIS — K449 Diaphragmatic hernia without obstruction or gangrene: Secondary | ICD-10-CM | POA: Insufficient documentation

## 2024-05-27 DIAGNOSIS — R1319 Other dysphagia: Secondary | ICD-10-CM | POA: Insufficient documentation

## 2024-05-27 DIAGNOSIS — K219 Gastro-esophageal reflux disease without esophagitis: Secondary | ICD-10-CM | POA: Insufficient documentation

## 2024-05-28 ENCOUNTER — Other Ambulatory Visit: Payer: Self-pay | Admitting: *Deleted

## 2024-05-28 ENCOUNTER — Ambulatory Visit: Payer: Self-pay | Admitting: Gastroenterology

## 2024-05-28 DIAGNOSIS — R1319 Other dysphagia: Secondary | ICD-10-CM

## 2024-05-28 DIAGNOSIS — K224 Dyskinesia of esophagus: Secondary | ICD-10-CM

## 2024-05-28 NOTE — Progress Notes (Signed)
 Left message to schedule modified barium swallow.

## 2024-05-30 ENCOUNTER — Other Ambulatory Visit (HOSPITAL_COMMUNITY): Payer: Self-pay | Admitting: Gastroenterology

## 2024-05-30 DIAGNOSIS — R1310 Dysphagia, unspecified: Secondary | ICD-10-CM

## 2024-05-30 DIAGNOSIS — R059 Cough, unspecified: Secondary | ICD-10-CM

## 2024-06-16 ENCOUNTER — Ambulatory Visit (HOSPITAL_COMMUNITY)

## 2024-06-16 ENCOUNTER — Ambulatory Visit (HOSPITAL_COMMUNITY)
Admission: RE | Admit: 2024-06-16 | Discharge: 2024-06-16 | Disposition: A | Discharge status: Home / Self Care | Source: Ambulatory Visit | Attending: *Deleted | Admitting: *Deleted

## 2024-06-16 DIAGNOSIS — R1319 Other dysphagia: Secondary | ICD-10-CM

## 2024-06-16 DIAGNOSIS — K224 Dyskinesia of esophagus: Secondary | ICD-10-CM

## 2024-06-19 ENCOUNTER — Ambulatory Visit (HOSPITAL_COMMUNITY)
Admission: RE | Admit: 2024-06-19 | Discharge: 2024-06-19 | Disposition: A | Discharge status: Home / Self Care | Source: Ambulatory Visit | Attending: Gastroenterology | Admitting: Gastroenterology

## 2024-06-19 DIAGNOSIS — R059 Cough, unspecified: Secondary | ICD-10-CM | POA: Insufficient documentation

## 2024-06-19 DIAGNOSIS — R1310 Dysphagia, unspecified: Secondary | ICD-10-CM | POA: Diagnosis present

## 2024-06-19 NOTE — Progress Notes (Signed)
 Modified Barium Swallow Study  Patient Details  Name: [name] MRN: [id number] Date of Birth: 06/17/1941  Today's Date: 06/19/2024  Modified Barium Swallow completed.  Full report located under Chart Review in the Imaging Section.  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 Consider speech  pathology evaluation and EMR includes problem list item of aspiration into airway. PMH: half of thyroid removed 1985, hiatal hernia, TIA. Pt reports pharyngeal globus sensation and in chest with foods such as meat and rice, pain after meals and he takes warm liquid to help open it up.   Clinical Impression Pt exhibited a safe and efficient swallow across all trials. His pharyngeal/laryngeal ROM, timing and strength were within functional limits. Noted was possible cervical osteophytes vs fusion vs curvature and pt reports history of scoliosis. There were no instances of laryngeal penetration or aspiration even when challenged with larger, sequential sips thin barium. Intermittent and trace residue in valleculae and pyriform sinsuses with thin and trace in valleculae with nectar that he sensed and spontaneously swallowed to clear. MBS does not diagnose below level of the PES however during esophageal scan the barium pill stopped at the GE jucntion. Additional thin barium was ineffective but bite of barium/applesauce assisted transit into stomach. Recommend continue regular texture, thin liquids, pills with thin followed by bite of solid. Educated pt and wife re: results and general esophageal precautions, answered questions and they verbalized understanding. Factors that may increase risk of adverse event in presence of aspiration [name]  2021):    Swallow Evaluation Recommendations Recommendations: PO diet PO Diet Recommendation: Regular;Thin liquids (Level 0) Liquid Administration via: Cup;Straw Medication Administration: Whole meds with liquid Supervision: Patient able to self-feed Swallowing strategies  :  (esophageal strategies) Postural changes: Position pt fully upright for meals;Stay upright 30-60 min after meals Oral care recommendations: Oral care BID (2x/day)      [name] 06/19/2024,12:16 PM

## 2024-06-20 ENCOUNTER — Ambulatory Visit: Payer: Self-pay | Admitting: Gastroenterology

## 2024-06-24 ENCOUNTER — Ambulatory Visit: Payer: Medicare Other

## 2024-06-25 ENCOUNTER — Other Ambulatory Visit: Payer: Self-pay | Admitting: Family Medicine

## 2024-06-25 DIAGNOSIS — I480 Paroxysmal atrial fibrillation: Secondary | ICD-10-CM

## 2024-07-08 ENCOUNTER — Encounter: Payer: Self-pay | Admitting: Internal Medicine

## 2024-07-08 ENCOUNTER — Ambulatory Visit (INDEPENDENT_AMBULATORY_CARE_PROVIDER_SITE_OTHER)

## 2024-07-08 ENCOUNTER — Ambulatory Visit (INDEPENDENT_AMBULATORY_CARE_PROVIDER_SITE_OTHER): Admitting: Internal Medicine

## 2024-07-08 VITALS — BP 110/62 | HR 62 | Ht 66.0 in | Wt 152.0 lb

## 2024-07-08 DIAGNOSIS — K257 Chronic gastric ulcer without hemorrhage or perforation: Secondary | ICD-10-CM | POA: Diagnosis not present

## 2024-07-08 DIAGNOSIS — K449 Diaphragmatic hernia without obstruction or gangrene: Secondary | ICD-10-CM | POA: Diagnosis not present

## 2024-07-08 DIAGNOSIS — G459 Transient cerebral ischemic attack, unspecified: Secondary | ICD-10-CM

## 2024-07-08 DIAGNOSIS — I480 Paroxysmal atrial fibrillation: Secondary | ICD-10-CM

## 2024-07-08 DIAGNOSIS — R1319 Other dysphagia: Secondary | ICD-10-CM

## 2024-07-08 DIAGNOSIS — Z7901 Long term (current) use of anticoagulants: Secondary | ICD-10-CM

## 2024-07-08 DIAGNOSIS — Z8673 Personal history of transient ischemic attack (TIA), and cerebral infarction without residual deficits: Secondary | ICD-10-CM

## 2024-07-08 NOTE — Patient Instructions (Signed)
 Please take your omeprazole  30 minutes prior to breakfast and 30 minutes prior to supper for it to work the best.   _______________________________________________________  If your blood pressure at your visit was 140/90 or greater, please contact your primary care physician to follow up on this.  _______________________________________________________  If you are age 84 or older, your body mass index should be between 23-30. Your Body mass index is 24.53 kg/m. If this is out of the aforementioned range listed, please consider follow up with your Primary Care Provider.  If you are age 40 or younger, your body mass index should be between 19-25. Your Body mass index is 24.53 kg/m. If this is out of the aformentioned range listed, please consider follow up with your Primary Care Provider.   ________________________________________________________  The [facility] providers would like to encourage you to use MYCHART to communicate with providers for non-urgent requests or questions.  Due to long hold times on the telephone, sending your provider a message by [facility] may be a faster and more efficient way to get a response.  Please allow 48 business hours for a response.  Please remember that this is for non-urgent requests.  _______________________________________________________  [facility] is using a team-based approach to care.  Your team is made up of your doctor and two to three APPS. Our APPS (Nurse Practitioners and Physician Assistants) work with your physician to ensure care continuity for you. They are fully qualified to address your health concerns and develop a treatment plan. They communicate directly with your gastroenterologist to care for you. Seeing the Advanced Practice Practitioners on your physician's team can help you by facilitating care more promptly, often allowing for earlier appointments, access to diagnostic testing, procedures, and other specialty referrals.     I appreciate the opportunity to care for you. Lupita Commander, MD, [facility]

## 2024-07-08 NOTE — Progress Notes (Unsigned)
 [name] 83 y.o. [birth year] [id number]  Assessment & Plan:   Encounter Diagnoses  Name Primary?   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 Barium swallow showed large hiatal hernia and tablet lodging at esophagus-stomach junction. Symptoms improved with increased medication but some difficulty persists. Endoscopy and dilation considered high risk due to anticoagulation and TIAs. He opted to continue current management. - Continue current medication regimen: one pill before breakfast and one before supper, 30 minutes before meals. - Avoid cold liquids during meals. - Consider endoscopy and dilation if symptoms worsen, acknowledging increased risk due to anticoagulation.  Gastroesophageal reflux disease (GERD) GERD with esophageal damage contributing to dysphagia. Increased medication dosage to twice daily improved symptoms. - Continue acid-reducing medication twice daily: one pill before breakfast and one before supper, 30 minutes before meals.         Subjective:   Chief Complaint:  HPI [name] is a 83 y.o. male with past medical history of colon polyps, dysphagia, GERD, hiatal hernia, Schatzki's ring s/p dilation 2019, carotid artery occlusion s/p endarterectomy 2021, TIA 2022, HTN, pacemaker implant.  He is here for follow-up after he had an evaluation in the clinic by Lauraine Furbish PA-C on 05/28/2024.  A barium swallow test was ordered and showed a large hiatal hernia and a 13 mm tablet passed.  There was a question of aspiration on that exam and he had a modified barium swallow which did not show aspiration issues but the 13 mm tablet did impact in  the distal esophagus.  He did not have symptoms with that.  He has had his omeprazole  increased from 40 mg daily to 40 mg twice daily.  He was taking it twice a day but then for some reason started taking it 2 pills in the morning.   He has a history of esophageal stricture diagnosed approximately six years ago, for which he underwent dilation. He has been on medication since then to manage his symptoms. Recently, he has experienced episodes where food and pills temporarily lodge at the junction of the esophagus and stomach, particularly with cold drinks and foods like rice and meat. Warm beverages, such as tea, help alleviate these symptoms.  He has a history of transient ischemic attacks (TIAs) and was hospitalized in March for a viral illness. In May, he experienced a transient episode of slurred speech in May, but it was not considered a TIA by the ED attending physician. He is under the care of a cardiologist and is on Eliquis .  His wife has noticed he is having increased burping over the past month, which is a new symptom for him. He has adjusted his habits to avoid cold drinks, as they seem to exacerbate his swallowing difficulties.  . No aspiration of liquids or foods during the modified barium swallow study.      Wt Readings from Last 3 Encounters:  07/08/24 152 lb (68.9 kg)  05/12/24 154 lb 7 oz (70.1 kg)  03/28/24 158 lb 8 oz (71.9 kg)   EGD 10/23/2018 1. 4 cm hiatal hernia  2. Cameron's erosions associated with hiatal hernia  3. Schatzki ring was  found at the gastroesophageal junction; Using a TTS-balloon the stricture was dilated up to 18mm; The balloon was held inflated for 60 seconds; Following this dilation, there was a small mucosal rent  4. The duodenal mucosa showed no abnormalities in the total duodenum    Colonoscopy 12/24/2015 - Mild sigmoid diverticulosis - Otherwise normal exam.  No recurrent polyps. No Known Allergies Current Meds  Medication Sig   acetaminophen   (TYLENOL ) 325 MG tablet Take 2 tablets (650 mg total) by mouth every 6 (six) hours as needed.   albuterol  (VENTOLIN  HFA) 108 (90 Base) MCG/ACT inhaler Inhale 2 puffs into the lungs 4 (four) times daily as needed for wheezing or shortness of breath.   amLODipine  (NORVASC ) 5 MG tablet Take 1 tablet (5 mg total) by mouth daily.   ARNUITY ELLIPTA  200 MCG/ACT AEPB INHALE 1 PUFF INTO THE LUNGS EVERY MORNING.   atorvastatin  (LIPITOR) 40 MG tablet TAKE 1 TABLET BY MOUTH EVERY DAY   Calcium  250 MG CAPS Take 1 capsule by mouth daily.   cholecalciferol  (VITAMIN D ) 25 MCG (1000 UNIT) tablet Take 1,000 Units by mouth at bedtime.   docusate sodium  (COLACE) 50 MG capsule Take 100 mg by mouth 2 (two) times daily.   ELIQUIS  5 MG TABS tablet TAKE 1 TABLET BY MOUTH TWICE A DAY   Ferrous Sulfate  (IRON) 325 (65 Fe) MG TABS Take 1 tablet by mouth at bedtime.   Magnesium  250 MG TABS Take 250 mg by mouth every morning.   Multiple Vitamin (MULTIVITAMIN WITH MINERALS) TABS tablet Take 1 tablet by mouth every morning.   Omega-3 Fatty Acids (FISH OIL PO) Take 1 capsule by mouth every morning.   omeprazole  (PRILOSEC) 40 MG capsule Take 1 capsule (40 mg total) by mouth 2 (two) times daily.   TIMOLOL  MALEATE OP Place 1 drop into both eyes in the morning. 1 drop into both eyes every morning   Past Medical History:  Diagnosis Date   Carotid artery occlusion    GERD (gastroesophageal reflux disease)    Hypertension    Stroke (HCC) 02/2021   TIA   Past Surgical History:  Procedure Laterality Date   CATARACT EXTRACTION, BILATERAL     ENDARTERECTOMY Left 09/16/2020   Procedure: LEFT ENDARTERECTOMY CAROTID;  Surgeon: Sheree Penne Bruckner, MD;  [facility];  Service: Vascular;  Laterality: Left;   HERNIA REPAIR     PACEMAKER IMPLANT     TONSILLECTOMY     Social History   Social History Narrative   Lives with wife   Family history is unknown by patient.   Review of Systems   Objective:   Physical  Exam BP 110/62 (BP Location: Right Arm, Patient Position: Sitting, Cuff Size: Normal)   Pulse 62   Ht 5' 6 (1.676 m)   Wt 152 lb (68.9 kg)   BMI 24.53 kg/m  Elderly man, chronically ill

## 2024-07-09 ENCOUNTER — Ambulatory Visit: Payer: Self-pay | Admitting: Cardiology

## 2024-07-09 LAB — CUP PACEART REMOTE DEVICE CHECK
Battery Remaining Longevity: 22 mo
Battery Remaining Percentage: 17 %
Battery Voltage: 2.89 V
Brady Statistic AP VP Percent: 2.1 %
Brady Statistic AP VS Percent: 96 %
Brady Statistic AS VP Percent: 1 %
Brady Statistic AS VS Percent: 2 %
Brady Statistic RA Percent Paced: 96 %
Brady Statistic RV Percent Paced: 2.2 %
Date Time Interrogation Session: 20250826020011
Implantable Lead Connection Status: 753985
Implantable Lead Connection Status: 753985
Implantable Lead Implant Date: 20160928
Implantable Lead Implant Date: 20160928
Implantable Lead Location: 753859
Implantable Lead Location: 753860
Implantable Lead Model: 1948
Implantable Pulse Generator Implant Date: 20160928
Lead Channel Impedance Value: 400 Ohm
Lead Channel Impedance Value: 540 Ohm
Lead Channel Pacing Threshold Amplitude: 0.875 V
Lead Channel Pacing Threshold Amplitude: 1 V
Lead Channel Pacing Threshold Pulse Width: 0.4 ms
Lead Channel Pacing Threshold Pulse Width: 0.4 ms
Lead Channel Sensing Intrinsic Amplitude: 1.8 mV
Lead Channel Sensing Intrinsic Amplitude: 3.6 mV
Lead Channel Setting Pacing Amplitude: 1.25 V
Lead Channel Setting Pacing Amplitude: 1.875
Lead Channel Setting Pacing Pulse Width: 0.4 ms
Lead Channel Setting Sensing Sensitivity: 0.7 mV
Pulse Gen Model: 2240
Pulse Gen Serial Number: 7817736

## 2024-07-29 NOTE — Progress Notes (Signed)
 Remote PPM Transmission

## 2024-08-04 ENCOUNTER — Telehealth: Payer: Self-pay | Admitting: *Deleted

## 2024-08-04 NOTE — Telephone Encounter (Signed)
 Copied from CRM [id number]. Topic: Clinical - Request for Lab/Test Order >> Aug 04, 2024 11:24 AM [name] wrote: Reason for CRM:  [name] called and she needs a prescription for a COVID vaccine to be faxed to [facility] at [place] at [phone] for [facility]. She needs this by tomorrow because they just found out they need the order.

## 2024-08-04 NOTE — Telephone Encounter (Signed)
 Prescriptions are no longer required. Thanks, BJ

## 2024-08-04 NOTE — Telephone Encounter (Signed)
Left detailed message on vm informing patient of the message below  

## 2024-08-17 ENCOUNTER — Encounter (HOSPITAL_COMMUNITY): Payer: Self-pay

## 2024-08-17 ENCOUNTER — Emergency Department (HOSPITAL_COMMUNITY)

## 2024-08-17 ENCOUNTER — Other Ambulatory Visit: Payer: Self-pay

## 2024-08-17 ENCOUNTER — Emergency Department (HOSPITAL_COMMUNITY)
Admission: EM | Admit: 2024-08-17 | Discharge: 2024-08-17 | Disposition: A | Discharge status: Home / Self Care | Attending: Emergency Medicine | Admitting: Emergency Medicine

## 2024-08-17 DIAGNOSIS — W01198A Fall on same level from slipping, tripping and stumbling with subsequent striking against other object, initial encounter: Secondary | ICD-10-CM | POA: Diagnosis not present

## 2024-08-17 DIAGNOSIS — Z23 Encounter for immunization: Secondary | ICD-10-CM | POA: Insufficient documentation

## 2024-08-17 DIAGNOSIS — S80212A Abrasion, left knee, initial encounter: Secondary | ICD-10-CM | POA: Diagnosis not present

## 2024-08-17 DIAGNOSIS — S022XXB Fracture of nasal bones, initial encounter for open fracture: Secondary | ICD-10-CM | POA: Diagnosis not present

## 2024-08-17 DIAGNOSIS — S299XXA Unspecified injury of thorax, initial encounter: Secondary | ICD-10-CM | POA: Insufficient documentation

## 2024-08-17 DIAGNOSIS — S01112A Laceration without foreign body of left eyelid and periocular area, initial encounter: Secondary | ICD-10-CM | POA: Diagnosis not present

## 2024-08-17 DIAGNOSIS — Z7901 Long term (current) use of anticoagulants: Secondary | ICD-10-CM | POA: Insufficient documentation

## 2024-08-17 DIAGNOSIS — S80211A Abrasion, right knee, initial encounter: Secondary | ICD-10-CM | POA: Insufficient documentation

## 2024-08-17 DIAGNOSIS — Y9301 Activity, walking, marching and hiking: Secondary | ICD-10-CM | POA: Insufficient documentation

## 2024-08-17 DIAGNOSIS — S0181XA Laceration without foreign body of other part of head, initial encounter: Secondary | ICD-10-CM | POA: Insufficient documentation

## 2024-08-17 DIAGNOSIS — S3991XA Unspecified injury of abdomen, initial encounter: Secondary | ICD-10-CM | POA: Diagnosis not present

## 2024-08-17 DIAGNOSIS — S12691A Other nondisplaced fracture of seventh cervical vertebra, initial encounter for closed fracture: Secondary | ICD-10-CM | POA: Diagnosis not present

## 2024-08-17 DIAGNOSIS — W19XXXA Unspecified fall, initial encounter: Secondary | ICD-10-CM

## 2024-08-17 DIAGNOSIS — S0992XA Unspecified injury of nose, initial encounter: Secondary | ICD-10-CM | POA: Diagnosis present

## 2024-08-17 LAB — CBC WITH DIFFERENTIAL/PLATELET
Abs Immature Granulocytes: 0.03 K/uL (ref 0.00–0.07)
Basophils Absolute: 0 K/uL (ref 0.0–0.1)
Basophils Relative: 1 %
Eosinophils Absolute: 0.1 K/uL (ref 0.0–0.5)
Eosinophils Relative: 1 %
HCT: 38 % — ABNORMAL LOW (ref 39.0–52.0)
Hemoglobin: 12.7 g/dL — ABNORMAL LOW (ref 13.0–17.0)
Immature Granulocytes: 1 %
Lymphocytes Relative: 41 %
Lymphs Abs: 2.4 K/uL (ref 0.7–4.0)
MCH: 32.6 pg (ref 26.0–34.0)
MCHC: 33.4 g/dL (ref 30.0–36.0)
MCV: 97.7 fL (ref 80.0–100.0)
Monocytes Absolute: 0.5 K/uL (ref 0.1–1.0)
Monocytes Relative: 8 %
Neutro Abs: 2.8 K/uL (ref 1.7–7.7)
Neutrophils Relative %: 48 %
Platelets: 188 K/uL (ref 150–400)
RBC: 3.89 MIL/uL — ABNORMAL LOW (ref 4.22–5.81)
RDW: 13.3 % (ref 11.5–15.5)
WBC: 5.8 K/uL (ref 4.0–10.5)
nRBC: 0 % (ref 0.0–0.2)

## 2024-08-17 LAB — COMPREHENSIVE METABOLIC PANEL WITH GFR
ALT: 19 U/L (ref 0–44)
AST: 23 U/L (ref 15–41)
Albumin: 3.6 g/dL (ref 3.5–5.0)
Alkaline Phosphatase: 109 U/L (ref 38–126)
Anion gap: 11 (ref 5–15)
BUN: 25 mg/dL — ABNORMAL HIGH (ref 8–23)
CO2: 22 mmol/L (ref 22–32)
Calcium: 8.7 mg/dL — ABNORMAL LOW (ref 8.9–10.3)
Chloride: 101 mmol/L (ref 98–111)
Creatinine, Ser: 0.97 mg/dL (ref 0.61–1.24)
GFR, Estimated: >60 mL/min (ref >60)
Glucose, Bld: 112 mg/dL — ABNORMAL HIGH (ref 70–99)
Potassium: 4.3 mmol/L (ref 3.5–5.1)
Sodium: 134 mmol/L — ABNORMAL LOW (ref 135–145)
Total Bilirubin: 0.8 mg/dL (ref 0.0–1.2)
Total Protein: 6.6 g/dL (ref 6.5–8.1)

## 2024-08-17 LAB — I-STAT CHEM 8, ED
BUN: 31 mg/dL — ABNORMAL HIGH (ref 8–23)
Calcium, Ion: 1.04 mmol/L — ABNORMAL LOW (ref 1.15–1.40)
Chloride: 104 mmol/L (ref 98–111)
Creatinine, Ser: 1 mg/dL (ref 0.61–1.24)
Glucose, Bld: 110 mg/dL — ABNORMAL HIGH (ref 70–99)
HCT: 37 % — ABNORMAL LOW (ref 39.0–52.0)
Hemoglobin: 12.6 g/dL — ABNORMAL LOW (ref 13.0–17.0)
Potassium: 4.5 mmol/L (ref 3.5–5.1)
Sodium: 136 mmol/L (ref 135–145)
TCO2: 24 mmol/L (ref 22–32)

## 2024-08-17 LAB — PROTIME-INR
INR: 1.1 (ref 0.8–1.2)
Prothrombin Time: 14.7 s (ref 11.4–15.2)

## 2024-08-17 LAB — I-STAT CG4 LACTIC ACID, ED: Lactic Acid, Venous: 1.3 mmol/L (ref 0.5–1.9)

## 2024-08-17 MED ORDER — OXYMETAZOLINE HCL 0.05 % NA SOLN
2.0000 | Freq: Once | NASAL | Status: AC
  Administered 2024-08-17: 2 via NASAL
  Filled 2024-08-17: qty 30

## 2024-08-17 MED ORDER — TETANUS-DIPHTH-ACELL PERTUSSIS 5-2-15.5 LF-MCG/0.5 IM SUSP
0.5000 mL | Freq: Once | INTRAMUSCULAR | Status: AC
  Administered 2024-08-17: 0.5 mL via INTRAMUSCULAR
  Filled 2024-08-17: qty 0.5

## 2024-08-17 MED ORDER — LIDOCAINE-EPINEPHRINE (PF) 2 %-1:200000 IJ SOLN
20.0000 mL | Freq: Once | INTRAMUSCULAR | Status: AC
  Administered 2024-08-17: 20 mL
  Filled 2024-08-17: qty 20

## 2024-08-17 MED ORDER — IOHEXOL 350 MG/ML SOLN
75.0000 mL | Freq: Once | INTRAVENOUS | Status: AC | PRN
  Administered 2024-08-17: 75 mL via INTRAVENOUS

## 2024-08-17 MED ORDER — CEPHALEXIN 500 MG PO CAPS: 500.0000 mg | ORAL_CAPSULE | Freq: Two times a day (BID) | ORAL | 0 refills | Status: AC

## 2024-08-17 NOTE — ED Triage Notes (Addendum)
 Pt BIB [facility] from [facility]. Pt was trying to go to car and tripped. Hit head on pavement. 2 cm Laceration to left eye brow two to bridge of nose bleeding from left nare and deformity noted. Laceration on lip. Skin tear to left knee. C/O neck pain that travels down spine. On eliquis  due to multiple TIA. No LOC. No headache or blurred vision.  EMS VS 160/74 68 P 18 RR 99 RA

## 2024-08-17 NOTE — ED Provider Notes (Signed)
 [place] EMERGENCY DEPARTMENT AT [facility] Provider Note   CSN: [id number] Arrival date & time: 08/17/24  1624     Patient presents with: [name] is a 83 y.o. male history of A-fib, TIA, anticoagulated here for evaluation of trip and fall.  Was walking on pavement, tripped subsequently falling on his face.  He has abrasions to his bilateral knees.  He admits to some neck pain.  He denies any LOC.  No chest pain, abdominal pain, numbness or weakness.  Significantly hard of hearing, not wearing hearing aids.   HPI     Prior to Admission medications   Medication Sig Start Date End Date Taking? Authorizing Provider  cephALEXin (KEFLEX) 500 MG capsule Take 1 capsule (500 mg total) by mouth 2 (two) times daily for 7 days. 08/17/24 08/24/24 Yes Sanel Stemmer A, PA-C  acetaminophen  (TYLENOL ) 325 MG tablet Take 2 tablets (650 mg total) by mouth every 6 (six) hours as needed. 03/04/22   Elnor Jayson LABOR, DO  albuterol  (VENTOLIN  HFA) 108 (90 Base) MCG/ACT inhaler Inhale 2 puffs into the lungs 4 (four) times daily as needed for wheezing or shortness of breath. 01/28/24   [place], Betty G, MD  amLODipine  (NORVASC ) 5 MG tablet Take 1 tablet (5 mg total) by mouth daily. 04/18/24   [place], Betty G, MD  ARNUITY ELLIPTA  200 MCG/ACT AEPB INHALE 1 PUFF INTO THE LUNGS EVERY MORNING. 05/19/24   [place], Betty G, MD  atorvastatin  (LIPITOR) 40 MG tablet TAKE 1 TABLET BY MOUTH EVERY DAY 03/25/24   [place], Betty G, MD  Calcium  250 MG CAPS Take 1 capsule by mouth daily.    [provider]  cholecalciferol  (VITAMIN D ) 25 MCG (1000 UNIT) tablet Take 1,000 Units by mouth at bedtime.    [provider]  docusate sodium  (COLACE) 50 MG capsule Take 100 mg by mouth 2 (two) times daily.    [provider]  ELIQUIS  5 MG TABS tablet TAKE 1 TABLET BY MOUTH TWICE A DAY 06/25/24   [place], Betty G, MD  Ferrous Sulfate  (IRON) 325 (65 Fe) MG TABS Take 1 tablet by mouth at bedtime.     [provider]  Magnesium  250 MG TABS Take 250 mg by mouth every morning.    [provider]  Multiple Vitamin (MULTIVITAMIN WITH MINERALS) TABS tablet Take 1 tablet by mouth every morning.    [provider]  Omega-3 Fatty Acids (FISH OIL PO) Take 1 capsule by mouth every morning.    [provider]  omeprazole  (PRILOSEC) 40 MG capsule Take 1 capsule (40 mg total) by mouth 2 (two) times daily. 05/20/24   Heinz, Camie BRAVO, PA-C  TIMOLOL  MALEATE OP Place 1 drop into both eyes in the morning. 1 drop into both eyes every morning    [provider]    Allergies: Patient has no known allergies.    Review of Systems  Constitutional: Negative.   HENT: Negative.    Respiratory: Negative.    Cardiovascular: Negative.   Gastrointestinal: Negative.   Genitourinary: Negative.   Musculoskeletal:  Positive for neck pain.  Skin:  Positive for wound.  Neurological: Negative.   All other systems reviewed and are negative.   Updated Vital Signs BP (!) 144/62   Pulse 65   Temp (!) 97.5 F (36.4 C) (Oral)   Resp 17   Ht 5' 6 (1.676 m)   Wt 70 kg   SpO2 100%   BMI 24.91  kg/m   Physical Exam Vitals and nursing note reviewed.  Constitutional:      General: He is not in acute distress.    Appearance: He is well-developed. He is not ill-appearing, toxic-appearing or diaphoretic.  HENT:     Head: Normocephalic. Abrasion, contusion and laceration present.     Jaw: There is normal jaw occlusion.      Comments: 3 cm laceration left eye brow. 1 cm laceration nasal bridge. 1 cm laceration left eyebrow No drooling, dysphagia or trismus.  Nontender facial bones.    Ears:     Comments: No hemotympanum    Nose:      Comments: Dried epistaxis    Mouth/Throat:     Lips: Pink.     Mouth: Mucous membranes are moist.     Pharynx: Oropharynx is clear. Uvula midline.     Comments: No loose dentition Eyes:     Extraocular Movements: Extraocular movements  intact.     Conjunctiva/sclera: Conjunctivae normal.     Pupils: Pupils are equal, round, and reactive to light.     Comments: PERRLA, no traumatic hyphema  Neck:     Trachea: Trachea and phonation normal.     Comments: C collar present Cardiovascular:     Rate and Rhythm: Normal rate and regular rhythm.     Pulses: Normal pulses.          Radial pulses are 2+ on the right side and 2+ on the left side.       Dorsalis pedis pulses are 2+ on the right side.     Heart sounds: Normal heart sounds.  Pulmonary:     Effort: Pulmonary effort is normal. No respiratory distress.     Breath sounds: Normal breath sounds and air entry.     Comments: Clear bilaterally, speaks in full sentences without difficulty Chest:     Comments: Mild tenderness anterior chest wall where c-collar was resting Abdominal:     General: Bowel sounds are normal. There is no distension.     Palpations: Abdomen is soft.     Tenderness: There is no abdominal tenderness.     Comments: Soft, nontender  Musculoskeletal:        General: Normal range of motion.     Comments: Diffuse tenderness midline lumbar region.  Nontender bilateral upper extremities.  Lifts arms overhead without difficulty.  Pelvis stable, nontender.  Nontender bilateral femurs, tib-fib, feet.  Diffuse tenderness of bilateral knees with overlying abrasion  Skin:    General: Skin is warm and dry.     Capillary Refill: Capillary refill takes less than 2 seconds.     Comments: Multiple Lacerations face.  Abrasions bilateral anterior knees over patella, no lacerations to suture  Neurological:     General: No focal deficit present.     Mental Status: He is alert and oriented to person, place, and time.     Cranial Nerves: Cranial nerves 2-12 are intact.     Sensory: Sensation is intact.     Motor: Motor function is intact.     Comments: Cranial nerves II through XII grossly intact Equal strength bilaterally Intact sensation bilaterally Ambulatory with  walker- baseline     (all labs ordered are listed, but only abnormal results are displayed) Labs Reviewed  CBC WITH DIFFERENTIAL/PLATELET - Abnormal; Notable for the following components:      Result Value   RBC 3.89 (*)    Hemoglobin 12.7 (*)    HCT 38.0 (*)  All other components within normal limits  COMPREHENSIVE METABOLIC PANEL WITH GFR - Abnormal; Notable for the following components:   Sodium 134 (*)    Glucose, Bld 112 (*)    BUN 25 (*)    Calcium  8.7 (*)    All other components within normal limits  I-STAT CHEM 8, ED - Abnormal; Notable for the following components:   BUN 31 (*)    Glucose, Bld 110 (*)    Calcium , Ion 1.04 (*)    Hemoglobin 12.6 (*)    HCT 37.0 (*)    All other components within normal limits  PROTIME-INR  URINALYSIS, W/ REFLEX TO CULTURE (INFECTION SUSPECTED)  I-STAT CG4 LACTIC ACID, ED    EKG: None  Radiology: CT T-SPINE NO CHARGE Result Date: 08/17/2024 CLINICAL DATA:  Fall EXAM: CT Thoracic and Lumbar spine with no additional contrast TECHNIQUE: Multiplanar CT images of the thoracic and lumbar spine were reconstructed from contemporary CT of the Chest, Abdomen, and Pelvis. RADIATION DOSE REDUCTION: This exam was performed according to the departmental dose-optimization program which includes automated exposure control, adjustment of the mA and/or kV according to patient size and/or use of iterative reconstruction technique. CONTRAST:  No additional COMPARISON:  None Available. FINDINGS: CT THORACIC SPINE FINDINGS Alignment: Compensatory mild dextrocurvature of the mid lower thoracic spine. Vertebrae: Diffusely decreased bone density. Multilevel mild-to-moderate degenerative changes of the spine. Densely sclerotic lesion within the T11 vertebral body likely a bone island. Chronic appearing vertebral body height loss of the T9 level. No definite acute displaced fracture or focal pathologic process. Paraspinal and other soft tissues: Nonspecific  calcification along the paravertebral soft tissues at the level of the left T12 spinous process (8:38). Disc levels: Multilevel intervertebral disc space narrowing. CT LUMBAR SPINE FINDINGS Segmentation: 5 lumbar type vertebrae. Alignment: Levoscoliosis of the lumbar spine centered at the L2-L3 level. Vertebrae: Diffusely decreased bone density. Partial fusion of the L2-L3 vertebral bodies. Severe degenerative changes. Associated multilevel severe osseous neural foraminal stenosis. No severe osseous central canal stenosis. No acute fracture or focal pathologic process. Paraspinal and other soft tissues: Negative. Disc levels: Multilevel intervertebral disc space narrowing and vacuum phenomenon. IMPRESSION: 1. Chronic appearing vertebral body height loss of the T9 level. Correlate with point tenderness to palpation to evaluate for an acute component. 2. No acute displaced fracture or traumatic listhesis of the thoracolumbar spine. Electronically Signed   By: Morgane  Naveau M.D.   On: 08/17/2024 18:11   CT L-SPINE NO CHARGE Result Date: 08/17/2024 CLINICAL DATA:  Fall EXAM: CT Thoracic and Lumbar spine with no additional contrast TECHNIQUE: Multiplanar CT images of the thoracic and lumbar spine were reconstructed from contemporary CT of the Chest, Abdomen, and Pelvis. RADIATION DOSE REDUCTION: This exam was performed according to the departmental dose-optimization program which includes automated exposure control, adjustment of the mA and/or kV according to patient size and/or use of iterative reconstruction technique. CONTRAST:  No additional COMPARISON:  None Available. FINDINGS: CT THORACIC SPINE FINDINGS Alignment: Compensatory mild dextrocurvature of the mid lower thoracic spine. Vertebrae: Diffusely decreased bone density. Multilevel mild-to-moderate degenerative changes of the spine. Densely sclerotic lesion within the T11 vertebral body likely a bone island. Chronic appearing vertebral body height loss of  the T9 level. No definite acute displaced fracture or focal pathologic process. Paraspinal and other soft tissues: Nonspecific calcification along the paravertebral soft tissues at the level of the left T12 spinous process (8:38). Disc levels: Multilevel intervertebral disc space narrowing. CT LUMBAR SPINE FINDINGS Segmentation: 5  lumbar type vertebrae. Alignment: Levoscoliosis of the lumbar spine centered at the L2-L3 level. Vertebrae: Diffusely decreased bone density. Partial fusion of the L2-L3 vertebral bodies. Severe degenerative changes. Associated multilevel severe osseous neural foraminal stenosis. No severe osseous central canal stenosis. No acute fracture or focal pathologic process. Paraspinal and other soft tissues: Negative. Disc levels: Multilevel intervertebral disc space narrowing and vacuum phenomenon. IMPRESSION: 1. Chronic appearing vertebral body height loss of the T9 level. Correlate with point tenderness to palpation to evaluate for an acute component. 2. No acute displaced fracture or traumatic listhesis of the thoracolumbar spine. Electronically Signed   By: Morgane  Naveau M.D.   On: 08/17/2024 18:11   CT CHEST ABDOMEN PELVIS W CONTRAST Result Date: 08/17/2024 CLINICAL DATA:  Polytrauma, blunt . Pt was trying to go to car and tripped. Hit head on pavement. 2 cm Laceration to left eye brow two to bridge. Fall EXAM: CT CHEST, ABDOMEN, AND PELVIS WITH CONTRAST TECHNIQUE: Multidetector CT imaging of the chest, abdomen and pelvis was performed following the standard protocol during bolus administration of intravenous contrast. RADIATION DOSE REDUCTION: This exam was performed according to the departmental dose-optimization program which includes automated exposure control, adjustment of the mA and/or kV according to patient size and/or use of iterative reconstruction technique. CONTRAST:  75mL OMNIPAQUE  IOHEXOL  350 MG/ML SOLN COMPARISON:  CT abdomen 01/23/2024 FINDINGS: CHEST: Cardiovascular:  Left chest wall dual lead pacemaker. No aortic injury. The thoracic aorta is normal in caliber. The heart is normal in size. No significant pericardial effusion. Atherosclerotic plaque. Mediastinum/Nodes: No pneumomediastinum. No mediastinal hematoma. The esophagus is unremarkable. At least moderate volume hiatal hernia. Enlarged heterogeneous right thyroid gland. Left thyroid gland likely surgically removed. The central airways are patent. No mediastinal, hilar, or axillary lymphadenopathy. Lungs/Pleura: Bilateral atelectasis. No focal consolidation. Right upper lobe micronodule (7:77). Vague right upper lobe 5 mm ground-glass airspace opacity (7:55). No pulmonary mass. No pulmonary contusion or laceration. No pneumatocele formation. No pleural effusion. No pneumothorax. No hemothorax. Musculoskeletal/Chest wall: No chest wall mass. Diffusely decreased bone density. No acute rib or sternal fracture. Please see separately dictated CT thoracolumbar spine. ABDOMEN / PELVIS: Hepatobiliary: Not enlarged. No focal lesion. No laceration or subcapsular hematoma. The gallbladder is otherwise unremarkable with no radio-opaque gallstones. No biliary ductal dilatation. Pancreas: Normal pancreatic contour. No main pancreatic duct dilatation. Spleen: Not enlarged. No focal lesion. No laceration, subcapsular hematoma, or vascular injury. Adrenals/Urinary Tract: No nodularity bilaterally. Bilateral kidneys enhance symmetrically. No hydronephrosis. No contusion, laceration, or subcapsular hematoma. No injury to the vascular structures or collecting systems. No hydroureter. The urinary bladder is unremarkable. Stomach/Bowel: No small or large bowel wall thickening or dilatation. The appendix is unremarkable. Vasculature/Lymphatics: Atherosclerotic plaque. No abdominal aorta or iliac aneurysm. No active contrast extravasation or pseudoaneurysm. No abdominal, pelvic, inguinal lymphadenopathy. Reproductive: Normal. Other: No simple  free fluid ascites. No pneumoperitoneum. No hemoperitoneum. No mesenteric hematoma identified. No organized fluid collection. Musculoskeletal: No significant soft tissue hematoma. Diffusely decreased bone density. No acute pelvic fracture. Please see separately dictated CT thoracolumbar spine. Other ports and devices: None. IMPRESSION: 1. No acute intrathoracic, intra-abdominal, intrapelvic traumatic injury. 2. No acute pelvic fracture. Please see separately dictated CT thoracolumbar spine. Other imaging findings of potential clinical significance: 1. At least moderate volume hiatal hernia. 2. Enlarged heterogeneous right thyroid gland. In the setting of significant comorbidities or limited life expectancy, no follow-up recommended (ref: J Am Coll Radiol. 2015 Feb;12(2): 143-50). 3.  Aortic Atherosclerosis (ICD10-I70.0). 4. Right upper lobe 5  mm ground-glass pulmonary nodule. No follow-up recommended. This recommendation follows the consensus statement: Guidelines for Management of Incidental Pulmonary Nodules Detected on CT Images: From the Fleischner Society 2017; Radiology 2017; 284:228-243. These results were called by telephone at the time of interpretation on 08/17/2024 at 5:31 Pm to provider [facility] , who verbally acknowledged these results. Electronically Signed   By: Morgane  Naveau M.D.   On: 08/17/2024 18:06   DG Knee Complete 4 Views Left Result Date: 08/17/2024 CLINICAL DATA:  fall EXAM: LEFT KNEE - COMPLETE 4+ VIEW COMPARISON:  None Available. FINDINGS: Osteopenia.No acute fracture or dislocation. Moderate joint effusion. Moderate tricompartmental joint space loss of the knee with osteophyte formation. Fairly pronounced chondrocalcinosis. Peripheral vascular atherosclerosis. IMPRESSION: 1. Osteopenia. Moderate joint effusion. No acute fracture or dislocation. 2. 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 Electronically Signed   By: Rogelia Myers M.D.   On: 08/17/2024 18:02   DG Knee Complete 4 Views Right Result Date: 08/17/2024 CLINICAL DATA:  fall EXAM: RIGHT KNEE - COMPLETE 4+ VIEW COMPARISON:  None Available. FINDINGS: Osteopenia.No acute fracture or dislocation. Small joint effusion. Moderate tricompartmental joint space loss of the knee with osteophyte formation. Fairly pronounced chondrocalcinosis. Peripheral vascular atherosclerosis. IMPRESSION: 1. Osteopenia.  No acute fracture or dislocation. 2. 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 Electronically Signed   By: Rogelia Myers M.D.   On: 08/17/2024 18:01   CT Head Wo Contrast Result Date: 08/17/2024 CLINICAL DATA:  Head trauma, moderate-severe; Facial trauma, blunt; fall EXAM: CT HEAD WITHOUT CONTRAST CT MAXILLOFACIAL WITHOUT CONTRAST CT CERVICAL SPINE WITHOUT CONTRAST TECHNIQUE: Multidetector CT imaging of the head, cervical spine, and maxillofacial structures were performed using the standard protocol without intravenous contrast. Multiplanar CT image reconstructions of the cervical spine and maxillofacial structures were also generated. RADIATION DOSE REDUCTION: This exam was performed according to the departmental dose-optimization program which includes automated exposure control, adjustment of the mA and/or kV according to patient size and/or use of iterative reconstruction technique. COMPARISON:  CT head 03/20/2024, CT C-spine 08/12/2023 FINDINGS: CT HEAD FINDINGS Brain: Cerebral ventricle sizes are concordant with the degree of cerebral volume loss. Patchy and confluent areas of decreased attenuation are noted throughout the deep and periventricular white matter of the cerebral hemispheres bilaterally, compatible with chronic microvascular ischemic disease. No evidence of  large-territorial acute infarction. No parenchymal hemorrhage. No mass lesion. No extra-axial collection. No mass effect or midline shift. No hydrocephalus. Basilar cisterns are patent. Vascular: No hyperdense vessel. Skull: No acute fracture or focal lesion. Other: Left frontal scalp hematoma. No retained radiopaque foreign body. CT MAXILLOFACIAL FINDINGS Osseous: Interval development of likely acute nondisplaced right nasal bone fracture. No acute displaced fracture or mandibular dislocation. No destructive process. Sinuses/Orbits: Paranasal sinuses and mastoid air cells are clear. Bilateral lens replacement. Otherwise the orbits are unremarkable. Soft tissues: Paranasal subcutaneus soft tissue hematoma and subcutaneus soft tissue emphysema. CT CERVICAL SPINE FINDINGS Alignment: Stable reversal of the normal cervical lordosis centered at the C4-C6 levels. Stable grade 1 anterolisthesis of C4 on C5. Skull base and vertebrae: Similar-appearing severe degenerative changes of the spine with partial fusion of the C4 through C6 levels. Question acute fracture of an C7 superior endplate anterior osteophyte formation (4:96). No aggressive appearing focal osseous lesion or focal pathologic process. Soft tissues and spinal canal: Mild prevertebral edema  along the C6-C7 levels. No visible canal hematoma. Upper chest: Unremarkable. Other: Enlarged heterogeneous right thyroid gland. Left thyroid gland not visualized and likely surgically removed. IMPRESSION: 1. No acute intracranial abnormality. 2. Interval development of likely acute nondisplaced right nasal bone fracture. 3. Likely acute fracture of an C7 superior endplate anterior osteophyte formation. Recommend MRI of the cervical spine for further evaluation. 4. Enlarged heterogeneous right thyroid gland. In the setting of significant comorbidities or limited life expectancy, no follow-up recommended (ref: J Am Coll Radiol. 2015 Feb;12(2): 143-50). These results were  called by telephone at the time of interpretation on 08/17/2024 at 5:31 pm to provider [facility] , who verbally acknowledged these results. Electronically Signed   By: Morgane  Naveau M.D.   On: 08/17/2024 17:59   CT Maxillofacial Wo Contrast Result Date: 08/17/2024 CLINICAL DATA:  Head trauma, moderate-severe; Facial trauma, blunt; fall EXAM: CT HEAD WITHOUT CONTRAST CT MAXILLOFACIAL WITHOUT CONTRAST CT CERVICAL SPINE WITHOUT CONTRAST TECHNIQUE: Multidetector CT imaging of the head, cervical spine, and maxillofacial structures were performed using the standard protocol without intravenous contrast. Multiplanar CT image reconstructions of the cervical spine and maxillofacial structures were also generated. RADIATION DOSE REDUCTION: This exam was performed according to the departmental dose-optimization program which includes automated exposure control, adjustment of the mA and/or kV according to patient size and/or use of iterative reconstruction technique. COMPARISON:  CT head 03/20/2024, CT C-spine 08/12/2023 FINDINGS: CT HEAD FINDINGS Brain: Cerebral ventricle sizes are concordant with the degree of cerebral volume loss. Patchy and confluent areas of decreased attenuation are noted throughout the deep and periventricular white matter of the cerebral hemispheres bilaterally, compatible with chronic microvascular ischemic disease. No evidence of large-territorial acute infarction. No parenchymal hemorrhage. No mass lesion. No extra-axial collection. No mass effect or midline shift. No hydrocephalus. Basilar cisterns are patent. Vascular: No hyperdense vessel. Skull: No acute fracture or focal lesion. Other: Left frontal scalp hematoma. No retained radiopaque foreign body. CT MAXILLOFACIAL FINDINGS Osseous: Interval development of likely acute nondisplaced right nasal bone fracture. No acute displaced fracture or mandibular dislocation. No destructive process. Sinuses/Orbits: Paranasal sinuses and mastoid  air cells are clear. Bilateral lens replacement. Otherwise the orbits are unremarkable. Soft tissues: Paranasal subcutaneus soft tissue hematoma and subcutaneus soft tissue emphysema. CT CERVICAL SPINE FINDINGS Alignment: Stable reversal of the normal cervical lordosis centered at the C4-C6 levels. Stable grade 1 anterolisthesis of C4 on C5. Skull base and vertebrae: Similar-appearing severe degenerative changes of the spine with partial fusion of the C4 through C6 levels. Question acute fracture of an C7 superior endplate anterior osteophyte formation (4:96). No aggressive appearing focal osseous lesion or focal pathologic process. Soft tissues and spinal canal: Mild prevertebral edema along the C6-C7 levels. No visible canal hematoma. Upper chest: Unremarkable. Other: Enlarged heterogeneous right thyroid gland. Left thyroid gland not visualized and likely surgically removed. IMPRESSION: 1. No acute intracranial abnormality. 2. Interval development of likely acute nondisplaced right nasal bone fracture. 3. Likely acute fracture of an C7 superior endplate anterior osteophyte formation. Recommend MRI of the cervical spine for further evaluation. 4. Enlarged heterogeneous right thyroid gland. In the setting of significant comorbidities or limited life expectancy, no follow-up recommended (ref: J Am Coll Radiol. 2015 Feb;12(2): 143-50). These results were called by telephone at the time of interpretation on 08/17/2024 at 5:31 pm to provider [facility] , who verbally acknowledged these results. Electronically Signed   By: Morgane  Naveau M.D.   On: 08/17/2024 17:59   CT Cervical Spine Wo  Contrast Result Date: 08/17/2024 CLINICAL DATA:  Head trauma, moderate-severe; Facial trauma, blunt; fall EXAM: CT HEAD WITHOUT CONTRAST CT MAXILLOFACIAL WITHOUT CONTRAST CT CERVICAL SPINE WITHOUT CONTRAST TECHNIQUE: Multidetector CT imaging of the head, cervical spine, and maxillofacial structures were performed using the  standard protocol without intravenous contrast. Multiplanar CT image reconstructions of the cervical spine and maxillofacial structures were also generated. RADIATION DOSE REDUCTION: This exam was performed according to the departmental dose-optimization program which includes automated exposure control, adjustment of the mA and/or kV according to patient size and/or use of iterative reconstruction technique. COMPARISON:  CT head 03/20/2024, CT C-spine 08/12/2023 FINDINGS: CT HEAD FINDINGS Brain: Cerebral ventricle sizes are concordant with the degree of cerebral volume loss. Patchy and confluent areas of decreased attenuation are noted throughout the deep and periventricular white matter of the cerebral hemispheres bilaterally, compatible with chronic microvascular ischemic disease. No evidence of large-territorial acute infarction. No parenchymal hemorrhage. No mass lesion. No extra-axial collection. No mass effect or midline shift. No hydrocephalus. Basilar cisterns are patent. Vascular: No hyperdense vessel. Skull: No acute fracture or focal lesion. Other: Left frontal scalp hematoma. No retained radiopaque foreign body. CT MAXILLOFACIAL FINDINGS Osseous: Interval development of likely acute nondisplaced right nasal bone fracture. No acute displaced fracture or mandibular dislocation. No destructive process. Sinuses/Orbits: Paranasal sinuses and mastoid air cells are clear. Bilateral lens replacement. Otherwise the orbits are unremarkable. Soft tissues: Paranasal subcutaneus soft tissue hematoma and subcutaneus soft tissue emphysema. CT CERVICAL SPINE FINDINGS Alignment: Stable reversal of the normal cervical lordosis centered at the C4-C6 levels. Stable grade 1 anterolisthesis of C4 on C5. Skull base and vertebrae: Similar-appearing severe degenerative changes of the spine with partial fusion of the C4 through C6 levels. Question acute fracture of an C7 superior endplate anterior osteophyte formation (4:96). No  aggressive appearing focal osseous lesion or focal pathologic process. Soft tissues and spinal canal: Mild prevertebral edema along the C6-C7 levels. No visible canal hematoma. Upper chest: Unremarkable. Other: Enlarged heterogeneous right thyroid gland. Left thyroid gland not visualized and likely surgically removed. IMPRESSION: 1. No acute intracranial abnormality. 2. Interval development of likely acute nondisplaced right nasal bone fracture. 3. Likely acute fracture of an C7 superior endplate anterior osteophyte formation. Recommend MRI of the cervical spine for further evaluation. 4. Enlarged heterogeneous right thyroid gland. In the setting of significant comorbidities or limited life expectancy, no follow-up recommended (ref: J Am Coll Radiol. 2015 Feb;12(2): 143-50). These results were called by telephone at the time of interpretation on 08/17/2024 at 5:31 pm to provider [facility] , who verbally acknowledged these results. Electronically Signed   By: Morgane  Naveau M.D.   On: 08/17/2024 17:59   DG Chest Portable 1 View Result Date: 08/17/2024 CLINICAL DATA:  fall EXAM: PORTABLE CHEST - 1 VIEW COMPARISON:  May 27, 2024 FINDINGS: Low lung volumes. Large hiatal hernia. No focal airspace consolidation, pleural effusion, or pneumothorax. No cardiomegaly. Left chest pacemaker with leads terminating in the right atrium and right ventricle. Aortic atherosclerosis. No acute fracture or destructive lesions. Multilevel thoracic osteophytosis. IMPRESSION: Low lung volumes.  No acute cardiopulmonary abnormality. Electronically Signed   By: Rogelia Myers M.D.   On: 08/17/2024 17:05   DG Pelvis 1-2 Views Result Date: 08/17/2024 CLINICAL DATA:  fall EXAM: PELVIS - 1-2 VIEW COMPARISON:  January 23, 2024 FINDINGS: Osteopenia.No evidence of pelvic fracture or diastasis.No acute hip fracture or dislocation.Mild bilateral hip osteoarthritis. Soft tissues are unremarkable. Aortoiliac atherosclerosis. IMPRESSION: No  acute fracture, pelvic bone diastasis,  or dislocation. Electronically Signed   By: Rogelia Myers M.D.   On: 08/17/2024 17:03     .Laceration Repair  Date/Time: 08/17/2024 8:03 PM  Performed by: Edie Einstein A, PA-C Authorized by: Edie Einstein LABOR, PA-C   Consent:    Consent obtained:  Verbal   Consent given by:  Patient and spouse   Risks, benefits, and alternatives were discussed: yes     Risks discussed:  Pain, retained foreign body, infection, need for additional repair, poor cosmetic result, vascular damage, poor wound healing, nerve damage and tendon damage   Alternatives discussed:  No treatment, delayed treatment, observation and referral Universal protocol:    Procedure explained and questions answered to patient or proxy's satisfaction: yes     Relevant documents present and verified: yes     Test results available: yes     Imaging studies available: yes     Required blood products, implants, devices, and special equipment available: yes     Site/side marked: yes     Immediately prior to procedure, a time out was called: yes     Patient identity confirmed:  Verbally with patient Anesthesia:    Anesthesia method:  Local infiltration   Local anesthetic:  Lidocaine  1% w/o epi Laceration details:    Location:  Face   Face location:  Forehead   Length (cm):  3   Depth (mm):  4 Pre-procedure details:    Preparation:  Patient was prepped and draped in usual sterile fashion and imaging obtained to evaluate for foreign bodies Exploration:    Limited defect created (wound extended): no     Hemostasis achieved with:  Direct pressure   Imaging obtained comment:  CT   Imaging outcome: foreign body not noted     Wound exploration: wound explored through full range of motion and entire depth of wound visualized     Wound extent: no foreign body, no signs of injury, no tendon damage, no underlying fracture and no vascular damage   Treatment:    Area cleansed with:   Povidone-iodine   Amount of cleaning:  Standard   Irrigation solution:  Sterile saline   Irrigation method:  Pressure wash Skin repair:    Repair method:  Sutures   Suture size:  5-0   Suture material:  Prolene   Suture technique:  Simple interrupted   Number of sutures:  5 Approximation:    Approximation:  Close Repair type:    Repair type:  Intermediate Post-procedure details:    Dressing:  Non-adherent dressing   Procedure completion:  Tolerated well, no immediate complications .Laceration Repair  Date/Time: 08/17/2024 8:22 PM  Performed by: Edie Einstein LABOR, PA-C Authorized by: Edie Einstein LABOR, PA-C   Consent:    Consent obtained:  Verbal   Consent given by:  Patient   Risks, benefits, and alternatives were discussed: yes     Risks discussed:  Infection, pain, retained foreign body, tendon damage, vascular damage, poor wound healing, poor cosmetic result, need for additional repair and nerve damage   Alternatives discussed:  No treatment, delayed treatment, observation and referral Universal protocol:    Procedure explained and questions answered to patient or proxy's satisfaction: yes     Relevant documents present and verified: yes     Test results available: yes     Imaging studies available: yes     Required blood products, implants, devices, and special equipment available: yes     Site/side marked: yes     Immediately  prior to procedure, a time out was called: yes     Patient identity confirmed:  Verbally with patient Anesthesia:    Anesthesia method:  Local infiltration   Local anesthetic:  Lidocaine  1% w/o epi Laceration details:    Location:  Face   Face location:  L eyebrow   Length (cm):  0.8 Pre-procedure details:    Preparation:  Patient was prepped and draped in usual sterile fashion and imaging obtained to evaluate for foreign bodies Exploration:    Hemostasis achieved with:  Direct pressure   Imaging obtained comment:  CT   Wound extent: no  foreign body, no signs of injury, no tendon damage, no underlying fracture and no vascular damage   Treatment:    Area cleansed with:  Povidone-iodine   Amount of cleaning:  Extensive   Irrigation solution:  Sterile saline Skin repair:    Repair method:  Sutures   Suture size:  5-0   Suture material:  Prolene   Suture technique:  Simple interrupted   Number of sutures:  1 Approximation:    Approximation:  Close Repair type:    Repair type:  Simple Post-procedure details:    Dressing:  Non-adherent dressing   Procedure completion:  Tolerated well, no immediate complications .Laceration Repair  Date/Time: 08/17/2024 8:23 PM  Performed by: Edie Rosebud LABOR, PA-C Authorized by: Edie Rosebud LABOR, PA-C   Consent:    Consent obtained:  Verbal   Consent given by:  Patient   Risks, benefits, and alternatives were discussed: yes     Risks discussed:  Infection, pain, retained foreign body, tendon damage, poor cosmetic result, need for additional repair, nerve damage, poor wound healing and vascular damage   Alternatives discussed:  No treatment, delayed treatment, observation and referral Universal protocol:    Procedure explained and questions answered to patient or proxy's satisfaction: yes     Relevant documents present and verified: yes     Test results available: yes     Imaging studies available: yes     Required blood products, implants, devices, and special equipment available: yes     Site/side marked: yes     Immediately prior to procedure, a time out was called: yes     Patient identity confirmed:  Verbally with patient Anesthesia:    Anesthesia method:  None Laceration details:    Location:  Face   Face location:  Nose   Length (cm):  0.8   Depth (mm):  3 Pre-procedure details:    Preparation:  Patient was prepped and draped in usual sterile fashion and imaging obtained to evaluate for foreign bodies Exploration:    Limited defect created (wound extended): no      Hemostasis achieved with:  Direct pressure   Imaging obtained comment:  CT   Wound extent: underlying fracture     Contaminated: no   Treatment:    Area cleansed with:  Povidone-iodine   Irrigation solution:  Sterile saline Skin repair:    Repair method:  Tissue adhesive Approximation:    Approximation:  Close Repair type:    Repair type:  Simple Post-procedure details:    Dressing:  Open (no dressing)   Procedure completion:  Tolerated well, no immediate complications .Ortho Injury Treatment  Date/Time: 08/17/2024 9:33 PM  Performed by: Edie Rosebud LABOR, PA-C Authorized by: Edie Rosebud LABOR, PA-C   Consent:    Consent obtained:  Verbal   Consent given by:  Patient and spouse   Risks discussed:  Fracture,  nerve damage, restricted joint movement, irreducible dislocation, recurrent dislocation, stiffness and vascular damage   Alternatives discussed:  No treatment, alternative treatment, immobilization, referral and delayed treatmentInjury location: spine Location details C7 Injury type: fracture Fracture type: vertebral body Pre-procedure neurovascular assessment: neurovascularly intact Pre-procedure distal perfusion: normal Pre-procedure neurological function: normal Pre-procedure range of motion: normal  Anesthesia: Local anesthesia used: no  Patient sedated: NoManipulation performed: no Cranial tongs applied: no Immobilization: brace (aspen collar) Splint type: aspen cercial collar. Splint Applied by: ED Nurse Post-procedure neurovascular assessment: post-procedure neurovascularly intact Post-procedure distal perfusion: normal Post-procedure neurological function: normal Post-procedure range of motion: normal      Medications Ordered in the ED  oxymetazoline (AFRIN) 0.05 % nasal spray 2 spray (2 sprays Each Nare Given 08/17/24 1820)  Tdap (ADACEL) injection 0.5 mL (0.5 mLs Intramuscular Given 08/17/24 1822)  lidocaine -EPINEPHrine  (XYLOCAINE  W/EPI) 2 %-1:200000  (PF) injection 20 mL (20 mLs Infiltration Given 08/17/24 1820)  iohexol  (OMNIPAQUE ) 350 MG/ML injection 75 mL (75 mLs Intravenous Contrast Given 08/17/24 4887)   83 year old on DOAC for A-fib here for evaluation mechanical fall just PTA.  On arrival he is alert and oriented however hard of hearing.  He is in pain to his head.  He has multiple lacerations to his face, abrasions to bilateral knees.  Typically walks with a walker.  At baseline mentation.  Level 2 trauma due to fall on anticoagulation.  He does not anything for pain.  Plan on labs, imaging, reassess neurovascular intact at this time.  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  He has total of 6 Prolene sutures to his facial lacerations as well as Dermabond.  Will have him follow-up outpatient for this.  Will discuss with neurosurgery as patient cannot have MRI due to his pacemaker tonight.  Clinical Course as of 08/17/24 2136  [place] Aug 17, 2024  1903 Discussed with neurosurgery Johnanna.  Patient cannot undergo MRI due to pacemaker.  She recommends Aspen collar, follow-up outpatient. Discussed with patient, significant other in room.  Will plan suturing head, ambulate [BH]    Clinical Course User Index [BH] Adelard Sanon A, PA-C   Patient reassessed.  Discussed workup with patient, family in room.  Will trial to ambulate.  Uses walker at baseline.  Can DC home if  ambulatory  Patient ambulated here with walker without difficulty.  Uses a walker at baseline.  Discussed results with patient, family in room.  Will have him follow-up with neurosurgery.  Given he did have an underlying nasal fracture with a superficial laceration he was started on Keflex for prophylaxis.  He will follow-up with PCP for suture removal.  Strict return precautions with patient and family in room.  Agreeable for outpatient follow-up.  Patient seen eval by attending agreeable to treatment, plan disposition  The patient has been appropriately medically screened and/or stabilized in the ED. I have low suspicion for any other emergent medical condition which would require further screening, evaluation or treatment in the ED or require inpatient management.  Patient is hemodynamically stable and in no acute distress.  Patient able to ambulate in department prior to ED.  Evaluation does not show acute pathology that would require ongoing or additional emergent interventions while in the emergency department or further inpatient treatment.  I have discussed the diagnosis with the patient and answered all questions.  Pain is been managed while in the emergency department and patient has no further complaints prior to discharge.  Patient is comfortable with plan discussed in room and is stable for discharge at this time.  I have discussed strict return precautions for returning to the emergency department.  Patient was encouraged to follow-up with PCP/specialist refer to at discharge.                                 Medical Decision Making Amount and/or Complexity of Data Reviewed Independent Historian: spouse and EMS External Data Reviewed: labs, radiology, ECG and notes. Labs: ordered. Decision-making details documented in ED Course. Radiology: ordered and independent interpretation performed. Decision-making details documented in ED Course. ECG/medicine tests: ordered and independent  interpretation performed. Decision-making details documented in ED Course.  Risk OTC drugs. Prescription drug management. Parenteral controlled substances. Decision regarding hospitalization. Diagnosis or treatment significantly limited by social determinants of health.        Final diagnoses:  Fall, initial encounter  Chronic anticoagulation  Facial laceration, initial encounter  Open fracture of nasal bone, initial encounter  Other closed nondisplaced fracture of seventh cervical vertebra, initial encounter [facility])    ED Discharge Orders          Ordered    cephALEXin (KEFLEX) 500 MG capsule  2 times daily        08/17/24 2131               Celestine Bougie A, PA-C 08/17/24 2136    Randol Simmonds, MD 08/20/24 [phone]

## 2024-08-17 NOTE — Discharge Instructions (Signed)
 It was a pleasure taking care of you here today  You have #6 sutures to your face.  These need to be removed in 1 week.  You also have some glue on your nose which should gradually flake off.  I have started you on antibiotics given the lacerations.  You also have a fracture of your neck.  Wear this brace at all times.  Make sure to follow-up with the neurosurgeon.  Call tomorrow and let them know you were seen here in the emergency department you are supposed to follow-up  May take Tylenol  as needed for pain  Return for any worsening symptoms

## 2024-08-17 NOTE — ED Notes (Signed)
 RN removed EMS collar with NT and [place] J c-collar was applied.

## 2024-08-17 NOTE — ED Notes (Signed)
 Pt ambulated in hall with assistance using walker. Pt had steady gait, complaining of only soreness. Pt stated his walking felt close to his normal.

## 2024-08-17 NOTE — ED Notes (Signed)
 Discharge instructions reviewed.   Newly prescribed medications discussed. Pharmacy verified.   Opportunity for questions and concerns provided.   Alert, oriented and escorted to familys vehicle via wheelchair.   Displays no signs of distress.   Encouraged to pick up prescriptiuon antibiotics as soon as possible. Schedule visit with PCP in 7 days for suture reevaluation / removal and contact neurosurgery asap for review of broken spine. Encouraged to wear c-collar 24/7.

## 2024-08-17 NOTE — Progress Notes (Signed)
 83 year old patient admitted for a head injury due to fall. After xrays, chaplain was able to introduce and chat with the patient. Patient was upbeat and felt he didn't need additional care. Chaplain acknowledged and told the patient that if he needed future chaplain services, to request the nurse to issue a page.

## 2024-08-17 NOTE — Progress Notes (Signed)
 Orthopedic Tech Progress Note Patient Details:  [name] 31-Dec-1940 [id number]  Patient ID: [name], male   DOB: 04/10/1941, 83 y.o.   MRN: [id number] Level II; not currently needed. [name] 08/17/2024, 5:07 PM

## 2024-08-17 NOTE — ED Notes (Signed)
 Trauma Response Nurse Documentation   [name] is a 83 y.o. male arriving to [facility] ED via EMS  On Eliquis  (apixaban ) daily. Trauma was activated as a Level 2 by ED Charge RN based on the following trauma criteria Elderly patients > 65 with head trauma on anti-coagulation (excluding ASA).  Patient cleared for CT by Dr. Randol. Pt transported to CT with trauma response nurse present to monitor. RN remained with the patient throughout their absence from the department for clinical observation.   GCS 15.  History   Past Medical History:  Diagnosis Date   Carotid artery occlusion    GERD (gastroesophageal reflux disease)    Hypertension    Stroke (HCC) 02/2021   TIA     Past Surgical History:  Procedure Laterality Date   CATARACT EXTRACTION, BILATERAL     ENDARTERECTOMY Left 09/16/2020   Procedure: LEFT ENDARTERECTOMY CAROTID;  Surgeon: Sheree Penne Bruckner, MD;  Location: [facility] [place];  Service: Vascular;  Laterality: Left;   HERNIA REPAIR     PACEMAKER IMPLANT     TONSILLECTOMY       Initial Focused Assessment (If applicable, or please see trauma documentation): Airway: Intact, Patent, no loose or missing teeth, no blood in mouth. Breathing: Breath sounds clear, equal bilaterally. Abrasion/bruising to mid upper sternum/clavicular region of chest.  Circulation: Approx 2cm laceration to mid-left forehead above nose and left eyebrow - bleeding controlled with gauze, small lac to bridge of nose - bleeding controlled. Abrasion to nose and bleeding to L nare. Abrasion to chin. Abrasion and bruising to mid upper chest. Pulses intact throughout.  Disability: C-collar in place. C/O neck pain and upper back pain by his shoulder blades. PERRLA.   CT's Completed:   CT Head, CT Maxillofacial, CT C-Spine, CT Chest w/ contrast, and CT abdomen/pelvis w/ contrast   Interventions:  Undressed and assessed thoroughly  18G PIV to R FA  Labs drawn CXR Pelvic XR Bilateral Knee XR CT pan scan    Plan for disposition:  Other Awaiting scan results  Consults completed:  none at 1745.  Event Summary: Pt BIB [facility] after sustaining head lacerations followed by neck and upper back pain due to a fall.  Pt is a resident at [facility] assisted [facility].  Pt reports he was attempting to get in the car when he tripped.  Pt hit head on pavement. Pt on Eliquis  and has a hx of multiple TIAs and strokes. No LOC.   Bedside handoff with ED RN [place] and [place].    LEBRON ROCKIE ORN  Trauma Response RN  Please call TRN at [phone] for further assistance.

## 2024-08-18 ENCOUNTER — Other Ambulatory Visit: Payer: Self-pay | Admitting: Family Medicine

## 2024-08-18 DIAGNOSIS — K219 Gastro-esophageal reflux disease without esophagitis: Secondary | ICD-10-CM

## 2024-08-19 ENCOUNTER — Telehealth: Payer: Self-pay | Admitting: Neurology

## 2024-08-19 NOTE — Telephone Encounter (Addendum)
 Patient's wife ask if Dr. Rosemarie could see patient this week. Patient had a fall on his face and had a CT Scan and was cracked vertebrae in his neck.  Informed patient would need a referral sent for Dr. Rosemarie to see him due to a new symptoms. Patient asking if patient can be seen this week.

## 2024-08-20 ENCOUNTER — Ambulatory Visit: Admitting: Family Medicine

## 2024-08-20 VITALS — BP 130/68 | HR 67 | Temp 97.9°F | Resp 16 | Ht 66.0 in | Wt 145.0 lb

## 2024-08-20 DIAGNOSIS — G629 Polyneuropathy, unspecified: Secondary | ICD-10-CM

## 2024-08-20 DIAGNOSIS — S12691D Other nondisplaced fracture of seventh cervical vertebra, subsequent encounter for fracture with routine healing: Secondary | ICD-10-CM

## 2024-08-20 DIAGNOSIS — S022XXD Fracture of nasal bones, subsequent encounter for fracture with routine healing: Secondary | ICD-10-CM | POA: Diagnosis not present

## 2024-08-20 DIAGNOSIS — S0993XD Unspecified injury of face, subsequent encounter: Secondary | ICD-10-CM

## 2024-08-20 DIAGNOSIS — W19XXXD Unspecified fall, subsequent encounter: Secondary | ICD-10-CM

## 2024-08-20 NOTE — Progress Notes (Unsigned)
 HPI:  [name] is a 83 y.o. male, who is here today to follow on recent OV/ED visit. Discussed the use of AI scribe software for clinical note transcription with the patient, who gave verbal consent to proceed.  History of Present Illness [name] is an 83 year old male with past medical history significant for***here today with his wife for ED follow-up. He was evaluated in the ED on 08/17/2024 after fall and facial trauma.  He experienced a fall resulting in multiple injuries, including a likely acute nondisplaced nasal bone fracture and a likely acute fracture of the C7 vertebra, as revealed by imaging studies.  There is also a chronic loss of height at the T9 vertebra.   His nose was bleeding for a couple of days post-fall but has since dried up. Despite a history of a deviated septum, he is able to breathe through his nostrils. His mouth is swollen, particularly on the left side, making it difficult to chew.  He experiences pain across the top of his shoulders and in the muscles from the upper chest to the neck. He describes numbness in his right arm when sitting in his recliner and notes that both arms can become numb when lying on his back with his head elevated. He also reports a trigger finger in his right hand.  He has a history of falls, with the most recent significant fall occurring about ten months ago. He recalls a previous fall on concrete about three years ago, which resulted in a broken nose.  His wife has questions about his history of his cardiologist, she wonders if he needs to see a specialist. Also***inquiring about treatments for neuropathy. He follows with neurologist. ***  Review of Systems  Constitutional:  Negative for activity change, appetite change and fever.  Respiratory:  Negative for cough.   Cardiovascular:  Negative for chest pain and palpitations.  Gastrointestinal:  Negative for abdominal pain and nausea.  Skin:  Negative for rash.   Neurological:  Negative for syncope and facial asymmetry.  Psychiatric/Behavioral:  Negative for confusion and hallucinations.   See other pertinent positives and negatives in HPI.  Current Outpatient Medications on File Prior to Visit  Medication Sig Dispense Refill   acetaminophen  (TYLENOL ) 325 MG tablet Take 2 tablets (650 mg total) by mouth every 6 (six) hours as needed. 36 tablet 0   albuterol  (VENTOLIN  HFA) 108 (90 Base) MCG/ACT inhaler Inhale 2 puffs into the lungs 4 (four) times daily as needed for wheezing or shortness of breath. 8 g 3   amLODipine  (NORVASC ) 5 MG tablet Take 1 tablet (5 mg total) by mouth daily. 90 tablet 2   ARNUITY ELLIPTA  200 MCG/ACT AEPB INHALE 1 PUFF INTO THE LUNGS EVERY MORNING. 90 each 3   atorvastatin  (LIPITOR) 40 MG tablet TAKE 1 TABLET BY MOUTH EVERY DAY 90 tablet 2   Calcium  250 MG CAPS Take 1 capsule by mouth daily.     cephALEXin (KEFLEX) 500 MG capsule Take 1 capsule (500 mg total) by mouth 2 (two) times daily for 7 days. 14 capsule 0   cholecalciferol  (VITAMIN D ) 25 MCG (1000 UNIT) tablet Take 1,000 Units by mouth at bedtime.     docusate sodium  (COLACE) 50 MG capsule Take 100 mg by mouth 2 (two) times daily.     ELIQUIS  5 MG TABS tablet TAKE 1 TABLET BY MOUTH TWICE A DAY 180 tablet 1   Ferrous Sulfate  (IRON) 325 (65 Fe) MG TABS Take 1  tablet by mouth at bedtime.     Magnesium  250 MG TABS Take 250 mg by mouth every morning.     Multiple Vitamin (MULTIVITAMIN WITH MINERALS) TABS tablet Take 1 tablet by mouth every morning.     Omega-3 Fatty Acids (FISH OIL PO) Take 1 capsule by mouth every morning.     omeprazole  (PRILOSEC) 40 MG capsule Take 1 capsule (40 mg total) by mouth 2 (two) times daily before a meal. 180 capsule 3   TIMOLOL  MALEATE OP Place 1 drop into both eyes in the morning. 1 drop into both eyes every morning     No current facility-administered medications on file prior to visit.    Past Medical History:  Diagnosis Date   Carotid  artery occlusion    GERD (gastroesophageal reflux disease)    Hypertension    Stroke (HCC) 02/2021   TIA   No Known Allergies  Social History   Socioeconomic History   Marital status: Married    Spouse name: Garment/textile technologist   Number of children: 2   Years of education: Not on file   Highest education level: Associate degree: academic program  Occupational History   Not on file  Tobacco Use   Smoking status: Former    Passive exposure: Never   Smokeless tobacco: Never  Vaping Use   Vaping status: Never Used  Substance and Sexual Activity   Alcohol use: Not Currently    Comment: occassional   Drug use: Never   Sexual activity: Not Currently  Other Topics Concern   Not on file  Social History Narrative   Lives with wife   Social Drivers of Health   Financial Resource Strain: Low Risk  (05/11/2023)   Overall Financial Resource Strain (CARDIA)    Difficulty of Paying Living Expenses: Not hard at all  Food Insecurity: No Food Insecurity (01/24/2024)   Hunger Vital Sign    Worried About Running Out of Food in the Last Year: Never true    Ran Out of Food in the Last Year: Never true  Transportation Needs: No Transportation Needs (01/24/2024)   PRAPARE - Administrator, Civil Service (Medical): No    Lack of Transportation (Non-Medical): No  Physical Activity: Sufficiently Active (05/11/2023)   Exercise Vital Sign    Days of Exercise per Week: 5 days    Minutes of Exercise per Session: 30 min  Recent Concern: Physical Activity - Insufficiently Active (05/11/2023)   Exercise Vital Sign    Days of Exercise per Week: 2 days    Minutes of Exercise per Session: 30 min  Stress: No Stress Concern Present (05/11/2023)   [name] of Occupational Health - Occupational Stress Questionnaire    Feeling of Stress : Not at all  Social Connections: Socially Integrated (01/24/2024)   Social Connection and Isolation Panel    Frequency of Communication with Friends and Family: More  than three times a week    Frequency of Social Gatherings with Friends and Family: More than three times a week    Attends Religious Services: More than 4 times per year    Active Member of [employer] or Organizations: Yes    Attends Banker Meetings: More than 4 times per year    Marital Status: Married    Vitals:   08/20/24 1427  BP: 130/68  Pulse: 67  Temp: 97.9 F (36.6 C)  SpO2: 99%   Body mass index is 23.4 kg/m.  Physical Exam Vitals and nursing note  reviewed.  Constitutional:      General: He is not in acute distress.    Appearance: He is well-developed.  HENT:     Head: Normocephalic and atraumatic.     Nose:     Right Nostril: No epistaxis.     Left Nostril: No epistaxis (No active bleeding but traces of blood around left nostril) or septal hematoma.     Mouth/Throat:     Mouth: Mucous membranes are moist.      Comments: Upper lip edema and left cheek.  Eyes:     Conjunctiva/sclera: Conjunctivae normal.  Neck:     Comments: Collar Pulmonary:     Effort: Pulmonary effort is normal. No respiratory distress.     Breath sounds: Normal breath sounds.  Abdominal:     Palpations: Abdomen is soft.     Tenderness: There is no abdominal tenderness.  Skin:    General: Skin is warm.     Findings: Abrasion and ecchymosis present. No erythema or rash.     Comments: Forehead laceration with 6 sutures. Mild edema, no erythema.  Neurological:     General: No focal deficit present.     Mental Status: He is alert and oriented to person, place, and time.     Comments: Wheel chair.  Psychiatric:        Mood and Affect: Mood and affect normal.    ASSESSMENT AND PLAN:  Madison was seen today for hospitalization follow-up.  Diagnoses and all orders for this visit:  Other closed nondisplaced fracture of seventh cervical vertebra with routine healing, subsequent encounter -     Ambulatory referral to Neurosurgery  Facial trauma, subsequent encounter  Closed  fracture of nasal bone with routine healing, subsequent encounter  Peripheral polyneuropathy   Orders Placed This Encounter  Procedures   Ambulatory referral to Neurosurgery    Peripheral neuropathy Will reviewed treatment options, which include duloxetine, gabapentin, and Lyrica.  We also discussed some side effects, which could include increased risk for falls. He is currently following with a neurologist.   Return in about 5 days (around 08/25/2024) for Suture removal 15 min appt..  Dayani Winbush G. [place], MD  [facility]. [place] office.

## 2024-08-20 NOTE — Patient Instructions (Signed)
 A few things to remember from today's visit:  Other closed nondisplaced fracture of seventh cervical vertebra with routine healing, subsequent encounter - Plan: Ambulatory referral to Neurosurgery  Facial trauma, subsequent encounter  Closed fracture of nasal bone with routine healing, subsequent encounter No changes today.  If you need refills for medications you take chronically, please call your pharmacy. Do not use My Chart to request refills or for acute issues that need immediate attention. If you send a my chart message, it may take a few days to be addressed, specially if I am not in the office.  Please be sure medication list is accurate. If a new problem present, please set up appointment sooner than planned today.

## 2024-08-20 NOTE — Assessment & Plan Note (Signed)
 Will reviewed treatment options, which include duloxetine, gabapentin, and Lyrica.  We also discussed some side effects, which could include increased risk for falls. He is currently following with a neurologist.

## 2024-08-21 ENCOUNTER — Encounter: Payer: Self-pay | Admitting: Family Medicine

## 2024-08-26 ENCOUNTER — Ambulatory Visit: Admitting: Family Medicine

## 2024-08-26 ENCOUNTER — Encounter: Payer: Self-pay | Admitting: Neurology

## 2024-08-26 ENCOUNTER — Encounter: Payer: Self-pay | Admitting: Family Medicine

## 2024-08-26 ENCOUNTER — Ambulatory Visit: Payer: Medicare Other | Admitting: Neurology

## 2024-08-26 VITALS — BP 120/70 | HR 90 | Temp 97.9°F | Resp 16 | Ht 66.0 in | Wt 145.0 lb

## 2024-08-26 DIAGNOSIS — R2681 Unsteadiness on feet: Secondary | ICD-10-CM

## 2024-08-26 DIAGNOSIS — S0181XD Laceration without foreign body of other part of head, subsequent encounter: Secondary | ICD-10-CM

## 2024-08-26 DIAGNOSIS — S12691D Other nondisplaced fracture of seventh cervical vertebra, subsequent encounter for fracture with routine healing: Secondary | ICD-10-CM

## 2024-08-26 NOTE — Progress Notes (Signed)
 Chief Complaint  Patient presents with   Suture / Staple Removal   HPI: [name] is a 83 y.o. male, who is here today with his wife for suture removal. He was seen on 08/20/24 for ED follow up.  He experienced a fall on 08/17/24 resulting in multiple injuries: Acute nondisplaced nasal bone fracture and acute fracture of the C7 vertebra, as revealed by imaging studies.  Negative for nose pain or nasal congestion.  Neck pain has improved. He is not longer wearing aspen collar. He received appt information with neurosurgeon, a month from now. His wife asked if he needed to wear collar and was told that more than a week, based on imaging, will not make a difference, so he removed. Negative for fever,chills, Ue's pain or numbness. Planning on resuming PT. He is using his walker and back to baseline.   Review of Systems  Constitutional:  Negative for activity change, appetite change, chills and fever.  HENT:  Negative for facial swelling and sore throat.   Respiratory:  Negative for cough, shortness of breath and wheezing.   Cardiovascular:  Negative for chest pain and palpitations.  Gastrointestinal:  Negative for abdominal pain and nausea.  Neurological:  Negative for syncope, weakness and headaches.  Psychiatric/Behavioral:  Negative for confusion.   See other pertinent positives and negatives in HPI.  Current Outpatient Medications on File Prior to Visit  Medication Sig Dispense Refill   acetaminophen  (TYLENOL ) 325 MG tablet Take 2 tablets (650 mg total) by mouth every 6 (six) hours as needed. 36 tablet 0   albuterol  (VENTOLIN  HFA) 108 (90 Base) MCG/ACT inhaler Inhale 2 puffs into the lungs 4 (four) times daily as needed for wheezing or shortness of breath. 8 g 3   amLODipine  (NORVASC ) 5 MG tablet Take 1 tablet (5 mg total) by mouth daily. 90 tablet 2   ARNUITY ELLIPTA  200 MCG/ACT AEPB INHALE 1 PUFF INTO THE LUNGS EVERY MORNING. 90 each 3   atorvastatin  (LIPITOR) 40 MG tablet  TAKE 1 TABLET BY MOUTH EVERY DAY 90 tablet 2   Calcium  250 MG CAPS Take 1 capsule by mouth daily.     cholecalciferol  (VITAMIN D ) 25 MCG (1000 UNIT) tablet Take 1,000 Units by mouth at bedtime.     docusate sodium  (COLACE) 50 MG capsule Take 100 mg by mouth 2 (two) times daily.     ELIQUIS  5 MG TABS tablet TAKE 1 TABLET BY MOUTH TWICE A DAY 180 tablet 1   Ferrous Sulfate  (IRON) 325 (65 Fe) MG TABS Take 1 tablet by mouth at bedtime.     Magnesium  250 MG TABS Take 250 mg by mouth every morning.     Multiple Vitamin (MULTIVITAMIN WITH MINERALS) TABS tablet Take 1 tablet by mouth every morning.     Omega-3 Fatty Acids (FISH OIL PO) Take 1 capsule by mouth every morning.     omeprazole  (PRILOSEC) 40 MG capsule Take 1 capsule (40 mg total) by mouth 2 (two) times daily before a meal. 180 capsule 3   TIMOLOL  MALEATE OP Place 1 drop into both eyes in the morning. 1 drop into both eyes every morning     No current facility-administered medications on file prior to visit.    Past Medical History:  Diagnosis Date   Carotid artery occlusion    GERD (gastroesophageal reflux disease)    Hypertension    Stroke (HCC) 02/2021   TIA   No Known Allergies  Social History   Socioeconomic History  Marital status: Married    Spouse name: Bruna   Number of children: 2   Years of education: Not on file   Highest education level: Associate degree: academic program  Occupational History   Not on file  Tobacco Use   Smoking status: Former    Passive exposure: Never   Smokeless tobacco: Never  Vaping Use   Vaping status: Never Used  Substance and Sexual Activity   Alcohol use: Not Currently    Comment: occassional   Drug use: Never   Sexual activity: Not Currently  Other Topics Concern   Not on file  Social History Narrative   Lives with wife   Social Drivers of Health   Financial Resource Strain: Low Risk  (05/11/2023)   Overall Financial Resource Strain (CARDIA)    Difficulty of Paying  Living Expenses: Not hard at all  Food Insecurity: No Food Insecurity (01/24/2024)   Hunger Vital Sign    Worried About Running Out of Food in the Last Year: Never true    Ran Out of Food in the Last Year: Never true  Transportation Needs: No Transportation Needs (01/24/2024)   PRAPARE - Administrator, Civil Service (Medical): No    Lack of Transportation (Non-Medical): No  Physical Activity: Sufficiently Active (05/11/2023)   Exercise Vital Sign    Days of Exercise per Week: 5 days    Minutes of Exercise per Session: 30 min  Recent Concern: Physical Activity - Insufficiently Active (05/11/2023)   Exercise Vital Sign    Days of Exercise per Week: 2 days    Minutes of Exercise per Session: 30 min  Stress: No Stress Concern Present (05/11/2023)   [name] of Occupational Health - Occupational Stress Questionnaire    Feeling of Stress : Not at all  Social Connections: Socially Integrated (01/24/2024)   Social Connection and Isolation Panel    Frequency of Communication with Friends and Family: More than three times a week    Frequency of Social Gatherings with Friends and Family: More than three times a week    Attends Religious Services: More than 4 times per year    Active Member of [employer] or Organizations: Yes    Attends Banker Meetings: More than 4 times per year    Marital Status: Married    Vitals:   08/26/24 1043  BP: 120/70  Pulse: 90  Resp: 16  Temp: 97.9 F (36.6 C)  SpO2: 95%   Body mass index is 23.4 kg/m.  Physical Exam Vitals and nursing note reviewed.  Constitutional:      Appearance: Normal appearance.  HENT:     Head: Normocephalic and atraumatic.  Eyes:     Conjunctiva/sclera: Conjunctivae normal.  Cardiovascular:     Rate and Rhythm: Normal rate and regular rhythm.     Heart sounds: No murmur heard. Pulmonary:     Effort: Pulmonary effort is normal. No respiratory distress.     Breath sounds: Normal breath sounds.   Skin:    Findings: Abrasion and ecchymosis present.     Comments: Forehead with wound healing well, 6 stitches. Clear crust covering area of wound and sutures imbedded in some. No edema or erythema.  Neurological:     Mental Status: He is alert and oriented to person, place, and time.     Cranial Nerves: No cranial nerve deficit.     Comments: Gait is unstable, assisted with a walker.  Psychiatric:  Mood and Affect: Mood and affect normal.   ASSESSMENT AND PLAN:  [name] was seen today for suture / staple removal.  Diagnoses and all orders for this visit:  Laceration of forehead, subsequent encounter Healing well, no signs of infection. Removed 6 interrupted sutures, tolerated procedure well. Keep wound clean with soap and water. Direct UV exposure to be avoided, hat and sunscreen recommended.  Other closed nondisplaced fracture of seventh cervical vertebra with routine healing, subsequent encounter  Not longer wearing Aspen collar as instructed by nurse at the neurosurgeons' office. Monitor for worsening pain or new symptoms.  Unstable gait At high risk for falls. We discussed fall precautions. Planning on resuming PT Friday.  Return if symptoms worsen or fail to improve.  Jago Carton G. [place], MD  [facility]. [place] office.

## 2024-08-27 DIAGNOSIS — Z0289 Encounter for other administrative examinations: Secondary | ICD-10-CM

## 2024-09-22 ENCOUNTER — Ambulatory Visit

## 2024-09-22 VITALS — BP 120/62 | HR 60 | Temp 98.4°F | Ht 65.5 in | Wt 147.2 lb

## 2024-09-22 DIAGNOSIS — Z0000 Encounter for general adult medical examination without abnormal findings: Secondary | ICD-10-CM

## 2024-09-22 NOTE — Patient Instructions (Addendum)
 [name],  Thank you for taking the time for your Medicare Wellness Visit. I appreciate your continued commitment to your health goals. Please review the care plan we discussed, and feel free to reach out if I can assist you further.  Please note that Annual Wellness Visits do not include a physical exam. Some assessments may be limited, especially if the visit was conducted virtually. If needed, we may recommend an in-person follow-up with your provider.  Ongoing Care Seeing your primary care provider every 3 to 6 months helps us  monitor your health and provide consistent, personalized care.   Referrals If a referral was made during today's visit and you haven't received any updates within two weeks, please contact the referred provider directly to check on the status.  Recommended Screenings:  Health Maintenance  Topic Date Due   COVID-19 Vaccine (6 - 2025-26 season) 07/14/2024   Medicare Annual Wellness Visit  09/22/2025   DTaP/Tdap/Td vaccine (4 - Td or Tdap) 08/17/2034   Pneumococcal Vaccine for age over 33  Completed   Flu Shot  Completed   Zoster (Shingles) Vaccine  Completed   Meningitis B Vaccine  Aged Out       09/22/2024   11:05 AM  Advanced Directives  Does Patient Have a Medical Advance Directive? Yes  Type of Estate Agent of [place];Living will    Vision: Annual vision screenings are recommended for early detection of glaucoma, cataracts, and diabetic retinopathy. These exams can also reveal signs of chronic conditions such as diabetes and high blood pressure.  Dental: Annual dental screenings help detect early signs of oral cancer, gum disease, and other conditions linked to overall health, including heart disease and diabetes.  Please see the attached documents for additional preventive care recommendations.

## 2024-09-22 NOTE — Progress Notes (Addendum)
 Subjective:   [name] is a 83 y.o. male who presents for a Welcome to Medicare Exam.   Allergies (verified) Patient has no known allergies.   History: Past Medical History:  Diagnosis Date   Carotid artery occlusion    GERD (gastroesophageal reflux disease)    Hypertension    Stroke (HCC) 02/2021   TIA   Past Surgical History:  Procedure Laterality Date   CATARACT EXTRACTION, BILATERAL     ENDARTERECTOMY Left 09/16/2020   Procedure: LEFT ENDARTERECTOMY CAROTID;  Surgeon: Sheree Penne Bruckner, MD;  Location: [facility] OR;  Service: Vascular;  Laterality: Left;   HERNIA REPAIR     PACEMAKER IMPLANT     TONSILLECTOMY     Family History  Family history unknown: Yes   Social History   Occupational History   Not on file  Tobacco Use   Smoking status: Former    Passive exposure: Never   Smokeless tobacco: Never  Vaping Use   Vaping status: Never Used  Substance and Sexual Activity   Alcohol use: Not Currently    Comment: occassional   Drug use: Never   Sexual activity: Not Currently   Tobacco Counseling Counseling given: Not Answered  SDOH Screenings   Food Insecurity: No Food Insecurity (09/22/2024)  Housing: Unknown (09/22/2024)  Transportation Needs: No Transportation Needs (09/22/2024)  Utilities: Not At Risk (09/22/2024)  Alcohol Screen: Low Risk  (05/11/2023)  Depression (PHQ2-9): Low Risk  (09/22/2024)  Financial Resource Strain: Low Risk  (05/11/2023)  Physical Activity: Sufficiently Active (09/22/2024)  Social Connections: Moderately Integrated (09/22/2024)  Stress: No Stress Concern Present (09/22/2024)  Tobacco Use: Medium Risk (09/22/2024)  Health Literacy: Adequate Health Literacy (09/22/2024)   Depression Screen    09/22/2024   11:14 AM 08/21/2024    3:55 PM 05/11/2023    1:48 PM 05/09/2022    1:32 PM 05/03/2021   12:13 PM 10/21/2020   11:57 AM  PHQ 2/9 Scores  PHQ - 2 Score 0 0 0 0 0 0     Goals Addressed               This  Visit's Progress     Remain active (pt-stated)        Get back to eating regular meals.       Visit info / Clinical Intake: Medicare Wellness Visit Type:: Subsequent Annual Wellness Visit Persons participating in visit:: patient Medicare Wellness Visit Mode:: In-person (required for WTM) Information given by:: patient Interpreter Needed?: No Pre-visit prep was completed: no AWV questionnaire completed by patient prior to visit?: no Living arrangements:: lives with spouse/significant other Patient's Overall Health Status Rating: good Typical amount of pain: none Does pain affect daily life?: no Are you currently prescribed opioids?: no  Dietary Habits and Nutritional Risks How many meals a day?: 3 Eats fruit and vegetables daily?: yes Most meals are obtained by: preparing own meals In the last 2 weeks, have you had any of the following?: none Diabetic:: no  Functional Status Activities of Daily Living (to include ambulation/medication): Independent Ambulation: Independent with device- listed below Home Assistive Devices/Equipment: Eyeglasses; Walker (specify Type); Other (Comment) (Rollator walker and Hearing Aids) Medication Administration: Independent Home Management: Independent Manage your own finances?: yes Primary transportation is: family/friends; facility / other Concerns about vision?: no *vision screening is required for WTM* Concerns about hearing?: (!) yes Uses hearing aids?: (!) yes Hear whispered voice?: yes  Fall Screening Falls in the past year?: 1 Number of falls in past year:  0 Was there an injury with Fall?: 1 (Skin tears to face and knees. Followed by medical attention) Fall Risk Category Calculator: 2 Patient Fall Risk Level: Moderate Fall Risk  Fall Risk Patient at Risk for Falls Due to: History of fall(s); Impaired balance/gait Fall risk Follow up: Education provided; Falls prevention discussed  Home and Transportation Safety: All rugs have  non-skid backing?: N/A, no rugs All stairs or steps have railings?: yes Grab bars in the bathtub or shower?: yes Have non-skid surface in bathtub or shower?: yes Good home lighting?: yes Regular seat belt use?: yes Hospital stays in the last year:: no  Cognitive Assessment Difficulty concentrating, remembering, or making decisions? : no Will 6CIT or Mini Cog be Completed: yes  Advance Directives (For Healthcare) Does Patient Have a Medical Advance Directive?: Yes Does patient want to make changes to medical advance directive?: No - Patient declined Type of Advance Directive: Healthcare Power of [place]; Living will Copy of Healthcare Power of Attorney in Chart?: No - copy requested Copy of Living Will in Chart?: No - copy requested Would patient like information on creating a medical advance directive?: No - Patient declined         Objective:    Today's Vitals   09/22/24 1051  BP: 120/62  Pulse: 60  Temp: 98.4 F (36.9 C)  TempSrc: Oral  SpO2: 96%  Weight: 147 lb 3.2 oz (66.8 kg)  Height: 5' 5.5 (1.664 m)   Body mass index is 24.12 kg/m.   Physical Exam   Current Medications (verified) Outpatient Encounter Medications as of 09/22/2024  Medication Sig   acetaminophen  (TYLENOL ) 325 MG tablet Take 2 tablets (650 mg total) by mouth every 6 (six) hours as needed.   albuterol  (VENTOLIN  HFA) 108 (90 Base) MCG/ACT inhaler Inhale 2 puffs into the lungs 4 (four) times daily as needed for wheezing or shortness of breath.   amLODipine  (NORVASC ) 5 MG tablet Take 1 tablet (5 mg total) by mouth daily.   ARNUITY ELLIPTA  200 MCG/ACT AEPB INHALE 1 PUFF INTO THE LUNGS EVERY MORNING.   atorvastatin  (LIPITOR) 40 MG tablet TAKE 1 TABLET BY MOUTH EVERY DAY   Calcium  250 MG CAPS Take 1 capsule by mouth daily.   cholecalciferol  (VITAMIN D ) 25 MCG (1000 UNIT) tablet Take 1,000 Units by mouth at bedtime.   docusate sodium  (COLACE) 50 MG capsule Take 100 mg by mouth 2 (two) times daily.    ELIQUIS  5 MG TABS tablet TAKE 1 TABLET BY MOUTH TWICE A DAY   Ferrous Sulfate  (IRON) 325 (65 Fe) MG TABS Take 1 tablet by mouth at bedtime.   Magnesium  250 MG TABS Take 250 mg by mouth every morning.   Multiple Vitamin (MULTIVITAMIN WITH MINERALS) TABS tablet Take 1 tablet by mouth every morning.   Omega-3 Fatty Acids (FISH OIL PO) Take 1 capsule by mouth every morning.   omeprazole  (PRILOSEC) 40 MG capsule Take 1 capsule (40 mg total) by mouth 2 (two) times daily before a meal.   TIMOLOL  MALEATE OP Place 1 drop into both eyes in the morning. 1 drop into both eyes every morning   No facility-administered encounter medications on file as of 09/22/2024.   Hearing/Vision screen Hearing Screening - Comments:: Wears Hearing Aids Vision Screening - Comments:: Wears rx glasses - up to date with routine eye exams with  Deferred Immunizations and Health Maintenance Health Maintenance  Topic Date Due   COVID-19 Vaccine (6 - 2025-26 season) 07/14/2024   Medicare Annual Wellness (AWV)  09/22/2025   DTaP/Tdap/Td (4 - Td or Tdap) 08/17/2034   Pneumococcal Vaccine: 50+ Years  Completed   Influenza Vaccine  Completed   Zoster Vaccines- Shingrix  Completed   Meningococcal B Vaccine  Aged Out        Assessment/Plan:  This is a routine wellness examination for [name].  Patient Care Team: Jordan, Betty G, MD as PCP - General (Family Medicine) Cindie Ole DASEN, MD as PCP - Electrophysiology (Cardiology) [place], Jon DEL, [facility] (Pharmacist)  I have personally reviewed and noted the following in the patient's chart:   Medical and social history Use of alcohol, tobacco or illicit drugs  Current medications and supplements including opioid prescriptions. Functional ability and status Nutritional status Physical activity Advanced directives List of other physicians Hospitalizations, surgeries, and ER visits in previous 12 months Vitals Screenings to include cognitive, depression, and  falls Referrals and appointments  No orders of the defined types were placed in this encounter.  In addition, I have reviewed and discussed with patient certain preventive protocols, quality metrics, and best practice recommendations. A written personalized care plan for preventive services as well as general preventive health recommendations were provided to patient.   Rojelio LELON Blush, LPN   [id number]/[id number]   Return in 1 year (on 09/22/2025).

## 2024-09-23 ENCOUNTER — Ambulatory Visit: Payer: Medicare Other

## 2024-10-07 ENCOUNTER — Ambulatory Visit

## 2024-10-07 DIAGNOSIS — G459 Transient cerebral ischemic attack, unspecified: Secondary | ICD-10-CM

## 2024-10-08 LAB — CUP PACEART REMOTE DEVICE CHECK
Battery Remaining Longevity: 18 mo
Battery Remaining Percentage: 15 %
Battery Voltage: 2.86 V
Brady Statistic AP VP Percent: 2.5 %
Brady Statistic AP VS Percent: 96 %
Brady Statistic AS VP Percent: 1 %
Brady Statistic AS VS Percent: 1.7 %
Brady Statistic RA Percent Paced: 97 %
Brady Statistic RV Percent Paced: 2.5 %
Date Time Interrogation Session: 20251125020015
Implantable Lead Connection Status: 753985
Implantable Lead Connection Status: 753985
Implantable Lead Implant Date: 20160928
Implantable Lead Implant Date: 20160928
Implantable Lead Location: 753859
Implantable Lead Location: 753860
Implantable Lead Model: 1948
Implantable Pulse Generator Implant Date: 20160928
Lead Channel Impedance Value: 400 Ohm
Lead Channel Impedance Value: 540 Ohm
Lead Channel Pacing Threshold Amplitude: 0.75 V
Lead Channel Pacing Threshold Amplitude: 1 V
Lead Channel Pacing Threshold Pulse Width: 0.4 ms
Lead Channel Pacing Threshold Pulse Width: 0.4 ms
Lead Channel Sensing Intrinsic Amplitude: 1.8 mV
Lead Channel Sensing Intrinsic Amplitude: 3.3 mV
Lead Channel Setting Pacing Amplitude: 1.25 V
Lead Channel Setting Pacing Amplitude: 1.75 V
Lead Channel Setting Pacing Pulse Width: 0.4 ms
Lead Channel Setting Sensing Sensitivity: 0.7 mV
Pulse Gen Model: 2240
Pulse Gen Serial Number: 7817736

## 2024-10-10 NOTE — Progress Notes (Signed)
 Remote PPM Transmission

## 2024-10-17 ENCOUNTER — Ambulatory Visit: Payer: Self-pay | Admitting: Cardiology

## 2024-10-24 ENCOUNTER — Ambulatory Visit: Admitting: Family Medicine

## 2024-10-24 ENCOUNTER — Encounter: Payer: Self-pay | Admitting: Family Medicine

## 2024-10-24 VITALS — BP 128/70 | HR 81 | Temp 97.9°F | Resp 16 | Ht 66.0 in | Wt 149.0 lb

## 2024-10-24 DIAGNOSIS — J4520 Mild intermittent asthma, uncomplicated: Secondary | ICD-10-CM

## 2024-10-24 DIAGNOSIS — G8929 Other chronic pain: Secondary | ICD-10-CM

## 2024-10-24 DIAGNOSIS — R0981 Nasal congestion: Secondary | ICD-10-CM | POA: Diagnosis not present

## 2024-10-24 DIAGNOSIS — I10 Essential (primary) hypertension: Secondary | ICD-10-CM

## 2024-10-24 DIAGNOSIS — R1310 Dysphagia, unspecified: Secondary | ICD-10-CM | POA: Diagnosis not present

## 2024-10-24 DIAGNOSIS — K219 Gastro-esophageal reflux disease without esophagitis: Secondary | ICD-10-CM | POA: Diagnosis not present

## 2024-10-24 DIAGNOSIS — M545 Low back pain, unspecified: Secondary | ICD-10-CM | POA: Diagnosis not present

## 2024-10-24 MED ORDER — IPRATROPIUM BROMIDE 0.06 % NA SOLN: 2.0000 | Freq: Three times a day (TID) | NASAL | 1 refills | Status: AC | PRN

## 2024-10-24 NOTE — Progress Notes (Unsigned)
 ACUTE VISIT No chief complaint on file.  HPI: [name] is a 83 y.o. male, who is here today complaining of *** Discussed the use of AI scribe software for clinical note transcription with the patient, who gave verbal consent to proceed.  History of Present Illness [name] is an 83 year old male with dysphagia and a large hiatal hernia who presents with ongoing swallowing difficulties and chest discomfort.  He experiences a sensation of blockage in the chest area, particularly after consuming certain foods like rice and cold liquids. This sensation sometimes radiates to his shoulder and around his pacemaker. He describes it as a 'sensation' rather than pain, which can be alleviated by sipping warm liquids such as tea. The symptoms have been present for several months and are more prevalent in the afternoon and evening.  A previous swallowing study showed a slight delay in the passage of food into the stomach. He takes an additional dose of his morning medication in the afternoon, but it has not fully resolved the issue. He underwent esophageal dilation approximately six years ago.  His blood pressure tends to elevate during episodes of discomfort, with readings as high as 160/83, which later decrease to 143/73 after the discomfort subsides. He monitors his blood pressure regularly and takes amlodipine  5 mg daily.  He experiences occasional regurgitation of liquid into his throat, causing coughing. He avoids cold liquids and certain foods to manage his symptoms.  He has a history of scoliosis and engages in regular exercise, which he finds beneficial for managing pain associated with scoliosis. He also reports a history of nasal obstruction, particularly at night, which he attributes to a deviated septum from past trauma. He often breathes through his mouth, especially when watching TV or at rest.  He uses an inhaler once daily in the morning for his lungs and has a history of  sleep apnea, which he manages by sleeping with a wedge pillow to elevate his head.  Review of Systems  Constitutional:  Negative for activity change, appetite change, chills and fever.  HENT:  Negative for sore throat.   Gastrointestinal:  Negative for abdominal pain, nausea and vomiting.  Genitourinary:  Negative for decreased urine volume, dysuria and hematuria.  Skin:  Negative for rash.  See other pertinent positives and negatives in HPI.  Medications Ordered Prior to Encounter[1]  Past Medical History:  Diagnosis Date   Carotid artery occlusion    GERD (gastroesophageal reflux disease)    Hypertension    Stroke (HCC) 02/2021   TIA   Allergies[2]  Social History   Socioeconomic History   Marital status: Married    Spouse name: Garment/textile Technologist   Number of children: 2   Years of education: Not on file   Highest education level: Associate degree: academic program  Occupational History   Not on file  Tobacco Use   Smoking status: Former    Passive exposure: Never   Smokeless tobacco: Never  Vaping Use   Vaping status: Never Used  Substance and Sexual Activity   Alcohol use: Not Currently    Comment: occassional   Drug use: Never   Sexual activity: Not Currently  Other Topics Concern   Not on file  Social History Narrative   Lives with wife   Social Drivers of Health   Tobacco Use: Medium Risk (10/24/2024)   Patient History    Smoking Tobacco Use: Former    Smokeless Tobacco Use: Never    Passive Exposure: Never  Financial Resource Strain: Low Risk (05/11/2023)   Overall Financial Resource Strain (CARDIA)    Difficulty of Paying Living Expenses: Not hard at all  Food Insecurity: No Food Insecurity (09/22/2024)   Epic    Worried About Programme Researcher, Broadcasting/film/video in the Last Year: Never true    Ran Out of Food in the Last Year: Never true  Transportation Needs: No Transportation Needs (09/22/2024)   Epic    Lack of Transportation (Medical): No    Lack of Transportation  (Non-Medical): No  Physical Activity: Sufficiently Active (09/22/2024)   Exercise Vital Sign    Days of Exercise per Week: 3 days    Minutes of Exercise per Session: 50 min  Stress: No Stress Concern Present (09/22/2024)   [name] of Occupational Health - Occupational Stress Questionnaire    Feeling of Stress: Not at all  Social Connections: Moderately Integrated (09/22/2024)   Social Connection and Isolation Panel    Frequency of Communication with Friends and Family: More than three times a week    Frequency of Social Gatherings with Friends and Family: More than three times a week    Attends Religious Services: Never    Database Administrator or Organizations: No    Attends Engineer, Structural: More than 4 times per year    Marital Status: Married  Depression (PHQ2-9): Low Risk (09/22/2024)   Depression (PHQ2-9)    PHQ-2 Score: 0  Alcohol Screen: Low Risk (05/11/2023)   Alcohol Screen    Last Alcohol Screening Score (AUDIT): 1  Housing: Unknown (09/22/2024)   Epic    Unable to Pay for Housing in the Last Year: No    Number of Times Moved in the Last Year: Not on file    Homeless in the Last Year: No  Utilities: Not At Risk (09/22/2024)   Epic    Threatened with loss of utilities: No  Health Literacy: Adequate Health Literacy (09/22/2024)   B1300 Health Literacy    Frequency of need for help with medical instructions: Never    Vitals:   10/24/24 1301  BP: 128/70  Pulse: 81  Resp: 16  Temp: 97.9 F (36.6 C)  SpO2: 96%   Body mass index is 24.05 kg/m.  Physical Exam Vitals and nursing note reviewed.  Constitutional:      General: He is not in acute distress.    Appearance: He is well-developed.  HENT:     Head: Normocephalic and atraumatic.     Mouth/Throat:     Pharynx: Uvula midline.  Eyes:     Conjunctiva/sclera: Conjunctivae normal.  Cardiovascular:     Rate and Rhythm: Normal rate and regular rhythm.     Heart sounds: No murmur  heard. Pulmonary:     Effort: Pulmonary effort is normal. No respiratory distress.     Breath sounds: Normal breath sounds.  Abdominal:     Palpations: Abdomen is soft. There is no mass.     Tenderness: There is no abdominal tenderness.  Skin:    General: Skin is warm.     Findings: No erythema or rash.  Neurological:     General: No focal deficit present.     Mental Status: He is alert and oriented to person, place, and time.     Comments: Unstable gait assisted with a walker.  Psychiatric:        Mood and Affect: Mood and affect normal.   ASSESSMENT AND PLAN:  Diagnoses and all orders for this visit:  Dysphagia, unspecified type  Nasal congestion -     ipratropium (ATROVENT ) 0.06 % nasal spray; Place 2 sprays into both nostrils 3 (three) times daily between meals as needed for rhinitis.  Essential hypertension  Chronic bilateral low back pain without sciatica    No orders of the defined types were placed in this encounter.   No problem-specific Assessment & Plan notes found for this encounter.    Return if symptoms worsen or fail to improve, for keep next appointment.  Leighla Chestnutt G. Teiara Baria, MD  [facility]. [place] office.     [1]  Current Outpatient Medications on File Prior to Visit  Medication Sig Dispense Refill   acetaminophen  (TYLENOL ) 325 MG tablet Take 2 tablets (650 mg total) by mouth every 6 (six) hours as needed. 36 tablet 0   albuterol  (VENTOLIN  HFA) 108 (90 Base) MCG/ACT inhaler Inhale 2 puffs into the lungs 4 (four) times daily as needed for wheezing or shortness of breath. 8 g 3   amLODipine  (NORVASC ) 5 MG tablet Take 1 tablet (5 mg total) by mouth daily. 90 tablet 2   ARNUITY ELLIPTA  200 MCG/ACT AEPB INHALE 1 PUFF INTO THE LUNGS EVERY MORNING. 90 each 3   atorvastatin  (LIPITOR) 40 MG tablet TAKE 1 TABLET BY MOUTH EVERY DAY 90 tablet 2   Calcium  250 MG CAPS Take 1 capsule by mouth daily.     cholecalciferol  (VITAMIN D ) 25 MCG (1000 UNIT)  tablet Take 1,000 Units by mouth at bedtime.     docusate sodium  (COLACE) 50 MG capsule Take 100 mg by mouth 2 (two) times daily.     ELIQUIS  5 MG TABS tablet TAKE 1 TABLET BY MOUTH TWICE A DAY 180 tablet 1   Ferrous Sulfate  (IRON) 325 (65 Fe) MG TABS Take 1 tablet by mouth at bedtime.     Magnesium  250 MG TABS Take 250 mg by mouth every morning.     Multiple Vitamin (MULTIVITAMIN WITH MINERALS) TABS tablet Take 1 tablet by mouth every morning.     Omega-3 Fatty Acids (FISH OIL PO) Take 1 capsule by mouth every morning.     omeprazole  (PRILOSEC) 40 MG capsule Take 1 capsule (40 mg total) by mouth 2 (two) times daily before a meal. 180 capsule 3   TIMOLOL  MALEATE OP Place 1 drop into both eyes in the morning. 1 drop into both eyes every morning     No current facility-administered medications on file prior to visit.  [2] No Known Allergies

## 2024-10-24 NOTE — Patient Instructions (Addendum)
 A few things to remember from today's visit:  Dysphagia, unspecified type  Nasal congestion - Plan: ipratropium (ATROVENT) 0.06 % nasal spray  Essential hypertension Please arrange appt with gastro. Try Atrovent for nasal congestion and saline irrigations through the day as needed. Avoid foods that can aggravate problem.  If you need refills for medications you take chronically, please call your pharmacy. Do not use My Chart to request refills or for acute issues that need immediate attention. If you send a my chart message, it may take a few days to be addressed, specially if I am not in the office.  Please be sure medication list is accurate. If a new problem present, please set up appointment sooner than planned today.

## 2024-10-25 NOTE — Assessment & Plan Note (Signed)
 BP elevated during recent episode of chest discomfort associated with food intake. In general BP is adequately controlled. Continue Amlodipine  5 mg daily and low salt diet. Continue monitoring BP regularly.

## 2024-10-25 NOTE — Assessment & Plan Note (Signed)
 With associated large hiatal hernia. Not well controlled, has acid reflux, a;though not as frequent. Continue Omeprazole  40 mg bid and GERD precautions. Recommend arranging follow up with GI.

## 2024-10-25 NOTE — Assessment & Plan Note (Signed)
 Reports that problem is well-controlled. Continue Arnuity Ellipta  200 mcg 1 puff daily and albuterol  inhaler 1 to 2 puff 4 times daily as needed.

## 2024-10-25 NOTE — Assessment & Plan Note (Signed)
 Severe scoliosis. Regular exercise and PT have helped. Fall precautions discussed.

## 2024-10-28 ENCOUNTER — Other Ambulatory Visit: Payer: Self-pay | Admitting: Family Medicine

## 2024-10-28 DIAGNOSIS — R600 Localized edema: Secondary | ICD-10-CM

## 2024-10-28 DIAGNOSIS — I5032 Chronic diastolic (congestive) heart failure: Secondary | ICD-10-CM

## 2024-10-28 DIAGNOSIS — J4521 Mild intermittent asthma with (acute) exacerbation: Secondary | ICD-10-CM

## 2024-10-30 ENCOUNTER — Other Ambulatory Visit: Payer: Self-pay | Admitting: Family Medicine

## 2024-10-30 NOTE — Telephone Encounter (Unsigned)
 Copied from CRM #[id number]. Topic: Clinical - Medication Refill >> Oct 30, 2024 12:05 PM Victoria A wrote: Medication: atorvastatin  (LIPITOR) 40 MG tablet  Has the patient contacted their pharmacy? Yes (Agent: If no, request that the patient contact the pharmacy for the refill. If patient does not wish to contact the pharmacy document the reason why and proceed with request.) (Agent: If yes, when and what did the pharmacy advise?) Patient needs new prescription  This is the patient's preferred pharmacy:  [id number] - [place], [place] - [address] AT [address] OF [facility] [address] [place] [address] Phone: [phone] Fax: [phone]  Is this the correct pharmacy for this prescription? Yes If no, delete pharmacy and type the correct one.   Has the prescription been filled recently? No  Is the patient out of the medication? No has 3 days worth  Has the patient been seen for an appointment in the last year OR does the patient have an upcoming appointment? Yes  Can we respond through MyChart? Yes  Agent: Please be advised that Rx refills may take up to 3 business days. We ask that you follow-up with your pharmacy.

## 2024-10-31 MED ORDER — ATORVASTATIN CALCIUM 40 MG PO TABS: 40.0000 mg | ORAL_TABLET | Freq: Every day | ORAL | 2 refills | Status: AC

## 2024-11-12 ENCOUNTER — Other Ambulatory Visit: Payer: Self-pay | Admitting: Family Medicine

## 2024-11-12 DIAGNOSIS — I10 Essential (primary) hypertension: Secondary | ICD-10-CM

## 2024-12-23 ENCOUNTER — Ambulatory Visit: Payer: Medicare Other

## 2025-01-06 ENCOUNTER — Ambulatory Visit

## 2025-01-16 ENCOUNTER — Ambulatory Visit: Admitting: Cardiology

## 2025-04-07 ENCOUNTER — Ambulatory Visit

## 2025-07-07 ENCOUNTER — Ambulatory Visit

## 2025-07-13 ENCOUNTER — Ambulatory Visit: Admitting: Neurology
